# Patient Record
Sex: Male | Born: 1970 | Race: Black or African American | Hispanic: No | Marital: Married | Smoking: Never smoker
Health system: Southern US, Community
[De-identification: ages and names within clinical notes are randomized; demographics above are authoritative.]

## PROBLEM LIST (undated history)

## (undated) DIAGNOSIS — E669 Obesity, unspecified: Secondary | ICD-10-CM

## (undated) DIAGNOSIS — N529 Male erectile dysfunction, unspecified: Secondary | ICD-10-CM

## (undated) DIAGNOSIS — IMO0002 Reserved for concepts with insufficient information to code with codable children: Secondary | ICD-10-CM

## (undated) DIAGNOSIS — I340 Nonrheumatic mitral (valve) insufficiency: Secondary | ICD-10-CM

## (undated) DIAGNOSIS — Z9119 Patient's noncompliance with other medical treatment and regimen: Secondary | ICD-10-CM

## (undated) DIAGNOSIS — R0789 Other chest pain: Secondary | ICD-10-CM

## (undated) DIAGNOSIS — E1165 Type 2 diabetes mellitus with hyperglycemia: Secondary | ICD-10-CM

## (undated) DIAGNOSIS — I4891 Unspecified atrial fibrillation: Secondary | ICD-10-CM

## (undated) DIAGNOSIS — G4733 Obstructive sleep apnea (adult) (pediatric): Secondary | ICD-10-CM

## (undated) DIAGNOSIS — Z7901 Long term (current) use of anticoagulants: Secondary | ICD-10-CM

## (undated) DIAGNOSIS — I10 Essential (primary) hypertension: Secondary | ICD-10-CM

## (undated) DIAGNOSIS — E0590 Thyrotoxicosis, unspecified without thyrotoxic crisis or storm: Secondary | ICD-10-CM

## (undated) DIAGNOSIS — I480 Paroxysmal atrial fibrillation: Secondary | ICD-10-CM

## (undated) DIAGNOSIS — D649 Anemia, unspecified: Secondary | ICD-10-CM

## (undated) DIAGNOSIS — I5042 Chronic combined systolic (congestive) and diastolic (congestive) heart failure: Secondary | ICD-10-CM

## (undated) DIAGNOSIS — I110 Hypertensive heart disease with heart failure: Secondary | ICD-10-CM

## (undated) DIAGNOSIS — Z8619 Personal history of other infectious and parasitic diseases: Secondary | ICD-10-CM

## (undated) DIAGNOSIS — IMO0001 Reserved for inherently not codable concepts without codable children: Secondary | ICD-10-CM

## (undated) DIAGNOSIS — I428 Other cardiomyopathies: Secondary | ICD-10-CM

## (undated) HISTORY — PX: KNEE SURGERY: SHX244

## (undated) HISTORY — DX: Other chest pain: R07.89

## (undated) HISTORY — DX: Obstructive sleep apnea (adult) (pediatric): G47.33

## (undated) HISTORY — DX: Anemia, unspecified: D64.9

## (undated) HISTORY — DX: Type 2 diabetes mellitus with hyperglycemia: E11.65

## (undated) HISTORY — DX: Chronic combined systolic (congestive) and diastolic (congestive) heart failure: I50.42

## (undated) HISTORY — DX: Obesity, unspecified: E66.9

## (undated) HISTORY — DX: Reserved for inherently not codable concepts without codable children: IMO0001

## (undated) HISTORY — DX: Hypertensive heart disease with heart failure: I11.0

## (undated) HISTORY — DX: Other cardiomyopathies: I42.8

## (undated) HISTORY — DX: Nonrheumatic mitral (valve) insufficiency: I34.0

## (undated) HISTORY — DX: Long term (current) use of anticoagulants: Z79.01

## (undated) HISTORY — DX: Paroxysmal atrial fibrillation: I48.0

## (undated) HISTORY — DX: Male erectile dysfunction, unspecified: N52.9

## (undated) HISTORY — DX: Personal history of other infectious and parasitic diseases: Z86.19

## (undated) HISTORY — DX: Thyrotoxicosis, unspecified without thyrotoxic crisis or storm: E05.90

## (undated) HISTORY — DX: Reserved for concepts with insufficient information to code with codable children: IMO0002

## (undated) HISTORY — DX: Patient's noncompliance with other medical treatment and regimen: Z91.19

---

## 2000-06-20 HISTORY — PX: HERNIA REPAIR: SHX51

## 2002-02-08 ENCOUNTER — Ambulatory Visit (HOSPITAL_BASED_OUTPATIENT_CLINIC_OR_DEPARTMENT_OTHER): Admission: RE | Admit: 2002-02-08 | Discharge: 2002-02-08 | Payer: Self-pay | Admitting: General Surgery

## 2002-06-20 DIAGNOSIS — I5042 Chronic combined systolic (congestive) and diastolic (congestive) heart failure: Secondary | ICD-10-CM

## 2002-06-20 HISTORY — PX: CARDIAC CATHETERIZATION: SHX172

## 2002-06-20 HISTORY — DX: Chronic combined systolic (congestive) and diastolic (congestive) heart failure: I50.42

## 2002-11-21 ENCOUNTER — Encounter: Payer: Self-pay | Admitting: Emergency Medicine

## 2002-11-22 ENCOUNTER — Encounter: Payer: Self-pay | Admitting: Cardiology

## 2002-11-22 ENCOUNTER — Encounter (INDEPENDENT_AMBULATORY_CARE_PROVIDER_SITE_OTHER): Payer: Self-pay | Admitting: Cardiology

## 2002-11-22 ENCOUNTER — Inpatient Hospital Stay (HOSPITAL_COMMUNITY): Admission: EM | Admit: 2002-11-22 | Discharge: 2002-11-26 | Payer: Self-pay | Admitting: Cardiology

## 2002-12-03 ENCOUNTER — Encounter: Admission: RE | Admit: 2002-12-03 | Discharge: 2003-03-03 | Payer: Self-pay | Admitting: Cardiology

## 2004-10-28 ENCOUNTER — Inpatient Hospital Stay (HOSPITAL_COMMUNITY): Admission: EM | Admit: 2004-10-28 | Discharge: 2004-11-03 | Payer: Self-pay | Admitting: Emergency Medicine

## 2004-10-28 ENCOUNTER — Encounter (INDEPENDENT_AMBULATORY_CARE_PROVIDER_SITE_OTHER): Payer: Self-pay | Admitting: Cardiology

## 2006-10-31 ENCOUNTER — Encounter: Admission: RE | Admit: 2006-10-31 | Discharge: 2006-10-31 | Payer: Self-pay | Admitting: Cardiology

## 2009-03-17 ENCOUNTER — Emergency Department (HOSPITAL_COMMUNITY): Admission: EM | Admit: 2009-03-17 | Discharge: 2009-03-17 | Payer: Self-pay | Admitting: Family Medicine

## 2010-09-24 LAB — POCT I-STAT, CHEM 8
BUN: 15 mg/dL (ref 6–23)
Chloride: 103 mEq/L (ref 96–112)
HCT: 45 % (ref 39.0–52.0)
Potassium: 3.9 mEq/L (ref 3.5–5.1)
Sodium: 140 mEq/L (ref 135–145)

## 2010-11-05 NOTE — H&P (Signed)
NAME:  Johnathan Archer, Johnathan Archer        ACCOUNT NO.:  [id number]   MEDICAL RECORD NO.:  [id number]          PATIENT TYPE:  EMS   LOCATION:  MAJO                         FACILITY:  [facility]   PHYSICIAN:  Sherin Quarry, MD      DATE OF BIRTH:  [birth year]   DATE OF ADMISSION:  10/28/2004  DATE OF DISCHARGE:                                HISTORY & PHYSICAL   HISTORY OF PRESENT ILLNESS:  Johnathan Archer is a 40 year old man who  presents to [facility] at this time with a 7-day history of a  productive cough.  The cough is productive of copious amounts of yellowish  phlegm. In addition, he has been experiencing fever, malaise, and increased  shortness of breath. On arrival to the ER, his white count was noted to be  14,700. A chest x-ray was obtained which reveals a left lower lobe  retrocardiac infiltrate consistent with pneumonia. Mr. Kalas past  history is very significant. He was hospitalized in June 2004 after  presenting to the [facility] walk-in clinic with increased shortness of breath  and peripheral edema. During the course of that hospitalization, he was seen  in consultation by Dr. Donnie Aho. An echocardiogram was obtained which showed  severe left ventricular dysfunction with ejection fraction of 15%. In  addition, the patient was noted to have evidence of diabetes with a glucose  of 207. On June 7, a cardiac catheterization was done which showed normal  coronary arteries and an ejection fraction of 15%. The patient was diuresed  with Lasix and lost about 20 pounds during the course of his  hospitalization. Eventually, he was discharged on a regimen of Coreg 3.125  mg b.i.d, Lasix 40 mg b.i.d., and Altace 10 mg daily. In addition, he was  placed on Glucotrol XR 5 mg daily. I am unable to determine from the patient  how long he took these medications. He does not remember the names of the  medicines nor does he remember how he took them.  All he will say is that it  has been  a long time or many months since he has taken any medications.   CURRENT MEDICATIONS:  None.   ALLERGIES:  No known drug allergies.   OPERATIONS:  In addition to the above-mentioned cardiac catheterization, he  has had a hernia repair.   MEDICAL ILLNESSES:  Otherwise, none.   FAMILY HISTORY:  Remarkable for diabetes in the patient's mother. His sister  is in good health. His father apparently drowned.   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 He says that he drinks alcohol very rarely and does  not smoke cigarettes. He has one small child at home who apparently is well.   REVIEW OF SYSTEMS:  HEAD: He denies headache or dizziness. EYES: He denies  visual blurring or diplopia. EAR/NOSE/THROAT: Denies earache, sinus pain, or  sore throat. CHEST: See above. There has been no chest pain. CARDIOVASCULAR:  He denies orthopnea. He says he is not having any ankle edema. GI: Denies  nausea, vomiting, or abdominal pain. GU: Denies dysuria or urinary  frequency. NEUROLOGIC: There is no history of seizure or stroke.  ENDOCRINOLOGY: Denies excessive thirst. He states he is not urinating  frequently. He denies nocturia.   PHYSICAL EXAMINATION:  GENERAL: He is a cooperative man who is coughing  repeatedly. Cough is productive of copious amounts of yellowish phlegm.  VITAL SIGNS: His temperature was initially 102.4. Now it is down to 99.4.  His pulse is 108, respirations 20, O2 saturations 92% on room air.  HEENT: Within normal limits.  CHEST: Diminished breath sounds at the bases. I hear rhonchi about 1/3 of  the way up on the left. There is moderate expiratory wheezing.  CARDIOVASCULAR: Reveals normal S1-S2. There are no rubs, murmurs or gallops.  ABDOMEN: Benign, normal bowel sounds without masses, tenderness, or  organomegaly.  NEUROLOGIC: Within normal limits.  EXTREMITIES: No evidence of cyanosis or edema.   LABORATORY STUDIES:   Hemoglobin 13.1, hematocrit 38.9, white count of 14.7.  Sodium was 135, potassium 4.0, CO2 is 27, glucose was 255, creatinine 1.3.  Liver profile was normal.   IMPRESSION:  1.  Pneumonia.  2.  Uncontrolled diabetes secondary to noncompliance.  3.  History of congestive heart failure noncompliant with medical advice.   PLAN:  The patient will be admitted to the hospital. Will start him on IV  Zithromax and Rocephin. Nebulizer therapy and oxygen will be administered.  Blood and sputum cultures will be obtained. In regard to his diabetes, I  will place him on a sliding scale insulin regimen. Will also give him a  daily dosage of Lantus insulin. Hemoglobin A1c will be obtained. Ultimately,  will switch him back over to oral regimen In regard to his congestive heart  failure, I am going to repeat his 2-D echo and BNP, and put him back on  Altace and Coreg. I am also going to get a urine for protein. This man is  very strongly in need of ongoing medical care and I am going to try to  encourage him as much as possible to return to see Dr. Kevan Ny. It is not  clear to me when he last saw her.      SY/MEDQ  D:  10/28/2004  T:  10/28/2004  Job:  [id number]   cc:   Duncan Dull, M.D.  [address]  [place]  [place] [address]  Fax: [phone]   W. Ashley Royalty., M.D.  [address]  [place]  [place] [address]  Fax: [phone]

## 2010-11-05 NOTE — H&P (Signed)
NAME:  Johnathan Archer, Johnathan Archer                  ACCOUNT NO.:  [id number]   MEDICAL RECORD NO.:  [id number]                   PATIENT TYPE:  EMS   LOCATION:  ED                                   FACILITY:  [facility]   PHYSICIAN:  Darden Palmer., M.D.         DATE OF BIRTH:  [birth year]   DATE OF ADMISSION:  11/21/2002  DATE OF DISCHARGE:                                HISTORY & PHYSICAL   REASON FOR ADMISSION:  Shortness of breath and pedal edema.   HISTORY OF PRESENT ILLNESS:  This very pleasant 40 year old black male has  previously been in good health except for obesity. He is seen normally by  Dr. Kevan Ny on a sporadic basis but states that he does not have any prior  medical problems. He had talked to her about evaluation for sleep apnea but  had previously been feeling well. 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 He and his wife had a little  daughter born approximately three weeks ago. He developed an upper  respiratory infection and had been taking Amoxicillin for this. He presented  to the [facility] this evening with a two week history of  progressive peripheral edema, PND, orthopnea and dyspnea on exertion. He had  a slight cough. He had no significant chest pain. He presented to [facility] and had significant edema and was sent to the emergency room where  he was found to have fairly marked cardiomegaly. He also is hypertensive and  somewhat tachycardiac. I was asked to see him and he is admitted at this  time for treatment of congestive heart failure to rule out myocarditis or  pericardial effusion.   PAST MEDICAL HISTORY:  Fairly benign. He has not had his blood pressure  checked in some time and is hypertensive in the emergency room but denies  any prior history of hypertension, diabetes, lipid disorder or other medical  problems.   PAST SURGICAL HISTORY:  Knee surgery in high school. He had an umbilical  hernia repair sometime last year.   ALLERGIES:  None.   CURRENT MEDICATIONS:  Amoxicillin.   FAMILY HISTORY:  Father died accidentally. Mother is in her 53's. He has a  sister who is in good health. There is no family history of premature  cardiac disease.   SOCIAL HISTORY:  He has been married for four years. 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 He has been employed there since April.  His wife is a Midwife at [employer]. He is a non-smoker.  He does not use alcohol to excess. There is no history of illicit drug use.  They have one daughter who is about three weeks  old.   REVIEW OF SYSTEMS:  He has been obese his adult life. He wears glasses. He  has no eye, ear, nose, or throat problems normally. He has not had any  recent leg injury or pain involving his lower extremities. He has had  previous knee surgery. He has urinary frequency. He denies abdominal pain  but has had some increased abdominal girth recently. He has no genitourinary  problems other than as noted above. The remainder of the review of systems  is unremarkable.   PHYSICAL EXAMINATION:  GENERAL: He is an obese male who appears mildly  anxious. VITAL SIGNS: His blood pressure by me was 150/110 with no pulses  paradox noted. Pulse is 120 and regular in sinus tachycardia. SKIN: Was warm  and dry. ENT: His head is shaved. He wears glasses. EOMI. PERLA. CNS clear.  Fundi unremarkable. Pharynx negative. NECK: Supple without masses. There may  be slight jugular venous distention noted. LUNGS: Reveal faint bibasilar  rales. CARDIAC: Exam shows a gallop rhythm. There is no definite murmur  noted. ABDOMEN: Is obese, distended, and soft. EXTREMITIES: There is 3+  peripheral edema with 2+ peripheral pulses.  NEURO:  Normal.   DIAGNOSTIC IMPRESSION:  12 lead EKG showed sinus tachycardia with T wave  changes in the lateral leads. Chest x-ray showed marked cardiomegaly.   LABORATORY DATA:  White count 6,500. Hemoglobin 14.8 and hematocrit 44.  Normal indices. Platelet count 250,000. Sodium is 140, potassium 3.6,  chloride 108, CO2 29, glucose 241, BUN 10, creatinine 1.3. Albumin 3.1. SGOT  58, GPT 121, alkaphos 35. CPK was 331 with 9.9 units of MB. Troponin was  0.05. Brain natriuretic peptide is 953. Protime is 14.2 seconds with an INR  of 1.1. Urinalysis shows a glucose of 500 and 30 mg of protein.   IMPRESSION:  1. Differential would include congestive heart failure due to cardiomyopathy     or myocarditis, pericardial effusion, silent ischemic heart disease.  2. Possible history of sleep apnea.  3. Elevation of blood pressure without previous evidence of hypertension.  4. Elevation of blood sugar without previous diagnosis of diabetes.  5. Morbid obesity.  6. Peripheral edema, probably due to heart failure, rule out due to deep     venous thrombosis.   RECOMMENDATIONS:  The patient will be transferred to [facility] for  admission to the ICU or step-down unit. He will be diuresed with Lasix and  started on Altace. We will also put nitroglycerin paste on him. He will have  fasting blood sugar evaluated in the morning and be placed on a carbohydrate  modified diet. Until we know whether he has a pericardial effusion, I will  hold anticoagulation but will need to start this soon. Obtain echocardiogram  in the morning. Further prognostication about his diagnosis and treatment  plan will need to be done when we have had a chance to further assess his  status.                                                Darden Palmer., M.D.    WST/MEDQ  D:  11/22/2002  T:  11/22/2002  Job:  [id number]   cc:   Duncan Dull, M.D. [address]  [place]  [place] [address]  Fax: [phone]

## 2010-11-05 NOTE — Cardiovascular Report (Signed)
   NAME:  Johnathan Archer, Johnathan Archer                  ACCOUNT NO.:  [id number]   MEDICAL RECORD NO.:  [id number]                   PATIENT TYPE:  INP   LOCATION:  2901                                 FACILITY:  [facility]   PHYSICIAN:  Darden Palmer., M.D.         DATE OF BIRTH:  1970-11-10   DATE OF PROCEDURE:  11/25/2002  DATE OF DISCHARGE:  11/26/2002                              CARDIAC CATHETERIZATION   HISTORY:  A 41 year old male who presented with severe cardiomyopathy.  He  is a diabetic and also hypertensive.  Study is done to evaluate for coronary  artery disease.   PROCEDURE:  Left heart catheterization with coronary angiograms and left  ventriculogram.   COMMENTS ABOUT PROCEDURE:  The patient tolerated the procedure well without  complications.  Following the procedure he had good hemostasis and good  peripheral pulses noted.   HEMODYNAMIC DATA:  1. Aorta post contrast 122/22-35.  2. LV post contrast 122/90.   ANGIOGRAPHIC DATA:  Left ventriculogram performed in the 30 degree RAO  projection.  The aortic valve is normal.  The mitral valve is normal.  Left  ventricle was dilated with severe diffuse hypokinesis noted.  The estimated  ejection fraction is 15%.  Coronary arteries arise and distribute normally.   Left main coronary artery is normal.   Left anterior descending normal.   Circumflex coronary artery normal.   Right coronary artery normal.   IMPRESSION:  1. Severe diffuse hypokinesis with normal coronary arteries compatible with     a cardiomyopathy.  2.     Increased left ventricular end-diastolic pressure.  3. No significant coronary artery disease identified.   PLAN:  Continue treatment for cardiomyopathy.                                               Darden Palmer., M.D.    WST/MEDQ  D:  02/19/2003  T:  02/19/2003  Job:  [id number]   cc:   Duncan Dull, M.D.  [address]  [place]  [place] [address]  Fax: [phone]

## 2010-11-05 NOTE — Consult Note (Signed)
NAMECAROLINA, ANTOLIN        ACCOUNT NO.:  [id number]   MEDICAL RECORD NO.:  [id number]          PATIENT TYPE:  INP   LOCATION:  6703                         FACILITY:  [facility]   PHYSICIAN:  W. Ashley Royalty., M.D.DATE OF BIRTH:  [birth year]   DATE OF CONSULTATION:  DATE OF DISCHARGE:                                   CONSULTATION   Thank you for asking me to see this 40 year old black male for evaluation of  possible ventricular tachycardia and cardiomyopathy.  The patient is well  known to me.  He was diagnosed with an idiopathic cardiomyopathy in June of  2004 when he had congestive heart failure with an ejection fraction of 10-  15%.  At that time, he had normal coronary arteries.  He was treated with  maximum medical therapy with Altace, aspirin, Coreg 25 mg b.i.d., Inspra,  potassium, Norvasc and Lasix.  He also had a new diagnosis of diabetes.  He  clinically improved to the point where he was asymptomatic and was last seen  by me in November of 2004.  He failed to return for followup echocardiogram,  had gotten off a number of his medicines and had really not had any medical  care.  He, however, had been asymptomatic and had been able to play  basketball and be involved in normal activities.  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  He was  admitted to the hospital with a seven-day history of cough with yellow  sputum, fever and malaise and was found to have pneumonia.  He was admitted  and has been treated for that and has been feeling fine.  He had an  echocardiogram done during this hospitalization that showed an ejection  fraction of 30-35% with mild to moderate mitral regurgitation.  This  represented an improvement from his previous echocardiogram.  His BNP level  was only 186.   PAST MEDICAL HISTORY:  His past history is remarkable  for:  1.  Noninsulin-dependent diabetes mellitus.  2.  Obesity.  3.  Hypertension.  4.  Prior history of sleep apnea that has never really been worked up.   PREVIOUS SURGERY:  1.  Arthroscopy knee surgery.  2.  Umbilical hernia repair.   MEDICATIONS:  His current medications that he is on in the hospital include:  1.  Sliding scale insulin.  2.  Glucotrol 5 mg b.i.d.  3.  Metformin 500 mg b.i.d.  4.  Altace 10 mg daily.  5.  Zithromax 250 mg daily.  6.  Ceftin b.i.d.  7.  Lasix 20 mg daily.  8.  Clonidine 0.2 mg patch.  9.  Metoprolol 50 mg b.i.d.   FAMILY HISTORY:  Remarkable for diabetes in the patient's mother.  A sister  in good health.  His father drowned.   SOCIAL HISTORY:  He has a wife and has a daughter at home who is 2 years  old.  He is a nonsmoker and does not really use alcohol to excess.  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   REVIEW OF SYSTEMS:  He has modestly obese.  He has no skin problems.  He has  no eye, ear, nose or throat symptoms.  He has no lymph node symptoms or  hematologic problems.  He has no chest pain suggestive of angina.  He has  had no PND, orthopnea or edema.  He has no nausea, vomiting, abdominal pain  or history of hiatal hernia or reflux.  He has no urinary frequency or  dysuria.  He has no history of seizure or stroke.  Other than as noted  above, the remainder of the review of systems is unremarkable.   PHYSICAL EXAMINATION:  GENERAL APPEARANCE:  He is an obese male whose head  is shaved.  He is currently in no acute distress.  VITAL SIGNS:  His blood pressure is currently 158/93.  Oxygen saturation is  92% on room air.  Pulse was 100 and regular.  SKIN:  Warm and dry.  HEENT:  EOMI.  PERRL.  CNS clear.  Fundi not examined.  Pharynx negative.  NECK:  Supple without masses, JVD, thyromegaly or bruits.  LUNGS:  Clear to A&P.  CARDIAC:  Normal S1 and S2.  No S3, S4 or murmur.  ABDOMEN:   Soft and nontender.  No hepatosplenomegaly or masses.  EXTREMITIES:  Femoral and distal pulses 2+.  Trace edema noted.   LABORATORY DATA:  His 12-lead EKG shows normal sinus rhythm and voltage for  LVH.  Echocardiogram shows an EF of 30-35%.  Chest x-ray shows pneumonia in  the left lower lobe.  He had a hemoglobin A1C of 8.8.  Renal function was  normal.   Telemetry strips were reviewed that reportedly showed ventricular  tachycardia, which is the reason I was consulted.  I reviewed multiple  telemetry strips present from Nov 01, 2004, and Nov 02, 2004.  I would  interpreted these strips as showing artifact.  In particular, there is no  compensatory pulse following that and there is significant baseline artifact  during the reported ventricular tachycardia.  I do not feel that this is  consistent with ventricular tachycardia at this point in time.   IMPRESSION:  1.  Telemetry strips that show artifact rather than ventricular tachycardia.  2.  History of dilated cardiomyopathy with normal coronary arteries.  3.  Medical noncompliance.  4.  Hypertension which is still not well controlled.  5.  Diabetes mellitus with noncompliance.  6.  Obesity.   RECOMMENDATIONS:  At the present time, the patient needs no further  treatment for arrhythmia.  He does have a dilated cardiomyopathy and  residual ventricular dysfunction likely due to residual cardiomyopathy, but  may also be exacerbated by uncontrolled hypertension.  My recommendations  would be to have good blood pressure control with a blood pressure of less  than 120 systolic.  To achieve this, he will likely need maximum doses of  multiple blood pressure medicines.  He evidently does have insurance and  will need to be compliant.  He also will need to lose weight.  My  recommendations would be, particularly since his African American, to  initiate hydralazine, as well as nitrates.  I would started with hydralazine 25 mg t.i.d., as  well as isosorbide 10 mg t.i.d.  In addition, I would  change him on lisinopril and add aldactone to his regimen and use diuretics  to help control blood pressure.  He  should be on maximum doses of beta blocker.  He previously was on Coreg 25  mg b.i.d.  He also will need to have treatment for diabetes.  I would like  to see him back in followup in the office.  I appreciate seeing this nice  man with you.       WST/MEDQ  D:  11/02/2004  T:  11/02/2004  Job:  [id number]   cc:   Duncan Dull, M.D.  [address]  [address]  [place]  [place] [address]  Fax: [phone]

## 2010-11-05 NOTE — Discharge Summary (Signed)
NAME:  Johnathan Archer, Johnathan Archer                  ACCOUNT NO.:  [id number]   MEDICAL RECORD NO.:  [id number]                   PATIENT TYPE:  INP   LOCATION:  2901                                 FACILITY:  [facility]   PHYSICIAN:  Darden Palmer., M.D.         DATE OF BIRTH:  08/24/1970   DATE OF ADMISSION:  11/22/2002  DATE OF DISCHARGE:  11/26/2002                                 DISCHARGE SUMMARY   DISCHARGE DIAGNOSES:  1. 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  2. New-onset diabetes mellitus.  3. Obesity.  4. History of sleep apnea.  5. Hypertension.   PROCEDURES:  1. Cardiac catheterization.  2. Echocardiogram.   HISTORY OF PRESENT ILLNESS:  This 40 year old, African-American male had  previously been in good health except for obesity.  He had talked to Dr.  Shaune Pollack on a sporadic basis about having evaluation for sleep apnea, but  this was never done.  He developed an upper respiratory infection and had  been taking amoxicillin for this.  He [facility] to the [facility]  the day of admission with a two-week history of progressive peripheral  edema, PND, orthopnea and dyspnea on exertion.  He had a slight cough.  He  had no significant chest pain.  He had marked cardiomegaly and was  hypertensive, tachycardic and was admitted to rule out a myocardial  infarction and to evaluate for congestive heart failure.  Please see the  previously dictated History and Physical for remainder of the details.   LABORATORY DATA AND X-RAY FINDINGS:  On admission, hemoglobin 15.5,  hematocrit 45.5.  Sodium 143, potassium 3.4, chloride 96, CO2 40, glucose  207 on admission, BUN 11, creatinine 1.4.  CPK 322 on admission with 8.3  units of MB and troponin 0.04.  Next CPK was 229, MB 7.2, relative index  3.1.  Hemoglobin A1C was 7.6 consistent with diabetes.   HOSPITAL COURSE:  He was admitted to the hospital and placed on  therapy with  heparin.  He had a loud gallop rhythm noted and had significant edema.  He  was initiated with therapy with Coreg, Inspra and ACE inhibitors.  Echocardiogram showed severe left ventricular dysfunction with an estimated  ejection fraction of 15%.  He was seen by the diabetic coordinator and had  diabetic instruction and was given an appointment to see the outpatient  nutrition center.  He had some left calf pain and had some mild fever also.  He was diuresed over the weekend and remained stable.  He was taken to the  cardiac catheterization laboratory on November 25, 2002, with normal coronary  arteries and had an ejection fraction of about 15%.  His catheterization  site was stable.  He had diuresed from 253 down to 233 and tolerated his  medicines well.  He was discharged in improved condition.  He was given  diabetic instruction.  He had venous  Dopplers that did not show any evidence  of DVT.   CONDITION ON DISCHARGE:  Improved.   DISCHARGE MEDICATIONS:  1. Coreg 3.125 mg b.i.d.  2. Lasix 40 mg b.i.d.  3. K-Dur 20 mEq daily.  4. Inspra 25 mg a day.  5. Altace 10 mg a day.  6. Glucotrol XR 5 mg daily.  7. Norvasc 5 mg daily.  8. Baby aspirin 81 mg daily.   SPECIAL INSTRUCTIONS:  A 24-hour urine was collected for VMA, catecholamines  and metanephrines to determine if there were any problems here.   FOLLOW UP:  He was to call the office to be seen for an appointment in one  week and is to call if there are problems.                                                Darden Palmer., M.D.    WST/MEDQ  D:  01/18/2003  T:  01/18/2003  Job:  [id number]   cc:   Duncan Dull, M.D.  [address]  [place]  [place] [address]  Fax: [phone]

## 2010-11-05 NOTE — Discharge Summary (Signed)
Johnathan Archer, Johnathan Archer        ACCOUNT NO.:  [id number]   MEDICAL RECORD NO.:  [id number]          PATIENT TYPE:  INP   LOCATION:  6703                         FACILITY:  [facility]   PHYSICIAN:  Melissa L. Ladona Ridgel, MD  DATE OF BIRTH:  1971/02/25   DATE OF ADMISSION:  10/28/2004  DATE OF DISCHARGE:  11/03/2004                                 DISCHARGE SUMMARY   DISCHARGE DIAGNOSES:  1.  Left lower lobe pneumonia.  The patient has responded favorably to      antibiotic therapy and will complete a full 10-day course of      antibiotics.  He has four days left of Ceftin 500 mg p.o. b.i.d.  2.  Sleep apnea.  The patient has a previous history of sleep apnea but      never went for testing, we will recommend that he follow up with Dr.      Kevan Ny to obtain referral for a sleep study.  3.  Idiopathic dilated cardiomyopathy.  The patient was diagnosed back in      2004 with a dilated idiopathic cardiomyopathy and ejection fraction at      that time was 15%.  He underwent catheterization where he was found to      have no active intracoronary disease.  Therefore, this was deemed      secondary to likely viral etiology.  The patient did not follow up with      his after care and has been noncompliant with both diabetic and cardiac      medications for some time.  At this time, we have reevaluated his      ejection fraction which appears to be 30% and he has been re-seen by Dr.      Viann Fish who will follow him as an outpatient.  The patient's      cardiomyopathy medications have been reinstituted and 90 days and 30      days prescriptions have been provided.  The importance of following up      with Dr. Donnie Aho has been emphasized by both Dr. Donnie Aho and myself.  4.  Diabetes.  The patient has been noncompliant with his diabetic care,      including checking his blood sugars.  His hemoglobin A1c is noted to be      8.8.  I have reviewed this with the patient and have discussed the fact  that at this time his therapy warrants not only oral therapy but Lantus      insulin therapy.  I expressed to him that if he wishes to be free of      insulin that he needs to be faithful to his oral medications and      consider a weight loss plan and adhere strictly to a modified diet.  At      this time, I feel strongly that the patient should be discharged to home      on Lantus with followup with his primary care physician with a goal      potentially to see if the patient will tolerate only oral therapy in the  future.  We have provided a Lantus pen as well as battery and strips for      his monitor.   DISCHARGE MEDICATIONS:  1.  Hydralazine 50 mg b.i.d.  2.  Isosorbide 20 mg b.i.d.  3.  Lisinopril 40 mg daily.  4.  Lasix 40 mg daily.  5.  Aldactone 25 mg daily.  6.  Coreg 25 mg b.i.d.  7.  Lantus 30 units subcutaneous q.h.s.  8.  Glucophage 500 mg b.i.d.  9.  Glucotrol XR 5 mg two tablets daily.  10. Ceftin 500 mg p.o. b.i.d. x4 more days.   The patient may return to work on Sunday, Nov 07, 2004.   Dietary restrictions are carbohydrate modified medium and the patient  assures me that he has had previous training and understands the appropriate  number of carbohydrates and the mechanism for counting said carbohydrates.   FOLLOWUP:  The patient is to call Dr. York Spaniel office and schedule for 2  weeks and his phone number is [phone].  I have asked him to schedule with  Dr. Shaune Pollack in 1-2 weeks to obtain a sleep study and follow up on his  cholesterol levels.  Cholesterol levels were ordered, however, the values  are not available at the time of discharge.   HISTORY OF PRESENT ILLNESS:  The patient is a 40 year old African-American  male who presented to the emergency department on Oct 28, 2004 with a 7-day  history of productive cough.  The patient states that he had been  experiencing fever, malaise and shortness of breath and yellowish colored  phlegm.  A chest  x-ray revealed left lower lobe infiltrate consistent with  pneumonia.  The patient was therefore admitted to the hospital for treatment  of said pneumonia.   HOSPITAL COURSE:  During the course of the hospital stay the patient  responded favorably to IV and oral antibiotics.  It was noted, however, that  he had a persistent low-grade tachycardia.  He was placed on telemetry for  further evaluation of the tachycardia and found to have occasional pausing  with bradycardia and some factitious areas on the EKG that appeared as VT  but were deemed to be artifact.  The patient received a consult by Dr.  Viann Fish in order to reestablish care of his cardiomyopathy.  The  patient's course was otherwise unremarkable and on the day of discharge the  following physical exam was noted:   PHYSICAL EXAMINATION:  VITAL SIGNS:  Temperature 98, blood pressure 151/96,  heart rate 93-106, respirations 20, saturations 91-94% on room air.  GENERAL:  This is a well-developed, moderately obese African-American male  in no acute distress.  HEENT:  Pupils are equal, round and reactive to light, extraocular movements  are intact.  Mucous membranes are moist.  NECK:  Supple, there is no JVD, no lymphadenopathy and no carotid bruits.  CHEST:  Clear to auscultation, there are no rhonchi, rales or wheezes.  CARDIOVASCULAR:  Regular rate and rhythm, positive S1 and S2, no S3 or S4.  No murmurs, rubs or gallops are noted.  ABDOMEN:  Soft, nontender, non-distended with positive bowel sounds.  EXTREMITIES:  No clubbing, cyanosis or edema.  NEUROLOGIC:  Nonfocal.   PERTINENT LAB VALUES:  TSH 0.437, within normal limits.  Hemoglobin A1c 8.8  which is high.  BNP 186.8.  Urine culture remains pending.  Blood cultures  have remained negative during the course of this stay.  His discharging  hemoglobin is 12.6, hematocrit 36.5,  white count 7.9, and platelets 357,000. Discharging BUN is 10, creatinine 1.1, potassium  3.8.  CBGs over the last 24  hours reveal blood sugar in the a.m. of 171, last p.m. 132, 122 and 160.   At this time, the patient has been stable for discharge, to continue his  therapy for left lower lobe pneumonia at home and to follow up with Dr.  Viann Fish regarding his cardiomyopathy as well as to follow up with his  primary care physician for closer management of his diabetes and other  medical conditions.   CONDITION ON DISCHARGE:  Stable.      MLT/MEDQ  D:  11/03/2004  T:  11/03/2004  Job:  [id number]   cc:   Darden Palmer., M.D.  [address], [address]  [place]  [place] [address]  Fax: [phone]   Duncan Dull, M.D.  [address]  [address]  [place]  [place] [address]  Fax: [phone]

## 2010-11-05 NOTE — Op Note (Signed)
NAME:  Johnathan Archer, Johnathan Archer                  ACCOUNT NO.:  [id number]   MEDICAL RECORD NO.:  [id number]                   PATIENT TYPE:  AMB   LOCATION:  DSC                                  FACILITY:  [facility]   PHYSICIAN:  Anselm Pancoast. Zachery Dakins, M.D.          DATE OF BIRTH:  04/11/1971   DATE OF PROCEDURE:  02/08/2002  DATE OF DISCHARGE:  02/08/2002                                 OPERATIVE REPORT   PREOPERATIVE DIAGNOSES:  1. Umbilical hernia.  2. Obesity.   POSTOPERATIVE DIAGNOSES:  1. Umbilical hernia.  2. Obesity.   PROCEDURE PERFORMED:  Repair of umbilical hernia with mesh.   ANESTHESIA:  General.   SURGEON:  Anselm Pancoast. Zachery Dakins, M.D.   INDICATIONS FOR PROCEDURE:  The patient is a 40 year old male who is very  stocky and has developed an umbilical hernia over the past several months.  I saw him in the office and he has had no symptoms as far as incarceration  or severe pain, but this is definitely increasing in size and I recommended  repair of this with mesh reinforcement.  He works for the [employer] and  is not working for the next week, and he understands that he will be on  light duty for approximately four weeks.   DESCRIPTION OF PROCEDURE:  The patient was taken to the operating suite.  Induction of general anesthesia __________  was used.  The abdomen was then  prepped with Betadine Surgiscrub and solution, and draped in a sterile  manner.  The area with him standing appears to be a little supraumbilical;  with lying on the operating room table it appears to be umbilical.  I made a  vertical incision and then dissected down, identifying the fascial defect  which was approximately the size of a 50-cent piece, at the umbilical area.  I then separated the hernia sac from the surrounding fascia and then  directed and dissected circumferentially so that I could use a piece of mesh  at least 1 to 2 inches up under the fascia in all directions.  I then  removed  the hernia sac and closed the peritoneum after inspecting the  contents and making sure there was no bowel there with an #0 Vicryl suture.  Next, we took a piece of the Prolene mesh, probably about 4 x 3 inches, and  placed it vertically so that it is up under the fascia in all directions and  held with stitches placed at the corners and then between areas of #0  Prolene.  I then closed the fascia vertically with the same #0 Prolene  sutures incorporating a little portion of the mesh in the mid portion to  reinforce this area.  The subcutaneous wounds were then closed with #3-0  Vicryl, trying to recreate an indentation at the umbilicus.  The skin was  then closed with interrupted #4-0 nylon.  The patient tolerated the  procedure nicely.  I did place approximately 20  cc of Marcaine with  adrenaline at the peritoneum and subcutaneous areas to minimize immediate  postoperative pain.                                               Anselm Pancoast. Zachery Dakins, M.D.   WJW/MEDQ  D:  02/08/2002  T:  02/12/2002  Job:  [id number]

## 2011-05-06 ENCOUNTER — Emergency Department (HOSPITAL_COMMUNITY)
Admission: EM | Admit: 2011-05-06 | Discharge: 2011-05-06 | Disposition: A | Discharge status: Other Acute Inpatient Hospital | Payer: Self-pay | Source: Home / Self Care

## 2011-06-24 ENCOUNTER — Other Ambulatory Visit: Payer: Self-pay

## 2011-06-24 ENCOUNTER — Emergency Department (HOSPITAL_COMMUNITY)
Admission: EM | Admit: 2011-06-24 | Discharge: 2011-06-24 | Disposition: A | Discharge status: Home / Self Care | Payer: 59 | Attending: Emergency Medicine | Admitting: Emergency Medicine

## 2011-06-24 DIAGNOSIS — E119 Type 2 diabetes mellitus without complications: Secondary | ICD-10-CM | POA: Insufficient documentation

## 2011-06-24 DIAGNOSIS — I10 Essential (primary) hypertension: Secondary | ICD-10-CM | POA: Insufficient documentation

## 2011-06-24 DIAGNOSIS — M7989 Other specified soft tissue disorders: Secondary | ICD-10-CM | POA: Insufficient documentation

## 2011-06-24 DIAGNOSIS — M25473 Effusion, unspecified ankle: Secondary | ICD-10-CM | POA: Insufficient documentation

## 2011-06-24 DIAGNOSIS — R609 Edema, unspecified: Secondary | ICD-10-CM | POA: Insufficient documentation

## 2011-06-24 DIAGNOSIS — R739 Hyperglycemia, unspecified: Secondary | ICD-10-CM

## 2011-06-24 DIAGNOSIS — I509 Heart failure, unspecified: Secondary | ICD-10-CM | POA: Insufficient documentation

## 2011-06-24 DIAGNOSIS — M25476 Effusion, unspecified foot: Secondary | ICD-10-CM | POA: Insufficient documentation

## 2011-06-24 HISTORY — DX: Essential (primary) hypertension: I10

## 2011-06-24 LAB — CARDIAC PANEL(CRET KIN+CKTOT+MB+TROPI)
Relative Index: 2.6 — ABNORMAL HIGH (ref 0.0–2.5)
Troponin I: <0.3 ng/mL (ref <0.30)

## 2011-06-24 LAB — COMPREHENSIVE METABOLIC PANEL WITH GFR
ALT: 46 U/L (ref 0–53)
AST: 26 U/L (ref 0–37)
Albumin: 2.6 g/dL — ABNORMAL LOW (ref 3.5–5.2)
Alkaline Phosphatase: 61 U/L (ref 39–117)
BUN: 23 mg/dL (ref 6–23)
CO2: 29 meq/L (ref 19–32)
Calcium: 8.7 mg/dL (ref 8.4–10.5)
Chloride: 103 meq/L (ref 96–112)
Creatinine, Ser: 1.12 mg/dL (ref 0.50–1.35)
GFR calc Af Amer: >90 mL/min
GFR calc non Af Amer: 80 mL/min — ABNORMAL LOW
Glucose, Bld: 323 mg/dL — ABNORMAL HIGH (ref 70–99)
Potassium: 4 meq/L (ref 3.5–5.1)
Sodium: 139 meq/L (ref 135–145)
Total Bilirubin: 0.8 mg/dL (ref 0.3–1.2)
Total Protein: 6.1 g/dL (ref 6.0–8.3)

## 2011-06-24 LAB — GLUCOSE, CAPILLARY: Glucose-Capillary: 318 mg/dL — ABNORMAL HIGH (ref 70–99)

## 2011-06-24 LAB — DIFFERENTIAL
Lymphs Abs: 1.4 10*3/uL (ref 0.7–4.0)
Monocytes Relative: 6 % (ref 3–12)
Neutro Abs: 4.8 10*3/uL (ref 1.7–7.7)
Neutrophils Relative %: 71 % (ref 43–77)

## 2011-06-24 LAB — PRO B NATRIURETIC PEPTIDE: Pro B Natriuretic peptide (BNP): 1609 pg/mL — ABNORMAL HIGH (ref 0–125)

## 2011-06-24 LAB — CBC
Hemoglobin: 14.4 g/dL (ref 13.0–17.0)
Platelets: 236 10*3/uL (ref 150–400)
RBC: 4.75 MIL/uL (ref 4.22–5.81)
WBC: 6.7 10*3/uL (ref 4.0–10.5)

## 2011-06-24 MED ORDER — FUROSEMIDE 40 MG PO TABS: 40.0000 mg | ORAL_TABLET | Freq: Every day | ORAL | Status: DC

## 2011-06-24 MED ORDER — METFORMIN HCL 500 MG PO TABS: 500.0000 mg | ORAL_TABLET | Freq: Two times a day (BID) | ORAL | Status: DC

## 2011-06-24 MED ORDER — GLIPIZIDE 10 MG PO TABS: 10.0000 mg | ORAL_TABLET | Freq: Two times a day (BID) | ORAL | Status: DC

## 2011-06-24 NOTE — ED Notes (Signed)
Pt presents with 2 week h/o BLE edema  Pt reports swelling extends up to both knees.  Pt reports 3 week h/o productive cough with clear phlegm, reports shortness of breath.  Pt has h/o HTN and DM, has not been on medication.

## 2011-06-24 NOTE — ED Notes (Signed)
Received report. Pt noted alert and oriented. Skin warm and dry. Resp are unlabored. Hx CHF. Reports sob and lower extremity edema. Has been off meds d/t financial reasons. Appears in no acute distress at this time. Texting on cel phone. Denies needs.

## 2011-06-24 NOTE — ED Provider Notes (Addendum)
History     CSN: [id number]  Arrival date & time 06/24/11  1305   First MD Initiated Contact with Patient 06/24/11 1433      Chief Complaint  Patient presents with  . Leg Swelling    (Consider location/radiation/quality/duration/timing/severity/associated sxs/prior treatment) HPI Comments: Patient has history of chf, dm.  Had been treated with several medications for this in the past, however he was not able to go back to the physician due to financial/insurance reasons.  He has not been on any meds for the past 4 years.  He presents today with swelling of both legs, ankles.  He states he does not feel well.  No cough.  No fever or chills.    Patient is a 41 y.o. male presenting with leg pain.  Leg Pain  The incident occurred more than 2 days ago. The incident occurred at home.    Past Medical History  Diagnosis Date  . Hypertension   . Diabetes mellitus   . CHF (congestive heart failure)     Past Surgical History  Procedure Date  . Hernia repair   . Knee surgery     No family history on file.  History  Substance Use Topics  . Smoking status: Never Smoker   . Smokeless tobacco: Not on file  . Alcohol Use: Yes      Review of Systems  All other systems reviewed and are negative.    Allergies  Review of patient's allergies indicates no known allergies.  Home Medications  No current outpatient prescriptions on file.  BP 171/122  Pulse 112  Temp(Src) 98.2 F (36.8 C) (Oral)  Resp 18  SpO2 96%  Physical Exam  Nursing note and vitals reviewed. Constitutional: He is oriented to person, place, and time. He appears well-developed and well-nourished.  HENT:  Head: Normocephalic and atraumatic.  Eyes: Pupils are equal, round, and reactive to light.  Neck: Normal range of motion.  Cardiovascular: Normal rate and regular rhythm.  Exam reveals no friction rub.   No murmur heard. Pulmonary/Chest: Effort normal and breath sounds normal. No respiratory distress.  He has no wheezes. He has no rales.  Abdominal: Soft. Bowel sounds are normal. He exhibits no distension. There is no tenderness.  Musculoskeletal: Normal range of motion. He exhibits edema.       2-3+ ble edema noted.  Neurological: He is alert and oriented to person, place, and time.  Skin: Skin is warm and dry.    ED Course  Procedures (including critical care time)  Labs Reviewed  GLUCOSE, CAPILLARY - Abnormal; Notable for the following:    Glucose-Capillary 318 (*)    All other components within normal limits  CBC  DIFFERENTIAL  COMPREHENSIVE METABOLIC PANEL  PRO B NATRIURETIC PEPTIDE  CARDIAC PANEL(CRET KIN+CKTOT+MB+TROPI)   No results found.   No diagnosis found.   Date: 06/24/2011  Rate: 106  Rhythm: sinus tachycardia  QRS Axis: normal  Intervals: normal  ST/T Wave abnormalities: nonspecific ST changes  Conduction Disutrbances:none  Narrative Interpretation:   Old EKG Reviewed: unchanged    MDM  Labs and ekg are consistent with chf.  Will prescribe lasix, but recommend he discuss with his new pcp which other meds to start again.  Patient has a history of this and has been off all meds for several years.  He has an appointment with a new pcp next week and I have encouraged him to keep this.  To return if worsens.  Geoffery Lyons, MD 06/24/11 1557  Geoffery Lyons, MD 06/24/11 1558

## 2011-06-26 ENCOUNTER — Emergency Department (HOSPITAL_COMMUNITY)
Admission: EM | Admit: 2011-06-26 | Discharge: 2011-06-26 | Disposition: A | Discharge status: Home / Self Care | Payer: 59 | Attending: Emergency Medicine | Admitting: Emergency Medicine

## 2011-06-26 ENCOUNTER — Encounter (HOSPITAL_COMMUNITY): Payer: Self-pay | Admitting: *Deleted

## 2011-06-26 ENCOUNTER — Emergency Department (HOSPITAL_COMMUNITY): Payer: 59

## 2011-06-26 DIAGNOSIS — R110 Nausea: Secondary | ICD-10-CM | POA: Insufficient documentation

## 2011-06-26 DIAGNOSIS — E119 Type 2 diabetes mellitus without complications: Secondary | ICD-10-CM | POA: Insufficient documentation

## 2011-06-26 DIAGNOSIS — R079 Chest pain, unspecified: Secondary | ICD-10-CM | POA: Insufficient documentation

## 2011-06-26 DIAGNOSIS — R0602 Shortness of breath: Secondary | ICD-10-CM | POA: Insufficient documentation

## 2011-06-26 DIAGNOSIS — R509 Fever, unspecified: Secondary | ICD-10-CM | POA: Insufficient documentation

## 2011-06-26 DIAGNOSIS — I509 Heart failure, unspecified: Secondary | ICD-10-CM | POA: Insufficient documentation

## 2011-06-26 DIAGNOSIS — J189 Pneumonia, unspecified organism: Secondary | ICD-10-CM

## 2011-06-26 DIAGNOSIS — IMO0001 Reserved for inherently not codable concepts without codable children: Secondary | ICD-10-CM | POA: Insufficient documentation

## 2011-06-26 DIAGNOSIS — I10 Essential (primary) hypertension: Secondary | ICD-10-CM | POA: Insufficient documentation

## 2011-06-26 DIAGNOSIS — J9801 Acute bronchospasm: Secondary | ICD-10-CM

## 2011-06-26 DIAGNOSIS — R51 Headache: Secondary | ICD-10-CM | POA: Insufficient documentation

## 2011-06-26 DIAGNOSIS — R05 Cough: Secondary | ICD-10-CM | POA: Insufficient documentation

## 2011-06-26 DIAGNOSIS — R059 Cough, unspecified: Secondary | ICD-10-CM | POA: Insufficient documentation

## 2011-06-26 LAB — GLUCOSE, CAPILLARY

## 2011-06-26 MED ORDER — ACETAMINOPHEN 325 MG PO TABS
ORAL_TABLET | ORAL | Status: AC
  Filled 2011-06-26: qty 2

## 2011-06-26 MED ORDER — PREDNISONE 20 MG PO TABS: ORAL_TABLET | ORAL | Status: AC

## 2011-06-26 MED ORDER — ALBUTEROL SULFATE (5 MG/ML) 0.5% IN NEBU
5.0000 mg | INHALATION_SOLUTION | Freq: Once | RESPIRATORY_TRACT | Status: AC
  Administered 2011-06-26: 5 mg via RESPIRATORY_TRACT
  Filled 2011-06-26: qty 1
  Filled 2011-06-26: qty 0.5

## 2011-06-26 MED ORDER — AZITHROMYCIN 250 MG PO TABS: ORAL_TABLET | ORAL | Status: AC

## 2011-06-26 MED ORDER — CEPHALEXIN 500 MG PO CAPS: ORAL_CAPSULE | ORAL | Status: AC

## 2011-06-26 MED ORDER — PREDNISONE 20 MG PO TABS
60.0000 mg | ORAL_TABLET | Freq: Once | ORAL | Status: AC
  Administered 2011-06-26: 60 mg via ORAL
  Filled 2011-06-26: qty 3

## 2011-06-26 MED ORDER — ONDANSETRON 4 MG PO TBDP
8.0000 mg | ORAL_TABLET | Freq: Once | ORAL | Status: AC
  Administered 2011-06-26: 8 mg via ORAL
  Filled 2011-06-26: qty 1
  Filled 2011-06-26: qty 2

## 2011-06-26 MED ORDER — ALBUTEROL SULFATE HFA 108 (90 BASE) MCG/ACT IN AERS: 2.0000 | INHALATION_SPRAY | RESPIRATORY_TRACT | Status: DC | PRN

## 2011-06-26 MED ORDER — ACETAMINOPHEN 325 MG PO TABS
650.0000 mg | ORAL_TABLET | Freq: Once | ORAL | Status: AC
  Administered 2011-06-26: 650 mg via ORAL

## 2011-06-26 NOTE — ED Notes (Signed)
Reports feeling bad this am, was here recently for same. Reports body pain, headaches, nausea. Has temp 102.8 at triage.

## 2011-06-26 NOTE — ED Notes (Signed)
Also reports having swelling to legs, was seen recently for that and no relief with taking lasix, spo2 96% on room air, put mask on pt at triage and given tylenol for fever.

## 2011-06-26 NOTE — ED Provider Notes (Signed)
History     CSN: [id number]  Arrival date & time 06/26/11  1416   First MD Initiated Contact with Patient 06/26/11 1620      Chief Complaint  Patient presents with  . Fever  . Headache  . Nausea  . Generalized Body Aches    (Consider location/radiation/quality/duration/timing/severity/associated sxs/prior treatment) HPI This 41 year old male has history of diabetes hypertension and heart failure who is off all medicines for the last few years but restarted medicines for diabetes and restart the Lasix a couple of days ago he was last seen in the emergency department for a few weeks of edema to his legs. His leg swelling is not any worse now but he now returns with a flulike illness. For the last couple of days he has developed fevers chills mild headache cough possible wheezing shortness of breath chest discomfort only when he coughs nausea without abdominal pain vomiting or diarrhea, some generalized mild body aches but no rash no confusion no severe headache no stiff neck no severe shortness of breath and no worsening edema. His blood sugar has improved today by about 100 points compared to his blood sugar from a couple of days ago. Past Medical History  Diagnosis Date  . Hypertension   . Diabetes mellitus   . CHF (congestive heart failure)     Past Surgical History  Procedure Date  . Hernia repair   . Knee surgery     History reviewed. No pertinent family history.  History  Substance Use Topics  . Smoking status: Never Smoker   . Smokeless tobacco: Not on file  . Alcohol Use: Yes      Review of Systems  Constitutional: Positive for fever, chills and fatigue.       10 Systems reviewed and are negative for acute change except as noted in the HPI.  HENT: Negative for congestion.   Eyes: Negative for discharge and redness.  Respiratory: Positive for cough and shortness of breath.   Cardiovascular: Positive for chest pain and leg swelling.       Chest pain only when he  coughs  Gastrointestinal: Positive for nausea. Negative for vomiting, abdominal pain and diarrhea.  Musculoskeletal: Positive for myalgias and arthralgias. Negative for back pain.  Skin: Negative for rash.  Neurological: Negative for syncope, numbness and headaches.  Psychiatric/Behavioral:       No behavior change.    Allergies  Review of patient's allergies indicates no known allergies.  Home Medications   Current Outpatient Rx  Name Route Sig Dispense Refill  . FUROSEMIDE 40 MG PO TABS Oral Take 1 tablet (40 mg total) by mouth daily. 10 tablet 0  . GLIPIZIDE 10 MG PO TABS Oral Take 1 tablet (10 mg total) by mouth 2 (two) times daily before a meal. 20 tablet 0  . METFORMIN HCL 500 MG PO TABS Oral Take 1 tablet (500 mg total) by mouth 2 (two) times daily with a meal. 20 tablet 0  . ALBUTEROL SULFATE HFA 108 (90 BASE) MCG/ACT IN AERS Inhalation Inhale 2 puffs into the lungs every 2 (two) hours as needed for wheezing or shortness of breath (cough). 1 Inhaler 0  . AZITHROMYCIN 250 MG PO TABS  2 po day one, then 1 daily x 4 days 5 tablet 0  . CEPHALEXIN 500 MG PO CAPS  2 caps po bid x 7 days 28 capsule 0  . PREDNISONE 20 MG PO TABS  2 tabs po daily x 4 days 8 tablet 0  BP 166/116  Pulse 110  Temp(Src) 98.2 F (36.8 C) (Oral)  Resp 19  SpO2 99%  Physical Exam  Nursing note and vitals reviewed. Constitutional:       Awake, alert, nontoxic appearance.  HENT:  Head: Atraumatic.  Mouth/Throat: Oropharynx is clear and moist. No oropharyngeal exudate.  Eyes: Conjunctivae are normal. Pupils are equal, round, and reactive to light. Right eye exhibits no discharge. Left eye exhibits no discharge.  Neck: Neck supple.  Cardiovascular: Normal rate and regular rhythm.   No murmur heard. Pulmonary/Chest: Effort normal. No respiratory distress. He has wheezes. He has no rales. He exhibits no tenderness.       Decreased breath sounds bilaterally with prolonged expiratory phase with mild  scattered expiratory wheezes without rales or accessory muscle usage or retractions  Abdominal: Soft. There is no tenderness. There is no rebound.  Musculoskeletal: He exhibits edema. He exhibits no tenderness.       Baseline ROM, no obvious new focal weakness. Stable edema to both lower legs without erythema suggestive of cellulitis  Neurological:       Mental status and motor strength appears baseline for patient and situation.  Skin: No rash noted.  Psychiatric: He has a normal mood and affect.    ED Course  Procedures (including critical care time)  Labs Reviewed  GLUCOSE, CAPILLARY - Abnormal; Notable for the following:    Glucose-Capillary 266 (*)    All other components within normal limits   Dg Chest 2 View  06/26/2011  *RADIOLOGY REPORT*  Clinical Data: Cough and shortness of breath.  CHEST - 2 VIEW  Comparison: Chest x-ray 10/31/2006.  Findings: The cardiac silhouette, mediastinal and hilar contours are within normal limits and stable.  There is a right lower lobe infiltrate and associated small pleural effusion.  The left lung is clear.  The bony thorax is intact.  IMPRESSION: Right lower lobe pneumonia and small right parapneumonic effusion.  Original Report Authenticated By: P. Loralie Champagne, M.D.     1. CAP (community acquired pneumonia)   2. Acute bronchospasm       MDM  I doubt any other EMC precluding discharge at this time including, but not necessarily limited to the following:ADHF, sepsis.       Hurman Horn, MD 06/26/11 2145

## 2011-06-28 ENCOUNTER — Encounter: Payer: Self-pay | Admitting: Family Medicine

## 2011-06-28 ENCOUNTER — Ambulatory Visit (INDEPENDENT_AMBULATORY_CARE_PROVIDER_SITE_OTHER): Payer: 59 | Admitting: Family Medicine

## 2011-06-28 ENCOUNTER — Telehealth: Payer: Self-pay | Admitting: Family Medicine

## 2011-06-28 DIAGNOSIS — IMO0001 Reserved for inherently not codable concepts without codable children: Secondary | ICD-10-CM

## 2011-06-28 DIAGNOSIS — Z8701 Personal history of pneumonia (recurrent): Secondary | ICD-10-CM

## 2011-06-28 DIAGNOSIS — I5020 Unspecified systolic (congestive) heart failure: Secondary | ICD-10-CM

## 2011-06-28 DIAGNOSIS — I509 Heart failure, unspecified: Secondary | ICD-10-CM

## 2011-06-28 DIAGNOSIS — E1165 Type 2 diabetes mellitus with hyperglycemia: Secondary | ICD-10-CM | POA: Insufficient documentation

## 2011-06-28 DIAGNOSIS — Z23 Encounter for immunization: Secondary | ICD-10-CM

## 2011-06-28 DIAGNOSIS — I10 Essential (primary) hypertension: Secondary | ICD-10-CM

## 2011-06-28 DIAGNOSIS — G4733 Obstructive sleep apnea (adult) (pediatric): Secondary | ICD-10-CM | POA: Insufficient documentation

## 2011-06-28 LAB — CBC WITH DIFFERENTIAL/PLATELET
Basophils Relative: 0.3 % (ref 0.0–3.0)
Eosinophils Absolute: 0 10*3/uL (ref 0.0–0.7)
Eosinophils Relative: 0.5 % (ref 0.0–5.0)
HCT: 38.4 % — ABNORMAL LOW (ref 39.0–52.0)
Hemoglobin: 12.8 g/dL — ABNORMAL LOW (ref 13.0–17.0)
Lymphs Abs: 1 10*3/uL (ref 0.7–4.0)
MCHC: 33.4 g/dL (ref 30.0–36.0)
MCV: 92.5 fl (ref 78.0–100.0)
Monocytes Absolute: 0.7 10*3/uL (ref 0.1–1.0)
Neutro Abs: 5 10*3/uL (ref 1.4–7.7)
Neutrophils Relative %: 74.4 % (ref 43.0–77.0)
RBC: 4.15 Mil/uL — ABNORMAL LOW (ref 4.22–5.81)
WBC: 6.7 10*3/uL (ref 4.5–10.5)

## 2011-06-28 LAB — BRAIN NATRIURETIC PEPTIDE: Pro B Natriuretic peptide (BNP): 379 pg/mL — ABNORMAL HIGH (ref 0.0–100.0)

## 2011-06-28 LAB — COMPREHENSIVE METABOLIC PANEL
AST: 29 U/L (ref 0–37)
Albumin: 2.6 g/dL — ABNORMAL LOW (ref 3.5–5.2)
Alkaline Phosphatase: 42 U/L (ref 39–117)
BUN: 17 mg/dL (ref 6–23)
Creatinine, Ser: 1.1 mg/dL (ref 0.4–1.5)
Glucose, Bld: 205 mg/dL — ABNORMAL HIGH (ref 70–99)
Potassium: 3.3 mEq/L — ABNORMAL LOW (ref 3.5–5.1)
Total Bilirubin: 2 mg/dL — ABNORMAL HIGH (ref 0.3–1.2)

## 2011-06-28 LAB — LIPID PANEL
Cholesterol: 119 mg/dL (ref 0–200)
VLDL: 20.4 mg/dL (ref 0.0–40.0)

## 2011-06-28 LAB — HEMOGLOBIN A1C: Hgb A1c MFr Bld: 10.5 % — ABNORMAL HIGH (ref 4.6–6.5)

## 2011-06-28 LAB — TSH: TSH: 1.32 u[IU]/mL (ref 0.35–5.50)

## 2011-06-28 LAB — MICROALBUMIN / CREATININE URINE RATIO
Creatinine,U: 97.5 mg/dL
Microalb Creat Ratio: 6 mg/g (ref 0.0–30.0)

## 2011-06-28 MED ORDER — GLUCOSE BLOOD VI STRP: ORAL_STRIP | Status: DC

## 2011-06-28 MED ORDER — GLIPIZIDE 10 MG PO TABS: 10.0000 mg | ORAL_TABLET | Freq: Two times a day (BID) | ORAL | Status: DC

## 2011-06-28 MED ORDER — FUROSEMIDE 40 MG PO TABS: 40.0000 mg | ORAL_TABLET | Freq: Every day | ORAL | Status: DC

## 2011-06-28 MED ORDER — POTASSIUM CHLORIDE 20 MEQ PO PACK: 20.0000 meq | PACK | Freq: Every day | ORAL | Status: DC

## 2011-06-28 MED ORDER — LISINOPRIL 5 MG PO TABS: 5.0000 mg | ORAL_TABLET | Freq: Every day | ORAL | Status: DC

## 2011-06-28 MED ORDER — FREESTYLE SYSTEM KIT: 1.0000 | PACK | Status: DC | PRN

## 2011-06-28 MED ORDER — FREESTYLE LANCETS MISC: Status: DC

## 2011-06-28 MED ORDER — FUROSEMIDE 40 MG PO TABS: 40.0000 mg | ORAL_TABLET | Freq: Two times a day (BID) | ORAL | Status: DC

## 2011-06-28 MED ORDER — METFORMIN HCL 500 MG PO TABS: 500.0000 mg | ORAL_TABLET | Freq: Two times a day (BID) | ORAL | Status: DC

## 2011-06-28 NOTE — Patient Instructions (Signed)
I've sent in glucommeter. Start checking sugars once daily. Start lisinopril 5mg  daily (low dose). We will check blood work today. Return in next few weeks for physical (mondays)

## 2011-06-28 NOTE — Assessment & Plan Note (Signed)
Anticipate uncontrolled. Sent in glucometer, lancets and strips. Recently started metformin, glipizide.  No changes today as changing BP/CHF meds. Blood work today. rec start checking daily and keep log, bring to next OV. Will discuss Hep B and pneumonia shots at f/u when feeling better.

## 2011-06-28 NOTE — Telephone Encounter (Signed)
Last echo was 2006.  I would like to order one to f/u CHF - placed order in chart. Please notify cholesterol levels great, with bad chol stable at 51, goal for him <100 but lower = better. Blood counts stable. Kidneys and liver and thyroid normal.  Protein low - ensure eating well. A1c and sugar elevated - A1c 10.5, highest it's been.  goal for him <7%.  Continue meds, will discuss increase when returns. I would like him to increase lasix to 40mg  twice daily - am and at noon to help with swelling and SOB.   Potassium a bit low - given increasing lasix, would like him to take extra supplement daily - daily. Update me if questions or concerns.

## 2011-06-28 NOTE — Assessment & Plan Note (Signed)
Requested records from prior PCP, advised pt call company for new mask.

## 2011-06-28 NOTE — Assessment & Plan Note (Addendum)
Recent RLL PNA, along with significant wheezing.  Nonsmoker, no h/o COPD or asthma per pt. Already on prednisone course, albuterol prn sob, wheeze. Seems to slowly be resolving. Continue to monitor for now, advised finish abx and steroids, update me if sxs worsening. Staying mildly tachycardic.

## 2011-06-28 NOTE — Assessment & Plan Note (Addendum)
Not controlled. Recently started on lasix, will add acei today and check blood work. rtc next few weeks for CPE and f/u.

## 2011-06-28 NOTE — Assessment & Plan Note (Signed)
Started on lasix last week.  Tolerating well. Start low dose ACEI given elevated bp in setting of CHF. Check blood work today, if tolerated may increase lasix and/or bp meds. Edema present today.

## 2011-06-28 NOTE — Progress Notes (Signed)
Subjective:    Patient ID: Johnathan Archer, male    DOB: 05/13/1970, 40 y.o.   MRN: [id number]  HPI CC: new pt establish  Prior saw Dr. Shaune Pollack, [place], had insurance problems so did not f/u.  Would like to establish today, would like to get meds refilled today.  Recently seen at ER first for CHF then for PNA, currently on zpack and keflex and prednisone.  Also placed on albuterol and started on lasix, glipizide, metformin.  Only received 10 d course.  Records reviewed.  CXR with: Right lower lobe pneumonia and small right parapneumonic effusion.  DM - dx 2006, prior on insulin, now just on metformin and glipizide.  No glucometer at home.  Hasn't recently checked sugar.  Last vision screen about 2 yrs ago.  No recent foot exam.  CHF - dx 2006.  Saw Dr. Donnie Aho in past (~2006), never f/u.  Tolerating lasix ok, started recently.  endorses SOB with exhertion (ie 1 flight of stairs).  See ROS. Denies chest pain, dizziness.  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  Last echo was 2006, EF 30-35%, mildly dilated LV and mild-mod MR.  HTN - only on lasix.  See ROS.  OSA - has CPAP at night.  Needs new mask - advised to call [employer] for new mask.  Preventative:  No recent blood work, physical.  Fasting today. Tetanus - >10 yrs ago.  Would like today Flu - had one 2011.  Declines today. Pneumonia shot - never had before Hep B - never had before.  Caffeine: none Lives with wife and daughter (8yo), no pets Occupation: Naval architect Edu: 60yr college Activity: works, no regular activity Diet: water daily, fruits/vegetables daily  Medications and allergies reviewed and updated in chart.  Past histories reviewed and updated if relevant as below. There is no problem list on file for this patient.  Past Medical History  Diagnosis Date  . Hypertension   . Type II or  unspecified type diabetes mellitus without mention of complication, uncontrolled   . CHF (congestive heart failure)   . History of chicken pox    Past Surgical History  Procedure Date  . Hernia repair 2002  . Knee surgery     L knee in HS   History  Substance Use Topics  . Smoking status: Never Smoker   . Smokeless tobacco: Never Used  . Alcohol Use: Yes     Occasional 1x/mo   Family History  Problem Relation Age of Onset  . Diabetes Mother   . Hypertension Father   . Stroke Neg Hx   . Coronary artery disease Neg Hx   . Cancer Neg Hx    No Known Allergies Current Outpatient Prescriptions on File Prior to Visit  Medication Sig Dispense Refill  . albuterol (PROVENTIL HFA;VENTOLIN HFA) 108 (90 BASE) MCG/ACT inhaler Inhale 2 puffs into the lungs every 2 (two) hours as needed for wheezing or shortness of breath (cough).  1 Inhaler  0  . azithromycin (ZITHROMAX Z-PAK) 250 MG tablet 2 po day one, then 1 daily x 4 days  5 tablet  0  . cephALEXin (KEFLEX) 500 MG capsule 2 caps po bid x 7 days  28 capsule  0  . furosemide (LASIX) 40 MG tablet Take 1 tablet (40 mg total) by mouth daily.  10 tablet  0  . glipiZIDE (GLUCOTROL) 10 MG tablet Take 1 tablet (  10 mg total) by mouth 2 (two) times daily before a meal.  20 tablet  0  . metFORMIN (GLUCOPHAGE) 500 MG tablet Take 1 tablet (500 mg total) by mouth 2 (two) times daily with a meal.  20 tablet  0  . predniSONE (DELTASONE) 20 MG tablet 2 tabs po daily x 4 days  8 tablet  0   Review of Systems  Constitutional: Positive for fever. Negative for chills, activity change, appetite change, fatigue and unexpected weight change.  HENT: Negative for hearing loss and neck pain.   Eyes: Negative for visual disturbance.  Respiratory: Positive for cough, shortness of breath and wheezing. Negative for chest tightness.   Cardiovascular: Positive for leg swelling. Negative for chest pain and palpitations.  Gastrointestinal: Negative for nausea, vomiting,  abdominal pain, diarrhea, constipation, blood in stool and abdominal distention.  Genitourinary: Negative for hematuria and difficulty urinating.  Musculoskeletal: Negative for myalgias and arthralgias.  Skin: Negative for rash.  Neurological: Negative for dizziness, seizures, syncope and headaches.  Hematological: Does not bruise/bleed easily.  Psychiatric/Behavioral: Negative for dysphoric mood. The patient is not nervous/anxious.        Objective:   Physical Exam  Nursing note and vitals reviewed. Constitutional: He is oriented to person, place, and time. He appears well-developed and well-nourished. No distress.       Tired, coughing  HENT:  Head: Normocephalic and atraumatic.  Right Ear: Hearing, tympanic membrane, external ear and ear canal normal.  Left Ear: Hearing, tympanic membrane, external ear and ear canal normal.  Nose: Nose normal. No mucosal edema or rhinorrhea.  Mouth/Throat: Uvula is midline, oropharynx is clear and moist and mucous membranes are normal. No oropharyngeal exudate, posterior oropharyngeal edema, posterior oropharyngeal erythema or tonsillar abscesses.  Eyes: Conjunctivae and EOM are normal. Pupils are equal, round, and reactive to light. No scleral icterus.  Neck: Normal range of motion. Neck supple. Carotid bruit is not present. No thyromegaly present.  Cardiovascular: Normal rate, regular rhythm, normal heart sounds and intact distal pulses.   No murmur heard. Pulses:      Radial pulses are 2+ on the right side, and 2+ on the left side.  Pulmonary/Chest: Effort normal. No respiratory distress. He has wheezes. He has no rales.       +wheezing throughout, normal WOB, no accessory muscle use, coarse + rales RLL  Abdominal: Soft. Bowel sounds are normal. He exhibits distension. He exhibits no mass. There is no tenderness. There is no rebound and no guarding.  Musculoskeletal: Normal range of motion. He exhibits edema (2+ pitting pedal edema bilaterally).    Lymphadenopathy:    He has no cervical adenopathy.  Neurological: He is alert and oriented to person, place, and time.       CN grossly intact, station and gait intact  Skin: Skin is warm and dry. No rash noted.  Psychiatric: He has a normal mood and affect. His behavior is normal. Judgment and thought content normal.      Assessment & Plan:

## 2011-06-29 NOTE — Telephone Encounter (Signed)
Patient notified. He will make changes in meds. Encouraged healthier eating habits due to decreased protein and elevated A1C. He verbalized understanding. He will call with questions or concerns and discuss things further at follow up.

## 2011-06-29 NOTE — Telephone Encounter (Signed)
Message left for patient to return my call.  

## 2011-07-19 ENCOUNTER — Other Ambulatory Visit: Payer: 59 | Admitting: *Deleted

## 2011-07-19 ENCOUNTER — Other Ambulatory Visit: Payer: Self-pay | Admitting: Family Medicine

## 2011-07-19 DIAGNOSIS — I10 Essential (primary) hypertension: Secondary | ICD-10-CM

## 2011-07-22 HISTORY — PX: US ECHOCARDIOGRAPHY: HXRAD669

## 2011-07-24 ENCOUNTER — Encounter: Payer: Self-pay | Admitting: Family Medicine

## 2011-07-25 ENCOUNTER — Other Ambulatory Visit (INDEPENDENT_AMBULATORY_CARE_PROVIDER_SITE_OTHER): Payer: 59

## 2011-07-25 DIAGNOSIS — I10 Essential (primary) hypertension: Secondary | ICD-10-CM

## 2011-07-25 LAB — COMPREHENSIVE METABOLIC PANEL
ALT: 37 U/L (ref 0–53)
AST: 26 U/L (ref 0–37)
Alkaline Phosphatase: 44 U/L (ref 39–117)
BUN: 22 mg/dL (ref 6–23)
Chloride: 104 mEq/L (ref 96–112)
Creatinine, Ser: 1.3 mg/dL (ref 0.4–1.5)
Total Bilirubin: 0.9 mg/dL (ref 0.3–1.2)

## 2011-07-27 ENCOUNTER — Other Ambulatory Visit: Payer: Self-pay | Admitting: Family Medicine

## 2011-07-27 MED ORDER — GLIPIZIDE 10 MG PO TABS: 10.0000 mg | ORAL_TABLET | Freq: Two times a day (BID) | ORAL | Status: DC

## 2011-07-27 MED ORDER — METFORMIN HCL 500 MG PO TABS: 500.0000 mg | ORAL_TABLET | Freq: Two times a day (BID) | ORAL | Status: DC

## 2011-07-27 NOTE — Telephone Encounter (Signed)
Refill meds

## 2011-08-01 ENCOUNTER — Ambulatory Visit (INDEPENDENT_AMBULATORY_CARE_PROVIDER_SITE_OTHER): Payer: 59 | Admitting: Family Medicine

## 2011-08-01 ENCOUNTER — Encounter: Payer: Self-pay | Admitting: Family Medicine

## 2011-08-01 VITALS — BP 176/120 | HR 108 | Temp 98.5°F | Ht 71.0 in | Wt 240.8 lb

## 2011-08-01 DIAGNOSIS — I10 Essential (primary) hypertension: Secondary | ICD-10-CM

## 2011-08-01 DIAGNOSIS — N529 Male erectile dysfunction, unspecified: Secondary | ICD-10-CM

## 2011-08-01 DIAGNOSIS — I5020 Unspecified systolic (congestive) heart failure: Secondary | ICD-10-CM

## 2011-08-01 MED ORDER — FUROSEMIDE 40 MG PO TABS: 40.0000 mg | ORAL_TABLET | Freq: Two times a day (BID) | ORAL | Status: DC

## 2011-08-01 MED ORDER — METFORMIN HCL 1000 MG PO TABS: 1000.0000 mg | ORAL_TABLET | Freq: Two times a day (BID) | ORAL | Status: DC

## 2011-08-01 MED ORDER — POTASSIUM CHLORIDE 20 MEQ PO PACK: 20.0000 meq | PACK | Freq: Every day | ORAL | Status: DC

## 2011-08-01 MED ORDER — LISINOPRIL 5 MG PO TABS: 10.0000 mg | ORAL_TABLET | Freq: Every day | ORAL | Status: DC

## 2011-08-01 MED ORDER — CARVEDILOL 6.25 MG PO TABS: 6.2500 mg | ORAL_TABLET | Freq: Two times a day (BID) | ORAL | Status: DC

## 2011-08-01 MED ORDER — TADALAFIL 20 MG PO TABS: 20.0000 mg | ORAL_TABLET | Freq: Every day | ORAL | Status: DC | PRN

## 2011-08-01 NOTE — Assessment & Plan Note (Signed)
Chronic. Uncontrolled.  Compliant with meds. Increase metformin to 1000mg  bid, continue sulfonylurea. If not better by next visit, start insulin (70/30). rtc 1 mo

## 2011-08-01 NOTE — Assessment & Plan Note (Signed)
Chronic, uncontrolled. Increase lisinopril to 10 mg daily, start coreg 6.25mg  bid.

## 2011-08-01 NOTE — Assessment & Plan Note (Signed)
Anticipate multifactorial. Discussed improtance of control of other medical conditions. Will do trial cialis.  Nonobstructive cath 2004. Noted hypoalbuminemia contributing to periph edema.  Denies significant EtOH use.  No abd pain, hepatomegaly, LFTs normal.  Consider abd [place] down road.

## 2011-08-01 NOTE — Progress Notes (Signed)
Subjective:    Patient ID: Johnathan Archer, male    DOB: 05/13/1970, 41 y.o.   MRN: [id number]  HPI CC: CPE, f/u medical issues.  41 yo with h/o sCHF, uncontrolled T2DM, HTN, OSA, idiopathic dilated CM thought viral with nonobstructive cath 2004.  Last echo 2006 with EF 30-35%.  Missed scheduled echo last month.  Prior saw Dr. Donnie Aho.  Presents for CPE today.  Actually convered to f/u visit of medical issues.  Concerns - still somewhat fatigued.  recently getting over PNA 06/2011.  Legs staying swollen.  ED - since sick last month, unable to maintain erection.  Desire, libido still there.  Would like trial of cialis.  DM - no recent vision check, due for this.  Compliant with meds.  No paresthesias.  Did not pick up glucommeter 2/2 cost.  Sugars uncontrolled.  Pt desires to increase oral meds prior to starting insulin, concerned 2/2 cost. Lab Results  Component Value Date   HGBA1C 10.5* 06/28/2011   HTN - compliant with low dose ACEI.  bp staying elevated.  Doesn't check at home.  Will keep eye on this at [employer].  Did not start potassium yet. BP Readings from Last 3 Encounters:  08/01/11 176/120  06/28/11 152/120  06/26/11 166/116   Not hyperlipidemic. Lab Results  Component Value Date   CHOL 119 06/28/2011   HDL 47.40 06/28/2011   LDLCALC 51 06/28/2011   TRIG 102.0 06/28/2011   CHOLHDL 3 06/28/2011   Wt Readings from Last 3 Encounters:  08/01/11 240 lb 12 oz (109.203 kg)  06/28/11 245 lb 12 oz (111.471 kg)   No HA, vision changes, CP/tightness, SOB, + leg swelling but improved.  No paresthesias  Preventative: Reviewed blood work in detail. No fmhx prostate or colon cancer. Tetanus - 06/2011 Tdap Pneumonia shot - never had before  Hep B - never had before.  Medications and allergies reviewed and updated in chart.  Past histories reviewed and updated if relevant as below. Patient Active Problem List  Diagnoses  . Hypertension  . Type II or unspecified type diabetes  mellitus without mention of complication, uncontrolled  . OSA (obstructive sleep apnea)  . History of pneumonia  . Systolic CHF   Past Medical History  Diagnosis Date  . Hypertension   . Type II or unspecified type diabetes mellitus without mention of complication, uncontrolled   . Systolic CHF 2004    idiopathic dilated CM, thought viral.  cath 2004 negative for obstruction, last echo 2006 EF 30-35%  . History of chicken pox   . OSA (obstructive sleep apnea)   . History of pneumonia     2006 LLL PNA, 06/2011 RLL PNA  . Mitral regurgitation 2006    mild-mod at last echo   Past Surgical History  Procedure Date  . Hernia repair 2002  . Knee surgery     L knee in HS  . Cardiac catheterization 2004    no blockages   History  Substance Use Topics  . Smoking status: Never Smoker   . Smokeless tobacco: Never Used  . Alcohol Use: Yes     Occasional 1x/mo   Family History  Problem Relation Age of Onset  . Diabetes Mother   . Hypertension Father   . Stroke Neg Hx   . Coronary artery disease Neg Hx   . Cancer Neg Hx    No Known Allergies Current Outpatient Prescriptions on File Prior to Visit  Medication Sig Dispense Refill  . glipiZIDE (GLUCOTROL)  10 MG tablet Take 1 tablet (10 mg total) by mouth 2 (two) times daily before a meal.  60 tablet  3  . glucose blood test strip Use as instructed  100 each  12  . glucose monitoring kit (FREESTYLE) monitoring kit 1 each by Does not apply route as needed for other.  1 each  0  . Lancets (FREESTYLE) lancets Use as instructed  100 each  12   Review of Systems  Constitutional: Negative for fever, chills, activity change, appetite change, fatigue and unexpected weight change.  HENT: Negative for hearing loss and neck pain.   Eyes: Negative for visual disturbance.  Respiratory: Negative for cough, chest tightness, shortness of breath and wheezing.   Cardiovascular: Positive for leg swelling. Negative for chest pain and palpitations.    Gastrointestinal: Negative for nausea, vomiting, abdominal pain, diarrhea, constipation, blood in stool and abdominal distention.  Genitourinary: Negative for hematuria and difficulty urinating.  Musculoskeletal: Negative for myalgias and arthralgias.  Skin: Negative for rash.  Neurological: Negative for dizziness, seizures, syncope and headaches.  Hematological: Does not bruise/bleed easily.  Psychiatric/Behavioral: Negative for dysphoric mood. The patient is not nervous/anxious.        Objective:   Physical Exam  Nursing note and vitals reviewed. Constitutional: He is oriented to person, place, and time. He appears well-developed and well-nourished. No distress.  HENT:  Head: Normocephalic and atraumatic.  Right Ear: External ear normal.  Left Ear: External ear normal.  Nose: Nose normal.  Mouth/Throat: Oropharynx is clear and moist. No oropharyngeal exudate.  Eyes: Conjunctivae and EOM are normal. Pupils are equal, round, and reactive to light. No scleral icterus.  Neck: Normal range of motion. Neck supple. No JVD present. No thyromegaly present.  Cardiovascular: Normal rate, regular rhythm, normal heart sounds and intact distal pulses.   No murmur heard. Pulses:      Radial pulses are 2+ on the right side, and 2+ on the left side.  Pulmonary/Chest: Effort normal and breath sounds normal. No respiratory distress. He has no wheezes. He has no rales.  Abdominal: Soft. Bowel sounds are normal. He exhibits distension. He exhibits no mass. There is no tenderness. There is no rebound and no guarding.  Musculoskeletal: Normal range of motion. He exhibits edema (1+ bilateral pitting pedal edema).       Diabetic foot exam: Normal inspection No skin breakdown No calluses  Normal DP/PT pulses Normal sensation to light tough and monofilament Nails normal   Lymphadenopathy:    He has no cervical adenopathy.  Neurological: He is alert and oriented to person, place, and time.       CN  grossly intact, station and gait intact  Skin: Skin is warm and dry. No rash noted.  Psychiatric: He has a normal mood and affect. His behavior is normal. Judgment and thought content normal.      Assessment & Plan:

## 2011-08-01 NOTE — Patient Instructions (Addendum)
We've increased lisinopril to 10mg  daily.  I've refilled lisinopril and lasix (for blood pressure and heart) and potassium pills. i'd like you to start blood pressure medicine called coreg - start at 6.25 mg twice daily. Start baby aspirin daily. Increase metformin to 1 pill in am and 2 at night for 1 week then increase to 2pills in am and 2 pills at night.  New prescription will be for 1000mg  of metformin so you'll just have to take 1 pill twice daily. Buy glucometer and keep log of sugars once daily 1 week prior to next visit.  If staying high, will start insulin (novolog 70/30 twice daily). Schedule eye doctor visit. Pass by [facility] for referral to cardiology (Dr. Donnie Aho) as well as reschedule ultrasound. Return in 1 month for follow up. Trial of cialis - coupon provided for 3 pills.  Take 1/2 pill at a time.

## 2011-08-01 NOTE — Assessment & Plan Note (Signed)
Refer to cards (Dr. Donnie Aho) for assistance with this. On ACEI, increasing today.  Start B blocker.  Start baby aspirin. Discussed chronic nature of condition, and need for meds lifelong. rec reschedule echo.

## 2011-08-15 ENCOUNTER — Ambulatory Visit (HOSPITAL_COMMUNITY): Payer: 59

## 2011-08-16 ENCOUNTER — Ambulatory Visit (HOSPITAL_COMMUNITY): Payer: 59 | Attending: Cardiovascular Disease

## 2011-08-16 ENCOUNTER — Other Ambulatory Visit (HOSPITAL_COMMUNITY): Payer: Self-pay | Admitting: Family Medicine

## 2011-08-16 ENCOUNTER — Encounter: Payer: Self-pay | Admitting: Family Medicine

## 2011-08-16 ENCOUNTER — Other Ambulatory Visit: Payer: Self-pay

## 2011-08-16 ENCOUNTER — Telehealth: Payer: Self-pay | Admitting: Family Medicine

## 2011-08-16 DIAGNOSIS — I509 Heart failure, unspecified: Secondary | ICD-10-CM

## 2011-08-16 DIAGNOSIS — I079 Rheumatic tricuspid valve disease, unspecified: Secondary | ICD-10-CM | POA: Insufficient documentation

## 2011-08-16 DIAGNOSIS — I10 Essential (primary) hypertension: Secondary | ICD-10-CM | POA: Insufficient documentation

## 2011-08-16 DIAGNOSIS — I059 Rheumatic mitral valve disease, unspecified: Secondary | ICD-10-CM | POA: Insufficient documentation

## 2011-08-16 DIAGNOSIS — E119 Type 2 diabetes mellitus without complications: Secondary | ICD-10-CM | POA: Insufficient documentation

## 2011-08-16 DIAGNOSIS — G4730 Sleep apnea, unspecified: Secondary | ICD-10-CM | POA: Insufficient documentation

## 2011-08-16 DIAGNOSIS — I379 Nonrheumatic pulmonary valve disorder, unspecified: Secondary | ICD-10-CM | POA: Insufficient documentation

## 2011-08-16 NOTE — Telephone Encounter (Signed)
[facility] called, says patients  blood pressure is running 176/120pm. He had a echocardio today. Sherri said pt is taking his medication in the afternoon, had not taken it while in the office today.

## 2011-08-16 NOTE — Telephone Encounter (Signed)
Noted. Thanks.  This is what he has been running at last few visits.  Slowly titrating up meds.  Hesitant to increase ACEI without first checking bp.  Pt has f/u scheduled for next week with cards and afterwards with me.

## 2011-08-22 ENCOUNTER — Ambulatory Visit: Payer: 59 | Admitting: Cardiovascular Disease

## 2011-08-29 ENCOUNTER — Ambulatory Visit (INDEPENDENT_AMBULATORY_CARE_PROVIDER_SITE_OTHER): Payer: 59 | Admitting: Family Medicine

## 2011-08-29 ENCOUNTER — Encounter: Payer: Self-pay | Admitting: Family Medicine

## 2011-08-29 VITALS — BP 164/110 | HR 88 | Temp 98.8°F | Wt 242.8 lb

## 2011-08-29 DIAGNOSIS — Z23 Encounter for immunization: Secondary | ICD-10-CM

## 2011-08-29 DIAGNOSIS — I5042 Chronic combined systolic (congestive) and diastolic (congestive) heart failure: Secondary | ICD-10-CM

## 2011-08-29 DIAGNOSIS — I10 Essential (primary) hypertension: Secondary | ICD-10-CM

## 2011-08-29 LAB — BASIC METABOLIC PANEL
CO2: 30 mEq/L (ref 19–32)
Calcium: 8.6 mg/dL (ref 8.4–10.5)
Creatinine, Ser: 1.2 mg/dL (ref 0.4–1.5)
GFR: 84.07 mL/min (ref >60.00)
Sodium: 138 mEq/L (ref 135–145)

## 2011-08-29 MED ORDER — LISINOPRIL 20 MG PO TABS: 20.0000 mg | ORAL_TABLET | Freq: Every day | ORAL | Status: DC

## 2011-08-29 NOTE — Patient Instructions (Addendum)
Increase lisinopril to 4 tablets of 5mg  daily, new prescription will be for 20mg  daily. Continue other medicines as up to now. Blood work today.  Pneumonia shot today. glucometer provided today - start checking sugars fasting (goal <120) and bring log of numbers 1 wk prior to next appointment. Return a few days prior to next appointment (in 3 months) for blood work. keep appointment with cardiology.

## 2011-08-29 NOTE — Progress Notes (Signed)
Subjective:    Patient ID: Johnathan Archer, male    DOB: 17-Aug-1970, 41 y.o.   MRN: [id number]  HPI CC: 1 mo f/u  41 yo with h/o combined CHF on last echo, uncontrolled T2DM, HTN, OSA, idiopathic dilated CM thought viral with nonobstructive cath 2004. Presents as 1 mo f/u.  Planning on taking upcoming trip to beach.  No CP/SOB, coughing.  Staying fatigued.  Leg swelling remaining.  DM - didn't buy glucometer.  Financial concerns.  Does not keep eye on sugars.  fasting today.  No lows/hypoglycemia.  No recent vision screen.   Lab Results  Component Value Date   CREATININE 1.3 07/25/2011   HTN - bp staying elevated at home.  Did not take meds this morning.  Checks BP about once every 2 wks, when goes to [employer].  No HA, vision changes, CP/tightness, SOB.  Has appt with cards for next month 09/27/2011.  Discussed d/sCHF and reasons to treat/control medical issues.  Avoiding salt.  Wt Readings from Last 3 Encounters:  08/29/11 242 lb 12 oz (110.111 kg)  08/01/11 240 lb 12 oz (109.203 kg)  06/28/11 245 lb 12 oz (111.471 kg)   Medications and allergies reviewed and updated in chart.  Past histories reviewed and updated if relevant as below. Patient Active Problem List  Diagnoses  . Hypertension  . Type II or unspecified type diabetes mellitus without mention of complication, uncontrolled  . OSA (obstructive sleep apnea)  . History of pneumonia  . Systolic CHF  . ED (erectile dysfunction)   Past Medical History  Diagnosis Date  . Hypertension   . Type II or unspecified type diabetes mellitus without mention of complication, uncontrolled   . Systolic and diastolic CHF, chronic 2004    idiopathic dilated CM, thought viral.  cath 2004 negative for obstruction, last echo 07/2011 EF 35%  . History of chicken pox   . OSA (obstructive sleep apnea)   . History of pneumonia     2006 LLL PNA, 06/2011 RLL PNA  . Mitral regurgitation 2006    mild-mod at last echo  . ED (erectile  dysfunction)    Past Surgical History  Procedure Date  . Hernia repair 2002  . Knee surgery     L knee in HS  . Cardiac catheterization 2004    no blockages  . US echocardiography 07/2011    mod LVH, EF 35%, diffuse hypokinesis of entire myocardium, irreversible restrictive pattern (grade 4 diastolic dysfunction), mod MR, mod dilated LA/RA, decreased RV systolic fxn, no PFO   History  Substance Use Topics  . Smoking status: Never Smoker   . Smokeless tobacco: Never Used  . Alcohol Use: Yes     Occasional 1x/mo   Family History  Problem Relation Age of Onset  . Diabetes Mother   . Hypertension Father   . Stroke Neg Hx   . Coronary artery disease Neg Hx   . Cancer Neg Hx    No Known Allergies Current Outpatient Prescriptions on File Prior to Visit  Medication Sig Dispense Refill  . carvedilol (COREG) 6.25 MG tablet Take 1 tablet (6.25 mg total) by mouth 2 (two) times daily with a meal.  180 tablet  3  . furosemide (LASIX) 40 MG tablet Take 1 tablet (40 mg total) by mouth 2 (two) times daily.  180 tablet  3  . glipiZIDE (GLUCOTROL) 10 MG tablet Take 1 tablet (10 mg total) by mouth 2 (two) times daily before a meal.  60  tablet  3  . glucose blood test strip Use as instructed  100 each  12  . glucose monitoring kit (FREESTYLE) monitoring kit 1 each by Does not apply route as needed for other.  1 each  0  . Lancets (FREESTYLE) lancets Use as instructed  100 each  12  . metFORMIN (GLUCOPHAGE) 1000 MG tablet Take 1 tablet (1,000 mg total) by mouth 2 (two) times daily with a meal.  180 tablet  3  . tadalafil (CIALIS) 20 MG tablet Take 1 tablet (20 mg total) by mouth daily as needed for erectile dysfunction.  3 tablet  0  . aspirin EC 81 MG tablet Take 81 mg by mouth daily.      . potassium chloride (KLOR-CON) 20 MEQ packet Take 20 mEq by mouth daily.  90 tablet  3     Review of Systems Per HPI    Objective:   Physical Exam  Vitals reviewed. Constitutional: He appears  well-developed and well-nourished. No distress.  HENT:  Head: Normocephalic and atraumatic.  Mouth/Throat: Oropharynx is clear and moist. No oropharyngeal exudate.  Eyes: Conjunctivae and EOM are normal. Pupils are equal, round, and reactive to light. No scleral icterus.  Neck: Normal range of motion. Neck supple.  Cardiovascular: Normal rate, regular rhythm and intact distal pulses.   Murmur (2/6 SEM) heard. Pulmonary/Chest: Breath sounds normal. No respiratory distress. He has no wheezes. He has no rales.  Abdominal: Soft. There is no tenderness.       No abd/renal bruits  Musculoskeletal: He exhibits edema (tight, trace pedal edema).  Skin: Skin is warm and dry. No rash noted.       Assessment & Plan:

## 2011-08-29 NOTE — Assessment & Plan Note (Signed)
Has appt with Dr. Elease Hashimoto next month. Increase ACEI to 20mg  lisinopril. Continue beta blocker. Not taking aspirin or K.  rec start these. May benefit from spironolactone, held off on new medicine today but discussed.

## 2011-08-29 NOTE — Assessment & Plan Note (Signed)
Chronic, uncontrolled. Reports compliance with meds. Provided with glucometer today so he can check sugars at home. Have discussed insulin in past as with CHF not candidate for actos or possibly [place]. Check A1c today. Pneumonia shot today.

## 2011-08-29 NOTE — Assessment & Plan Note (Signed)
Still elevated.  Will increase lisinopril to 20mg  qd today. Continue lasix, coreg at current doses. rec take lasix in am and at 2pm to prevent voiding issues he's having at night.

## 2011-08-30 ENCOUNTER — Encounter: Payer: Self-pay | Admitting: Family Medicine

## 2011-09-06 ENCOUNTER — Encounter: Payer: Self-pay | Admitting: *Deleted

## 2011-09-26 ENCOUNTER — Telehealth: Payer: Self-pay | Admitting: *Deleted

## 2011-09-26 NOTE — Telephone Encounter (Signed)
Patient called after receiving my letter that I needed to speak to him. He said he is taking meds as prescribed. He said he hasn't had any problems tolerating them. He did say he's still not feeling like himself. He is fatigued and just doesn't feel good. He said his feet and ankles are swollen. They are normal size first thing in the morning and by bedtime they are very swollen. His next appt isn't until June, so I advised for him to come in earlier. He has to check his work schedule and will call me back tomorrow when he can schedule something.

## 2011-09-27 ENCOUNTER — Ambulatory Visit (INDEPENDENT_AMBULATORY_CARE_PROVIDER_SITE_OTHER): Payer: 59 | Admitting: Cardiovascular Disease

## 2011-09-27 ENCOUNTER — Encounter: Payer: Self-pay | Admitting: Cardiovascular Disease

## 2011-09-27 VITALS — BP 154/92 | HR 86 | Ht 71.0 in | Wt 250.0 lb

## 2011-09-27 DIAGNOSIS — I5042 Chronic combined systolic (congestive) and diastolic (congestive) heart failure: Secondary | ICD-10-CM

## 2011-09-27 DIAGNOSIS — I509 Heart failure, unspecified: Secondary | ICD-10-CM

## 2011-09-27 LAB — BASIC METABOLIC PANEL
BUN: 14 mg/dL (ref 6–23)
Chloride: 101 mEq/L (ref 96–112)
Creatinine, Ser: 1 mg/dL (ref 0.4–1.5)
Glucose, Bld: 281 mg/dL — ABNORMAL HIGH (ref 70–99)
Potassium: 3.8 mEq/L (ref 3.5–5.1)

## 2011-09-27 LAB — BRAIN NATRIURETIC PEPTIDE: Pro B Natriuretic peptide (BNP): 568 pg/mL — ABNORMAL HIGH (ref 0.0–100.0)

## 2011-09-27 MED ORDER — SPIRONOLACTONE 25 MG PO TABS: 25.0000 mg | ORAL_TABLET | Freq: Every day | ORAL | Status: DC

## 2011-09-27 MED ORDER — CARVEDILOL 12.5 MG PO TABS: 12.5000 mg | ORAL_TABLET | Freq: Two times a day (BID) | ORAL | Status: DC

## 2011-09-27 NOTE — Assessment & Plan Note (Addendum)
Johnathan Archer presents with worsening congestive heart failure symptoms. He has a history of a dilated cardiomyopathy from 2004. He's had a cardiac catheterization which revealed nonobstructive coronary artery disease.  He's had a rough several months. He developed pneumonia. He is blood pressure has gone up. He also has had very poor control of his diabetes. He's noticed lots more fatigue and profound leg swelling. He also has abdominal swelling and ascites.  I've recommended that we increase his Lasix to 80 mg twice a day for the next 3 days. Hopefully this will jump start some of his diuresis. We'll add Aldactone 25 mg a day. We'll increase his carvedilol to 12.5 mg twice a day. His blood pressure is elevated but he notes that he did not take his lisinopril this morning. I told him it's important that he not miss any medications.  We'll check some baseline labs today Tuesday including a basic metabolic profile and a BNP. I'll see him back in 3 weeks for a basic metabolic profile and BMP.  Have given him a brochure for the lounge Dr. Peggye Form recommended that he wear compression hose and elevate his leg several times through the day.  I spent a long time with him reviewing the pathophysiology of congestive heart failure. He was completely unclear as to why he had all of his leg edema. I think that he now understands why he is having no leg swelling.

## 2011-09-27 NOTE — Patient Instructions (Signed)
Your physician recommends that you return for lab work in: TODAY AND IN 3 WEEKS  Your physician recommends that you schedule a follow-up appointment in: 3 WEEKS   Your physician has recommended you make the following change in your medication:   Increase coreg to 12.5 mg one tablet twice a day 12 hours a day Increase lasix to 80 mg twice a day for 3 days then return to usual dose of 40 mg twice a day Start aldactone 25 mg daily for  Potassium sparing heart failure med,  Watch your sodium/ weights daily/ read new heart failure pamphlet given, see you in three weeks!

## 2011-09-27 NOTE — Progress Notes (Signed)
Johnathan Archer Date of Birth  26-Feb-1971 [facility]     [place] Office  [address]    [address]   [address] [place], [place]  [address]    [place], [place]  [address] [phone]  Fax  [phone]  [phone]  Fax [phone]  Problems: 1. Congestive heart failure, EF equals 35%, nonobstructive coronary artery disease by cath in 2004 ( former patient of Donnie Aho) 2. Hypertension 3. Diabetes mellitus-type II 4. Sleep apnea  History of Present Illness:  Johnathan Archer to 41 year old gentleman with a history of idiopathic dilated cardiomyopathy since 2004. He had a cardiac catheterization at that time which revealed nonobstructive coronary artery disease.  He's been on medical therapy since that time.  Recently he's noticed lots of fatigue and also lots of leg swelling.  He does avoid eating fried foods or salty foods.  Current Outpatient Prescriptions on File Prior to Visit  Medication Sig Dispense Refill  . aspirin EC 81 MG tablet Take 81 mg by mouth daily.      . Blood Glucose Monitoring Suppl (CONTOUR BLOOD GLUCOSE SYSTEM) W/DEVICE KIT by Does not apply route as directed.      . carvedilol (COREG) 6.25 MG tablet Take 1 tablet (6.25 mg total) by mouth 2 (two) times daily with a meal.  180 tablet  3  . furosemide (LASIX) 40 MG tablet Take 1 tablet (40 mg total) by mouth 2 (two) times daily.  180 tablet  3  . glipiZIDE (GLUCOTROL) 10 MG tablet Take 1 tablet (10 mg total) by mouth 2 (two) times daily before a meal.  60 tablet  3  . glucose blood test strip Contour      . Lancets MISC by Does not apply route. Contour      . lisinopril (PRINIVIL,ZESTRIL) 20 MG tablet Take 1 tablet (20 mg total) by mouth daily.  90 tablet  3  . metFORMIN (GLUCOPHAGE) 1000 MG tablet Take 1 tablet (1,000 mg total) by mouth 2 (two) times daily with a meal.  180 tablet  3  . tadalafil (CIALIS) 20 MG tablet Take 1 tablet (20 mg total) by mouth daily as needed for erectile  dysfunction.  3 tablet  0    No Known Allergies  Past Medical History  Diagnosis Date  . Hypertension   . Type II or unspecified type diabetes mellitus without mention of complication, uncontrolled   . Systolic and diastolic CHF, chronic 2004    idiopathic dilated CM, thought viral.  cath 2004 negative for obstruction, last echo 07/2011 EF 35%  . History of chicken pox   . OSA (obstructive sleep apnea)   . History of pneumonia     2006 LLL PNA, 06/2011 RLL PNA  . Mitral regurgitation 2006    mild-mod at last echo  . ED (erectile dysfunction)     Past Surgical History  Procedure Date  . Hernia repair 2002  . Knee surgery     L knee in HS  . Cardiac catheterization 2004    no blockages  . US echocardiography 07/2011    mod LVH, EF 35%, diffuse hypokinesis of entire myocardium, irreversible restrictive pattern (grade 4 diastolic dysfunction), mod MR, mod dilated LA/RA, decreased RV systolic fxn, no PFO    History  Smoking status  . Never Smoker   Smokeless tobacco  . Never Used    History  Alcohol Use  . Yes    Occasional 1x/mo    Family History  Problem Relation Age of Onset  . Diabetes Mother   . Hypertension Father   . Stroke Neg Hx   . Coronary artery disease Neg Hx   . Cancer Neg Hx     Reviw of Systems:  Reviewed in the HPI.  All other systems are negative.  Physical Exam: Blood pressure 154/92, pulse 86, height 5\' 11"  (1.803 m), weight 250 lb (113.399 kg). General: Well developed, well nourished, in no acute distress.  Head: Normocephalic, atraumatic, sclera non-icteric, mucus membranes are moist,   Neck: Supple. Carotids are 2 + without bruits. No JVD  Lungs: Clear bilaterally to auscultation.  Heart: regular rate.  normal  S1 S2. No murmurs, gallops or rubs.  Abdomen: Soft, non-tender, non-distended with normal bowel sounds. No hepatomegaly. No rebound/guarding. No masses.  Msk:  Strength and tone are normal  Extremities: No clubbing or  cyanosis. No edema.  Distal pedal pulses are 2+ and equal bilaterally.  Neuro: Alert and oriented X 3. Moves all extremities spontaneously.  Psych:  Responds to questions appropriately with a normal affect.  ECG:  Assessment / Plan:

## 2011-09-28 NOTE — Telephone Encounter (Signed)
Noted. Thanks.  Saw cards yesterda.

## 2011-10-19 DIAGNOSIS — I480 Paroxysmal atrial fibrillation: Secondary | ICD-10-CM

## 2011-10-19 DIAGNOSIS — I4891 Unspecified atrial fibrillation: Secondary | ICD-10-CM

## 2011-10-19 HISTORY — DX: Paroxysmal atrial fibrillation: I48.0

## 2011-10-19 HISTORY — DX: Unspecified atrial fibrillation: I48.91

## 2011-10-20 ENCOUNTER — Ambulatory Visit: Payer: 59 | Admitting: Cardiovascular Disease

## 2011-11-10 ENCOUNTER — Emergency Department (HOSPITAL_COMMUNITY)
Admission: EM | Admit: 2011-11-10 | Discharge: 2011-11-10 | Disposition: A | Discharge status: Home / Self Care | Payer: 59 | Attending: Emergency Medicine | Admitting: Emergency Medicine

## 2011-11-10 ENCOUNTER — Encounter (HOSPITAL_COMMUNITY): Payer: Self-pay | Admitting: *Deleted

## 2011-11-10 ENCOUNTER — Emergency Department (HOSPITAL_COMMUNITY): Payer: Self-pay

## 2011-11-10 DIAGNOSIS — G4733 Obstructive sleep apnea (adult) (pediatric): Secondary | ICD-10-CM

## 2011-11-10 DIAGNOSIS — I4891 Unspecified atrial fibrillation: Secondary | ICD-10-CM

## 2011-11-10 DIAGNOSIS — E1165 Type 2 diabetes mellitus with hyperglycemia: Secondary | ICD-10-CM

## 2011-11-10 DIAGNOSIS — E119 Type 2 diabetes mellitus without complications: Secondary | ICD-10-CM | POA: Insufficient documentation

## 2011-11-10 DIAGNOSIS — I10 Essential (primary) hypertension: Secondary | ICD-10-CM

## 2011-11-10 DIAGNOSIS — I5042 Chronic combined systolic (congestive) and diastolic (congestive) heart failure: Secondary | ICD-10-CM

## 2011-11-10 HISTORY — DX: Unspecified atrial fibrillation: I48.91

## 2011-11-10 HISTORY — DX: Long term (current) use of anticoagulants: Z79.01

## 2011-11-10 LAB — DRUGS OF ABUSE SCREEN W/O ALC, ROUTINE URINE
Amphetamine Screen, Ur: NEGATIVE
Barbiturate Quant, Ur: NEGATIVE
Cocaine Metabolites: NEGATIVE
Creatinine,U: 17.2 mg/dL
Phencyclidine (PCP): NEGATIVE
Propoxyphene: NEGATIVE

## 2011-11-10 LAB — GLUCOSE, CAPILLARY
Glucose-Capillary: 454 mg/dL — ABNORMAL HIGH (ref 70–99)
Glucose-Capillary: 350 mg/dL — ABNORMAL HIGH (ref 70–99)

## 2011-11-10 LAB — CBC
HCT: 46.1 % (ref 39.0–52.0)
Hemoglobin: 16.3 g/dL (ref 13.0–17.0)
RBC: 5.22 MIL/uL (ref 4.22–5.81)
WBC: 6.7 10*3/uL (ref 4.0–10.5)

## 2011-11-10 LAB — POCT I-STAT TROPONIN I: Troponin i, poc: 0.04 ng/mL (ref 0.00–0.08)

## 2011-11-10 LAB — DIFFERENTIAL
Basophils Relative: 1 % (ref 0–1)
Lymphocytes Relative: 34 % (ref 12–46)
Monocytes Absolute: 0.5 10*3/uL (ref 0.1–1.0)
Monocytes Relative: 8 % (ref 3–12)
Neutro Abs: 3.6 10*3/uL (ref 1.7–7.7)
Neutrophils Relative %: 54 % (ref 43–77)

## 2011-11-10 LAB — COMPREHENSIVE METABOLIC PANEL
BUN: 22 mg/dL (ref 6–23)
CO2: 30 mEq/L (ref 19–32)
Calcium: 9.7 mg/dL (ref 8.4–10.5)
Creatinine, Ser: 1.15 mg/dL (ref 0.50–1.35)
GFR calc Af Amer: 90 mL/min — ABNORMAL LOW (ref >90)
GFR calc non Af Amer: 78 mL/min — ABNORMAL LOW (ref >90)
Glucose, Bld: 500 mg/dL — ABNORMAL HIGH (ref 70–99)
Sodium: 137 mEq/L (ref 135–145)
Total Protein: 8.1 g/dL (ref 6.0–8.3)

## 2011-11-10 MED ORDER — DILTIAZEM HCL 100 MG IV SOLR
20.0000 mg/h | Freq: Once | INTRAVENOUS | Status: AC
  Administered 2011-11-10: 20 mg/h via INTRAVENOUS

## 2011-11-10 MED ORDER — INSULIN ASPART 100 UNIT/ML ~~LOC~~ SOLN
20.0000 [IU] | Freq: Once | SUBCUTANEOUS | Status: AC
  Administered 2011-11-10: 20 [IU] via SUBCUTANEOUS
  Filled 2011-11-10: qty 1

## 2011-11-10 MED ORDER — INSULIN ASPART 100 UNIT/ML ~~LOC~~ SOLN
10.0000 [IU] | Freq: Once | SUBCUTANEOUS | Status: AC
  Administered 2011-11-10: 10 [IU] via SUBCUTANEOUS
  Filled 2011-11-10: qty 1

## 2011-11-10 MED ORDER — DILTIAZEM HCL 25 MG/5ML IV SOLN
INTRAVENOUS | Status: AC
  Administered 2011-11-10: 20 mg via INTRAVENOUS
  Filled 2011-11-10: qty 5

## 2011-11-10 MED ORDER — GLIPIZIDE 10 MG PO TABS
10.0000 mg | ORAL_TABLET | ORAL | Status: AC
  Administered 2011-11-10: 10 mg via ORAL
  Filled 2011-11-10: qty 1

## 2011-11-10 MED ORDER — WARFARIN SODIUM 5 MG PO TABS: 5.0000 mg | ORAL_TABLET | Freq: Every day | ORAL | Status: DC

## 2011-11-10 MED ORDER — METFORMIN HCL 500 MG PO TABS
1000.0000 mg | ORAL_TABLET | Freq: Once | ORAL | Status: DC
  Filled 2011-11-10: qty 2

## 2011-11-10 MED ORDER — DILTIAZEM HCL 25 MG/5ML IV SOLN
20.0000 mg | Freq: Once | INTRAVENOUS | Status: AC
  Administered 2011-11-10: 20 mg via INTRAVENOUS

## 2011-11-10 NOTE — ED Provider Notes (Signed)
Cardiology evaluated the patient and felt that he was safe for discharge home. They did want the patient started on Coumadin. A prescription was provided by their service for that medication. They did not want the patient to be given a dose of this year as they wanted it to encourage him to get his Coumadin as well as his other medications filled. Patient was discharged in good condition with followup appointment with cardiology.  The primary encounter diagnosis was Atrial fibrillation with RVR. Diagnoses of Type 2 diabetes mellitus with hyperglycemia, Hypertension, OSA (obstructive sleep apnea), and Systolic and diastolic CHF, chronic were also pertinent to this visit.   Cyndra Numbers, MD 11/11/11 [phone]

## 2011-11-10 NOTE — ED Notes (Signed)
Cards at bedside

## 2011-11-10 NOTE — Consult Note (Signed)
Cardiology Consult Note  Patient ID: Johnathan Archer MRN: [id number], DOB: Aug 27, 1970 Date of Encounter: 11/10/2011, 6:44 AM Primary Physician: Johnathan Boyden, MD, MD Primary Cardiologist: Dr. 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 He reports being out of his medications for 4-5 days (they are at his pharmacy, he just hasn't picked them up but he lost his job about 3 weeks ago). He awoke at 5am to go get a drink of water and when he returned to the bed he noticed onset of tachypalpitations near the apex of the heart. He denies any CP, SOB, nausea, vomiting, diaphoresis, presyncope, syncope or any other symptoms with these palpitations. He wasn't sure what was happening so came to the ER to be on the safe side. EKG confirmed AF-RVR 120s-120s. BP was up to 235/155. He was treated with 20mg  IV bolus of diltiazem followed by a 20mg /hr gtt with subsequent conversion to NSR at 5:50am. He remains in NSR off drips. Labs are pertinent for a glucose of 500, pBNP 809. He was given 10 units of insulin and has been written to get his10mg  glucotrol tablet by the ER. He otherwise feels well without complaints. He last saw Dr. Elease Archer in April 2013 for volume overload at which time Coreg was increased, Lasix was temporarily increased, and spironolactone was added. He reports marked improvement after these changes. Even being off meds the last few days, he denies any return to those symptoms including no orthopnea, PND, LEE, weight gain, or SOB.  Aside from not taking his medicines for the last few days, he cannot identify any other difference in usual habits. He denies any alcohol or illicit drug use. He does not smoke. He reports a low-salt diet and had chicken and  vegetables last night, but is wondering if the lemonade he had contributed to such a high sugar.   Past Medical History  Diagnosis Date  . Hypertension   . Type II or unspecified type diabetes mellitus without mention of complication, uncontrolled   . Systolic and diastolic CHF, chronic 2004    idiopathic dilated CM, 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  cath 2004 negative for obstruction, last echo 07/2011 EF 35%  . History of chicken pox   . OSA (obstructive sleep apnea)   . History of pneumonia     2006 LLL PNA, 06/2011 RLL PNA  . Mitral regurgitation 2006    mild-mod at last echo  . ED (erectile dysfunction)      Most Recent Cardiac Studies: 2D Echo 08/16/11 Study Conclusions - Left ventricle: The cavity size was mildly dilated. Wall thickness was increased in a pattern of moderate LVH. The estimated ejection fraction was 35%. Diffuse hypokinesis. Hypokinesis of the entire myocardium. Doppler parameters are consistent with an irreversible restrictive pattern, indicative of decreased left ventricular diastolic compliance and/or increased left atrial pressure (grade 4 diastolic dysfunction). - Mitral valve: Moderate regurgitation. - Left atrium: The atrium was mildly to moderately dilated. - Right ventricle: Systolic function was reduced. - Right atrium: The atrium was mildly dilated. - Atrial septum: No defect or patent foramen ovale was identified. - Pulmonary arteries: PA peak pressure: 51mm Hg (S). - Pericardium, extracardiac: Possible left pleural effusion  Cardiac Cath 2004 HEMODYNAMIC DATA:  1. Aorta post contrast 122/22-35.  2. LV post contrast 122/90.  ANGIOGRAPHIC DATA: Left ventriculogram performed in the 30 degree RAO  projection. The aortic valve is normal. The mitral valve is normal. Left  ventricle was dilated with severe diffuse hypokinesis noted. The estimated  ejection fraction is 15%. Coronary arteries arise and distribute normally.  Left main coronary  artery is normal.  Left anterior descending normal.  Circumflex coronary artery normal.  Right coronary artery normal.  IMPRESSION:  1. Severe diffuse hypokinesis with normal coronary arteries compatible with  a cardiomyopathy.  2. Increased left ventricular end-diastolic pressure.  3. No significant coronary artery disease identified.    Surgical History:  Past Surgical History  Procedure Date  . Hernia repair 2002  . Knee surgery     L knee in HS  . Cardiac catheterization 2004    no blockages  . US echocardiography 07/2011    mod LVH, EF 35%, diffuse hypokinesis of entire myocardium, irreversible restrictive pattern (grade 4 diastolic dysfunction), mod MR, mod dilated LA/RA, decreased RV systolic fxn, no PFO     Home Meds: Prior to Admission medications   Medication Sig Start Date End Date Taking? Authorizing Provider  Blood Glucose Monitoring Suppl (CONTOUR BLOOD GLUCOSE SYSTEM) W/DEVICE KIT by Does not apply route as directed.   Yes Historical Provider, MD  carvedilol (COREG) 12.5 MG tablet Take 1 tablet (12.5 mg total) by mouth 2 (two) times daily with a meal. 09/27/11 09/26/12 Yes Johnathan Mixer, MD  furosemide (LASIX) 40 MG tablet Take 1 tablet (40 mg total) by mouth 2 (two) times daily. 08/01/11 07/31/12 Yes Johnathan Boyden, MD  glipiZIDE (GLUCOTROL) 10 MG tablet Take 1 tablet (10 mg total) by mouth 2 (two) times daily before a meal. 07/27/11 07/26/12 Yes Johnathan Boyden, MD  glucose blood test strip Contour 06/28/11 06/27/12 Yes Johnathan Boyden, MD  Lancets MISC by Does not apply route. Contour   Yes Historical Provider, MD  lisinopril (PRINIVIL,ZESTRIL) 20 MG tablet Take 1 tablet (20 mg total) by mouth daily. 08/29/11 08/28/12 Yes Johnathan Boyden, MD  metFORMIN (GLUCOPHAGE) 1000 MG tablet Take 1 tablet (1,000 mg total) by mouth 2 (two) times daily with a meal. 08/01/11 07/31/12 Yes Johnathan Boyden, MD  spironolactone (ALDACTONE) 25 MG tablet Take 1 tablet (25 mg total) by mouth daily.  09/27/11 09/26/12 Yes Johnathan Mixer, MD    Allergies: No Known Allergies  History   Social History  . Marital Status: Married    Spouse Name: [name]    Number of Children: [name]  . Years of Education: [name]   Occupational History  . Not on file.   Social History Main Topics  . Smoking status: Never Smoker   . Smokeless tobacco: Never Used  . Alcohol Use: Yes     Occasional 1x/mo  . Drug Use: No  . Sexually Active: Not on file   Other Topics Concern  . 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 Diabetes Mother   . Hypertension Father   . Stroke Neg Hx   . Coronary artery disease Neg Hx   . Cancer Neg Hx     Review of Systems: General: negative for chills, fever, night sweats or weight changes.  Cardiovascular: see above Dermatological: negative for rash  Respiratory: negative for cough or wheezing Urologic: negative for hematuria Abdominal: negative for nausea, vomiting, diarrhea, bright red blood per rectum, melena, or hematemesis Neurologic: negative for visual changes, syncope, or dizziness All other systems reviewed and are otherwise negative except as noted above.  Labs:   Lab Results  Component Value Date   WBC 6.7 11/10/2011   HGB 16.3 11/10/2011   HCT 46.1 11/10/2011   MCV 88.3 11/10/2011   PLT 217 11/10/2011    Lab 11/10/11 0523  NA 137  K 4.0  CL 95*  CO2 30  BUN 22  CREATININE 1.15  CALCIUM 9.7  PROT 8.1  BILITOT 0.6  ALKPHOS 69  ALT 15  AST 26  GLUCOSE 500*   Radiology/Studies:  1. Chest Portable 1 View 11/10/2011  *RADIOLOGY REPORT*  Clinical Data: Tachycardia, palpitations.  PORTABLE CHEST - 1 VIEW  Comparison: Multiple priors, most recent 06/26/2011  Findings: Heart size upper normal limits.  Bihilar fullness  likely represents vasculature, similar to priors.  No definite focal consolidation, pleural effusion, or pneumothorax.  The lung bases are partially obscured by respiratory effort/diaphragm elevation. No acute osseous finding identified.  IMPRESSION: Heart size upper normal limits.  Mild central vascular fullness. No overt edema or focal consolidation.  Lung bases are partially obscured.  Consider follow-up with PA and lateral radiographs to better characterize.  Original Report Authenticated By: Waneta Martins, M.D.    EKG:  11/10/11 at 4:55am - atrial fib with RVR - coarse atrial activity - 129bpm. TWI V5-V6 11/10/11 at 6:00am - NSR general TW flanneing III, avF. Biphasic appearing T's in V5-V6. LVH present. There is no significant change from 06/2011.  Physical Exam: Blood pressure 139/88, pulse 91, resp. rate 21, SpO2 96.00%. General: Well developed, well nourished AAM in no acute distress. Head: Normocephalic, atraumatic, sclera non-icteric, no xanthomas, nares are without discharge.  Neck: Negative for carotid bruits. JVD not elevated. Lungs: Clear bilaterally to auscultation without wheezes, rales, or rhonchi. Breathing is unlabored. Heart: RRR   with S1 S2. No murmurs, rubs, or gallops appreciated. Abdomen: Soft, non-tender, non-distended with normoactive bowel sounds. No hepatomegaly. No rebound/guarding. No obvious abdominal masses. Msk:  Strength and tone appear normal for age. Extremities: No clubbing or cyanosis. No edema.  Distal pedal pulses are 2+ and equal bilaterally. Neuro: Alert and oriented X 3. Moves all extremities spontaneously. Psych:  Responds to questions appropriately with a normal affect.   ASSESSMENT AND PLAN:   1. Newly diagnosed atrial fibrillation with RVR, currently in NSR 2. Chronic mixed systolic/diastolic CHF, currently euvolemic 3. Diabetes mellitus with marked hyperglycemia likely due to noncompliance/possibly acute stress of AFRVR 4. Recent  noncompliance (out of meds for 4-5 days) 5. HTN  Mr. Takahashi is currently maintaining NSR off of any gtts and blood pressure has improved. He is well-appearing and not volume overloaded. We would recommend for him to resume his prior home medication regimen, with the addition of anticoagulation given his CHADS2 score of 3 (HTN, CHF, DM). The patient lost his job/insurance several weeks ago so Pradaxa/Xarelto are not financially feasible at present. We will initiate Coumadin at 5mg  daily and have his INR checked in our office on Tuesday. Compliance with prior medications was stressed (the patient states they are all at the pharmacy ready but he just hasn't picked them up).  Our office will call him with his INR check as well as follow-up appointment with Dr. Elease Archer. The ER doctor was notified of this plan; they will be  following up with his hyperglycemia this morning to ensure a lower level for discharge.  Medication List  As of 11/10/2011  8:03 AM   START taking these medications         warfarin 5 MG tablet   Commonly known as: COUMADIN   Take 1 tablet (5 mg total) by mouth daily.         CONTINUE taking these medications         carvedilol 12.5 MG tablet   Commonly known as: COREG   Take 1 tablet (12.5 mg total) by mouth 2 (two) times daily with a meal.      Contour Blood Glucose System W/DEVICE Kit      furosemide 40 MG tablet   Commonly known as: LASIX   Take 1 tablet (40 mg total) by mouth 2 (two) times daily.      glipiZIDE 10 MG tablet   Commonly known as: GLUCOTROL   Take 1 tablet (10 mg total) by mouth 2 (two) times daily before a meal.      glucose blood test strip      Lancets Misc      lisinopril 20 MG tablet   Commonly known as: PRINIVIL,ZESTRIL   Take 1 tablet (20 mg total) by mouth daily.      metFORMIN 1000 MG tablet   Commonly known as: GLUCOPHAGE   Take 1 tablet (1,000 mg total) by mouth 2 (two) times daily with a meal.      spironolactone 25 MG tablet    Commonly known as: ALDACTONE   Take 1 tablet (25 mg total) by mouth daily.          Where to get your medications    These are the prescriptions that you need to pick up. We sent them to a specific pharmacy, so you will need to go there to get them.   [facility] [address] [place], [place] - [address]    [address] [place] [address]    Phone: [phone]        warfarin 5 MG tablet           I gave him an RX for 60 tabs for Coumadin in case his dose needs eventual adjusting.  Signed, Ronie Spies PA-C 11/10/2011, 6:44 AM    I have seen, examined the patient, and reviewed the above assessment and plan.  Changes were made to above where necessary.  Briefly, pt with known CHF (nonischemic).   He recently lost his job and no longer has insurance.  He has been without his medicines for several days but has actually been feeling well.  He woke this am with tachypalpitations.  Denies any other associated symptoms.  He presents to [facility] with afib with RVR.  With IV cardizem, he has returned to sinus rhythm.  He is clear that his afib is < 24 hours in duration.  On exam, he is well appearing.  Not volume overloaded. As he has returned to baseline, we will discharge to home.  The importance of compliance with medicines is stressed today.  He will try to get on his wife's insurance plan soon. Given his elevated CHADS2 score (3- DM, HTN, CHF), he warrants long term anticoagulation.  We discussed coumadin and also the novel agents.  As he presently has no insurance, he cannot afford the novel agents. I will therefore start coumadin today.  He will need an INR check on Tuesday. He will also  need to follow closely in the office with Dr Johnathan Archer.  Co Sign: Hillis Range, MD 11/10/2011 8:40 AM

## 2011-11-10 NOTE — ED Provider Notes (Addendum)
History     CSN: [id number]  Arrival date & time 11/10/11  0449   First MD Initiated Contact with Patient 11/10/11 0500      Chief Complaint  Patient presents with  . Tachycardia  . Palpitations    (Consider location/radiation/quality/duration/timing/severity/associated sxs/prior treatment) HPI 41 year old male presents emergency department with complaint of palpitations. Patient reports he woke up around an hour ago to get a drink of water and noticed that he was having a racing irregular heartbeat. Patient denies chest pain, no shortness of breath. Patient has history of hypertension diabetes and congestive heart failure. No prior history of atrial fibrillation. Patient denies alcohol, smoking, drugs. He has had no unilateral leg swelling or pain. He reports his chronic edema from CHF is stable. He has had no change in his medications.  Past Medical History  Diagnosis Date  . Hypertension   . Type II or unspecified type diabetes mellitus without mention of complication, uncontrolled   . Systolic and diastolic CHF, chronic 2004    idiopathic dilated CM, thought viral.  cath 2004 negative for obstruction, last echo 07/2011 EF 35%  . History of chicken pox   . OSA (obstructive sleep apnea)   . History of pneumonia     2006 LLL PNA, 06/2011 RLL PNA  . Mitral regurgitation 2006    mild-mod at last echo  . ED (erectile dysfunction)     Past Surgical History  Procedure Date  . Hernia repair 2002  . Knee surgery     L knee in HS  . Cardiac catheterization 2004    no blockages  . US echocardiography 07/2011    mod LVH, EF 35%, diffuse hypokinesis of entire myocardium, irreversible restrictive pattern (grade 4 diastolic dysfunction), mod MR, mod dilated LA/RA, decreased RV systolic fxn, no PFO    Family History  Problem Relation Age of Onset  . Diabetes Mother   . Hypertension Father   . Stroke Neg Hx   . Coronary artery disease Neg Hx   . Cancer Neg Hx     History    Substance Use Topics  . Smoking status: Never Smoker   . Smokeless tobacco: Never Used  . Alcohol Use: Yes     Occasional 1x/mo      Review of Systems  All other systems reviewed and are negative.    Allergies  Review of patient's allergies indicates no known allergies.  Home Medications   Current Outpatient Rx  Name Route Sig Dispense Refill  . CONTOUR BLOOD GLUCOSE SYSTEM W/DEVICE KIT Does not apply by Does not apply route as directed.    Marland Kitchen CARVEDILOL 12.5 MG PO TABS Oral Take 1 tablet (12.5 mg total) by mouth 2 (two) times daily with a meal. 180 tablet 3  . FUROSEMIDE 40 MG PO TABS Oral Take 1 tablet (40 mg total) by mouth 2 (two) times daily. 180 tablet 3  . GLIPIZIDE 10 MG PO TABS Oral Take 1 tablet (10 mg total) by mouth 2 (two) times daily before a meal. 60 tablet 3  . GLUCOSE BLOOD VI STRP  Contour    . LANCETS MISC Does not apply by Does not apply route. Contour    . LISINOPRIL 20 MG PO TABS Oral Take 1 tablet (20 mg total) by mouth daily. 90 tablet 3  . METFORMIN HCL 1000 MG PO TABS Oral Take 1 tablet (1,000 mg total) by mouth 2 (two) times daily with a meal. 180 tablet 3  . SPIRONOLACTONE 25 MG  PO TABS Oral Take 1 tablet (25 mg total) by mouth daily. 30 tablet 5    BP 235/155  Pulse 133  Resp 18  SpO2 96%  Physical Exam  Nursing note and vitals reviewed. Constitutional: He appears well-developed and well-nourished. He appears distressed.       Patient with significant hypertension and tachycardia noted  HENT:  Head: Normocephalic and atraumatic.  Nose: Nose normal.  Mouth/Throat: Oropharynx is clear and moist. No oropharyngeal exudate.  Eyes: Conjunctivae and EOM are normal. Pupils are equal, round, and reactive to light.  Neck: Normal range of motion. Neck supple. No JVD present. No tracheal deviation present. No thyromegaly present.  Cardiovascular: Normal heart sounds and intact distal pulses.  Exam reveals no gallop and no friction rub.   No murmur  heard.      Tachycardia with irregular rhythm  Pulmonary/Chest: No stridor. No respiratory distress. He has no wheezes. He has no rales. He exhibits no tenderness.       Breath sounds diminished in the bases  Abdominal: Soft. Bowel sounds are normal. He exhibits distension. He exhibits no mass. There is no tenderness. There is no rebound and no guarding.  Musculoskeletal: Normal range of motion. He exhibits edema (trace edema). He exhibits no tenderness.  Lymphadenopathy:    He has no cervical adenopathy.  Neurological: He exhibits normal muscle tone. Coordination normal.  Skin: Skin is warm and dry. No rash noted. He is not diaphoretic. No erythema. No pallor.  Psychiatric: He has a normal mood and affect. His behavior is normal. Judgment and thought content normal.    ED Course  Procedures (including critical care time)   Labs Reviewed  POCT I-STAT TROPONIN I  COMPREHENSIVE METABOLIC PANEL  CBC  DIFFERENTIAL  PRO B NATRIURETIC PEPTIDE   No results found.   Date: 11/10/2011  Rate: 129  Rhythm: atrial fibrillation  QRS Axis: normal  Intervals: afib  ST/T Wave abnormalities: nonspecific T wave changes  Conduction Disutrbances:none  Narrative Interpretation: afib is new from prior.  LVH noted.  Old EKG Reviewed: changes noted   1. Atrial fibrillation with RVR   2. Type 2 diabetes mellitus with hyperglycemia       MDM  Patient with new A. fib with RVR. We'll try to control rate with Cardizem. Patient followed by [facility]. Will have them evaluate patient as well.      6:21 AM Patient has converted back to normal sinus. Will DC Cardizem drip. Patient noted be hyper glycemic. He reports he's been out of his diabetic medicines for the last few days. Blood pressure is much improved. Discussed with on-call cardiologist Dr. Johney Frame, and someone from the group will come to see the patient in consult.  Olivia Mackie, MD 11/10/11 [phone]  Olivia Mackie, MD 11/10/11  [phone]   Date: 11/10/2011  Rate: 88  Rhythm: normal sinus rhythm  QRS Axis: normal  Intervals: normal  ST/T Wave abnormalities: nonspecific T wave changes  Conduction Disutrbances:none  Narrative Interpretation: afib resolved.  LVH noted  Old EKG Reviewed: changes noted   Olivia Mackie, MD 11/10/11 [phone]

## 2011-11-10 NOTE — ED Notes (Signed)
The patient's CBG was 350 at this time.

## 2011-11-10 NOTE — Discharge Instructions (Signed)
Atrial Fibrillation Your caregiver has diagnosed you with atrial fibrillation (AFib). The heart normally beats very regularly; AFib is a type of irregular heartbeat. The heart rate may be faster or slower than normal. This can prevent your heart from pumping as well as it should. AFib can be constant (chronic) or intermittent (paroxysmal). CAUSES  Atrial fibrillation may be caused by:  Heart disease, including heart attack, coronary artery disease, heart failure, diseases of the heart valves, and others.   Blood clot in the lungs (pulmonary embolism).   Pneumonia or other infections.   Chronic lung disease.   Thyroid disease.   Toxins. These include alcohol, some medications (such as decongestant medications or diet pills), and caffeine.  In some people, no cause for AFib can be found. This is referred to as Lone Atrial Fibrillation. SYMPTOMS   Palpitations or a fluttering in your chest.   A vague sense of chest discomfort.   Shortness of breath.   Sudden onset of lightheadedness or weakness.  Sometimes, the first sign of AFib can be a complication of the condition. This could be a stroke or heart failure. DIAGNOSIS  Your description of your condition may make your caregiver suspicious of atrial fibrillation. Your caregiver will examine your pulse to determine if fibrillation is present. An EKG (electrocardiogram) will confirm the diagnosis. Further testing may help determine what caused you to have atrial fibrillation. This may include chest x-ray, echocardiogram, blood tests, or CT scans. PREVENTION  If you have previously had atrial fibrillation, your caregiver may advise you to avoid substances known to cause the condition (such as stimulant medications, and possibly caffeine or alcohol). You may be advised to use medications to prevent recurrence. Proper treatment of any underlying condition is important to help prevent recurrence. PROGNOSIS  Atrial fibrillation does tend to  become a chronic condition over time. It can cause significant complications (see below). Atrial fibrillation is not usually immediately life-threatening, but it can shorten your life expectancy. This seems to be worse in women. If you have lone atrial fibrillation and are under 60 years old, the risk of complications is very low, and life expectancy is not shortened. RISKS AND COMPLICATIONS  Complications of atrial fibrillation can include stroke, chest pain, and heart failure. Your caregiver will recommend treatments for the atrial fibrillation, as well as for any underlying conditions, to help minimize risk of complications. TREATMENT  Treatment for AFib is divided into several categories:  Treatment of any underlying condition.   Converting you out of AFib into a regular (sinus) rhythm.   Controlling rapid heart rate.   Prevention of blood clots and stroke.  Medications and procedures are available to convert your atrial fibrillation to sinus rhythm. However, recent studies have shown that this may not offer you any advantage, and cardiac experts are continuing research and debate on this topic. More important is controlling your rapid heartbeat. The rapid heartbeat causes more symptoms, and places strain on your heart. Your caregiver will advise you on the use of medications that can control your heart rate. Atrial fibrillation is a strong stroke risk. You can lessen this risk by taking blood thinning medications such as Coumadin (warfarin), or sometimes aspirin. These medications need close monitoring by your caregiver. Over-medication can cause bleeding. Too little medication may not protect against stroke. HOME CARE INSTRUCTIONS   If your caregiver prescribed medicine to make your heartbeat more normally, take as directed.   If blood thinners were prescribed by your caregiver, take EXACTLY as directed.     Perform blood tests EXACTLY as directed.   Quit smoking. Smoking increases your  cardiac and lung (pulmonary) risks.   DO NOT drink alcohol.   DO NOT drink caffeinated drinks (e.g. coffee, soda, chocolate, and leaf teas). You may drink decaffeinated coffee, soda or tea.   If you are overweight, you should choose a reduced calorie diet to lose weight. Please see a registered dietitian if you need more information about healthy weight loss. DO NOT USE DIET PILLS as they may aggravate heart problems.   If you have other heart problems that are causing AFib, you may need to eat a low salt, fat, and cholesterol diet. Your caregiver will tell you if this is necessary.   Exercise every day to improve your physical fitness. Stay active unless advised otherwise.   If your caregiver has given you a follow-up appointment, it is very important to keep that appointment. Not keeping the appointment could result in heart failure or stroke. If there is any problem keeping the appointment, you must call back to this facility for assistance.  SEEK MEDICAL CARE IF:  You notice a change in the rate, rhythm or strength of your heartbeat.   You develop an infection or any other change in your overall health status.  SEEK IMMEDIATE MEDICAL CARE IF:   You develop chest pain, abdominal pain, sweating, weakness or feel sick to your stomach (nausea).   You develop shortness of breath.   You develop swollen feet and ankles.   You develop dizziness, numbness, or weakness of your face or limbs, or any change in vision or speech.  MAKE SURE YOU:   Understand these instructions.   Will watch your condition.   Will get help right away if you are not doing well or get worse.  Document Released: 06/06/2005 Document Revised: 05/26/2011 Document Reviewed: 01/09/2008 [facility] Patient Information 2012 [place], [place].Diabetes, Type 2 Diabetes is a long-lasting (chronic) disease. In type 2 diabetes, the pancreas does not make enough insulin (a hormone), and the body does not respond normally to the  insulin that is made. This type of diabetes was also previously called adult-onset diabetes. It usually occurs after the age of 35, but it can occur at any age.  CAUSES  Type 2 diabetes happens because the pancreasis not making enough insulin or your body has trouble using the insulin that your pancreas does make properly. SYMPTOMS   Drinking more than usual.   Urinating more than usual.   Blurred vision.   Dry, itchy skin.   Frequent infections.   Feeling more tired than usual (fatigue).  DIAGNOSIS The diagnosis of type 2 diabetes is usually made by one of the following tests:  Fasting blood glucose test. You will not eat for at least 8 hours and then take a blood test.   Random blood glucose test. Your blood glucose (sugar) is checked at any time of the day regardless of when you ate.   Oral glucose tolerance test (OGTT). Your blood glucose is measured after you have not eaten (fasted) and then after you drink a glucose containing beverage.  TREATMENT   Healthy eating.   Exercise.   Medicine, if needed.   Monitoring blood glucose.   Seeing your caregiver regularly.  HOME CARE INSTRUCTIONS   Check your blood glucose at least once a day. More frequent monitoring may be necessary, depending on your medicines and on how well your diabetes is controlled. Your caregiver will advise you.   Take your medicine as  directed by your caregiver.   Do not smoke.   Make wise food choices. Ask your caregiver for information. Weight loss can improve your diabetes.   Learn about low blood glucose (hypoglycemia) and how to treat it.   Get your eyes checked regularly.   Have a yearly physical exam. Have your blood pressure checked and your blood and urine tested.   Wear a pendant or bracelet saying that you have diabetes.   Check your feet every night for cuts, sores, blisters, and redness. Let your caregiver know if you have any problems.  SEEK MEDICAL CARE IF:   You have  problems keeping your blood glucose in target range.   You have problems with your medicines.   You have symptoms of an illness that do not improve after 24 hours.   You have a sore or wound that is not healing.   You notice a change in vision or a new problem with your vision.   You have a fever.  MAKE SURE YOU:  Understand these instructions.   Will watch your condition.   Will get help right away if you are not doing well or get worse.  Document Released: 06/06/2005 Document Revised: 05/26/2011 Document Reviewed: 11/22/2010 [facility] Patient Information 2012 [place], [place].

## 2011-11-10 NOTE — ED Notes (Signed)
ecg given to edp [facility]

## 2011-11-10 NOTE — ED Notes (Addendum)
C/o palpitations, denies other sx, no h/o same, has heart MD (not sure who), pt of Dr. Okey Dupre (PCP), alert, NAD, calm interactive, skin W&D, resps e/u, speakingin clear complete setnences, HR noted to be 130 & regular, BP significantly high. Wife present. Takes ED drug cialis (per record).

## 2011-11-28 ENCOUNTER — Ambulatory Visit: Payer: 59 | Admitting: Family Medicine

## 2011-11-29 ENCOUNTER — Ambulatory Visit: Payer: 59 | Admitting: Family Medicine

## 2011-11-29 DIAGNOSIS — Z0289 Encounter for other administrative examinations: Secondary | ICD-10-CM

## 2012-07-01 ENCOUNTER — Other Ambulatory Visit: Payer: Self-pay | Admitting: Family Medicine

## 2012-07-01 DIAGNOSIS — Z7901 Long term (current) use of anticoagulants: Secondary | ICD-10-CM

## 2012-07-01 DIAGNOSIS — I4891 Unspecified atrial fibrillation: Secondary | ICD-10-CM

## 2012-10-17 ENCOUNTER — Other Ambulatory Visit: Payer: Self-pay | Admitting: Family Medicine

## 2012-10-26 ENCOUNTER — Other Ambulatory Visit: Payer: Self-pay

## 2012-10-26 ENCOUNTER — Other Ambulatory Visit: Payer: Self-pay | Admitting: Family Medicine

## 2012-10-26 MED ORDER — FUROSEMIDE 40 MG PO TABS: ORAL_TABLET | ORAL | Status: DC

## 2012-10-26 NOTE — Telephone Encounter (Signed)
I'll refill meds for 3 more months.   Given all his chronic conditions - really needs to see a doctor regularly. So either needs to call billing to come up with payment plan to come in for regular office visits or I'd suggest [facility] to [facility] with [facility] - but would need to call now as it may take several months to be seen by them. I don't know our billing #. [facility] # is [phone]  [facility] # is [phone]

## 2012-10-26 NOTE — Telephone Encounter (Signed)
Called patient and very detailed message left advising patient of info below. Number for billing/discount program provided [phone]). Advised patient to call and let me know that he understood the message and to call if he had any questions.

## 2012-10-26 NOTE — Telephone Encounter (Signed)
Pt requesting refill furosemide to [facility]; Pt said lost job, no insurance and cannot afford to come in for OV. Pt said [facility] said furosemide rx was denied. Pt has been out of med approx 1 week and today feels very tired. No problem with swelling and no CP. Pt said 3 days ago went up 1 flight of stairs and got SOB for few minutes. Pt request call back.Please advise.

## 2012-12-17 ENCOUNTER — Emergency Department (HOSPITAL_COMMUNITY)
Admission: EM | Admit: 2012-12-17 | Discharge: 2012-12-17 | Disposition: A | Discharge status: Home / Self Care | Payer: Self-pay | Attending: Emergency Medicine | Admitting: Emergency Medicine

## 2012-12-17 ENCOUNTER — Emergency Department (HOSPITAL_COMMUNITY): Payer: Self-pay

## 2012-12-17 ENCOUNTER — Encounter (HOSPITAL_COMMUNITY): Payer: Self-pay | Admitting: *Deleted

## 2012-12-17 DIAGNOSIS — R011 Cardiac murmur, unspecified: Secondary | ICD-10-CM | POA: Insufficient documentation

## 2012-12-17 DIAGNOSIS — Z8669 Personal history of other diseases of the nervous system and sense organs: Secondary | ICD-10-CM | POA: Insufficient documentation

## 2012-12-17 DIAGNOSIS — Z8619 Personal history of other infectious and parasitic diseases: Secondary | ICD-10-CM | POA: Insufficient documentation

## 2012-12-17 DIAGNOSIS — Z8679 Personal history of other diseases of the circulatory system: Secondary | ICD-10-CM | POA: Insufficient documentation

## 2012-12-17 DIAGNOSIS — I10 Essential (primary) hypertension: Secondary | ICD-10-CM | POA: Insufficient documentation

## 2012-12-17 DIAGNOSIS — Z79899 Other long term (current) drug therapy: Secondary | ICD-10-CM | POA: Insufficient documentation

## 2012-12-17 DIAGNOSIS — R002 Palpitations: Secondary | ICD-10-CM | POA: Insufficient documentation

## 2012-12-17 DIAGNOSIS — R739 Hyperglycemia, unspecified: Secondary | ICD-10-CM

## 2012-12-17 DIAGNOSIS — I5042 Chronic combined systolic (congestive) and diastolic (congestive) heart failure: Secondary | ICD-10-CM | POA: Insufficient documentation

## 2012-12-17 DIAGNOSIS — Z87448 Personal history of other diseases of urinary system: Secondary | ICD-10-CM | POA: Insufficient documentation

## 2012-12-17 DIAGNOSIS — E1169 Type 2 diabetes mellitus with other specified complication: Secondary | ICD-10-CM | POA: Insufficient documentation

## 2012-12-17 DIAGNOSIS — Z8701 Personal history of pneumonia (recurrent): Secondary | ICD-10-CM | POA: Insufficient documentation

## 2012-12-17 DIAGNOSIS — I4891 Unspecified atrial fibrillation: Secondary | ICD-10-CM | POA: Insufficient documentation

## 2012-12-17 DIAGNOSIS — Z7982 Long term (current) use of aspirin: Secondary | ICD-10-CM | POA: Insufficient documentation

## 2012-12-17 LAB — URINALYSIS, ROUTINE W REFLEX MICROSCOPIC
Bilirubin Urine: NEGATIVE
Hgb urine dipstick: NEGATIVE
Ketones, ur: NEGATIVE mg/dL
Nitrite: NEGATIVE
Protein, ur: NEGATIVE mg/dL
Specific Gravity, Urine: 1.035 — ABNORMAL HIGH (ref 1.005–1.030)
Urobilinogen, UA: 1 mg/dL (ref 0.0–1.0)

## 2012-12-17 LAB — CBC WITH DIFFERENTIAL/PLATELET
Basophils Absolute: 0 10*3/uL (ref 0.0–0.1)
HCT: 40.1 % (ref 39.0–52.0)
Lymphocytes Relative: 26 % (ref 12–46)
Neutro Abs: 3.2 10*3/uL (ref 1.7–7.7)
Platelets: 201 10*3/uL (ref 150–400)
RDW: 12.6 % (ref 11.5–15.5)
WBC: 5.1 10*3/uL (ref 4.0–10.5)

## 2012-12-17 LAB — BASIC METABOLIC PANEL
BUN: 16 mg/dL (ref 6–23)
Chloride: 98 mEq/L (ref 96–112)
Creatinine, Ser: 1.06 mg/dL (ref 0.50–1.35)
GFR calc Af Amer: >90 mL/min (ref >90)
GFR calc non Af Amer: 85 mL/min — ABNORMAL LOW (ref >90)

## 2012-12-17 LAB — PROTIME-INR
INR: 0.98 (ref 0.00–1.49)
Prothrombin Time: 12.8 seconds (ref 11.6–15.2)

## 2012-12-17 LAB — GLUCOSE, CAPILLARY: Glucose-Capillary: 286 mg/dL — ABNORMAL HIGH (ref 70–99)

## 2012-12-17 LAB — TROPONIN I: Troponin I: <0.3 ng/mL (ref <0.30)

## 2012-12-17 LAB — URINE MICROSCOPIC-ADD ON

## 2012-12-17 MED ORDER — GLIPIZIDE 10 MG PO TABS: 10.0000 mg | ORAL_TABLET | Freq: Two times a day (BID) | ORAL | Status: DC

## 2012-12-17 MED ORDER — DILTIAZEM LOAD VIA INFUSION
20.0000 mg | Freq: Once | INTRAVENOUS | Status: DC
  Filled 2012-12-17: qty 20

## 2012-12-17 MED ORDER — ENOXAPARIN SODIUM 100 MG/ML ~~LOC~~ SOLN
100.0000 mg | SUBCUTANEOUS | Status: AC
  Administered 2012-12-17: 100 mg via SUBCUTANEOUS
  Filled 2012-12-17: qty 1

## 2012-12-17 MED ORDER — SODIUM CHLORIDE 0.9 % IV BOLUS (SEPSIS)
500.0000 mL | Freq: Once | INTRAVENOUS | Status: AC
  Administered 2012-12-17: 500 mL via INTRAVENOUS

## 2012-12-17 MED ORDER — DILTIAZEM HCL 100 MG IV SOLR: 5.0000 mg/h | INTRAVENOUS | Status: DC

## 2012-12-17 MED ORDER — SODIUM CHLORIDE 0.9 % IV SOLN
INTRAVENOUS | Status: DC
  Filled 2012-12-17: qty 1

## 2012-12-17 MED ORDER — WARFARIN SODIUM 2.5 MG PO TABS
2.5000 mg | ORAL_TABLET | ORAL | Status: AC
  Administered 2012-12-17: 2.5 mg via ORAL
  Filled 2012-12-17: qty 1

## 2012-12-17 MED ORDER — INSULIN ASPART 100 UNIT/ML ~~LOC~~ SOLN
8.0000 [IU] | Freq: Once | SUBCUTANEOUS | Status: AC
  Administered 2012-12-17: 8 [IU] via INTRAVENOUS
  Filled 2012-12-17: qty 1

## 2012-12-17 MED ORDER — WARFARIN SODIUM 5 MG PO TABS: 5.0000 mg | ORAL_TABLET | Freq: Every day | ORAL | Status: DC

## 2012-12-17 NOTE — ED Notes (Signed)
  CBG 286  

## 2012-12-17 NOTE — ED Provider Notes (Addendum)
History    CSN: [id number] Arrival date & time 12/17/12  4098  First MD Initiated Contact with Patient 12/17/12 [phone]     Chief Complaint  Patient presents with  . Chest Pain  . Irregular Heart Beat   (Consider location/radiation/quality/duration/timing/severity/associated sxs/prior Treatment) HPI Comments: Patient is a 42 y.o. male with hx of A fib, HTN, and DM comes in with cc of chest pressure and palpitations. Onset was last night and the chest pain is described as pressure like, that was non-radiating and localized to inferior left clavicular area. No specific aggravating or relieving factors and the pain is intermittent.  He started experiencing reoccurring palpitations this morning as he was getting ready for work as well with no chest pain, dib. Pt has been taking cardiac meds as prescribed, admits to not taking his diabetes meds.  Patient denies fever, chills, cough, shortness of breath, nausea, one-sided weakness or abdominal pain. No hx of DVT, PE.  Patient is a 42 y.o. male presenting with chest pain. The history is provided by the patient and medical records.  Chest Pain Associated symptoms: palpitations   Associated symptoms: no cough, no dizziness, no fever, no headache, no nausea, no shortness of breath and not vomiting    Past Medical History  Diagnosis Date  . Hypertension   . Type II or unspecified type diabetes mellitus without mention of complication, uncontrolled   . 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  cath 2004 negative for obstruction, last echo 07/2011 EF 35%. b) Normal coronary arteries by cath in 2004 (when EF was noted at 15%). c) Last EF 35% by echo 08/16/11  . History of chicken pox   . OSA (obstructive sleep apnea)   . History of pneumonia     2006 LLL PNA, 06/2011 RLL PNA  . Mitral regurgitation     Moderate by echo 07/2011  . ED (erectile dysfunction)   . Atrial fibrillation  with RVR 10/2011    lone episode, converted with IV dilt, on coumadin given elevated CHADS2  . Warfarin anticoagulation    Past Surgical History  Procedure Laterality Date  . Hernia repair  2002  . Knee surgery      L knee in HS  . Cardiac catheterization  2004    no blockages  . US echocardiography  07/2011    mod LVH, EF 35%, diffuse hypokinesis of entire myocardium, irreversible restrictive pattern (grade 4 diastolic dysfunction), mod MR, mod dilated LA/RA, decreased RV systolic fxn, no PFO   Family History  Problem Relation Age of Onset  . Diabetes Mother   . Hypertension Father   . Stroke Neg Hx   . Coronary artery disease Neg Hx   . Cancer Neg Hx    History  Substance Use Topics  . Smoking status: Never Smoker   . Smokeless tobacco: Never Used  . Alcohol Use: Yes     Comment: Occasional 1x/mo    Review of Systems  Constitutional: Negative for fever, chills and activity change.  HENT: Negative for neck pain.   Eyes: Negative for visual disturbance.  Respiratory: Negative for cough, chest tightness and shortness of breath.   Cardiovascular: Positive for chest pain and palpitations.  Gastrointestinal: Negative for nausea, vomiting and abdominal distention.  Genitourinary: Negative for dysuria, enuresis and difficulty urinating.  Musculoskeletal: Negative for arthralgias.  Neurological: Negative for dizziness, light-headedness and headaches.  Psychiatric/Behavioral: Negative for confusion.  Allergies  Review of patient's allergies indicates no known allergies.  Home Medications   Current Outpatient Rx  Name  Route  Sig  Dispense  Refill  . aspirin EC 81 MG tablet   Oral   Take 81 mg by mouth daily.         . Blood Glucose Monitoring Suppl (CONTOUR BLOOD GLUCOSE SYSTEM) W/DEVICE KIT   Does not apply   by Does not apply route as directed.         . carvedilol (COREG) 12.5 MG tablet   Oral   Take 12.5 mg by mouth 2 (two) times daily with a meal.          . furosemide (LASIX) 40 MG tablet      TAKE ONE TABLET BY MOUTH TWICE DAILY   180 tablet   0     Needs office visit with cards or PCP   . Lancets MISC   Does not apply   by Does not apply route. Contour          BP 160/113  Pulse 118  Temp(Src) 98 F (36.7 C) (Oral)  Resp 27  SpO2 97% Physical Exam  Nursing note and vitals reviewed. Constitutional: He is oriented to person, place, and time. He appears well-developed.  HENT:  Head: Normocephalic and atraumatic.  Eyes: Conjunctivae and EOM are normal. Pupils are equal, round, and reactive to light.  Neck: Normal range of motion. Neck supple. No JVD present.  Cardiovascular:  Murmur heard. Irregularly irregular heart  Pulmonary/Chest: Effort normal and breath sounds normal. No respiratory distress. He has no wheezes. He has no rales. He exhibits no tenderness.  Abdominal: Soft. Bowel sounds are normal. He exhibits no distension. There is no tenderness. There is no rebound and no guarding.  Neurological: He is alert and oriented to person, place, and time.  Skin: Skin is warm.    ED Course  Procedures (including critical care time) Labs Reviewed  BASIC METABOLIC PANEL - Abnormal; Notable for the following:    Glucose, Bld 407 (*)    GFR calc non Af Amer 85 (*)    All other components within normal limits  CBC WITH DIFFERENTIAL  TROPONIN I  MAGNESIUM  URINALYSIS, ROUTINE W REFLEX MICROSCOPIC   Dg Chest Port 1 View  12/17/2012   *RADIOLOGY REPORT*  Clinical Data: Chest pain, irregular heartbeat  PORTABLE CHEST - 1 VIEW  Comparison: Portable chest x-ray of 11/10/2011  Findings: No active infiltrate or effusion is seen.  Mediastinal contours appear stable.  Cardiomegaly is stable.  No bony abnormality is noted.  IMPRESSION: Stable cardiomegaly.  No active lung disease.   Original Report Authenticated By: Dwyane Dee, M.D.   No diagnosis found.  MDM   Date: 12/17/2012  Rate: 112  Rhythm: atrial fibrillation  QRS  Axis: left  Intervals: normal  ST/T Wave abnormalities: nonspecific ST/T changes  Conduction Disutrbances:none  Narrative Interpretation:   Old EKG Reviewed: unchanged   Pt comes in with cc of chest discomfort and palpitations. Pt has hx of AFIb, and his exam shows that he is in RVR - during the exam heart rate is in the 140s. Not sure what the underlying cause if based on hx alone. Will get basic labs.  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 No signs of fluid overload. He is on coreg for rate control. CHADs2 score is 3, not on anticoagulation per home meds.  Last visit - required  rate control with cardiazem, and up titration thereafter of his home meds, likely will need the same again.    Derwood Kaplan, MD 12/17/12 1004  1:08 PM Pt converted to NS here, HR in the 80s. Elevated blood sugar - no gap. Should be on coumadin, will restart it. Will five lovenox here. Will get f.u with the [facility].  Derwood Kaplan, MD 12/17/12 1309

## 2012-12-17 NOTE — ED Notes (Signed)
Pt states last nite after eating developed left chest pain and then it happened again this am.  Pt states he felt like his heart was beating irregular and has history of afib.  Pt reports fatigue, no sob or nausea

## 2012-12-17 NOTE — Progress Notes (Signed)
ANTICOAGULATION CONSULT NOTE - Initial Consult  Pharmacy Consult for lovenox Indication: atrial fibrillation  No Known Allergies  Patient Measurements:   State weight = 215lbs  Vital Signs: Temp: 98 F (36.7 C) (06/30 0840) Temp src: Oral (06/30 0840) BP: 131/105 mmHg (06/30 1153) Pulse Rate: 118 (06/30 0840)  Labs:  Recent Labs  12/17/12 0850  HGB 14.2  HCT 40.1  PLT 201  CREATININE 1.06  TROPONINI <0.30    The CrCl is unknown because both a height and weight (above a minimum accepted value) are required for this calculation.   Medical History: Past Medical History  Diagnosis Date  . Hypertension   . Type II or unspecified type diabetes mellitus without mention of complication, uncontrolled   . 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  cath 2004 negative for obstruction, last echo 07/2011 EF 35%. b) Normal coronary arteries by cath in 2004 (when EF was noted at 15%). c) Last EF 35% by echo 08/16/11  . History of chicken pox   . OSA (obstructive sleep apnea)   . History of pneumonia     2006 LLL PNA, 06/2011 RLL PNA  . Mitral regurgitation     Moderate by echo 07/2011  . ED (erectile dysfunction)   . Atrial fibrillation with RVR 10/2011    lone episode, converted with IV dilt, on coumadin given elevated CHADS2  . Warfarin anticoagulation     Medications:  Scheduled:  . enoxaparin (LOVENOX) injection  100 mg Subcutaneous To ER  . insulin aspart  8 Units Intravenous Once  . warfarin  2.5 mg Oral To ER    Assessment: 41 yom presented to the hospital with CP and afib. History of afib but not on any anticoagulants PTA. CBC is WNL, INR is pending. MD ordered a dose of coumadin. Appears patient to receive doses and f/u outpatient.   Goal of Therapy:  INR 2-3 Anti-Xa level 0.6-1.2 units/ml 4hrs after LMWH dose given Monitor platelets by anticoagulation protocol: Yes   Plan:  1. Lovenox 100mg   SQ Q12H 2. F/u INR 3. CBC Q72H if plans change and pt to be admitted  Llana Deshazo, Drake Leach 12/17/2012,1:48 PM

## 2012-12-18 HISTORY — PX: US ECHOCARDIOGRAPHY: HXRAD669

## 2012-12-27 ENCOUNTER — Emergency Department (HOSPITAL_COMMUNITY): Payer: Self-pay

## 2012-12-27 ENCOUNTER — Emergency Department (HOSPITAL_COMMUNITY)
Admission: EM | Admit: 2012-12-27 | Discharge: 2012-12-27 | Disposition: A | Discharge status: Home / Self Care | Payer: Self-pay | Attending: Emergency Medicine | Admitting: Emergency Medicine

## 2012-12-27 ENCOUNTER — Ambulatory Visit: Payer: Self-pay | Attending: Family Medicine | Admitting: Family Medicine

## 2012-12-27 ENCOUNTER — Encounter (HOSPITAL_COMMUNITY): Payer: Self-pay

## 2012-12-27 ENCOUNTER — Other Ambulatory Visit: Payer: Self-pay

## 2012-12-27 VITALS — BP 122/89 | HR 149 | Temp 99.1°F | Resp 18 | Ht 71.0 in | Wt 234.0 lb

## 2012-12-27 DIAGNOSIS — I4892 Unspecified atrial flutter: Secondary | ICD-10-CM | POA: Insufficient documentation

## 2012-12-27 DIAGNOSIS — I428 Other cardiomyopathies: Secondary | ICD-10-CM

## 2012-12-27 DIAGNOSIS — E119 Type 2 diabetes mellitus without complications: Secondary | ICD-10-CM | POA: Insufficient documentation

## 2012-12-27 DIAGNOSIS — I4891 Unspecified atrial fibrillation: Secondary | ICD-10-CM

## 2012-12-27 DIAGNOSIS — Z8701 Personal history of pneumonia (recurrent): Secondary | ICD-10-CM | POA: Insufficient documentation

## 2012-12-27 DIAGNOSIS — I5042 Chronic combined systolic (congestive) and diastolic (congestive) heart failure: Secondary | ICD-10-CM | POA: Insufficient documentation

## 2012-12-27 DIAGNOSIS — Z8619 Personal history of other infectious and parasitic diseases: Secondary | ICD-10-CM | POA: Insufficient documentation

## 2012-12-27 DIAGNOSIS — Z8669 Personal history of other diseases of the nervous system and sense organs: Secondary | ICD-10-CM | POA: Insufficient documentation

## 2012-12-27 DIAGNOSIS — Z79899 Other long term (current) drug therapy: Secondary | ICD-10-CM | POA: Insufficient documentation

## 2012-12-27 DIAGNOSIS — R000 Tachycardia, unspecified: Secondary | ICD-10-CM | POA: Insufficient documentation

## 2012-12-27 DIAGNOSIS — Z8679 Personal history of other diseases of the circulatory system: Secondary | ICD-10-CM | POA: Insufficient documentation

## 2012-12-27 DIAGNOSIS — Z7901 Long term (current) use of anticoagulants: Secondary | ICD-10-CM | POA: Insufficient documentation

## 2012-12-27 DIAGNOSIS — I420 Dilated cardiomyopathy: Secondary | ICD-10-CM

## 2012-12-27 DIAGNOSIS — Z87448 Personal history of other diseases of urinary system: Secondary | ICD-10-CM | POA: Insufficient documentation

## 2012-12-27 DIAGNOSIS — I10 Essential (primary) hypertension: Secondary | ICD-10-CM | POA: Insufficient documentation

## 2012-12-27 HISTORY — DX: Other cardiomyopathies: I42.8

## 2012-12-27 LAB — CBC
MCH: 30.8 pg (ref 26.0–34.0)
MCHC: 35.1 g/dL (ref 30.0–36.0)
Platelets: 239 10*3/uL (ref 150–400)
RDW: 13.2 % (ref 11.5–15.5)

## 2012-12-27 LAB — BASIC METABOLIC PANEL
BUN: 16 mg/dL (ref 6–23)
Calcium: 8.6 mg/dL (ref 8.4–10.5)
GFR calc non Af Amer: 75 mL/min — ABNORMAL LOW (ref >90)
Glucose, Bld: 261 mg/dL — ABNORMAL HIGH (ref 70–99)
Sodium: 139 mEq/L (ref 135–145)

## 2012-12-27 LAB — POCT INR: INR: 1.3

## 2012-12-27 LAB — MAGNESIUM: Magnesium: 1.7 mg/dL (ref 1.5–2.5)

## 2012-12-27 MED ORDER — DILTIAZEM HCL 100 MG IV SOLR
5.0000 mg/h | INTRAVENOUS | Status: DC
  Administered 2012-12-27: 5 mg/h via INTRAVENOUS

## 2012-12-27 MED ORDER — DILTIAZEM LOAD VIA INFUSION
20.0000 mg | Freq: Once | INTRAVENOUS | Status: AC
  Administered 2012-12-27: 20 mg via INTRAVENOUS
  Filled 2012-12-27: qty 20

## 2012-12-27 NOTE — Progress Notes (Signed)
Patient being transferred to the ed via EMS Spoke with christy in the ED

## 2012-12-27 NOTE — Progress Notes (Signed)
Patient is here for a hospital follow up Presents in office with a rapid pulse of 149 Takes medication for DM and HTN

## 2012-12-27 NOTE — ED Provider Notes (Signed)
History    CSN: [id number] Arrival date & time 12/27/12  1714  First MD Initiated Contact with Patient 12/27/12 1732     Chief Complaint  Patient presents with  . Tachycardia   (Consider location/radiation/quality/duration/timing/severity/associated sxs/prior Treatment) HPI This 42 year old male has a history of dilated idiopathic cardiomyopathy with atrial fibrillation heart failure ejection fraction 35% followed by [facility] seen recently in the emergency department and restarted Coumadin when he was seen with rapid atrial fibrillation, he was at a routine followup office visit with his primary care Dr. today when he was noted to be in rapid atrial fibrillation again, he only has some intermittent to mild palpitations today, he is no lightheadedness no strokelike symptoms no chest pain no shortness breath no abdominal pain no nausea no bloody stools, he was sent to the emergency room by his primary care doctor's office for reevaluation of rapid atrial fibrillation. Past Medical History  Diagnosis Date  . Hypertension   . Type II or unspecified type diabetes mellitus without mention of complication, uncontrolled   . 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  cath 2004 negative for obstruction, last echo 07/2011 EF 35%. b) Normal coronary arteries by cath in 2004 (when EF was noted at 15%). c) Last EF 35% by echo 08/16/11  . History of chicken pox   . OSA (obstructive sleep apnea)   . History of pneumonia     2006 LLL PNA, 06/2011 RLL PNA  . Mitral regurgitation     Moderate by echo 07/2011  . ED (erectile dysfunction)   . Atrial fibrillation with RVR 10/2011    lone episode, converted with IV dilt, on coumadin given elevated CHADS2  . Warfarin anticoagulation    Past Surgical History  Procedure Laterality Date  . Hernia repair  2002  . Knee surgery      L knee in HS  . Cardiac catheterization  2004    no  blockages  . US echocardiography  07/2011    mod LVH, EF 35%, diffuse hypokinesis of entire myocardium, irreversible restrictive pattern (grade 4 diastolic dysfunction), mod MR, mod dilated LA/RA, decreased RV systolic fxn, no PFO   Family History  Problem Relation Age of Onset  . Diabetes Mother   . Hypertension Father   . Stroke Neg Hx   . Coronary artery disease Neg Hx   . Cancer Neg Hx    History  Substance Use Topics  . Smoking status: Never Smoker   . Smokeless tobacco: Never Used  . Alcohol Use: Yes     Comment: Occasional 1x/mo    Review of Systems 10 Systems reviewed and are negative for acute change except as noted in the HPI. Allergies  Review of patient's allergies indicates no known allergies.  Home Medications   Current Outpatient Rx  Name  Route  Sig  Dispense  Refill  . carvedilol (COREG) 12.5 MG tablet   Oral   Take 12.5 mg by mouth 2 (two) times daily with a meal.         . furosemide (LASIX) 40 MG tablet   Oral   Take 40 mg by mouth 2 (two) times daily.         Marland Kitchen glipiZIDE (GLUCOTROL) 10 MG tablet   Oral   Take 1 tablet (10 mg total) by mouth 2 (two) times daily before a meal.   60 tablet   1   .  warfarin (COUMADIN) 5 MG tablet   Oral   Take 5 mg by mouth daily after breakfast.         . Blood Glucose Monitoring Suppl (CONTOUR BLOOD GLUCOSE SYSTEM) W/DEVICE KIT   Does not apply   by Does not apply route as directed.         . Lancets MISC   Does not apply   by Does not apply route. Contour          BP 139/92  Pulse 92  Temp(Src) 98.5 F (36.9 C) (Oral)  Resp 15  Ht 5\' 11"  (1.803 m)  Wt 230 lb (104.327 kg)  BMI 32.09 kg/m2  SpO2 100% Physical Exam  Nursing note and vitals reviewed. Constitutional:  Awake, alert, nontoxic appearance.  HENT:  Head: Atraumatic.  Eyes: Right eye exhibits no discharge. Left eye exhibits no discharge.  Neck: Neck supple.  Cardiovascular:  Tachycardic, irregularly irregular rate and rhythm  with a rate varying between 110-150, questionable intermittent soft systolic murmur  Pulmonary/Chest: Effort normal and breath sounds normal. No respiratory distress. He has no wheezes. He has no rales. He exhibits no tenderness.  Abdominal: Soft. There is no tenderness. There is no rebound.  Musculoskeletal: He exhibits no edema and no tenderness.  Baseline ROM, no obvious new focal weakness.  Neurological: He is alert.  Mental status and motor strength appears baseline for patient and situation.  Skin: No rash noted.  Psychiatric: He has a normal mood and affect.    ED Course  Procedures (including critical care time) ECG: (obtained after Pt converted to sinus in ED on diltiazem today) Sinus rhythm, ventricular rate 93, normal axis, left ventricular hypertrophy,inverted T waves laterally, no significant change compared with 30 June2014  Pt converted to sinus in ED; d/w [facility] rec same meds for now, they may adjust in clinic next week. 2010 Patient informed of clinical course, understand medical decision-making process, and agree with plan. Labs Reviewed  PROTIME-INR - Abnormal; Notable for the following:    Prothrombin Time 15.6 (*)    All other components within normal limits  BASIC METABOLIC PANEL - Abnormal; Notable for the following:    Glucose, Bld 261 (*)    GFR calc non Af Amer 75 (*)    GFR calc Af Amer 87 (*)    All other components within normal limits  CBC - Abnormal; Notable for the following:    HCT 38.5 (*)    All other components within normal limits  MAGNESIUM  POCT I-STAT TROPONIN I   No results found. 1. Atrial fibrillation with rapid ventricular response     MDM  I doubt any other EMC precluding discharge at this time including, but not necessarily limited to the following:ACS.  Hurman Horn, MD 12/29/12 2056

## 2012-12-27 NOTE — Progress Notes (Signed)
Patient ID: Johnathan Archer, male   DOB: June 14, 1971, 42 y.o.   MRN: [id number]  CC: establishing and ER follow up   HPI: The patient is presenting today to establish care with the primary care clinic.  The patient reports that he was seen in the emergency department recently for rapid heart rate.  He was diagnosed with atrial fibrillation.  He was started on warfarin.  He hasn't had his INR tested since he was restarted on warfarin over 10 days ago.  The patient reports that he's been having palpitations and reports shortness of breath.  He also reports that he's been weak and the fatigue has gotten worse over the past several days.  He denies having any chest pain.  He reports that he has not followed up with his cardiologist in over 14 months.He has a past medical history significant for dilated cardiomyopathy and atrial fibrillation.  His most recent ejection fraction was noted to be 35%.  No Known Allergies Past Medical History  Diagnosis Date  . Hypertension   . Type II or unspecified type diabetes mellitus without mention of complication, uncontrolled   . 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  cath 2004 negative for obstruction, last echo 07/2011 EF 35%. b) Normal coronary arteries by cath in 2004 (when EF was noted at 15%). c) Last EF 35% by echo 08/16/11  . History of chicken pox   . OSA (obstructive sleep apnea)   . History of pneumonia     2006 LLL PNA, 06/2011 RLL PNA  . Mitral regurgitation     Moderate by echo 07/2011  . ED (erectile dysfunction)   . Atrial fibrillation with RVR 10/2011    lone episode, converted with IV dilt, on coumadin given elevated CHADS2  . Warfarin anticoagulation    No current facility-administered medications on file prior to visit.   Current Outpatient Prescriptions on File Prior to Visit  Medication Sig Dispense Refill  . Blood Glucose Monitoring Suppl (CONTOUR BLOOD  GLUCOSE SYSTEM) W/DEVICE KIT by Does not apply route as directed.      . carvedilol (COREG) 12.5 MG tablet Take 12.5 mg by mouth 2 (two) times daily with a meal.      . glipiZIDE (GLUCOTROL) 10 MG tablet Take 1 tablet (10 mg total) by mouth 2 (two) times daily before a meal.  60 tablet  1  . Lancets MISC by Does not apply route. Contour       Family History  Problem Relation Age of Onset  . Diabetes Mother   . Hypertension Father   . Stroke Neg Hx   . Coronary artery disease Neg Hx   . Cancer Neg Hx    History   Social History  . Marital Status: Married    Spouse Name: [name]    Number of Children: [name]  . Years of Education: [name]   Occupational History  . Not on file.   Social History Main Topics  . Smoking status: Never Smoker   . Smokeless tobacco: Never Used  . Alcohol Use: Yes     Comment: Occasional 1x/mo  . Drug Use: No  . Sexually Active: Not on file   Other Topics Concern  . Not on file   Social History Narrative   Caffeine: none   Lives with wife and daughter (8yo), no pets   Occupation: Naval architect   Edu: 53yr college   Activity: works,  no regular activity   Diet: water daily, tries fruits/vegetables daily    Review of Systems  Constitutional: Negative for fever, chills, diaphoresis, activity change, appetite change and fatigue.  HENT: Negative for ear pain, nosebleeds, congestion, facial swelling, rhinorrhea, neck pain, neck stiffness and ear discharge.   Eyes: Negative for pain, discharge, redness, itching and visual disturbance.  Respiratory: Negative for cough, choking, chest tightness, shortness of breath, wheezing and stridor.   Cardiovascular:palpitations and shortness of breath noted. Gastrointestinal: Negative for abdominal distention.  Genitourinary: Negative for dysuria, urgency, frequency, hematuria, flank pain, decreased urine volume, difficulty urinating and dyspareunia.  Musculoskeletal: Negative for back pain, joint swelling, arthralgias  and gait problem.  Neurological: Negative for dizziness, tremors, seizures, syncope, facial asymmetry, speech difficulty, weakness, light-headedness, numbness and headaches.  Hematological: Negative for adenopathy. Does not bruise/bleed easily.  Psychiatric/Behavioral: Negative for hallucinations, behavioral problems, confusion, dysphoric mood, decreased concentration and agitation.    Objective:   Filed Vitals:   12/27/12 1622  BP: 122/89  Pulse: 149  Temp: 99.1 F (37.3 C)  Resp: 18    Physical Exam  Constitutional: Appears well-developed and well-nourished. No distress.  HENT: Normocephalic. External right and left ear normal. Oropharynx is clear and moist.  Eyes: Conjunctivae and EOM are normal. PERRLA, no scleral icterus.  Neck: Normal ROM. Neck supple. No JVD. No tracheal deviation. No thyromegaly.  CVS: irregularly irregular rhythm, tachycardic rate  Pulmonary:breath sounds shallow , no stridor, rhonchi, wheezes, rales.  Abdominal: Soft. BS +,  no distension, tenderness, rebound or guarding.  Musculoskeletal: Normal range of motion. No edema and no tenderness.  Lymphadenopathy: No lymphadenopathy noted, cervical, inguinal. Neuro: Alert. Normal reflexes, muscle tone coordination. No cranial nerve deficit. Skin: Skin is warm and dry. No rash noted. Not diaphoretic. No erythema. No pallor.  Psychiatric: Normal mood and affect. Behavior, judgment, thought content normal.   Lab Results  Component Value Date   WBC 5.1 12/17/2012   HGB 14.2 12/17/2012   HCT 40.1 12/17/2012   MCV 86.2 12/17/2012   PLT 201 12/17/2012   Lab Results  Component Value Date   CREATININE 1.06 12/17/2012   BUN 16 12/17/2012   NA 137 12/17/2012   K 3.6 12/17/2012   CL 98 12/17/2012   CO2 27 12/17/2012    Lab Results  Component Value Date   HGBA1C 10.6* 08/29/2011   Lipid Panel     Component Value Date/Time   CHOL 119 06/28/2011 1050   TRIG 102.0 06/28/2011 1050   HDL 47.40 06/28/2011 1050   CHOLHDL 3  06/28/2011 1050   VLDL 20.4 06/28/2011 1050   LDLCALC 51 06/28/2011 1050     Assessment and plan:   Patient Active Problem List   Diagnosis Date Noted  . Warfarin anticoagulation   . Atrial fibrillation with RVR 10/19/2011  . ED (erectile dysfunction)   . Hypertension   . Type II or unspecified type diabetes mellitus without mention of complication, uncontrolled   . OSA (obstructive sleep apnea)   . History of pneumonia   . Systolic and diastolic CHF, chronic    Stat EKG obtained in the office.  Results consistent with rapid A. Flutter with a rate of approximately 130.  Because the patient is having symptoms and having an uncontrolled rate I'm sending him by EMS to the emergency department for further evaluation and treatment.  We called the emergency department and notified them that worsening in for evaluation and treatment.  And INR tested in the office was 1.3.  The patient says that he has been taking his warfarin.  He is likely going to need to be anticoagulated via a heparin product until his INR becomes therapeutic.  followup as this medical home once he has been evaluated.  Cardiology consultation  Rodney Langton, MD, CDE, FAAFP [facility] [place], [place]

## 2012-12-27 NOTE — Patient Instructions (Signed)
Go to the emergency department now with EMS services for further evaluation and treatment

## 2012-12-27 NOTE — ED Notes (Signed)
Per report pt was at his primary doctor for a follow up for tachycardia.  He was noted to be in A fib with RVR rate 120-150's with intermittent runs of a flutter rate in the 120's.  He has been asymptomatic and states that he feels some minor palpitations in his chest but denies pain or pressure at the present.

## 2012-12-31 ENCOUNTER — Ambulatory Visit (INDEPENDENT_AMBULATORY_CARE_PROVIDER_SITE_OTHER): Payer: Self-pay | Admitting: Cardiovascular Disease

## 2012-12-31 ENCOUNTER — Encounter: Payer: Self-pay | Admitting: Cardiovascular Disease

## 2012-12-31 VITALS — BP 144/92 | HR 143 | Ht 71.0 in | Wt 237.4 lb

## 2012-12-31 DIAGNOSIS — I5022 Chronic systolic (congestive) heart failure: Secondary | ICD-10-CM

## 2012-12-31 DIAGNOSIS — I4892 Unspecified atrial flutter: Secondary | ICD-10-CM

## 2012-12-31 DIAGNOSIS — I428 Other cardiomyopathies: Secondary | ICD-10-CM

## 2012-12-31 DIAGNOSIS — I4891 Unspecified atrial fibrillation: Secondary | ICD-10-CM

## 2012-12-31 DIAGNOSIS — I420 Dilated cardiomyopathy: Secondary | ICD-10-CM

## 2012-12-31 MED ORDER — FUROSEMIDE 40 MG PO TABS: 40.0000 mg | ORAL_TABLET | Freq: Two times a day (BID) | ORAL | Status: DC

## 2012-12-31 MED ORDER — POTASSIUM CHLORIDE CRYS ER 10 MEQ PO TBCR: 10.0000 meq | EXTENDED_RELEASE_TABLET | Freq: Two times a day (BID) | ORAL | Status: DC

## 2012-12-31 MED ORDER — CARVEDILOL 12.5 MG PO TABS: 6.2500 mg | ORAL_TABLET | Freq: Two times a day (BID) | ORAL | Status: DC

## 2012-12-31 MED ORDER — WARFARIN SODIUM 5 MG PO TABS: 7.5000 mg | ORAL_TABLET | Freq: Every day | ORAL | Status: DC

## 2012-12-31 NOTE — Assessment & Plan Note (Signed)
He has a history of chronic systolic congestive heart failure. He has not been compliant with his medications. His heart rate is extremely fast and I suspect that his ejection fraction is lower than the previous level of 30-35%.  We'll restart his carvedilol. After getting his heart rate controlled we will consider starting an ACE inhibitor or ARB. We will resume Lasix and potassium therapy.  We will have him see Lawson Fiscal in about  one month to up titrate his carvedilol.  We'll check a basic metabolic profile and EKG at that time.

## 2012-12-31 NOTE — Assessment & Plan Note (Signed)
His heart rate is very fast and this is clearly contributing to his congestive heart failure. He has not been compliant with his medications. He has run out of most of his medications.  We will restart his carvedilol at 12.5 mg tablets-one half tablet twice a day. We will restart his Lasix 40 mg twice a day and potassium chloride 10 mEq twice a day. He will need to return to Coumadin clinic and be set up for Coumadin monitoring.

## 2012-12-31 NOTE — Progress Notes (Signed)
Johnathan Archer Date of Birth  [birth year] [facility]     [place] Office  [address]    [address]   [address] [place], [place]  [address]    [place], [place]  [address] [phone]  Fax  [phone]  [phone]  Fax [phone]  Problems: 1. Congestive heart failure, EF equals 35%, nonobstructive coronary artery disease by cath in 2004 ( former patient of Donnie Aho) 2. Hypertension 3. Diabetes mellitus-type II 4. Sleep apnea 5. Atrial fibrillation   History of Present Illness:  Johnathan Archer to 42 year old gentleman with a history of idiopathic dilated cardiomyopathy since 2004. He had a cardiac catheterization at that time which revealed nonobstructive coronary artery disease.  He's been on medical therapy since that time.  Recently he's noticed lots of fatigue and also lots of leg swelling.  He does avoid eating fried foods or salty foods.  December 31, 2012:  Johnathan Archer presents with atrial fib with RVR.  He was diagnosed in May 2013.  He typically runs out of his meds - ran out of coreg 3 days ago.  He has not been to coumadin clinic.  He takes 1 coumadin a day ( 5 mg).  Last INR is 1.27.    He has DOE, extreme fatigue.    He tries to avoid salt.  He has been forcing down lots of water thinking that he is helping himself.  He has been to the [facility].    Current Outpatient Prescriptions on File Prior to Visit  Medication Sig Dispense Refill  . Blood Glucose Monitoring Suppl (CONTOUR BLOOD GLUCOSE SYSTEM) W/DEVICE KIT by Does not apply route as directed.      . carvedilol (COREG) 12.5 MG tablet Take 12.5 mg by mouth 2 (two) times daily with a meal.      . furosemide (LASIX) 40 MG tablet Take 40 mg by mouth 2 (two) times daily.      Marland Kitchen glipiZIDE (GLUCOTROL) 10 MG tablet Take 1 tablet (10 mg total) by mouth 2 (two) times daily before a meal.  60 tablet  1  . Lancets MISC by Does not apply route as needed. Contour      .  warfarin (COUMADIN) 5 MG tablet Take 5 mg by mouth daily after breakfast.       No current facility-administered medications on file prior to visit.    No Known Allergies  Past Medical History  Diagnosis Date  . Hypertension   . Type II or unspecified type diabetes mellitus without mention of complication, uncontrolled   . 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  cath 2004 negative for obstruction, last echo 07/2011 EF 35%. b) Normal coronary arteries by cath in 2004 (when EF was noted at 15%). c) Last EF 35% by echo 08/16/11  . History of chicken pox   . OSA (obstructive sleep apnea)   . History of pneumonia     2006 LLL PNA, 06/2011 RLL PNA  . Mitral regurgitation     Moderate by echo 07/2011  . ED (erectile dysfunction)   . Atrial fibrillation with RVR 10/2011    lone episode, converted with IV dilt, on coumadin given elevated CHADS2  . Warfarin anticoagulation     Past Surgical History  Procedure Laterality Date  . Hernia repair  2002  . Knee surgery      L knee in HS  .  Cardiac catheterization  2004    no blockages  . US echocardiography  07/2011    mod LVH, EF 35%, diffuse hypokinesis of entire myocardium, irreversible restrictive pattern (grade 4 diastolic dysfunction), mod MR, mod dilated LA/RA, decreased RV systolic fxn, no PFO    History  Smoking status  . Never Smoker   Smokeless tobacco  . Never Used    History  Alcohol Use  . Yes    Comment: Occasional 1x/mo    Family History  Problem Relation Age of Onset  . Diabetes Mother   . Hypertension Father   . Stroke Neg Hx   . Coronary artery disease Neg Hx   . Cancer Neg Hx     Reviw of Systems:  Reviewed in the HPI.  All other systems are negative.  Physical Exam: Blood pressure 144/92, pulse 143, height 5\' 11"  (1.803 m), weight 237 lb 6.4 oz (107.684 kg). General: Well developed, well nourished, in no acute distress.  Head:  Normocephalic, atraumatic, sclera non-icteric, mucus membranes are moist,   Neck: Supple. Carotids are 2 + without bruits. No JVD  Lungs: Clear bilaterally to auscultation.  Heart: His heart is irregularly irregular. He is very tachycardic.Marland Kitchen  normal  S1 S2. No murmurs, gallops or rubs.  Abdomen: Soft, non-tender, non-distended with normal bowel sounds. No hepatomegaly. No rebound/guarding. No masses.  Msk:  Strength and tone are normal  Extremities: No clubbing or cyanosis. No edema.  Distal pedal pulses are 2+ and equal bilaterally.  Neuro: Alert and oriented X 3. Moves all extremities spontaneously.  Psych:  Responds to questions appropriately with a normal affect.  ECG: 2014: Atrial flutter with variable AV block. His ventricular rate is 143. His moderate voltage criteria for left ventricular hypertrophy. He has associated ST and T wave changes. Assessment / Plan:

## 2012-12-31 NOTE — Patient Instructions (Addendum)
Your physician has requested that you have an echocardiogram IN 2 WEEKS. Echocardiography is a painless test that uses sound waves to create images of your heart. It provides your doctor with information about the size and shape of your heart and how well your heart's chambers and valves are working. This procedure takes approximately one hour. There are no restrictions for this procedure.  STOP DRINKING EXCESSIVE WATER  NEED AN APP WITH COUMADIN CLINIC THIS WEEK 7/16 OR 7/17 TO REESTABLISH.   Your physician has recommended you make the following change in your medication:  COUMADIN 5 MG TABLET, TAKE 1.5 TABLETS EACH EVENING AND BE RETESTED THIS WEEK.   Your physician recommends that you schedule a follow-up appointment in: 1 MONTH WITH Norma Fredrickson NP WITH EKG AND BMET

## 2013-01-07 ENCOUNTER — Ambulatory Visit (HOSPITAL_COMMUNITY): Payer: Self-pay | Attending: Cardiology | Admitting: Radiology

## 2013-01-07 ENCOUNTER — Other Ambulatory Visit: Payer: Self-pay

## 2013-01-07 ENCOUNTER — Telehealth: Payer: Self-pay | Admitting: *Deleted

## 2013-01-07 DIAGNOSIS — I428 Other cardiomyopathies: Secondary | ICD-10-CM | POA: Insufficient documentation

## 2013-01-07 DIAGNOSIS — I420 Dilated cardiomyopathy: Secondary | ICD-10-CM

## 2013-01-07 DIAGNOSIS — I4892 Unspecified atrial flutter: Secondary | ICD-10-CM

## 2013-01-07 DIAGNOSIS — G4733 Obstructive sleep apnea (adult) (pediatric): Secondary | ICD-10-CM | POA: Insufficient documentation

## 2013-01-07 DIAGNOSIS — I4891 Unspecified atrial fibrillation: Secondary | ICD-10-CM | POA: Insufficient documentation

## 2013-01-07 DIAGNOSIS — E119 Type 2 diabetes mellitus without complications: Secondary | ICD-10-CM | POA: Insufficient documentation

## 2013-01-07 DIAGNOSIS — I509 Heart failure, unspecified: Secondary | ICD-10-CM | POA: Insufficient documentation

## 2013-01-07 DIAGNOSIS — I5022 Chronic systolic (congestive) heart failure: Secondary | ICD-10-CM

## 2013-01-07 DIAGNOSIS — I10 Essential (primary) hypertension: Secondary | ICD-10-CM | POA: Insufficient documentation

## 2013-01-07 NOTE — Telephone Encounter (Signed)
Pt was called/ msg left to call back and reschedule coumadin app. Number provided.

## 2013-01-07 NOTE — Progress Notes (Signed)
Echocardiogram performed.  

## 2013-01-10 NOTE — Telephone Encounter (Signed)
msg left at home number asking to call and set up app for coumadin clinic. Called work number/ no longer works there.

## 2013-01-14 ENCOUNTER — Ambulatory Visit (INDEPENDENT_AMBULATORY_CARE_PROVIDER_SITE_OTHER): Payer: Self-pay | Admitting: *Deleted

## 2013-01-14 DIAGNOSIS — I4891 Unspecified atrial fibrillation: Secondary | ICD-10-CM

## 2013-01-14 DIAGNOSIS — Z7901 Long term (current) use of anticoagulants: Secondary | ICD-10-CM

## 2013-01-14 HISTORY — DX: Long term (current) use of anticoagulants: Z79.01

## 2013-01-14 NOTE — Patient Instructions (Addendum)

## 2013-01-16 ENCOUNTER — Encounter: Payer: Self-pay | Admitting: Family Medicine

## 2013-01-21 ENCOUNTER — Ambulatory Visit (INDEPENDENT_AMBULATORY_CARE_PROVIDER_SITE_OTHER): Payer: Self-pay | Admitting: *Deleted

## 2013-01-21 DIAGNOSIS — Z7901 Long term (current) use of anticoagulants: Secondary | ICD-10-CM

## 2013-01-21 DIAGNOSIS — I4891 Unspecified atrial fibrillation: Secondary | ICD-10-CM

## 2013-01-28 ENCOUNTER — Ambulatory Visit (INDEPENDENT_AMBULATORY_CARE_PROVIDER_SITE_OTHER): Payer: Self-pay | Admitting: *Deleted

## 2013-01-28 DIAGNOSIS — I4891 Unspecified atrial fibrillation: Secondary | ICD-10-CM

## 2013-01-28 DIAGNOSIS — I4892 Unspecified atrial flutter: Secondary | ICD-10-CM

## 2013-01-28 DIAGNOSIS — I428 Other cardiomyopathies: Secondary | ICD-10-CM

## 2013-01-28 DIAGNOSIS — I420 Dilated cardiomyopathy: Secondary | ICD-10-CM

## 2013-01-28 DIAGNOSIS — I5022 Chronic systolic (congestive) heart failure: Secondary | ICD-10-CM

## 2013-01-28 DIAGNOSIS — Z7901 Long term (current) use of anticoagulants: Secondary | ICD-10-CM

## 2013-01-28 MED ORDER — WARFARIN SODIUM 5 MG PO TABS: 5.0000 mg | ORAL_TABLET | ORAL | Status: DC

## 2013-02-04 ENCOUNTER — Ambulatory Visit (INDEPENDENT_AMBULATORY_CARE_PROVIDER_SITE_OTHER): Payer: Self-pay

## 2013-02-04 ENCOUNTER — Encounter: Payer: Self-pay | Admitting: Nurse Practitioner

## 2013-02-04 ENCOUNTER — Ambulatory Visit (INDEPENDENT_AMBULATORY_CARE_PROVIDER_SITE_OTHER): Payer: Self-pay | Admitting: Nurse Practitioner

## 2013-02-04 VITALS — BP 180/130 | HR 102 | Ht 71.0 in | Wt 229.4 lb

## 2013-02-04 DIAGNOSIS — R0989 Other specified symptoms and signs involving the circulatory and respiratory systems: Secondary | ICD-10-CM

## 2013-02-04 DIAGNOSIS — I420 Dilated cardiomyopathy: Secondary | ICD-10-CM

## 2013-02-04 DIAGNOSIS — I4891 Unspecified atrial fibrillation: Secondary | ICD-10-CM

## 2013-02-04 DIAGNOSIS — Z7901 Long term (current) use of anticoagulants: Secondary | ICD-10-CM

## 2013-02-04 DIAGNOSIS — R0609 Other forms of dyspnea: Secondary | ICD-10-CM

## 2013-02-04 DIAGNOSIS — E119 Type 2 diabetes mellitus without complications: Secondary | ICD-10-CM

## 2013-02-04 DIAGNOSIS — I428 Other cardiomyopathies: Secondary | ICD-10-CM

## 2013-02-04 DIAGNOSIS — R0600 Dyspnea, unspecified: Secondary | ICD-10-CM

## 2013-02-04 LAB — BASIC METABOLIC PANEL WITH GFR
BUN: 20 mg/dL (ref 6–23)
CO2: 26 meq/L (ref 19–32)
Calcium: 8.7 mg/dL (ref 8.4–10.5)
Chloride: 99 meq/L (ref 96–112)
Creatinine, Ser: 1.2 mg/dL (ref 0.4–1.5)
GFR: 89.37 mL/min
Glucose, Bld: 427 mg/dL — ABNORMAL HIGH (ref 70–99)
Potassium: 4.5 meq/L (ref 3.5–5.1)
Sodium: 135 meq/L (ref 135–145)

## 2013-02-04 LAB — BRAIN NATRIURETIC PEPTIDE: Pro B Natriuretic peptide (BNP): 454 pg/mL — ABNORMAL HIGH (ref 0.0–100.0)

## 2013-02-04 LAB — POCT INR: INR: 2.5

## 2013-02-04 LAB — HEMOGLOBIN A1C: Hgb A1c MFr Bld: 11.5 % — ABNORMAL HIGH (ref 4.6–6.5)

## 2013-02-04 MED ORDER — CARVEDILOL 25 MG PO TABS: 25.0000 mg | ORAL_TABLET | Freq: Two times a day (BID) | ORAL | Status: DC

## 2013-02-04 MED ORDER — LISINOPRIL 10 MG PO TABS: 10.0000 mg | ORAL_TABLET | Freq: Every day | ORAL | Status: DC

## 2013-02-04 NOTE — Progress Notes (Signed)
Johnathan Archer Date of Birth: 05/06/1971 Medical Record #[id number]  History of Present Illness: Johnathan Archer is seen back today for a one month check. Seen for Dr. Elease Hashimoto. He has a nonischemic CM - EF was 35% but most recent echo from July of 2014 shows EF down to 20% with severe LVH, HTN, nonobstructive CAD, type 2 DM, ED, sleep apnea and atrial fib - to be on coumadin. Has had noncompliance.   Seen a month ago after running out of his medicines - was in AF with RVR - Coreg was restarted. Need to consider getting him on ACE/ARB in the future. Has been on aldactone in the past apparently as well.   Comes back today. Here alone. Really has poor insight into his health issues - has not seen his PCP in "a very long time". Compliance has been an issue - I acknowledged this to him today. He has very poor insight into his health issues. He is fatigued and short of breath. Not much endurance. ? Ran out of his medicines today or yesterday - says he needed refills but there are refills on the bottle. Not on aldactone anymore - not sure why - but I am assuming he had ran out of in the past. Sugars are high. Weight is down 8 pounds. He tries to avoid salt. BP is quite high. Not dizzy or lightheaded. Not weighing daily.   Current Outpatient Prescriptions  Medication Sig Dispense Refill  . Blood Glucose Monitoring Suppl (CONTOUR BLOOD GLUCOSE SYSTEM) W/DEVICE KIT by Does not apply route as directed.      . furosemide (LASIX) 40 MG tablet Take 1 tablet (40 mg total) by mouth 2 (two) times daily.  60 tablet  5  . glipiZIDE (GLUCOTROL) 10 MG tablet Take 1 tablet (10 mg total) by mouth 2 (two) times daily before a meal.  60 tablet  1  . Lancets MISC by Does not apply route as needed. Contour      . potassium chloride (K-DUR,KLOR-CON) 10 MEQ tablet Take 1 tablet (10 mEq total) by mouth 2 (two) times daily.  60 tablet  6  . warfarin (COUMADIN) 5 MG tablet Take 1 tablet (5 mg total) by mouth as directed.   60 tablet  0  . carvedilol (COREG) 25 MG tablet Take 1 tablet (25 mg total) by mouth 2 (two) times daily.  180 tablet  3  . lisinopril (PRINIVIL,ZESTRIL) 10 MG tablet Take 1 tablet (10 mg total) by mouth daily.  90 tablet  3   No current facility-administered medications for this visit.    No Known Allergies  Past Medical History  Diagnosis Date  . Hypertension   . Type II or unspecified type diabetes mellitus without mention of complication, uncontrolled   . 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  cath 2004 negative for obstruction, last echo 07/2011 EF 35%. b) Normal coronary arteries by cath in 2004 (when EF was noted at 15%). c) Last EF 35% by echo 08/16/11  . History of chicken pox   . OSA (obstructive sleep apnea)   . History of pneumonia     2006 LLL PNA, 06/2011 RLL PNA  . Mitral regurgitation     Moderate by echo 07/2011  . ED (erectile dysfunction)   . Atrial fibrillation with RVR 10/2011    lone episode, converted with IV dilt, on coumadin given elevated CHADS2  . Warfarin anticoagulation  Past Surgical History  Procedure Laterality Date  . Hernia repair  2002  . Knee surgery      L knee in HS  . Cardiac catheterization  2004    no blockages  . US echocardiography  07/2011    mod LVH, EF 35%, diffuse hypokinesis of entire myocardium, irreversible restrictive pattern (grade 4 diastolic dysfunction), mod MR, mod dilated LA/RA, decreased RV systolic fxn, no PFO  . US echocardiography  12/2012    severe LVH, EF 20-25%, mod MR, reduced RV systolic function    History  Smoking status  . Never Smoker   Smokeless tobacco  . Never Used    History  Alcohol Use  . Yes    Comment: Occasional 1x/mo    Family History  Problem Relation Age of Onset  . Diabetes Mother   . Hypertension Father   . Stroke Neg Hx   . Coronary artery disease Neg Hx   . Cancer Neg Hx     Review of Systems: The  review of systems is per the HPI.  All other systems were reviewed and are negative.  Physical Exam: BP 180/130  Pulse 102  Ht 5\' 11"  (1.803 m)  Wt 229 lb 6.4 oz (104.055 kg)  BMI 32.01 kg/m2 Patient is alert and in no acute distress. Weight is down 8 pounds. Skin is warm and dry. Color is normal.  HEENT is unremarkable. Normocephalic/atraumatic. PERRL. Sclera are nonicteric. Neck is supple. No masses. No JVD. Lungs are fairly clear. Cardiac exam shows a regular rate and rhythm. He is tachycardic today. Abdomen is soft. Extremities are without edema. Gait and ROM are intact. No gross neurologic deficits noted.  LABORATORY DATA: Pending  EKG today shows sinus tach - rate of 103 - with LVH  Lab Results  Component Value Date   WBC 5.1 12/27/2012   HGB 13.5 12/27/2012   HCT 38.5* 12/27/2012   PLT 239 12/27/2012   GLUCOSE 261* 12/27/2012   CHOL 119 06/28/2011   TRIG 102.0 06/28/2011   HDL 47.40 06/28/2011   LDLCALC 51 06/28/2011   ALT 15 11/10/2011   AST 26 11/10/2011   NA 139 12/27/2012   K 3.9 12/27/2012   CL 101 12/27/2012   CREATININE 1.17 12/27/2012   BUN 16 12/27/2012   CO2 31 12/27/2012   TSH 1.32 06/28/2011   INR 1.9 01/28/2013   HGBA1C 10.6* 08/29/2011   MICROALBUR 5.8* 06/28/2011   Lab Results  Component Value Date   INR 1.9 01/28/2013   INR 1.9 01/21/2013   INR 1.5 01/14/2013   Echo Study Conclusions from July 2014  - Left ventricle: The cavity size was mildly dilated. Wall thickness was increased in a pattern of severe LVH. Systolic function was severely reduced. The estimated ejection fraction was in the range of 20% to 25%. Diffuse hypokinesis. Doppler parameters are consistent with restrictive physiology, indicative of decreased left ventricular diastolic compliance and/or increased left atrial pressure. - Mitral valve: Moderate regurgitation. - Left atrium: The atrium was moderately dilated. - Right ventricle: Systolic function was moderately reduced. - Right atrium: The atrium  was mildly dilated. - Pulmonary arteries: Systolic pressure was mildly increased. PA peak pressure: 32mm Hg (S). - Pericardium, extracardiac: A trivial pericardial effusion was identified.    Assessment / Plan:  1. Nonischemic CM - EF of 20 to 25% - I have increased his Coreg to 25 mg BID. Adding back lisinopril 10 mg a day. Check BMET, BNP and A1C today. He has  poor insight into the severity of this problem - this was discussed in great detail with him. We have discussed possible ICD and even possible transplant. Compliance is key to him doing better and this was stressed numerous times at his visit today.   2. Noncompliance - discussed in depth today.   3. AF - with prior RVR - back on Coreg - back in sinus but rate still fast.   4. HTN - adding back his ACE as well today.   5. DM - no recent care - needs to get back to primary care. We will check an A1C today.   I will see him in 2 to 3 weeks - try to titrate up his medicines. Recheck echo after 3 months of sufficient therapy.   Patient is agreeable to this plan and will call if any problems develop in the interim.   Rosalio Macadamia, RN, ANP-C [facility] [address] [place], [place]  [address]

## 2013-02-04 NOTE — Patient Instructions (Addendum)
Continue with your current medicines but we need to increase the Coreg to 25 mg two times a day  I am adding Lisinopril 10 mg a day.  We need to check labs today  Take your medicines!!  Weigh yourself each morning and record.  Take extra dose of diuretic for weight gain of 3 pounds in 24 hours.   Limit sodium intake. Goal is to have less than 2000 mg (2gm) of salt per day.  I will see you in 2 to 3 weeks  Call the [facility] Care office at [phone] if you have any questions, problems or concerns.

## 2013-02-26 ENCOUNTER — Encounter: Payer: Self-pay | Admitting: Nurse Practitioner

## 2013-02-26 ENCOUNTER — Ambulatory Visit (INDEPENDENT_AMBULATORY_CARE_PROVIDER_SITE_OTHER): Payer: Self-pay | Admitting: Nurse Practitioner

## 2013-02-26 VITALS — BP 160/110 | HR 88 | Ht 71.0 in | Wt 229.0 lb

## 2013-02-26 DIAGNOSIS — I5022 Chronic systolic (congestive) heart failure: Secondary | ICD-10-CM

## 2013-02-26 LAB — BASIC METABOLIC PANEL
BUN: 19 mg/dL (ref 6–23)
CO2: 28 mEq/L (ref 19–32)
Calcium: 8.4 mg/dL (ref 8.4–10.5)
Chloride: 99 mEq/L (ref 96–112)
Creatinine, Ser: 1.2 mg/dL (ref 0.4–1.5)
GFR: 85.07 mL/min (ref >60.00)
Glucose, Bld: 431 mg/dL — ABNORMAL HIGH (ref 70–99)
Potassium: 4.2 mEq/L (ref 3.5–5.1)
Sodium: 135 mEq/L (ref 135–145)

## 2013-02-26 MED ORDER — SPIRONOLACTONE 25 MG PO TABS: 25.0000 mg | ORAL_TABLET | Freq: Every day | ORAL | Status: DC

## 2013-02-26 NOTE — Progress Notes (Signed)
Johnathan Archer Date of Birth: 20-Oct-1970 Medical Record #[id number]  History of Present Illness: Johnathan Archer is seen back today for a 3 week check. Seen for Dr. Elease Hashimoto. He has a nonischemic CM with an EF of 35% by most recent echo from July of 2014 showed EF down to 20% with severe LVH, HTN, nonobstructive CAD, type 2 DM, ED, sleep apnea and atrial fib - to be on coumadin. Has had noncompliance.   Seen earlier this summer after running out of his medicines - was in AF with RVR - Coreg was restarted.   I saw him 3 weeks ago - had very poor insight in to his issues. Coreg was increased. ACE was added back. Tried to get him back to PCP for his diabetes management.   Comes back today. Here alone. Doing ok. Actually feeling some better. Not as fatigued. 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 Not dizzy or lightheaded. Weight has been stable. Not short of breath. No swelling. BP remains up. Still hasn't seen his PCP.    Current Outpatient Prescriptions  Medication Sig Dispense Refill  . Blood Glucose Monitoring Suppl (CONTOUR BLOOD GLUCOSE SYSTEM) W/DEVICE KIT by Does not apply route as directed.      . carvedilol (COREG) 25 MG tablet Take 1 tablet (25 mg total) by mouth 2 (two) times daily.  180 tablet  3  . furosemide (LASIX) 40 MG tablet Take 1 tablet (40 mg total) by mouth 2 (two) times daily.  60 tablet  5  . glipiZIDE (GLUCOTROL) 10 MG tablet Take 1 tablet (10 mg total) by mouth 2 (two) times daily before a meal.  60 tablet  1  . Lancets MISC by Does not apply route as needed. Contour      . lisinopril (PRINIVIL,ZESTRIL) 10 MG tablet Take 1 tablet (10 mg total) by mouth daily.  90 tablet  3  . potassium chloride (K-DUR,KLOR-CON) 10 MEQ tablet Take 1 tablet (10 mEq total) by mouth 2 (two) times daily.  60 tablet  6  . warfarin (COUMADIN) 5 MG tablet Take 1 tablet (5 mg total) by mouth as directed.  60 tablet  0   No current facility-administered medications for this visit.     No Known Allergies  Past Medical History  Diagnosis Date  . Hypertension   . Type II or unspecified type diabetes mellitus without mention of complication, uncontrolled   . 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  cath 2004 negative for obstruction, last echo 07/2011 EF 35%. b) Normal coronary arteries by cath in 2004 (when EF was noted at 15%). c) Last EF 35% by echo 08/16/11  . History of chicken pox   . OSA (obstructive sleep apnea)   . History of pneumonia     2006 LLL PNA, 06/2011 RLL PNA  . Mitral regurgitation     Moderate by echo 07/2011  . ED (erectile dysfunction)   . Atrial fibrillation with RVR 10/2011    lone episode, converted with IV dilt, on coumadin given elevated CHADS2  . Warfarin anticoagulation     Past Surgical History  Procedure Laterality Date  . Hernia repair  2002  . Knee surgery      L knee in HS  . Cardiac catheterization  2004    no blockages  . US echocardiography  07/2011    mod LVH, EF 35%, diffuse hypokinesis of  entire myocardium, irreversible restrictive pattern (grade 4 diastolic dysfunction), mod MR, mod dilated LA/RA, decreased RV systolic fxn, no PFO  . US echocardiography  12/2012    severe LVH, EF 20-25%, mod MR, reduced RV systolic function    History  Smoking status  . Never Smoker   Smokeless tobacco  . Never Used    History  Alcohol Use  . Yes    Comment: Occasional 1x/mo    Family History  Problem Relation Age of Onset  . Diabetes Mother   . Hypertension Father   . Stroke Neg Hx   . Coronary artery disease Neg Hx   . Cancer Neg Hx     Review of Systems: The review of systems is per the HPI.  All other systems were reviewed and are negative.  Physical Exam: BP 160/110  Pulse 88  Ht 5\' 11"  (1.803 m)  Wt 229 lb (103.874 kg)  BMI 31.95 kg/m2 Patient is very pleasant and in no acute distress. Weight is stable. kin is warm and dry. Color is  normal.  HEENT is unremarkable. Normocephalic/atraumatic. PERRL. Sclera are nonicteric. Neck is supple. No masses. No JVD. Lungs are clear. Cardiac exam shows a regular rate and rhythm. Abdomen is soft. Extremities are without edema. Gait and ROM are intact. No gross neurologic deficits noted.  LABORATORY DATA: Lab Results  Component Value Date   WBC 5.1 12/27/2012   HGB 13.5 12/27/2012   HCT 38.5* 12/27/2012   PLT 239 12/27/2012   GLUCOSE 427* 02/04/2013   CHOL 119 06/28/2011   TRIG 102.0 06/28/2011   HDL 47.40 06/28/2011   LDLCALC 51 06/28/2011   ALT 15 11/10/2011   AST 26 11/10/2011   NA 135 02/04/2013   K 4.5 02/04/2013   CL 99 02/04/2013   CREATININE 1.2 02/04/2013   BUN 20 02/04/2013   CO2 26 02/04/2013   TSH 1.32 06/28/2011   INR 2.5 02/04/2013   HGBA1C 11.5* 02/04/2013   MICROALBUR 5.8* 06/28/2011     Assessment / Plan: 1. Nonischemic CM - EF of 20 to 25% - On beta blocker and ACE. I am adding aldactone 25 mg daily. Check BMET today and again in a week - hope to cut his potassium back. Continue to titrate his medicines and plan for repeat echo for further disposition regarding possible ICD/CHF referral, etc.   2. HTN - Aldactone added today.  3. AF - in sinus on exam. Rate has improved.  4. DM - uncontrolled - not sure he is going to see his PCP. I have tried to advise him that he needs follow up soon.   I will see him back in 2 to 3 weeks. Check BMET today and again in a week.   Patient is agreeable to this plan and will call if any problems develop in the interim.   Rosalio Macadamia, RN, ANP-C [facility] [address] [place], [place]  [address]

## 2013-02-26 NOTE — Patient Instructions (Addendum)
We need to check lab today and again in one week  I am adding Aldactone 25 mg daily to your medicines - stay on all your other medicines for now - I may cut your potassium back after I see your lab work  Keep avoiding salt  Keep weighing daily  I will see you in 2 to 3 weeks (try to put on day that Dr. Elease Hashimoto is here)  Call the [facility] office at [phone] if you have any questions, problems or concerns.

## 2013-03-04 ENCOUNTER — Telehealth: Payer: Self-pay | Admitting: *Deleted

## 2013-03-04 DIAGNOSIS — I420 Dilated cardiomyopathy: Secondary | ICD-10-CM

## 2013-03-04 DIAGNOSIS — I4892 Unspecified atrial flutter: Secondary | ICD-10-CM

## 2013-03-04 DIAGNOSIS — I4891 Unspecified atrial fibrillation: Secondary | ICD-10-CM

## 2013-03-04 DIAGNOSIS — I5022 Chronic systolic (congestive) heart failure: Secondary | ICD-10-CM

## 2013-03-04 MED ORDER — WARFARIN SODIUM 5 MG PO TABS: 5.0000 mg | ORAL_TABLET | ORAL | Status: DC

## 2013-03-04 NOTE — Telephone Encounter (Signed)
Made pt appt for coumadin clinic, delinquent with visit, sent in 7 days worth coumadin

## 2013-03-04 NOTE — Telephone Encounter (Signed)
Patient called requesting a coumadin refill. He stated that [facility] told him that they have made multiple attempts to get this refilled but have had no response. He is at the pharmacy now. [facility] [address].

## 2013-03-05 ENCOUNTER — Ambulatory Visit (INDEPENDENT_AMBULATORY_CARE_PROVIDER_SITE_OTHER): Payer: Self-pay | Admitting: Pharmacist

## 2013-03-05 ENCOUNTER — Other Ambulatory Visit: Payer: Self-pay

## 2013-03-05 DIAGNOSIS — I4891 Unspecified atrial fibrillation: Secondary | ICD-10-CM

## 2013-03-05 DIAGNOSIS — Z7901 Long term (current) use of anticoagulants: Secondary | ICD-10-CM

## 2013-03-05 DIAGNOSIS — I5022 Chronic systolic (congestive) heart failure: Secondary | ICD-10-CM

## 2013-03-05 LAB — BASIC METABOLIC PANEL
BUN: 19 mg/dL (ref 6–23)
CO2: 28 mEq/L (ref 19–32)
Calcium: 8.4 mg/dL (ref 8.4–10.5)
Chloride: 103 mEq/L (ref 96–112)
Creatinine, Ser: 1.1 mg/dL (ref 0.4–1.5)
GFR: 98.15 mL/min (ref >60.00)
Glucose, Bld: 367 mg/dL — ABNORMAL HIGH (ref 70–99)
Potassium: 3.5 mEq/L (ref 3.5–5.1)
Sodium: 136 mEq/L (ref 135–145)

## 2013-03-05 LAB — POCT INR: INR: 1.1

## 2013-03-08 ENCOUNTER — Telehealth: Payer: Self-pay | Admitting: Pharmacist

## 2013-03-08 DIAGNOSIS — I5022 Chronic systolic (congestive) heart failure: Secondary | ICD-10-CM

## 2013-03-08 DIAGNOSIS — I4892 Unspecified atrial flutter: Secondary | ICD-10-CM

## 2013-03-08 DIAGNOSIS — I4891 Unspecified atrial fibrillation: Secondary | ICD-10-CM

## 2013-03-08 DIAGNOSIS — I420 Dilated cardiomyopathy: Secondary | ICD-10-CM

## 2013-03-08 MED ORDER — WARFARIN SODIUM 5 MG PO TABS: ORAL_TABLET | ORAL | Status: DC

## 2013-03-08 NOTE — Telephone Encounter (Signed)
New Problem  Pt needs to let the Coumadin know that he ordered a script for Warfarin and should see it come through if not he is out and needs a script sent in.  Please call back to discuss.

## 2013-03-08 NOTE — Telephone Encounter (Signed)
Rx  sent  to  [facility]

## 2013-03-20 ENCOUNTER — Ambulatory Visit: Payer: Self-pay | Admitting: Nurse Practitioner

## 2013-04-09 ENCOUNTER — Telehealth: Payer: Self-pay | Admitting: Cardiovascular Disease

## 2013-04-09 DIAGNOSIS — I5022 Chronic systolic (congestive) heart failure: Secondary | ICD-10-CM

## 2013-04-09 DIAGNOSIS — I4891 Unspecified atrial fibrillation: Secondary | ICD-10-CM

## 2013-04-09 DIAGNOSIS — I4892 Unspecified atrial flutter: Secondary | ICD-10-CM

## 2013-04-09 DIAGNOSIS — I420 Dilated cardiomyopathy: Secondary | ICD-10-CM

## 2013-04-09 NOTE — Telephone Encounter (Signed)
New message ° ° ° ° °Refill warfarin------[facility] [address] °

## 2013-04-12 MED ORDER — WARFARIN SODIUM 5 MG PO TABS: ORAL_TABLET | ORAL | Status: DC

## 2013-04-12 NOTE — Telephone Encounter (Signed)
Spoke with pt. He is at the pharmacy to get his prescription.  Explained to him we cannot refill until he comes to get his INR check.  States he cannot come until next week.  Will send in enough tablets to make it to his next appt.

## 2013-04-25 ENCOUNTER — Encounter (INDEPENDENT_AMBULATORY_CARE_PROVIDER_SITE_OTHER): Payer: Self-pay

## 2013-04-25 ENCOUNTER — Ambulatory Visit (INDEPENDENT_AMBULATORY_CARE_PROVIDER_SITE_OTHER): Payer: Self-pay | Admitting: *Deleted

## 2013-04-25 DIAGNOSIS — I4892 Unspecified atrial flutter: Secondary | ICD-10-CM

## 2013-04-25 DIAGNOSIS — I4891 Unspecified atrial fibrillation: Secondary | ICD-10-CM

## 2013-04-25 DIAGNOSIS — I420 Dilated cardiomyopathy: Secondary | ICD-10-CM

## 2013-04-25 DIAGNOSIS — I5022 Chronic systolic (congestive) heart failure: Secondary | ICD-10-CM

## 2013-04-25 DIAGNOSIS — Z7901 Long term (current) use of anticoagulants: Secondary | ICD-10-CM

## 2013-04-25 DIAGNOSIS — I428 Other cardiomyopathies: Secondary | ICD-10-CM

## 2013-04-25 LAB — POCT INR: INR: 1

## 2013-04-25 MED ORDER — WARFARIN SODIUM 5 MG PO TABS: ORAL_TABLET | ORAL | Status: DC

## 2013-05-02 ENCOUNTER — Ambulatory Visit (INDEPENDENT_AMBULATORY_CARE_PROVIDER_SITE_OTHER): Payer: Self-pay | Admitting: *Deleted

## 2013-05-02 DIAGNOSIS — Z7901 Long term (current) use of anticoagulants: Secondary | ICD-10-CM

## 2013-05-02 DIAGNOSIS — I4891 Unspecified atrial fibrillation: Secondary | ICD-10-CM

## 2013-05-22 ENCOUNTER — Other Ambulatory Visit: Payer: Self-pay | Admitting: Cardiovascular Disease

## 2013-06-07 ENCOUNTER — Other Ambulatory Visit: Payer: Self-pay | Admitting: Cardiovascular Disease

## 2013-06-17 ENCOUNTER — Ambulatory Visit (INDEPENDENT_AMBULATORY_CARE_PROVIDER_SITE_OTHER): Payer: Self-pay

## 2013-06-17 DIAGNOSIS — Z7901 Long term (current) use of anticoagulants: Secondary | ICD-10-CM

## 2013-06-17 DIAGNOSIS — I4891 Unspecified atrial fibrillation: Secondary | ICD-10-CM

## 2013-06-17 LAB — POCT INR: INR: 0.9

## 2013-06-17 MED ORDER — WARFARIN SODIUM 5 MG PO TABS: ORAL_TABLET | ORAL | Status: DC

## 2013-06-18 ENCOUNTER — Other Ambulatory Visit: Payer: Self-pay | Admitting: Cardiovascular Disease

## 2013-06-26 ENCOUNTER — Ambulatory Visit: Payer: BC Managed Care – PPO | Admitting: Physician Assistant

## 2013-07-05 ENCOUNTER — Inpatient Hospital Stay (HOSPITAL_COMMUNITY)
Admission: EM | Admit: 2013-07-05 | Discharge: 2013-07-07 | DRG: 308 | Disposition: A | Discharge status: Home / Self Care | Payer: BC Managed Care – PPO | Attending: Internal Medicine | Admitting: Internal Medicine

## 2013-07-05 ENCOUNTER — Emergency Department (HOSPITAL_COMMUNITY): Payer: BC Managed Care – PPO

## 2013-07-05 ENCOUNTER — Encounter (HOSPITAL_COMMUNITY): Payer: Self-pay | Admitting: Emergency Medicine

## 2013-07-05 DIAGNOSIS — Z8249 Family history of ischemic heart disease and other diseases of the circulatory system: Secondary | ICD-10-CM

## 2013-07-05 DIAGNOSIS — I420 Dilated cardiomyopathy: Secondary | ICD-10-CM

## 2013-07-05 DIAGNOSIS — Z91199 Patient's noncompliance with other medical treatment and regimen due to unspecified reason: Secondary | ICD-10-CM

## 2013-07-05 DIAGNOSIS — R7309 Other abnormal glucose: Secondary | ICD-10-CM

## 2013-07-05 DIAGNOSIS — Z9119 Patient's noncompliance with other medical treatment and regimen: Secondary | ICD-10-CM

## 2013-07-05 DIAGNOSIS — Z833 Family history of diabetes mellitus: Secondary | ICD-10-CM

## 2013-07-05 DIAGNOSIS — R000 Tachycardia, unspecified: Secondary | ICD-10-CM

## 2013-07-05 DIAGNOSIS — Z7901 Long term (current) use of anticoagulants: Secondary | ICD-10-CM

## 2013-07-05 DIAGNOSIS — E1101 Type 2 diabetes mellitus with hyperosmolarity with coma: Secondary | ICD-10-CM | POA: Diagnosis present

## 2013-07-05 DIAGNOSIS — N529 Male erectile dysfunction, unspecified: Secondary | ICD-10-CM

## 2013-07-05 DIAGNOSIS — I10 Essential (primary) hypertension: Secondary | ICD-10-CM

## 2013-07-05 DIAGNOSIS — R079 Chest pain, unspecified: Secondary | ICD-10-CM

## 2013-07-05 DIAGNOSIS — I5042 Chronic combined systolic (congestive) and diastolic (congestive) heart failure: Secondary | ICD-10-CM

## 2013-07-05 DIAGNOSIS — Z23 Encounter for immunization: Secondary | ICD-10-CM

## 2013-07-05 DIAGNOSIS — IMO0001 Reserved for inherently not codable concepts without codable children: Secondary | ICD-10-CM

## 2013-07-05 DIAGNOSIS — Z8701 Personal history of pneumonia (recurrent): Secondary | ICD-10-CM

## 2013-07-05 DIAGNOSIS — G4733 Obstructive sleep apnea (adult) (pediatric): Secondary | ICD-10-CM

## 2013-07-05 DIAGNOSIS — E1100 Type 2 diabetes mellitus with hyperosmolarity without nonketotic hyperglycemic-hyperosmolar coma (NKHHC): Secondary | ICD-10-CM

## 2013-07-05 DIAGNOSIS — I428 Other cardiomyopathies: Secondary | ICD-10-CM | POA: Diagnosis present

## 2013-07-05 DIAGNOSIS — I4891 Unspecified atrial fibrillation: Principal | ICD-10-CM | POA: Diagnosis present

## 2013-07-05 DIAGNOSIS — I4892 Unspecified atrial flutter: Secondary | ICD-10-CM

## 2013-07-05 DIAGNOSIS — E1165 Type 2 diabetes mellitus with hyperglycemia: Secondary | ICD-10-CM

## 2013-07-05 DIAGNOSIS — I509 Heart failure, unspecified: Secondary | ICD-10-CM | POA: Diagnosis present

## 2013-07-05 DIAGNOSIS — R739 Hyperglycemia, unspecified: Secondary | ICD-10-CM

## 2013-07-05 DIAGNOSIS — I5022 Chronic systolic (congestive) heart failure: Secondary | ICD-10-CM

## 2013-07-05 LAB — GLUCOSE, CAPILLARY
GLUCOSE-CAPILLARY: 493 mg/dL — AB (ref 70–99)
Glucose-Capillary: 517 mg/dL — ABNORMAL HIGH (ref 70–99)
Glucose-Capillary: 338 mg/dL — ABNORMAL HIGH (ref 70–99)

## 2013-07-05 LAB — BASIC METABOLIC PANEL
BUN: 24 mg/dL — ABNORMAL HIGH (ref 6–23)
BUN: 22 mg/dL (ref 6–23)
BUN: 26 mg/dL — ABNORMAL HIGH (ref 6–23)
CO2: 22 meq/L (ref 19–32)
CO2: 25 mEq/L (ref 19–32)
CO2: 26 mEq/L (ref 19–32)
Calcium: 9 mg/dL (ref 8.4–10.5)
Calcium: 8.7 mg/dL (ref 8.4–10.5)
Calcium: 8.4 mg/dL (ref 8.4–10.5)
Chloride: 93 mEq/L — ABNORMAL LOW (ref 96–112)
Chloride: 96 mEq/L (ref 96–112)
Chloride: 94 mEq/L — ABNORMAL LOW (ref 96–112)
Creatinine, Ser: 1.22 mg/dL (ref 0.50–1.35)
Creatinine, Ser: 1.14 mg/dL (ref 0.50–1.35)
Creatinine, Ser: 1.31 mg/dL (ref 0.50–1.35)
GFR calc Af Amer: 90 mL/min — ABNORMAL LOW (ref >90)
GFR calc non Af Amer: 71 mL/min — ABNORMAL LOW (ref >90)
GFR calc non Af Amer: 77 mL/min — ABNORMAL LOW (ref >90)
GFR, EST AFRICAN AMERICAN: 82 mL/min — AB (ref >90)
GFR, EST AFRICAN AMERICAN: 76 mL/min — AB (ref >90)
GFR, EST NON AFRICAN AMERICAN: 65 mL/min — AB (ref >90)
GLUCOSE: 484 mg/dL — AB (ref 70–99)
Glucose, Bld: 542 mg/dL — ABNORMAL HIGH (ref 70–99)
Glucose, Bld: 418 mg/dL — ABNORMAL HIGH (ref 70–99)
POTASSIUM: 4.3 meq/L (ref 3.7–5.3)
POTASSIUM: 4.1 meq/L (ref 3.7–5.3)
Potassium: 3.9 mEq/L (ref 3.7–5.3)
SODIUM: 135 meq/L — AB (ref 137–147)
SODIUM: 135 meq/L — AB (ref 137–147)
Sodium: 133 mEq/L — ABNORMAL LOW (ref 137–147)

## 2013-07-05 LAB — PROTIME-INR
INR: 1.7 — ABNORMAL HIGH (ref 0.00–1.49)
Prothrombin Time: 19.5 seconds — ABNORMAL HIGH (ref 11.6–15.2)

## 2013-07-05 LAB — CBC WITH DIFFERENTIAL/PLATELET
BASOS ABS: 0 10*3/uL (ref 0.0–0.1)
BASOS PCT: 0 % (ref 0–1)
Eosinophils Absolute: 0.1 10*3/uL (ref 0.0–0.7)
Eosinophils Relative: 1 % (ref 0–5)
HEMATOCRIT: 41.7 % (ref 39.0–52.0)
Hemoglobin: 15.2 g/dL (ref 13.0–17.0)
LYMPHS PCT: 30 % (ref 12–46)
Lymphs Abs: 1.6 10*3/uL (ref 0.7–4.0)
MCH: 31 pg (ref 26.0–34.0)
MCHC: 36.5 g/dL — AB (ref 30.0–36.0)
MCV: 85.1 fL (ref 78.0–100.0)
Monocytes Absolute: 0.4 10*3/uL (ref 0.1–1.0)
Monocytes Relative: 8 % (ref 3–12)
NEUTROS ABS: 3.3 10*3/uL (ref 1.7–7.7)
NEUTROS PCT: 61 % (ref 43–77)
PLATELETS: 257 10*3/uL (ref 150–400)
RBC: 4.9 MIL/uL (ref 4.22–5.81)
RDW: 12.1 % (ref 11.5–15.5)
WBC: 5.5 10*3/uL (ref 4.0–10.5)

## 2013-07-05 LAB — PRO B NATRIURETIC PEPTIDE: Pro B Natriuretic peptide (BNP): 683.1 pg/mL — ABNORMAL HIGH (ref 0–125)

## 2013-07-05 LAB — POCT I-STAT TROPONIN I: Troponin i, poc: 0.03 ng/mL (ref 0.00–0.08)

## 2013-07-05 LAB — TROPONIN I: Troponin I: <0.3 ng/mL (ref <0.30)

## 2013-07-05 LAB — MRSA PCR SCREENING: MRSA by PCR: NEGATIVE

## 2013-07-05 LAB — MAGNESIUM: MAGNESIUM: 2 mg/dL (ref 1.5–2.5)

## 2013-07-05 MED ORDER — AMIODARONE LOAD VIA INFUSION
150.0000 mg | Freq: Once | INTRAVENOUS | Status: AC
  Administered 2013-07-05: 150 mg via INTRAVENOUS
  Filled 2013-07-05: qty 83.34

## 2013-07-05 MED ORDER — ONDANSETRON HCL 4 MG/2ML IJ SOLN: 4.0000 mg | Freq: Four times a day (QID) | INTRAMUSCULAR | Status: DC | PRN

## 2013-07-05 MED ORDER — FUROSEMIDE 40 MG PO TABS
40.0000 mg | ORAL_TABLET | Freq: Two times a day (BID) | ORAL | Status: DC
  Administered 2013-07-05 – 2013-07-07 (×4): 40 mg via ORAL
  Filled 2013-07-05 (×6): qty 1

## 2013-07-05 MED ORDER — AMIODARONE HCL IN DEXTROSE 360-4.14 MG/200ML-% IV SOLN
30.0000 mg/h | INTRAVENOUS | Status: DC
  Administered 2013-07-05 – 2013-07-06 (×2): 30 mg/h via INTRAVENOUS
  Filled 2013-07-05 (×4): qty 200

## 2013-07-05 MED ORDER — SPIRONOLACTONE 12.5 MG HALF TABLET
12.5000 mg | ORAL_TABLET | Freq: Every day | ORAL | Status: DC
  Administered 2013-07-05 – 2013-07-07 (×3): 12.5 mg via ORAL
  Filled 2013-07-05 (×3): qty 1

## 2013-07-05 MED ORDER — DILTIAZEM HCL 100 MG IV SOLR
5.0000 mg/h | Freq: Once | INTRAVENOUS | Status: AC
  Administered 2013-07-05: 5 mg/h via INTRAVENOUS

## 2013-07-05 MED ORDER — WARFARIN - PHARMACIST DOSING INPATIENT
Freq: Every day | Status: DC
  Administered 2013-07-06: 18:00:00

## 2013-07-05 MED ORDER — SODIUM CHLORIDE 0.9 % IV SOLN: INTRAVENOUS | Status: DC

## 2013-07-05 MED ORDER — SODIUM CHLORIDE 0.9 % IV SOLN
INTRAVENOUS | Status: DC
  Administered 2013-07-05: 4.3 [IU]/h via INTRAVENOUS
  Administered 2013-07-06: 2.4 [IU]/h via INTRAVENOUS
  Administered 2013-07-06: 1.6 [IU]/h via INTRAVENOUS
  Administered 2013-07-06: 4.3 [IU]/h via INTRAVENOUS
  Filled 2013-07-05: qty 1

## 2013-07-05 MED ORDER — ACETAMINOPHEN 650 MG RE SUPP: 650.0000 mg | Freq: Four times a day (QID) | RECTAL | Status: DC | PRN

## 2013-07-05 MED ORDER — ALBUTEROL SULFATE (2.5 MG/3ML) 0.083% IN NEBU: 2.5000 mg | INHALATION_SOLUTION | RESPIRATORY_TRACT | Status: DC | PRN

## 2013-07-05 MED ORDER — CARVEDILOL 6.25 MG PO TABS
6.2500 mg | ORAL_TABLET | Freq: Two times a day (BID) | ORAL | Status: DC
  Administered 2013-07-05 – 2013-07-07 (×4): 6.25 mg via ORAL
  Filled 2013-07-05 (×6): qty 1

## 2013-07-05 MED ORDER — SODIUM CHLORIDE 0.9 % IJ SOLN
3.0000 mL | Freq: Two times a day (BID) | INTRAMUSCULAR | Status: DC
  Administered 2013-07-05 – 2013-07-06 (×2): 3 mL via INTRAVENOUS

## 2013-07-05 MED ORDER — ADENOSINE 6 MG/2ML IV SOLN
INTRAVENOUS | Status: AC
  Administered 2013-07-05: 6 mg
  Filled 2013-07-05: qty 4

## 2013-07-05 MED ORDER — DEXTROSE 50 % IV SOLN: 25.0000 mL | INTRAVENOUS | Status: DC | PRN

## 2013-07-05 MED ORDER — DILTIAZEM HCL 25 MG/5ML IV SOLN
10.0000 mg | Freq: Once | INTRAVENOUS | Status: AC
  Administered 2013-07-05: 10 mg via INTRAVENOUS
  Filled 2013-07-05: qty 5

## 2013-07-05 MED ORDER — SODIUM CHLORIDE 0.9 % IJ SOLN: 10.0000 mL | INTRAMUSCULAR | Status: DC | PRN

## 2013-07-05 MED ORDER — LISINOPRIL 10 MG PO TABS
10.0000 mg | ORAL_TABLET | Freq: Every day | ORAL | Status: DC
  Administered 2013-07-06 – 2013-07-07 (×2): 10 mg via ORAL
  Filled 2013-07-05 (×2): qty 1

## 2013-07-05 MED ORDER — HEPARIN (PORCINE) IN NACL 100-0.45 UNIT/ML-% IJ SOLN
1600.0000 [IU]/h | INTRAMUSCULAR | Status: DC
  Administered 2013-07-05 – 2013-07-06 (×2): 1300 [IU]/h via INTRAVENOUS
  Administered 2013-07-07: 1600 [IU]/h via INTRAVENOUS
  Filled 2013-07-05 (×4): qty 250

## 2013-07-05 MED ORDER — WARFARIN SODIUM 2.5 MG PO TABS
2.5000 mg | ORAL_TABLET | Freq: Once | ORAL | Status: AC
  Administered 2013-07-05: 2.5 mg via ORAL
  Filled 2013-07-05: qty 1

## 2013-07-05 MED ORDER — ONDANSETRON HCL 4 MG PO TABS: 4.0000 mg | ORAL_TABLET | Freq: Four times a day (QID) | ORAL | Status: DC | PRN

## 2013-07-05 MED ORDER — INSULIN REGULAR BOLUS VIA INFUSION
0.0000 [IU] | Freq: Three times a day (TID) | INTRAVENOUS | Status: DC
  Administered 2013-07-06: 7 [IU] via INTRAVENOUS
  Filled 2013-07-05: qty 10

## 2013-07-05 MED ORDER — GUAIFENESIN-DM 100-10 MG/5ML PO SYRP
5.0000 mL | ORAL_SOLUTION | ORAL | Status: DC | PRN
Start: 2013-07-05 — End: 2013-07-07
  Filled 2013-07-05: qty 5

## 2013-07-05 MED ORDER — FUROSEMIDE 10 MG/ML IJ SOLN
40.0000 mg | Freq: Two times a day (BID) | INTRAMUSCULAR | Status: DC
  Administered 2013-07-05: 40 mg via INTRAVENOUS
  Filled 2013-07-05: qty 4

## 2013-07-05 MED ORDER — ACETAMINOPHEN 325 MG PO TABS: 650.0000 mg | ORAL_TABLET | Freq: Four times a day (QID) | ORAL | Status: DC | PRN

## 2013-07-05 MED ORDER — AMIODARONE HCL IN DEXTROSE 360-4.14 MG/200ML-% IV SOLN
60.0000 mg/h | INTRAVENOUS | Status: AC
  Administered 2013-07-05: 60 mg/h via INTRAVENOUS
  Filled 2013-07-05: qty 200

## 2013-07-05 MED ORDER — ASPIRIN 81 MG PO CHEW
324.0000 mg | CHEWABLE_TABLET | Freq: Once | ORAL | Status: AC
  Administered 2013-07-05: 324 mg via ORAL
  Filled 2013-07-05: qty 4

## 2013-07-05 MED ORDER — HEPARIN BOLUS VIA INFUSION
2000.0000 [IU] | Freq: Once | INTRAVENOUS | Status: AC
  Administered 2013-07-05: 2000 [IU] via INTRAVENOUS
  Filled 2013-07-05: qty 2000

## 2013-07-05 MED ORDER — DEXTROSE-NACL 5-0.45 % IV SOLN: INTRAVENOUS | Status: DC

## 2013-07-05 NOTE — ED Notes (Signed)
Cp and sob since 4 am took his coumadin has not taken asa

## 2013-07-05 NOTE — ED Notes (Signed)
10mg  cardizem bolus given per verbal order Dr. Elesa Massed

## 2013-07-05 NOTE — ED Notes (Signed)
Explained to pt that admitting MD wants pt to remain NPO due to CBG.

## 2013-07-05 NOTE — ED Notes (Signed)
MD at bedside. 

## 2013-07-05 NOTE — ED Notes (Signed)
PAds and zoll monitor placed on pt for adenosine

## 2013-07-05 NOTE — Progress Notes (Signed)
Peripherally Inserted Central Catheter/Midline Placement  The IV Nurse has discussed with the patient and/or persons authorized to consent for the patient, the purpose of this procedure and the potential benefits and risks involved with this procedure.  The benefits include less needle sticks, lab draws from the catheter and patient may be discharged home with the catheter.  Risks include, but not limited to, infection, bleeding, blood clot (thrombus formation), and puncture of an artery; nerve damage and irregular heat beat.  Alternatives to this procedure were also discussed.  PICC/Midline Placement Documentation  PICC Triple Lumen 07/05/13 PICC Right Basilic 48 cm 2.5 cm (Active)       Christeen Douglas 07/05/2013, 8:57 PM

## 2013-07-05 NOTE — Consult Note (Signed)
ELECTROPHYSIOLOGY CONSULT NOTE    Patient ID: Johnathan Archer MRN: [id number], DOB/AGE: January 24, 1971 43 y.o.  Admit date: 07/05/2013 Date of Consult: 07-05-2013  Primary Physician: Eustaquio BoydenJavier Gutierrez, MD Primary Cardiologist: Delane GingerPhil Nahser, MD  Reason for Consultation: atrial fibrillation  HPI:  Johnathan Archer is a 43 year old male with a past medical history significant for non ischemic cardiomyopathy (since 2004), diabetes, hypertension, atrial fibrillation, sleep apnea (on CPAP), and non compliance.  He was originally diagnosed with atrial fibrillation in May of 2013. He then had recurrent atrial fibrillation in July of 2014 after running out of his medications for a few days. He thinks he has been in SR since then.    He was in his usual state of health until this morning at 4AM when he awoke with tachypalpitations, fatigue, and shortness of breath.  He presented to the ER for further evaluation and was found to be in atrial flutter at a rate of 156bpm with degeneration into atrial fibrillation.  He has been treated with a Cardizem drip which has lowered his ventricular rate to 100's.  He is currently being placed on IV Amidorone.  He is not felt to be a good Tikosyn patient with history of non compliance.   Last INR 05/2013 0.9.  Patient states he has been on his Coumadin since that date.  He reports that he ran out of Lisinopril and Glipizide about a week ago.  He is not sure that he has been taking Coreg.   Labs are remarkable for glucose of 542, normal K and Magnesium, INR, TSH are pending.   Last echo 12/2012 demonstrated EF 20-25%, diffuse hypokinesis, moderate MR, LA 64.  Review of prior EKG's demonstrate atrial flutter with variable AV block.    He is married and works as a Production designer, theatre/television/filmmanager at [employer].  He does not use tobacco or recreational drugs.  He does report occasional alcohol use.    Past Medical History  Diagnosis Date  . Hypertension   . Type II or unspecified type diabetes  mellitus without mention of complication, uncontrolled   . 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  cath 2004 negative for obstruction, last echo 07/2011 EF 35%. b) Normal coronary arteries by cath in 2004 (when EF was noted at 15%). c) Last EF 20% by echo 12/2012  . History of chicken pox   . OSA (obstructive sleep apnea)   . Mitral regurgitation     Moderate by echo 07/2011  . ED (erectile dysfunction)   . Atrial fibrillation with RVR 10/2011    lone episode, converted with IV dilt, on coumadin given elevated CHADS2  . Warfarin anticoagulation      Surgical History:  Past Surgical History  Procedure Laterality Date  . Hernia repair  2002  . Knee surgery      L knee in HS  . Cardiac catheterization  2004    no blockages  . [place] echocardiography  07/2011    mod LVH, EF 35%, diffuse hypokinesis of entire myocardium, irreversible restrictive pattern (grade 4 diastolic dysfunction), mod MR, mod dilated LA/RA, decreased RV systolic fxn, no PFO  . [place] echocardiography  12/2012    severe LVH, EF 20-25%, mod MR, reduced RV systolic function    No current facility-administered medications on file prior to encounter.   Current Outpatient Prescriptions on File Prior to Encounter  Medication Sig Dispense Refill  . carvedilol (COREG) 25  MG tablet Take 1 tablet (25 mg total) by mouth 2 (two) times daily.  180 tablet  3  . glipiZIDE (GLUCOTROL) 10 MG tablet Take 1 tablet (10 mg total) by mouth 2 (two) times daily before a meal.  60 tablet  1  . lisinopril (PRINIVIL,ZESTRIL) 10 MG tablet Take 1 tablet (10 mg total) by mouth daily.  90 tablet  3  . spironolactone (ALDACTONE) 25 MG tablet Take 1 tablet (25 mg total) by mouth daily.  30 tablet  6     Inpatient Medications:  . carvedilol  6.25 mg Oral BID WC  . furosemide  40 mg Intravenous BID  . lisinopril  10 mg Oral Daily  . spironolactone  12.5 mg Oral Daily   . amiodarone      Followed by  . amiodarone (NEXTERONE PREMIX) 360 mg/200 mL dextrose     Followed by  . amiodarone (NEXTERONE PREMIX) 360 mg/200 mL dextrose       Allergies: No Known Allergies  History   Social History  . Marital Status: Married    Spouse Name: [name]    Number of Children: [name]  . Years of Education: [name]   Occupational History  . Not on file.   Social History Main Topics  . Smoking status: Never Smoker   . Smokeless tobacco: Never Used  . Alcohol Use: Yes     Comment: Occasional 1x/mo  . Drug Use: No  . Sexual Activity: Not on file   Other Topics Concern  . Not on file   Social History Narrative   Caffeine: none   Lives with wife and daughter (8yo), no pets   Occupation: Naval architect   Edu: 45yr college   Activity: works, no regular activity   Diet: water daily, tries fruits/vegetables daily     Family History  Problem Relation Age of Onset  . Diabetes Mother   . Hypertension Father   . Stroke Neg Hx   . Coronary artery disease Neg Hx   . Cancer Neg Hx      Physical Exam: Filed Vitals:   07/05/13 1740 07/05/13 1800 07/05/13 1900 07/05/13 2010  BP:  138/102 121/70 125/79  Pulse:   73 79  Temp:  98.3 F (36.8 C)  98 F (36.7 C)  TempSrc:  Oral  Oral  Resp:   22 22  Height: 5\' 11"  (1.803 m)     Weight: 225 lb 1.4 oz (102.1 kg)     SpO2: 97%  94% 96%    GEN- The patient is well appearing, alert and oriented x 3 today.   Head- normocephalic, atraumatic Eyes-  Sclera clear, conjunctiva pink Ears- hearing intact Oropharynx- clear Neck- supple, Lungs- bibasilar rales, normal work of breathing Heart- irregular rate and rhythm, 2/6 SEM at the apex, laterally displaced PMI GI- soft, NT, ND, + BS Extremities- no clubbing, cyanosis, or edema MS- no significant deformity or atrophy Skin- no rash or lesion Psych- euthymic mood, full affect Neuro- strength and sensation are intact  Labs:   Lab Results  Component Value Date   WBC 5.5 07/05/2013    HGB 15.2 07/05/2013   HCT 41.7 07/05/2013   MCV 85.1 07/05/2013   PLT 257 07/05/2013    Recent Labs Lab 07/05/13 1338  NA 133*  K 4.3  CL 93*  CO2 22  BUN 24*  CREATININE 1.22  CALCIUM 9.0  GLUCOSE 542*    Radiology/Studies: Dg Chest Port 1 View 07/05/2013   CLINICAL DATA:  Chest pressure.  Dizziness.  EXAM: PORTABLE CHEST - 1 VIEW  COMPARISON:  12/17/2012  FINDINGS: Artifact and support devices overlie the chest. The heart is mildly enlarged. Mediastinal shadows are normal. The lungs are clear. Vascularity is normal. No effusions.  IMPRESSION: No active disease.  Extensive overlying artifactual density.   Electronically Signed   By: Paulina Fusi M.D.   On: 07/05/2013 14:07    WJX:BJYNWGN atrial flutter, ventricular rate 156  TELEMETRY: atrial flutter with degeneration into afib (ventricular rates 90-110)  A/P 1. Atrial fibrillation and atrial flutter The patient has symptomatic atrial arrhythmias including both afib and atrial flutter.  He has severe LA enlargement and will almost certainly see progressive atrial arrhythmias with this degree of structural heart disease.  I would advise that he take coreg for rate control (He admits to noncompliance due to concerns of erectile dysfunction.)  His chads2vasc score is at least 3.  He therefore should be anticoagulated long term with coumadin or a novel agent.  He appears to prefer coumadin presently.  He is not a candidate for tikosyn given his history of noncompliance and has been started on IV amiodarone. Upon my discussion with the patient, I advised IV amiodarone over the weekend with TEE guided cardioversion on Monday.  He is clear that he will be leaving the hospital tomorrow and is reluctant to stay for cardioversion. I think that a reasonable alternative is to switch to oral amiodarone tomorrow and send him home once rate controlled.  He can return electively as an outpatient for cardioversion.  Given his LA size, he is not a candidate  for ablation.  2. Nonischemic cardiomyopathy The patient has surprisingly few symptoms with his nonischemic CM.  His echo suggests advanced structural heart disease.  I am concerned that given his medical noncompliance that his longterm prognosis is quite poor.  He is not a candidate for advanced heart failure therapies given his noncompliance.  He is at this point reluctant to stay in the hospital even for medical therapy.  He seems to have very little insight into the significance of his structural heart disease.  He has been noncompliant with coreg recently.  He is not a candidate for a defibrillator at this time.  Signed,  Hillis Range MD

## 2013-07-05 NOTE — Consult Note (Addendum)
Patient ID: Johnathan CompChristopher D Gassmann MRN: [id number], DOB/AGE: [birth year]   Admit date: 07/05/2013   Primary Physician: Eustaquio BoydenJavier Gutierrez, MD Primary Cardiologist: Dr. Elease HashimotoNahser  Pt. Profile:  Johnathan Archer is a 43 y.o. male with a history of  A-Fib (on coumadin) HTN, DM, OSA, non-ischemic cardiomyopathy (EF 20-25% per ECHO 2014), ED.  He is followed by Dr. Elease [facility]imotoNahser of [facility] and Dr. Diana EvesGuyierrez as PCP. Patient has Hx of medication non-compliance and reports not taking his heart or diabetes medication for over a week. He does report however to have taken his coumadin this morning. Patient reports awaking to onset of "chest tightness" 9/10 with diaphoresis at 0400 this morning. He denies N/V, dizziness, or palpitations. Symptoms improved over an hour while patient remained recumbent; however symptoms did not resolve. 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 By noon his symptoms had worsened and he was now becoming dizzy. [facility] where upon initial evaluation he appeared to be in SVT. Cardio pads and telemetry where placed on patient and adenosine administered at 1313. Dr. Elesa MassedWard then ordered the administration of 10 mg Cardizem bolus at1325 and then placed patient on Cardizem gtt at 5 mg. Patient's rate decreased to the 80's and blood pressure 128/67. Labs revealed negative troponin and elevated Pro-BNP. When cardiology arrived to bedside patient was in NAD, hemodynamically stable, and SpO2 98% on 2 L [place].     Study Conclusions - Left ventricle: The cavity size was mildly dilated. Wall thickness was increased in a pattern of severe LVH. Systolic function was severely reduced. The estimated ejection fraction was in the range of 20% to 25%. Diffuse hypokinesis. Doppler parameters are consistent with restrictive physiology, indicative of decreased left ventricular diastolic compliance and/or increased left atrial pressure. - Mitral valve:  Moderate regurgitation. - Left atrium: The atrium was moderately dilated. - Right ventricle: Systolic function was moderately reduced. - Right atrium: The atrium was mildly dilated. - Pulmonary arteries: Systolic pressure was mildly increased. PA peak pressure: 32mm Hg (S). - Pericardium, extracardiac: A trivial pericardial effusion was identified.   Problem List  Past Medical History  Diagnosis Date  . Hypertension   . Type II or unspecified type diabetes mellitus without mention of complication, uncontrolled   . 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  cath 2004 negative for obstruction, last echo 07/2011 EF 35%. b) Normal coronary arteries by cath in 2004 (when EF was noted at 15%). c) Last EF 35% by echo 08/16/11  . History of chicken pox   . OSA (obstructive sleep apnea)   . History of pneumonia     2006 LLL PNA, 06/2011 RLL PNA  . Mitral regurgitation     Moderate by echo 07/2011  . ED (erectile dysfunction)   . Atrial fibrillation with RVR 10/2011    lone episode, converted with IV dilt, on coumadin given elevated CHADS2  . Warfarin anticoagulation     Past Surgical History  Procedure Laterality Date  . Hernia repair  2002  . Knee surgery      L knee in HS  . Cardiac catheterization  2004    no blockages  . [place] echocardiography  07/2011    mod LVH, EF 35%, diffuse hypokinesis of entire myocardium, irreversible restrictive pattern (grade 4 diastolic dysfunction), mod MR, mod dilated LA/RA, decreased RV systolic fxn, no PFO  . [place]  echocardiography  12/2012    severe LVH, EF 20-25%, mod MR, reduced RV systolic function     Allergies  No Known Allergies  Home Medications  Prior to Admission medications   Medication Sig Start Date End Date Taking? Authorizing Provider  carvedilol (COREG) 25 MG tablet Take 1 tablet (25 mg total) by mouth 2 (two) times daily. 02/04/13  Yes Rosalio Macadamia, NP  furosemide  (LASIX) 40 MG tablet Take 40 mg by mouth 2 (two) times daily.   Yes Historical Provider, MD  glipiZIDE (GLUCOTROL) 10 MG tablet Take 1 tablet (10 mg total) by mouth 2 (two) times daily before a meal. 12/17/12  Yes Ankit Nanavati, MD  lisinopril (PRINIVIL,ZESTRIL) 10 MG tablet Take 1 tablet (10 mg total) by mouth daily. 02/04/13  Yes Rosalio Macadamia, NP  spironolactone (ALDACTONE) 25 MG tablet Take 1 tablet (25 mg total) by mouth daily. 02/26/13  Yes Rosalio Macadamia, NP  warfarin (COUMADIN) 5 MG tablet Take 10 mg by mouth daily.   Yes Historical Provider, MD    Family History  Family History  Problem Relation Age of Onset  . Diabetes Mother   . Hypertension Father   . Stroke Neg Hx   . Coronary artery disease Neg Hx   . Cancer Neg Hx     Social History  History   Social History  . Marital Status: Married    Spouse Name: [name]    Number of Children: [name]  . Years of Education: [name]   Occupational History  . Not on file.   Social History Main Topics  . Smoking status: Never Smoker   . Smokeless tobacco: Never Used  . Alcohol Use: Yes     Comment: Occasional 1x/mo  . Drug Use: No  . Sexual Activity: Not on file   Other Topics Concern  . Not on file   Social History Narrative   Caffeine: none   Lives with wife and daughter (44yo), no pets   Occupation: Naval architect   Edu: 36yr college   Activity: works, no regular activity   Diet: water daily, tries fruits/vegetables daily     All other systems reviewed and are otherwise negative except as noted above.  Physical Exam  Blood pressure 115/74, pulse 85, temperature 97.6 F (36.4 C), temperature source Oral, resp. rate 24, height 5\' 11"  (1.803 m), weight 225 lb (102.059 kg), SpO2 99.00%.  General: Pleasant, NAD. Obese A.A. Male. Appears fatigued.  Psych: Normal affect. Appropriately conversant.    Neuro: Alert and oriented X 3. Moves all extremities spontaneously. HEENT: Normal  Neck: Supple without bruits. JVD  difficult to discern due to neck girth and length. Carotid upstroke 2+.  Lungs:  Resp regular and unlabored, CTA.  Heart: Irregular rate and rhythm. No s3, s4, MRG. Cap refill < 3 sec. - Heaves or thrills.  Abdomen: Protuberant, soft, non-tender, non-distended, BS + x 4.  Extremities: No clubbing, cyanosis or edema. DP/PT/Radials 2+ and equal bilaterally.  Labs  No results found for this basename: CKTOTAL, CKMB, TROPONINI,  in the last 72 hours Lab Results  Component Value Date   WBC 5.5 07/05/2013   HGB 15.2 07/05/2013   HCT 41.7 07/05/2013   MCV 85.1 07/05/2013   PLT 257 07/05/2013    Recent Labs Lab 07/05/13 1338  NA 133*  K 4.3  CL 93*  CO2 22  BUN 24*  CREATININE 1.22  CALCIUM 9.0  GLUCOSE 542*      Radiology/Studies  Study Date: 01/07/2013 ------------------------------------------------------------ LV EF: 20% - 25% ------------------------------------------------------------ History: PMH: Acquired from the patient and from the patient's chart. PMH: Atrial Flutter with Rapid Ventricular Response. Dilated Cardiomyopathy. Obstructive Sleep Apnea. Pneumonia. CHF. Risk factors: Hypertension. Diabetes mellitus. ------------------------------------------------------------ Study Conclusions - Left ventricle: The cavity size was mildly dilated. Wall thickness was increased in a pattern of severe LVH. Systolic function was severely reduced. The estimated ejection fraction was in the range of 20% to 25%. Diffuse hypokinesis. Doppler parameters are consistent with restrictive physiology, indicative of decreased left ventricular diastolic compliance and/or increased left atrial pressure. - Mitral valve: Moderate regurgitation. - Left atrium: The atrium was moderately dilated. - Right ventricle: Systolic function was moderately reduced. - Right atrium: The atrium was mildly dilated. - Pulmonary arteries: Systolic pressure was mildly increased. PA peak pressure: 32mm Hg  (S). - Pericardium, extracardiac: A trivial pericardial effusion was identified. ------------------------------------------------------------ Left ventricle: The cavity size was mildly dilated. Wall thickness was increased in a pattern of severe LVH. Systolic function was severely reduced. The estimated ejection fraction was in the range of 20% to 25%. Diffuse hypokinesis. Doppler parameters are consistent with restrictive physiology, indicative of decreased left ventricular diastolic compliance and/or increased left atrial pressure. ------------------------------------------------------------ Aortic valve: Trileaflet; normal thickness leaflets. Mobility was not restricted. Doppler: Transvalvular velocity was within the normal range. There was no stenosis. No regurgitation. ------------------------------------------------------------ Aorta: Aortic root: The aortic root was normal in size. ------------------------------------------------------------ Mitral valve: Structurally normal valve. Mobility was not restricted. Doppler: Transvalvular velocity was within the normal range. There was no evidence for stenosis. Moderate regurgitation. Peak gradient: 5mm Hg (D). ------------------------------------------------------------ Left atrium: The atrium was moderately dilated. ------------------------------------------------------------ Right ventricle: The cavity size was normal. Systolic function was moderately reduced. ------------------------------------------------------------ Pulmonic valve: Doppler: Transvalvular velocity was within the normal range. There was no evidence for stenosis. Trivial regurgitation. ------------------------------------------------------------ Tricuspid valve: Structurally normal valve. Doppler: Transvalvular velocity was within the normal range. Mild regurgitation. ------------------------------------------------------------ Pulmonary artery: Systolic  pressure was mildly increased. ------------------------------------------------------------ Right atrium: The atrium was mildly dilated. ------------------------------------------------------------ Pericardium: A trivial pericardial effusion was identified. ------------------------------------------------------------ Systemic veins: Inferior vena cava: The vessel was dilated.     Dg Chest Port 1 View  07/05/2013   CLINICAL DATA:  Chest pressure.  Dizziness.  EXAM: PORTABLE CHEST - 1 VIEW  COMPARISON:  12/17/2012  FINDINGS: Artifact and support devices overlie the chest. The heart is mildly enlarged. Mediastinal shadows are normal. The lungs are clear. Vascularity is normal. No effusions.  IMPRESSION: No active disease.  Extensive overlying artifactual density.    ECG  A-fib with RVR   ASSESSMENT AND PLAN 1. Atrial Fib with RVR with concomanentt stystolic CHF (Pro-BNP 683.1).   Diltiazem gtt to be d/c and Amiodarone gtt initiated for improved rate control and possible conversion to PO upon discharge.   Home medications (Coreg 6.25 mg BID, Lisinopril 10 mg QD, & Spirolactone 12.5 mg QD) to be re-initiated. Lasix 40 mg BID will be administered to improve patient mild fluid over loaded status. INR to be drawn to evaluate patient compliance to coumadin therapy before possible cardioversion. Last INR measured 0.9 in 05/2013. A TSH will also be drawn to eliminate possible source of a-fib.  EP will be consulted for possible ablation related repeated occurences of atrial fib and patient's noncompliance with medical therapy. He is not a  Candidate for Ticlid therapy.  Hospitalitis service has been asked to admit patient for management of hyperglycemia (542) and DM. They have graciously agreed.  Signed, Pedro Earls, [employer] Student-NP 07/05/2013, 3:10 PM  Pager [phone]  Patient seen with PA, agree with the above note.   1. Atrial flutter/fibrillation: Symptomatic with dyspnea and  lightheadedness.  Initially, the patient appeared to be in atrial flutter at a rate of 150 bpm.  He subsequently went into atrial fibrillation with rate now in the 90s-100s.  He has history of paroxysmal atrial fibrillation.   He says that he has been taking warfarin, but most recent prior INR was 0.9.  Still pending INR today.  He is on diltiazem gtt.  - Would continue warfarin, check INR.  If subtherapeutic, would start heparin gtt (in case amiodarone converts him).  - Would not use diltiazem gtt for rate control, will transition to amiodarone gtt.   - Coreg 6.25 mg bid (has been on Coreg at home but was out of it).  - He does not tolerate atrial fibrillation/flutter well.  Only options for maintenance of NSR with low EF are Tikosyn and amiodarone.  Tikosyn not ideal with compliance issues and amiodarone not ideal with young age.  I will have EP consult on him: perhaps atrial flutter ablation could decrease trigger to atrial fibrillation (appears he was in atrial flutter initially today).   - He thinks that atrial arrhythmias started today.  Technically, we could probably cardiovert him today.  However, he has history of PAF and has not been particularly compliant with coumadin (see prior INRs).  I think that I will put him on amiodarone for now for rate control and if he remains in atrial fibrillation on Monday can plan TEE-guided DCCV.  - Long-term, would consider transition to NOAC if he can get insurance coverage.  2. Chronic systolic CHF: Nonischemic cardiomyopathy, EF 20-25%.  He does not seem to be markedly volume overloaded.  Restart lisinopril, Lasix, spironolactone at home doses (has been out for 5 days).  Restart Coreg at 6.25 mg bid for now.  3. Diabetes: Blood glucose > 500.  Will consult [facility] for help with diabetes management.   Marca Ancona 07/05/2013 5:05 PM

## 2013-07-05 NOTE — ED Notes (Addendum)
MD at bedside. 

## 2013-07-05 NOTE — Discharge Summary (Deleted)
PATIENT DETAILS  Name: Johnathan Archer  Age: 43 y.o.  Sex: male  Date of Birth: 11/15/1970  Admit Date: 07/05/2013  PCP:Javier Gutierrez, MD   CHIEF COMPLAINT:  Chest pain and shortness of breath since this morning   HPI:  Johnathan Archer is a 43 y.o. male with a Past Medical History of possible atrial fibrillation on Coumadin, nonischemic cardiomyopathy, diabetes, hypertension, history of noncompliance who presents today with the above noted complaint. Per patient, around 4:00 this morning he woke up with chest tightness. He claims that it was around 7/10 at its worse, there was no radiation, no associated nausea vomiting but some associated dizziness. It lasted for approximately an hour. However it reoccurred as a result they shouldn't presented to the emergency room for further evaluation. In the ED he was found to have atrial fibrillation with rapid ventricular response. Cardiology was consulted, however since patient's blood sugar was more than 500, the hospitalist service was asked to admit for further evaluation and treatment. In the emergency room, patient received IV Cardizem. Cardiology currently recommending amiodarone. During my evaluation, patient heart rate was down to the 90s, he is awake alert. He no longer had any chest pain or shortness of breath.   ALLERGIES:  No Known Allergies   PAST MEDICAL HISTORY:  Past Medical History   Diagnosis  Date   .  Hypertension    .  Type II or unspecified type diabetes mellitus without mention of complication, uncontrolled    .  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 cath 2004 negative for obstruction, last echo 07/2011 EF 35%. b) Normal coronary arteries by cath in 2004 (when EF was noted at 15%). c) Last EF 20% by echo 12/2012   .  History of chicken pox    .  OSA (obstructive sleep apnea)    .  Mitral regurgitation      Moderate by echo 07/2011   .   ED (erectile dysfunction)    .  Atrial fibrillation with RVR  10/2011     lone episode, converted with IV dilt, on coumadin given elevated CHADS2   .  Warfarin anticoagulation     PAST SURGICAL HISTORY:  Past Surgical History   Procedure  Laterality  Date   .  Hernia repair   2002   .  Knee surgery       L knee in HS   .  Cardiac catheterization   2004     no blockages   .  Us echocardiography   07/2011     mod LVH, EF 35%, diffuse hypokinesis of entire myocardium, irreversible restrictive pattern (grade 4 diastolic dysfunction), mod MR, mod dilated LA/RA, decreased RV systolic fxn, no PFO   .  Us echocardiography   12/2012     severe LVH, EF 20-25%, mod MR, reduced RV systolic function     MEDICATIONS AT HOME:  Prior to Admission medications   Medication  Sig  Start Date  End Date  Taking?  Authorizing Provider   carvedilol (COREG) 25 MG tablet  Take 1 tablet (25 mg total) by mouth 2 (two) times daily.  02/04/13   Yes  Lori C Gerhardt, NP   furosemide (LASIX) 40 MG tablet  Take 40 mg by mouth 2 (two) times daily.    Yes  Historical Provider, MD   glipiZIDE (GLUCOTROL) 10 MG tablet  Take 1 tablet (10   mg total) by mouth 2 (two) times daily before a meal.  12/17/12   Yes  Ankit Nanavati, MD   lisinopril (PRINIVIL,ZESTRIL) 10 MG tablet  Take 1 tablet (10 mg total) by mouth daily.  02/04/13   Yes  Lori C Gerhardt, NP   spironolactone (ALDACTONE) 25 MG tablet  Take 1 tablet (25 mg total) by mouth daily.  02/26/13   Yes  Lori C Gerhardt, NP   warfarin (COUMADIN) 5 MG tablet  Take 10 mg by mouth daily.    Yes  Historical Provider, MD    FAMILY HISTORY:  Family History   Problem  Relation  Age of Onset   .  Diabetes  Mother    .  Hypertension  Father    .  Stroke  Neg Hx    .  Coronary artery disease  Neg Hx    .  Cancer  Neg Hx     SOCIAL HISTORY:  reports that he has never smoked. He has never used smokeless tobacco. He reports that he drinks alcohol. He reports that he does not use illicit  drugs.   REVIEW OF SYSTEMS:  Constitutional:  No weight loss, night sweats, Fevers, chills, fatigue.   HEENT:   No headaches, Difficulty swallowing,Tooth/dental problems,Sore throat,  No sneezing, itching, ear ache, nasal congestion, post nasal drip,   Cardio-vascular:  No swelling in lower extremities, anasarca, dizziness, palpitations   GI:  No heartburn, indigestion, abdominal pain, nausea, vomiting, diarrhea, change in bowel habits, loss of appetite   Resp:  No excess mucus, no productive cough, No non-productive cough, No coughing up of blood.No change in color of mucus.No wheezing.No chest wall deformity   Skin:  no rash or lesions.   GU:  no dysuria, change in color of urine, no urgency or frequency. No flank pain.   Musculoskeletal:  No joint pain or swelling. No decreased range of motion. No back pain.   Psych:  No change in mood or affect. No depression or anxiety. No memory loss.   PHYSICAL EXAM:  Blood pressure 117/81, pulse 95, temperature 97.6 F (36.4 C), temperature source Oral, resp. rate 15, height 5' 11" (1.803 m), weight 102.059 kg (225 lb), SpO2 99.00%.  General appearance :Awake, alert, not in any distress. Speech Clear. Not toxic Looking  HEENT: Atraumatic and Normocephalic, pupils equally reactive to light and accomodation  Neck: supple, no JVD. No cervical lymphadenopathy.  Chest:Good air entry bilaterally, no added sounds  CVS: S1 S2 regular, no murmurs.  Abdomen: Bowel sounds present, Non tender and not distended with no gaurding, rigidity or rebound.  Extremities: B/L Lower Ext shows no edema, both legs are warm to touch  Neurology: Awake alert, and oriented X 3, CN II-XII intact, Non focal  Skin:No Rash  Wounds:[name]   LABS ON ADMISSION:   Recent Labs   07/05/13 1338   NA  133*   K  4.3   CL  93*   CO2  22   GLUCOSE  542*   BUN  24*   CREATININE  1.22   CALCIUM  9.0   MG  2.0    No results found for this basename: AST, ALT,  ALKPHOS, BILITOT, PROT, ALBUMIN, in the last 72 hours  No results found for this basename: LIPASE, AMYLASE, in the last 72 hours   Recent Labs   07/05/13 1338   WBC  5.5   NEUTROABS  3.3   HGB  15.2   HCT  41.7     MCV  85.1   PLT  257    No results found for this basename: CKTOTAL, CKMB, CKMBINDEX, TROPONINI, in the last 72 hours  No results found for this basename: DDIMER, in the last 72 hours  No components found with this basename: POCBNP,   RADIOLOGIC STUDIES ON ADMISSION:  Dg Chest Port 1 View  07/05/2013 CLINICAL DATA: Chest pressure. Dizziness. EXAM: PORTABLE CHEST - 1 VIEW COMPARISON: 12/17/2012 FINDINGS: Artifact and support devices overlie the chest. The heart is mildly enlarged. Mediastinal shadows are normal. The lungs are clear. Vascularity is normal. No effusions. IMPRESSION: No active disease. Extensive overlying artifactual density. Electronically Signed By: Mark Shogry M.D. On: 07/05/2013 14:07   EKG: Independently reviewed. Atrial fibrillation   ASSESSMENT AND PLAN:  Present on Admission:  . Atrial fibrillation with rapid ventricular response  - Cardiology consulted, current recommendations are continue with IV amiodarone, await INR is subtherapeutic to start IV heparin. Would defer further management to cardiology. Monitor in telemetry and cycle cardiac enzymes.   . Dilated idiopathic cardiomyopathy  - Currently compensated.  - On Lasix- which will be continued  - Monitor closely, check daily weights and strict intake output   . Hypertension  - Currently stable, continue preadmission medications   . Diabetic hyperosmolar non-ketotic state  - Start insulin for glucose nebulizer protocol, once CBGs and range, we'll transition to subcutaneous levemir with sliding scale coverage  - Check A1c  Further plan will depend as patient's clinical course evolves and further radiologic and laboratory data become available. Patient will be monitored closely.  Above noted  plan was discussed with patien, he was in agreement.   DVT Prophylaxis:  On Coumadin   Code Status:  Full Code   Total time spent for admission equals 45 minutes.  GHIMIRE,SHANKER  [facility]  Pager [phone]   If 7PM-7AM, please contact night-coverage  [email or web address]  Password [email or web address]  07/05/2013, 5:13 PM  

## 2013-07-05 NOTE — Progress Notes (Signed)
ANTICOAGULATION CONSULT NOTE - Initial Consult  Pharmacy Consult for heparin >> warfarin Indication: atrial fibrillation  No Known Allergies  Patient Measurements: Height: 5\' 11"  (180.3 cm) Weight: 225 lb (102.059 kg) IBW/kg (Calculated) : 75.3 Heparin Dosing Weight: 96kg  Vital Signs: Temp: 97.6 F (36.4 C) (01/16 1255) Temp src: Oral (01/16 1255) BP: 132/95 mmHg (01/16 1630) Pulse Rate: 47 (01/16 1630)  Labs:  Recent Labs  07/05/13 1338  HGB 15.2  HCT 41.7  PLT 257  CREATININE 1.22    Estimated Creatinine Clearance: 95 ml/min (by C-G formula based on Cr of 1.22).   Medical History: Past Medical History  Diagnosis Date  . Hypertension   . Type II or unspecified type diabetes mellitus without mention of complication, uncontrolled   . 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  cath 2004 negative for obstruction, last echo 07/2011 EF 35%. b) Normal coronary arteries by cath in 2004 (when EF was noted at 15%). c) Last EF 20% by echo 12/2012  . History of chicken pox   . OSA (obstructive sleep apnea)   . Mitral regurgitation     Moderate by echo 07/2011  . ED (erectile dysfunction)   . Atrial fibrillation with RVR 10/2011    lone episode, converted with IV dilt, on coumadin given elevated CHADS2  . Warfarin anticoagulation     Medications:  Warfarin 10mg  daily  Assessment: 43 year old male with afib on chronic coumadin; presents to [facility] with chest tightness and diaphoresis this morning. Patient has history of noncompliance with medications but states that he did take his warfarin this morning. INR subtherapeutic at 1.7, orders to bridge with IV heparin if INR low and continue coumadin.  Goal of Therapy:  INR 2-3 Heparin level 0.3-0.7 units/ml Monitor platelets by anticoagulation protocol: Yes   Plan:  Give 2000 units bolus x 1 - small bolus given elevated INR Start heparin infusion at 1300  units/hr Check anti-Xa level in 6 hours and daily while on heparin Continue to monitor H&H and platelets Give extra 2.5mg  of warfarin tonight (10mg  taken this morning at home) - watch INR with new start Gareth Eagle 07/05/2013,5:01 PM

## 2013-07-05 NOTE — ED Provider Notes (Signed)
TIME SEEN: 1:00 PM  CHIEF COMPLAINT: Chest pain, shortness of breath, palpitations  HPI: Patient is a 43 y.o. male with a history of hypertension, diabetes, CHF with an ejection fraction of 35%, mitral regurg, atrial fibrillation on Coumadin who presents the emergency department with chest pain and shortness of breath that started at 4 AM. Patient is also had lightheadedness and palpitations. In the emergency department, patient heart rate is 154 and appears to be regular. His blood sugars also 517. He is not insulin-dependent. He denies any fevers, cough, vomiting or diarrhea, bloody stool or melena.  ROS: See HPI Constitutional: no fever  Eyes: no drainage  ENT: no runny nose   Cardiovascular:   chest pain  Resp: SOB  GI: no vomiting GU: no dysuria Integumentary: no rash  Allergy: no hives  Musculoskeletal: no leg swelling  Neurological: no slurred speech ROS otherwise negative  PAST MEDICAL HISTORY/PAST SURGICAL HISTORY:  Past Medical History  Diagnosis Date  . Hypertension   . Type II or unspecified type diabetes mellitus without mention of complication, uncontrolled   . 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  cath 2004 negative for obstruction, last echo 07/2011 EF 35%. b) Normal coronary arteries by cath in 2004 (when EF was noted at 15%). c) Last EF 35% by echo 08/16/11  . History of chicken pox   . OSA (obstructive sleep apnea)   . History of pneumonia     2006 LLL PNA, 06/2011 RLL PNA  . Mitral regurgitation     Moderate by echo 07/2011  . ED (erectile dysfunction)   . Atrial fibrillation with RVR 10/2011    lone episode, converted with IV dilt, on coumadin given elevated CHADS2  . Warfarin anticoagulation     MEDICATIONS:  Prior to Admission medications   Medication Sig Start Date End Date Taking? Authorizing Provider  Blood Glucose Monitoring Suppl (CONTOUR BLOOD GLUCOSE SYSTEM) W/DEVICE KIT by  Does not apply route as directed.    Historical Provider, MD  carvedilol (COREG) 25 MG tablet Take 1 tablet (25 mg total) by mouth 2 (two) times daily. 02/04/13   Burtis Junes, NP  furosemide (LASIX) 40 MG tablet TAKE ONE TABLET BY MOUTH TWICE DAILY (PT  NEEDS  TO  CALL  OUR  OFFICE  TO  SCHEDULE  A  FOLLOW  UP  APPOINTMENT 06/18/13   Thayer Headings, MD  glipiZIDE (GLUCOTROL) 10 MG tablet Take 1 tablet (10 mg total) by mouth 2 (two) times daily before a meal. 12/17/12   Varney Biles, MD  Lancets MISC by Does not apply route as needed. Contour    Historical Provider, MD  lisinopril (PRINIVIL,ZESTRIL) 10 MG tablet Take 1 tablet (10 mg total) by mouth daily. 02/04/13   Burtis Junes, NP  spironolactone (ALDACTONE) 25 MG tablet Take 1 tablet (25 mg total) by mouth daily. 02/26/13   Burtis Junes, NP  warfarin (COUMADIN) 5 MG tablet Take as directed by anticoagulation clinic 06/17/13   Thayer Headings, MD    ALLERGIES:  No Known Allergies  SOCIAL HISTORY:  History  Substance Use Topics  . Smoking status: Never Smoker   . Smokeless tobacco: Never Used  . Alcohol Use: Yes     Comment: Occasional 1x/mo    FAMILY HISTORY: Family History  Problem Relation Age of Onset  . Diabetes Mother   . Hypertension Father   . Stroke Neg Hx   .  Coronary artery disease Neg Hx   . Cancer Neg Hx     EXAM: BP 103/63  Pulse 154  Temp(Src) 97.6 F (36.4 C) (Oral)  Resp 24  Ht _0  (1.803 m)  Wt 225 lb (102.059 kg)  BMI 31.39 kg/m2  SpO2 100% CONSTITUTIONAL: Alert and oriented and responds appropriately to questions. Well-appearing; well-nourished HEAD: Normocephalic EYES: Conjunctivae clear, PERRL ENT: normal nose; no rhinorrhea; moist mucous membranes; pharynx without lesions noted NECK: Supple, no meningismus, no LAD  CARD: Regular, tachycardic; S1 and S2 appreciated; no murmurs, no clicks, no rubs, no gallops RESP: Normal chest excursion without splinting or tachypnea; breath sounds  clear and equal bilaterally; no wheezes, no rhonchi, no rales,  ABD/GI: Normal bowel sounds; non-distended; soft, non-tender, no rebound, no guarding BACK:  The back appears normal and is non-tender to palpation, there is no CVA tenderness EXT: Normal ROM in all joints; non-tender to palpation; no edema; normal capillary refill; no cyanosis    SKIN: Normal color for age and race; warm NEURO: Moves all extremities equally PSYCH: The patient's mood and manner are appropriate. Grooming and personal hygiene are appropriate.  MEDICAL DECISION MAKING: Patient here with chest pain, shortness of breath and tachycardia. His EKG shows what appears to be a regular tachycardia with a rate that is steady at 154. Given 6 mg of adenosine for possible SVT after vagal maneuvers were unsuccessful.  Patient had multiple P waves and appeared to be in a flutter versus atrial fibrillation. Patient was normotensive but slightly low blood pressures. He is receiving IV fluids. We'll give diltiazem. Will check cardiac labs. His PCP is with .  His cardiologist is Dr. Katharina Caper.  ED PROGRESS: Patient's heart rate has improved to the 100s to 120s after diltiazem bolus and drip. Labs pending.    CRITICAL CARE Performed by: Nyra Jabs   Total critical care time: 30 minutes  Critical care time was exclusive of separately billable procedures and treating other patients.  Critical care was necessary to treat or prevent imminent or life-threatening deterioration.  Critical care was time spent personally by me on the following activities: development of treatment plan with patient and/or surrogate as well as nursing, discussions with consultants, evaluation of patient's response to treatment, examination of patient, obtaining history from patient or surrogate, ordering and performing treatments and interventions, ordering and review of laboratory studies, ordering and review of radiographic studies, pulse oximetry and  re-evaluation of patient's condition.  3:00 PM  Pt's heart rate is now in the 90s to 100s. He reports his chest pain and shortness of breath have improved. His blood pressure is now in the 120s/70s. Will continue to closely monitor. Have discussed with Trish with cardiology to evaluate patient in the emergency department. Patient is currently still on diltiazem drip. Troponin negative. Chest x-ray clear.  4:47 PM  Cards to admit. They have started amiodarone drip.   CRITICAL CARE Performed by: Nyra Jabs   Total critical care time: 30 mintues  Critical care time was exclusive of separately billable procedures and treating other patients.   Slightly low blood pressure, A. fib with RVR, multiple medications given including adenosine, diltiazem, amiodarone.  Critical care was necessary to treat or prevent imminent or life-threatening deterioration.  Critical care was time spent personally by me on the following activities: development of treatment plan with patient and/or surrogate as well as nursing, discussions with consultants, evaluation of patient's response to treatment, examination of patient, obtaining history from patient or  surrogate, ordering and performing treatments and interventions, ordering and review of laboratory studies, ordering and review of radiographic studies, pulse oximetry and re-evaluation of patient's condition.    EKG Interpretation    Date/Time:  Friday July 05 2013 12:40:57 EST Ventricular Rate:  156 PR Interval:  124 QRS Duration: 106 QT Interval:  310 QTC Calculation: 499 R Axis:   57 Text Interpretation:  Atrial fibrillation with RVR Minimal voltage criteria for LVH, may be normal variant T wave abnormality, consider inferolateral ischemia Abnormal ECG Confirmed by WARD  DO, KRISTEN ([id number]) on 07/05/2013 1:28:17 PM             [place], DO 07/05/13 1647

## 2013-07-06 DIAGNOSIS — I428 Other cardiomyopathies: Secondary | ICD-10-CM

## 2013-07-06 DIAGNOSIS — I10 Essential (primary) hypertension: Secondary | ICD-10-CM

## 2013-07-06 DIAGNOSIS — I5022 Chronic systolic (congestive) heart failure: Secondary | ICD-10-CM

## 2013-07-06 DIAGNOSIS — I509 Heart failure, unspecified: Secondary | ICD-10-CM

## 2013-07-06 DIAGNOSIS — I4891 Unspecified atrial fibrillation: Secondary | ICD-10-CM

## 2013-07-06 LAB — GLUCOSE, CAPILLARY
GLUCOSE-CAPILLARY: 170 mg/dL — AB (ref 70–99)
GLUCOSE-CAPILLARY: 140 mg/dL — AB (ref 70–99)
Glucose-Capillary: 138 mg/dL — ABNORMAL HIGH (ref 70–99)
Glucose-Capillary: 187 mg/dL — ABNORMAL HIGH (ref 70–99)
Glucose-Capillary: 202 mg/dL — ABNORMAL HIGH (ref 70–99)
Glucose-Capillary: 276 mg/dL — ABNORMAL HIGH (ref 70–99)
Glucose-Capillary: 300 mg/dL — ABNORMAL HIGH (ref 70–99)
Glucose-Capillary: 443 mg/dL — ABNORMAL HIGH (ref 70–99)
Glucose-Capillary: 206 mg/dL — ABNORMAL HIGH (ref 70–99)
Glucose-Capillary: 107 mg/dL — ABNORMAL HIGH (ref 70–99)
Glucose-Capillary: 151 mg/dL — ABNORMAL HIGH (ref 70–99)
Glucose-Capillary: 177 mg/dL — ABNORMAL HIGH (ref 70–99)

## 2013-07-06 LAB — BASIC METABOLIC PANEL
BUN: 23 mg/dL (ref 6–23)
CHLORIDE: 99 meq/L (ref 96–112)
CO2: 27 mEq/L (ref 19–32)
Calcium: 8.2 mg/dL — ABNORMAL LOW (ref 8.4–10.5)
Creatinine, Ser: 1.13 mg/dL (ref 0.50–1.35)
GFR calc Af Amer: >90 mL/min (ref >90)
GFR calc non Af Amer: 78 mL/min — ABNORMAL LOW (ref >90)
Glucose, Bld: 156 mg/dL — ABNORMAL HIGH (ref 70–99)
POTASSIUM: 3.2 meq/L — AB (ref 3.7–5.3)
Sodium: 138 mEq/L (ref 137–147)

## 2013-07-06 LAB — HEPARIN LEVEL (UNFRACTIONATED)
HEPARIN UNFRACTIONATED: 0.29 [IU]/mL — AB (ref 0.30–0.70)
HEPARIN UNFRACTIONATED: 0.28 [IU]/mL — AB (ref 0.30–0.70)
Heparin Unfractionated: 0.37 IU/mL (ref 0.30–0.70)

## 2013-07-06 LAB — URINALYSIS, ROUTINE W REFLEX MICROSCOPIC
Bilirubin Urine: NEGATIVE
Glucose, UA: >1000 mg/dL — AB
Hgb urine dipstick: NEGATIVE
KETONES UR: NEGATIVE mg/dL
Leukocytes, UA: NEGATIVE
NITRITE: NEGATIVE
PROTEIN: NEGATIVE mg/dL
Specific Gravity, Urine: 1.027 (ref 1.005–1.030)
Urobilinogen, UA: 0.2 mg/dL (ref 0.0–1.0)
pH: 5 (ref 5.0–8.0)

## 2013-07-06 LAB — URINE MICROSCOPIC-ADD ON

## 2013-07-06 LAB — CBC
HEMATOCRIT: 36.6 % — AB (ref 39.0–52.0)
HEMOGLOBIN: 12.9 g/dL — AB (ref 13.0–17.0)
MCH: 30.9 pg (ref 26.0–34.0)
MCHC: 35.2 g/dL (ref 30.0–36.0)
MCV: 87.8 fL (ref 78.0–100.0)
Platelets: 222 10*3/uL (ref 150–400)
RBC: 4.17 MIL/uL — ABNORMAL LOW (ref 4.22–5.81)
RDW: 12.4 % (ref 11.5–15.5)
WBC: 5.2 10*3/uL (ref 4.0–10.5)

## 2013-07-06 LAB — TSH: TSH: 0.462 u[IU]/mL (ref 0.350–4.500)

## 2013-07-06 LAB — TROPONIN I

## 2013-07-06 LAB — HEMOGLOBIN A1C
HEMOGLOBIN A1C: 13.7 % — AB (ref <5.7)
MEAN PLASMA GLUCOSE: 346 mg/dL — AB (ref <117)

## 2013-07-06 LAB — PROTIME-INR
INR: 2.02 — AB (ref 0.00–1.49)
PROTHROMBIN TIME: 22.2 s — AB (ref 11.6–15.2)

## 2013-07-06 MED ORDER — WARFARIN SODIUM 10 MG PO TABS
10.0000 mg | ORAL_TABLET | Freq: Once | ORAL | Status: AC
  Administered 2013-07-06: 10 mg via ORAL
  Filled 2013-07-06: qty 1

## 2013-07-06 MED ORDER — DEXTROSE-NACL 5-0.45 % IV SOLN
INTRAVENOUS | Status: DC
  Administered 2013-07-06: 08:00:00 via INTRAVENOUS

## 2013-07-06 MED ORDER — INSULIN ASPART 100 UNIT/ML ~~LOC~~ SOLN
0.0000 [IU] | Freq: Three times a day (TID) | SUBCUTANEOUS | Status: DC
Start: 2013-07-07 — End: 2013-07-07
  Administered 2013-07-07: 8 [IU] via SUBCUTANEOUS

## 2013-07-06 MED ORDER — LIVING WELL WITH DIABETES BOOK
Freq: Once | Status: AC
  Administered 2013-07-06: 1
  Filled 2013-07-06: qty 1

## 2013-07-06 MED ORDER — INSULIN ASPART 100 UNIT/ML ~~LOC~~ SOLN
0.0000 [IU] | Freq: Three times a day (TID) | SUBCUTANEOUS | Status: DC
  Administered 2013-07-06: 3 [IU] via SUBCUTANEOUS

## 2013-07-06 MED ORDER — POTASSIUM CHLORIDE CRYS ER 20 MEQ PO TBCR
20.0000 meq | EXTENDED_RELEASE_TABLET | ORAL | Status: AC
  Administered 2013-07-06 (×2): 20 meq via ORAL
  Filled 2013-07-06 (×2): qty 1

## 2013-07-06 MED ORDER — INSULIN DETEMIR 100 UNIT/ML ~~LOC~~ SOLN
40.0000 [IU] | Freq: Every day | SUBCUTANEOUS | Status: DC
  Administered 2013-07-06 – 2013-07-07 (×2): 40 [IU] via SUBCUTANEOUS
  Filled 2013-07-06 (×2): qty 0.4

## 2013-07-06 MED ORDER — POTASSIUM CHLORIDE CRYS ER 20 MEQ PO TBCR
40.0000 meq | EXTENDED_RELEASE_TABLET | Freq: Once | ORAL | Status: AC
  Administered 2013-07-06: 40 meq via ORAL
  Filled 2013-07-06: qty 2

## 2013-07-06 MED ORDER — AMIODARONE HCL 200 MG PO TABS
400.0000 mg | ORAL_TABLET | Freq: Two times a day (BID) | ORAL | Status: DC
  Administered 2013-07-06 (×2): 400 mg via ORAL
  Filled 2013-07-06 (×4): qty 2

## 2013-07-06 MED ORDER — INFLUENZA VAC SPLIT QUAD 0.5 ML IM SUSP
0.5000 mL | INTRAMUSCULAR | Status: DC
  Filled 2013-07-06: qty 0.5

## 2013-07-06 MED ORDER — INSULIN ASPART 100 UNIT/ML ~~LOC~~ SOLN
6.0000 [IU] | Freq: Three times a day (TID) | SUBCUTANEOUS | Status: DC
  Administered 2013-07-06 – 2013-07-07 (×3): 6 [IU] via SUBCUTANEOUS

## 2013-07-06 NOTE — Progress Notes (Signed)
ANTICOAGULATION CONSULT NOTE - Follow-up Consult  Pharmacy Consult for heparin >> warfarin Indication: atrial fibrillation  No Known Allergies  Patient Measurements: Height: 5\' 11"  (180.3 cm) Weight: 222 lb 3.6 oz (100.8 kg) IBW/kg (Calculated) : 75.3 Heparin Dosing Weight: 96kg  Vital Signs: Temp: 97.4 F (36.3 C) (01/17 0800) Temp src: Axillary (01/17 0800) BP: 142/89 mmHg (01/17 0800) Pulse Rate: 70 (01/17 0800)  Labs:  Recent Labs  07/05/13 1338 07/05/13 1750 07/05/13 1822 07/05/13 2150 07/06/13 0325 07/06/13 0328 07/06/13 0333 07/06/13 0835 07/06/13 0838  HGB 15.2  --   --   --   --  12.9*  --   --   --   HCT 41.7  --   --   --   --  36.6*  --   --   --   PLT 257  --   --   --   --  222  --   --   --   LABPROT  --   --  19.5*  --   --  22.2*  --   --   --   INR  --   --  1.70*  --   --  2.02*  --   --   --   HEPARINUNFRC  --   --   --   --   --  0.37  --   --  0.29*  CREATININE 1.22 1.14  --  1.31 1.13  --   --   --   --   TROPONINI  --  <0.30  --   --   --   --  <0.30 <0.30  --     Estimated Creatinine Clearance: 101.9 ml/min (by C-G formula based on Cr of 1.13).   Medications:  Warfarin 10mg  daily  Assessment: 43 year old male with afib on chronic coumadin; presents to [facility] with chest tightness and diaphoresis. Patient has history of noncompliance with medications but states that he did take his warfarin on 1/16. INR just therapeutic at 2.02 and heparin level just subtherapeutic at 0.29. Will continue heparin bridge for one more day since INR barely therapeutic and questionable adherence (may drop tomorrow if patient has missed doses this week). No signs or symptoms of bleeding noted.   Goal of Therapy:  INR 2-3 Heparin level 0.3-0.7 units/ml Monitor platelets by anticoagulation protocol: Yes   Plan:  Increase heparin infusion to 1500 units/hr - no bolus since almost therapeutic and INR up Check anti-Xa level in 6 hours  Continue to monitor H&H and  platelets Warfarin 10mg  tonight - watch INR with new start amio  Thank you, Piedad Climes, PharmD Clinical Pharmacist - Resident Pager: [phone] Pharmacy: [phone] 07/06/2013 11:28 AM

## 2013-07-06 NOTE — Progress Notes (Signed)
ANTICOAGULATION CONSULT NOTE - Follow Up Consult  Pharmacy Consult for heparin Indication: atrial fibrillation  Labs:  Recent Labs  07/05/13 1338 07/05/13 1750 07/05/13 1822 07/05/13 2150 07/06/13 0325 07/06/13 0328 07/06/13 0333  HGB 15.2  --   --   --   --  12.9*  --   HCT 41.7  --   --   --   --  36.6*  --   PLT 257  --   --   --   --  222  --   LABPROT  --   --  19.5*  --   --  22.2*  --   INR  --   --  1.70*  --   --  2.02*  --   HEPARINUNFRC  --   --   --   --   --  0.37  --   CREATININE 1.22 1.14  --  1.31 1.13  --   --   TROPONINI  --  <0.30  --   --   --   --  <0.30    Assessment/Plan:  43yo male therapeutic on heparin with initial dosing for low INR, INR now also slightly above 2.  Will continue gtt at current rate and confirm stable with additional level and f/u whether heparin still necessary.  Vernard Gambles, PharmD, BCPS  07/06/2013,5:01 AM

## 2013-07-06 NOTE — Progress Notes (Signed)
ANTICOAGULATION CONSULT NOTE - Follow-up Consult  Pharmacy Consult for heparin Indication: atrial fibrillation  No Known Allergies  Patient Measurements: Height: 5\' 11"  (180.3 cm) Weight: 222 lb 3.6 oz (100.8 kg) IBW/kg (Calculated) : 75.3 Heparin Dosing Weight: 96kg  Vital Signs: Temp: 97.6 F (36.4 C) (01/17 1600) Temp src: Oral (01/17 1600) BP: 127/87 mmHg (01/17 1800) Pulse Rate: 70 (01/17 0800)  Labs:  Recent Labs  07/05/13 1338 07/05/13 1750 07/05/13 1822 07/05/13 2150 07/06/13 0325 07/06/13 0328 07/06/13 0333 07/06/13 0835 07/06/13 0838 07/06/13 1730  HGB 15.2  --   --   --   --  12.9*  --   --   --   --   HCT 41.7  --   --   --   --  36.6*  --   --   --   --   PLT 257  --   --   --   --  222  --   --   --   --   LABPROT  --   --  19.5*  --   --  22.2*  --   --   --   --   INR  --   --  1.70*  --   --  2.02*  --   --   --   --   HEPARINUNFRC  --   --   --   --   --  0.37  --   --  0.29* 0.28*  CREATININE 1.22 1.14  --  1.31 1.13  --   --   --   --   --   TROPONINI  --  <0.30  --   --   --   --  <0.30 <0.30  --   --     Estimated Creatinine Clearance: 101.9 ml/min (by C-G formula based on Cr of 1.13).   Medications:  Warfarin 10mg  daily  Assessment: 43 y/o male on heparin to bridge warfarin for Afib. Heparin level is slightly below goal at 0.28 on 1500 units/hr. No bleeding noted.  Goal of Therapy:  Heparin level 0.3-0.7 units/ml Monitor platelets by anticoagulation protocol: Yes   Plan:  Increase heparin infusion to 1600 units/hr  Daily heparin level and CBC Monitor for s/sx bleeding Consider discontinuing heparin if INR >2 in am  [facility], [place].D., BCPS Clinical Pharmacist Pager: [phone] 07/06/2013 7:04 PM

## 2013-07-06 NOTE — Progress Notes (Signed)
Nutrition Education Note  Intervention: - Will order outpatient dietitian follow up at [facility] - Recommend MD add diabetic diet restriction to diet order, current diet order is heart healthy  RD consulted for nutrition education regarding diabetes.   Lab Results  Component Value Date   HGBA1C 13.7* 07/05/2013    RD provided "Carbohydrate Counting for People with Diabetes" handout from the [employer] of [employer]. Discussed different food groups and their effects on blood sugar, emphasizing carbohydrate-containing foods. Provided list of carbohydrates and recommended serving sizes of common foods.  Discussed importance of controlled and consistent carbohydrate intake throughout the day. Provided examples of ways to balance meals/snacks and encouraged intake of high-fiber, whole grain complex carbohydrates. Teach back method used.  Pt admits to drinking sweet tea and excess juice. Does not monitor his portion sizes of carbohydrate containing foods at home. Seems to be motivated to eat healthier.   Expect good compliance.  Body mass index is 31.01 kg/(m^2). Pt meets criteria for class I obesity based on current BMI.  Current diet order is heart healthy, patient is consuming approximately 50% of meals at this time. Labs and medications reviewed. No further nutrition interventions warranted at this time. RD contact information provided. If additional nutrition issues arise, please re-consult RD.  Levon Hedger MS, RD, LDN [phone] Weekend/After Hours Pager

## 2013-07-06 NOTE — Progress Notes (Signed)
SUBJECTIVE:  No chest pain.  No SOB   PHYSICAL EXAM Filed Vitals:   07/06/13 0353 07/06/13 0600 07/06/13 0800 07/06/13 1200  BP: 129/79 131/95 142/89 136/79  Pulse: 69 69 70   Temp: 98 F (36.7 C)  97.4 F (36.3 C) 97.7 F (36.5 C)  TempSrc: Axillary  Axillary Oral  Resp: 18 18 16 19   Height:      Weight: 222 lb 3.6 oz (100.8 kg)     SpO2: 98% 98% 99% 100%   General:  No distress Lungs:  Clear Heart:  RRR Abdomen:  Positive bowel sounds, no rebound no guarding Extremities:  No edema  LABS: Lab Results  Component Value Date   TROPONINI <0.30 07/06/2013   Results for orders placed during the hospital encounter of 07/05/13 (from the past 24 hour(s))  GLUCOSE, CAPILLARY     Status: Abnormal   Collection Time    07/05/13  3:45 PM      Result Value Range   Glucose-Capillary 338 (*) 70 - 99 mg/dL  BASIC METABOLIC PANEL     Status: Abnormal   Collection Time    07/05/13  5:50 PM      Result Value Range   Sodium 135 (*) 137 - 147 mEq/L   Potassium 3.9  3.7 - 5.3 mEq/L   Chloride 96  96 - 112 mEq/L   CO2 25  19 - 32 mEq/L   Glucose, Bld 418 (*) 70 - 99 mg/dL   BUN 22  6 - 23 mg/dL   Creatinine, Ser 1.61  0.50 - 1.35 mg/dL   Calcium 8.7  8.4 - 09.6 mg/dL   GFR calc non Af Amer 77 (*) >90 mL/min   GFR calc Af Amer 90 (*) >90 mL/min  TROPONIN I     Status: None   Collection Time    07/05/13  5:50 PM      Result Value Range   Troponin I <0.30  <0.30 ng/mL  HEMOGLOBIN A1C     Status: Abnormal   Collection Time    07/05/13  5:50 PM      Result Value Range   Hemoglobin A1C 13.7 (*) <5.7 %   Mean Plasma Glucose 346 (*) <117 mg/dL  TSH     Status: None   Collection Time    07/05/13  5:50 PM      Result Value Range   TSH 0.462  0.350 - 4.500 uIU/mL  MRSA PCR SCREENING     Status: None   Collection Time    07/05/13  5:58 PM      Result Value Range   MRSA by PCR NEGATIVE  NEGATIVE  PROTIME-INR     Status: Abnormal   Collection Time    07/05/13  6:22 PM   Result Value Range   Prothrombin Time 19.5 (*) 11.6 - 15.2 seconds   INR 1.70 (*) 0.00 - 1.49  GLUCOSE, CAPILLARY     Status: Abnormal   Collection Time    07/05/13  9:18 PM      Result Value Range   Glucose-Capillary 493 (*) 70 - 99 mg/dL  BASIC METABOLIC PANEL     Status: Abnormal   Collection Time    07/05/13  9:50 PM      Result Value Range   Sodium 135 (*) 137 - 147 mEq/L   Potassium 4.1  3.7 - 5.3 mEq/L   Chloride 94 (*) 96 - 112 mEq/L   CO2 26  19 - 32  mEq/L   Glucose, Bld 484 (*) 70 - 99 mg/dL   BUN 26 (*) 6 - 23 mg/dL   Creatinine, Ser 1.611.31  0.50 - 1.35 mg/dL   Calcium 8.4  8.4 - 09.610.5 mg/dL   GFR calc non Af Amer 65 (*) >90 mL/min   GFR calc Af Amer 76 (*) >90 mL/min  GLUCOSE, CAPILLARY     Status: Abnormal   Collection Time    07/05/13 10:34 PM      Result Value Range   Glucose-Capillary 443 (*) 70 - 99 mg/dL  GLUCOSE, CAPILLARY     Status: Abnormal   Collection Time    07/05/13 11:55 PM      Result Value Range   Glucose-Capillary 300 (*) 70 - 99 mg/dL  GLUCOSE, CAPILLARY     Status: Abnormal   Collection Time    07/06/13  1:12 AM      Result Value Range   Glucose-Capillary 276 (*) 70 - 99 mg/dL  GLUCOSE, CAPILLARY     Status: Abnormal   Collection Time    07/06/13  2:18 AM      Result Value Range   Glucose-Capillary 187 (*) 70 - 99 mg/dL  GLUCOSE, CAPILLARY     Status: Abnormal   Collection Time    07/06/13  3:22 AM      Result Value Range   Glucose-Capillary 170 (*) 70 - 99 mg/dL  BASIC METABOLIC PANEL     Status: Abnormal   Collection Time    07/06/13  3:25 AM      Result Value Range   Sodium 138  137 - 147 mEq/L   Potassium 3.2 (*) 3.7 - 5.3 mEq/L   Chloride 99  96 - 112 mEq/L   CO2 27  19 - 32 mEq/L   Glucose, Bld 156 (*) 70 - 99 mg/dL   BUN 23  6 - 23 mg/dL   Creatinine, Ser 0.451.13  0.50 - 1.35 mg/dL   Calcium 8.2 (*) 8.4 - 10.5 mg/dL   GFR calc non Af Amer 78 (*) >90 mL/min   GFR calc Af Amer >90  >90 mL/min  CBC     Status: Abnormal    Collection Time    07/06/13  3:28 AM      Result Value Range   WBC 5.2  4.0 - 10.5 K/uL   RBC 4.17 (*) 4.22 - 5.81 MIL/uL   Hemoglobin 12.9 (*) 13.0 - 17.0 g/dL   HCT 40.936.6 (*) 81.139.0 - 91.452.0 %   MCV 87.8  78.0 - 100.0 fL   MCH 30.9  26.0 - 34.0 pg   MCHC 35.2  30.0 - 36.0 g/dL   RDW 78.212.4  95.611.5 - 21.315.5 %   Platelets 222  150 - 400 K/uL  HEPARIN LEVEL (UNFRACTIONATED)     Status: None   Collection Time    07/06/13  3:28 AM      Result Value Range   Heparin Unfractionated 0.37  0.30 - 0.70 IU/mL  PROTIME-INR     Status: Abnormal   Collection Time    07/06/13  3:28 AM      Result Value Range   Prothrombin Time 22.2 (*) 11.6 - 15.2 seconds   INR 2.02 (*) 0.00 - 1.49  TROPONIN I     Status: None   Collection Time    07/06/13  3:33 AM      Result Value Range   Troponin I <0.30  <0.30 ng/mL  GLUCOSE, CAPILLARY  Status: Abnormal   Collection Time    07/06/13  4:20 AM      Result Value Range   Glucose-Capillary 140 (*) 70 - 99 mg/dL  GLUCOSE, CAPILLARY     Status: Abnormal   Collection Time    07/06/13  5:29 AM      Result Value Range   Glucose-Capillary 138 (*) 70 - 99 mg/dL  GLUCOSE, CAPILLARY     Status: Abnormal   Collection Time    07/06/13  6:41 AM      Result Value Range   Glucose-Capillary 202 (*) 70 - 99 mg/dL  GLUCOSE, CAPILLARY     Status: Abnormal   Collection Time    07/06/13  7:59 AM      Result Value Range   Glucose-Capillary 206 (*) 70 - 99 mg/dL  TROPONIN I     Status: None   Collection Time    07/06/13  8:35 AM      Result Value Range   Troponin I <0.30  <0.30 ng/mL  HEPARIN LEVEL (UNFRACTIONATED)     Status: Abnormal   Collection Time    07/06/13  8:38 AM      Result Value Range   Heparin Unfractionated 0.29 (*) 0.30 - 0.70 IU/mL  URINALYSIS, ROUTINE W REFLEX MICROSCOPIC     Status: Abnormal   Collection Time    07/06/13  9:47 AM      Result Value Range   Color, Urine YELLOW  YELLOW   APPearance CLEAR  CLEAR   Specific Gravity, Urine 1.027   1.005 - 1.030   pH 5.0  5.0 - 8.0   Glucose, UA >1000 (*) NEGATIVE mg/dL   Hgb urine dipstick NEGATIVE  NEGATIVE   Bilirubin Urine NEGATIVE  NEGATIVE   Ketones, ur NEGATIVE  NEGATIVE mg/dL   Protein, ur NEGATIVE  NEGATIVE mg/dL   Urobilinogen, UA 0.2  0.0 - 1.0 mg/dL   Nitrite NEGATIVE  NEGATIVE   Leukocytes, UA NEGATIVE  NEGATIVE  URINE MICROSCOPIC-ADD ON     Status: Abnormal   Collection Time    07/06/13  9:47 AM      Result Value Range   Squamous Epithelial / LPF MANY (*) RARE   WBC, UA 0-2  <3 WBC/hpf   RBC / HPF 0-2  <3 RBC/hpf   Bacteria, UA RARE  RARE  GLUCOSE, CAPILLARY     Status: Abnormal   Collection Time    07/06/13 12:58 PM      Result Value Range   Glucose-Capillary 107 (*) 70 - 99 mg/dL    Intake/Output Summary (Last 24 hours) at 07/06/13 1501 Last data filed at 07/06/13 0800  Gross per 24 hour  Intake 1469.17 ml  Output   2350 ml  Net -880.83 ml     ASSESSMENT AND PLAN:  ATRIAL FIB:  Now in NSR.  INR is therapeutic.  Changed to PO amiodarone.  OK to discharge in the AM.   CARDIOMYOPATHY:   Continue current therapy.    HTN:    This is being managed in the context of treating his CHF  Rollene Rotunda 07/06/2013 3:01 PM

## 2013-07-06 NOTE — Progress Notes (Signed)
RT Note: Pt has CPAP set-up at bedside. Pt states he does not need any further assistance from RT Will continue to monitor.

## 2013-07-06 NOTE — Progress Notes (Signed)
PATIENT DETAILS Name: Johnathan Archer Age: 43 y.o. Sex: male Date of Birth: Jul 20, 1970 Admit Date: 07/05/2013 Admitting Physician Dewayne Shorter Levora Dredge, MD OYD:XAJOIN Sharen Hones, MD  Subjective: No major issues overnight. Has converted to sinus rhythm. Have taken him off insulin infusion.  Assessment/Plan: Principal Problem:   Atrial fibrillation with rapid ventricular response - patient admitted and started on IV amiodarone infusion. INR was subtherapeutic, and started on IV heparin infusion along with Coumadin. - converted to sinus rhythm with IV amiodarone - Cardiology following, await further recommendation  Hyperosmolar nonketotic hyperglycemia - Secondary to noncompliance - Patient was admitted and started on IV insulin. He is now being transitioned to Levemir and NovoLog. - A1c significantly elevated at 13.7, he will need insulin on discharge. Begin diabetic education, begin insulin teaching. -Nutrition evaluation - Needs counseling regarding importance of compliance to medication   Chronic systolic heart failure - Clinically compensated, has underlying history of nonischemic cardiomyopathy. - Continue with Aldactone, lisinopril, Lasix and Coreg  Hypertension - Controlled, continue current medications  Disposition: Remain inpatient- home once of IV amiodarone-suspect 1/18 if okay with cardiology   DVT Prophylaxis:  not needed as an full dose anticoagulation with Coumadin   Code Status: Full code  Family Communication  none at bedside  Procedures:   none  CONSULTS:  cardiology  Time spent 40 minutes-which includes 50% of the time with face-to-face with patient/ family and coordinating care related to the above assessment and plan.    MEDICATIONS: Scheduled Meds: . carvedilol  6.25 mg Oral BID WC  . furosemide  40 mg Oral BID  . [START ON 07/07/2013] influenza vac split quadrivalent PF  0.5 mL Intramuscular Tomorrow-1000  . insulin aspart   0-15 Units Subcutaneous TID WC  . insulin aspart  6 Units Subcutaneous TID WC  . insulin detemir  40 Units Subcutaneous Daily  . lisinopril  10 mg Oral Daily  . potassium chloride  20 mEq Oral Q4H  . sodium chloride  3 mL Intravenous Q12H  . spironolactone  12.5 mg Oral Daily  . Warfarin - Pharmacist Dosing Inpatient   Does not apply q1800   Continuous Infusions: . amiodarone (NEXTERONE PREMIX) 360 mg/200 mL dextrose 30 mg/hr (07/05/13 2241)  . heparin 1,300 Units/hr (07/05/13 2100)   PRN Meds:.acetaminophen, acetaminophen, albuterol, dextrose, guaiFENesin-dextromethorphan, ondansetron (ZOFRAN) IV, ondansetron, sodium chloride  Antibiotics: Anti-infectives   None       PHYSICAL EXAM: Vital signs in last 24 hours: Filed Vitals:   07/06/13 0013 07/06/13 0353 07/06/13 0600 07/06/13 0800  BP: 115/58 129/79 131/95 142/89  Pulse: 78 69 69 70  Temp: 98.4 F (36.9 C) 98 F (36.7 C)  97.4 F (36.3 C)  TempSrc: Oral Axillary  Axillary  Resp: 20 18 18 16   Height:      Weight:  100.8 kg (222 lb 3.6 oz)    SpO2: 96% 98% 98% 99%    Weight change:  Filed Weights   07/05/13 1255 07/05/13 1740 07/06/13 0353  Weight: 102.059 kg (225 lb) 102.1 kg (225 lb 1.4 oz) 100.8 kg (222 lb 3.6 oz)   Body mass index is 31.01 kg/(m^2).   Gen Exam: Awake and alert with clear speech.   Neck: Supple, No JVD.   Chest: B/L Clear.   CVS: S1 S2 Regular, no murmurs.  Abdomen: soft, BS +, non tender, non distended.  Extremities: no edema, lower extremities warm to touch. Neurologic: Non Focal.   Skin: No Rash.   Wounds: [name].  Intake/Output from previous day:  Intake/Output Summary (Last 24 hours) at 07/06/13 1029 Last data filed at 07/06/13 0800  Gross per 24 hour  Intake 1469.17 ml  Output   2350 ml  Net -880.83 ml     LAB RESULTS: CBC  Recent Labs Lab 07/05/13 1338 07/06/13 0328  WBC 5.5 5.2  HGB 15.2 12.9*  HCT 41.7 36.6*  PLT 257 222  MCV 85.1 87.8  MCH 31.0 30.9  MCHC  36.5* 35.2  RDW 12.1 12.4  LYMPHSABS 1.6  --   MONOABS 0.4  --   EOSABS 0.1  --   BASOSABS 0.0  --     Chemistries   Recent Labs Lab 07/05/13 1338 07/05/13 1750 07/05/13 2150 07/06/13 0325  NA 133* 135* 135* 138  K 4.3 3.9 4.1 3.2*  CL 93* 96 94* 99  CO2 22 25 26 27   GLUCOSE 542* 418* 484* 156*  BUN 24* 22 26* 23  CREATININE 1.22 1.14 1.31 1.13  CALCIUM 9.0 8.7 8.4 8.2*  MG 2.0  --   --   --     CBG:  Recent Labs Lab 07/06/13 0322 07/06/13 0420 07/06/13 0529 07/06/13 0641 07/06/13 0759  GLUCAP 170* 140* 138* 202* 206*    GFR Estimated Creatinine Clearance: 101.9 ml/min (by C-G formula based on Cr of 1.13).  Coagulation profile  Recent Labs Lab 07/05/13 1822 07/06/13 0328  INR 1.70* 2.02*    Cardiac Enzymes  Recent Labs Lab 07/05/13 1750 07/06/13 0333 07/06/13 0835  TROPONINI <0.30 <0.30 <0.30    No components found with this basename: POCBNP,  No results found for this basename: DDIMER,  in the last 72 hours  Recent Labs  07/05/13 1750  HGBA1C 13.7*   No results found for this basename: CHOL, HDL, LDLCALC, TRIG, CHOLHDL, LDLDIRECT,  in the last 72 hours  Recent Labs  07/05/13 1750  TSH 0.462   No results found for this basename: VITAMINB12, FOLATE, FERRITIN, TIBC, IRON, RETICCTPCT,  in the last 72 hours No results found for this basename: LIPASE, AMYLASE,  in the last 72 hours  Urine Studies No results found for this basename: UACOL, UAPR, USPG, UPH, UTP, UGL, UKET, UBIL, UHGB, UNIT, UROB, ULEU, UEPI, UWBC, URBC, UBAC, CAST, CRYS, UCOM, BILUA,  in the last 72 hours  MICROBIOLOGY: Recent Results (from the past 240 hour(s))  MRSA PCR SCREENING     Status: None   Collection Time    07/05/13  5:58 PM      Result Value Range Status   MRSA by PCR NEGATIVE  NEGATIVE Final   Comment:            The GeneXpert MRSA Assay (FDA     approved for NASAL specimens     only), is one component of a     comprehensive MRSA colonization      surveillance program. It is not     intended to diagnose MRSA     infection nor to guide or     monitor treatment for     MRSA infections.    RADIOLOGY STUDIES/RESULTS: Dg Chest Port 1 View  07/05/2013   CLINICAL DATA:  Chest pressure.  Dizziness.  EXAM: PORTABLE CHEST - 1 VIEW  COMPARISON:  12/17/2012  FINDINGS: Artifact and support devices overlie the chest. The heart is mildly enlarged. Mediastinal shadows are normal. The lungs are clear. Vascularity is normal. No effusions.  IMPRESSION: No active disease.  Extensive overlying artifactual density.   Electronically Signed  By: Paulina Fusi M.D.   On: 07/05/2013 14:07    Jeoffrey Massed, MD  [facility] Pager:[phone]  If 7PM-7AM, please contact night-coverage [email or web address] Password [email or web address] 07/06/2013, 10:29 AM   LOS: 1 day

## 2013-07-07 ENCOUNTER — Telehealth: Payer: Self-pay | Admitting: Family Medicine

## 2013-07-07 LAB — BASIC METABOLIC PANEL
BUN: 16 mg/dL (ref 6–23)
CALCIUM: 8 mg/dL — AB (ref 8.4–10.5)
CO2: 25 mEq/L (ref 19–32)
CREATININE: 1 mg/dL (ref 0.50–1.35)
Chloride: 99 mEq/L (ref 96–112)
GFR calc Af Amer: >90 mL/min (ref >90)
GLUCOSE: 234 mg/dL — AB (ref 70–99)
Potassium: 3.6 mEq/L — ABNORMAL LOW (ref 3.7–5.3)
SODIUM: 136 meq/L — AB (ref 137–147)

## 2013-07-07 LAB — CBC
HCT: 35.5 % — ABNORMAL LOW (ref 39.0–52.0)
Hemoglobin: 12.4 g/dL — ABNORMAL LOW (ref 13.0–17.0)
MCH: 30.7 pg (ref 26.0–34.0)
MCHC: 34.9 g/dL (ref 30.0–36.0)
MCV: 87.9 fL (ref 78.0–100.0)
PLATELETS: 195 10*3/uL (ref 150–400)
RBC: 4.04 MIL/uL — ABNORMAL LOW (ref 4.22–5.81)
RDW: 12.5 % (ref 11.5–15.5)
WBC: 4.8 10*3/uL (ref 4.0–10.5)

## 2013-07-07 LAB — GLUCOSE, CAPILLARY: Glucose-Capillary: 269 mg/dL — ABNORMAL HIGH (ref 70–99)

## 2013-07-07 LAB — HEPARIN LEVEL (UNFRACTIONATED): HEPARIN UNFRACTIONATED: 0.48 [IU]/mL (ref 0.30–0.70)

## 2013-07-07 LAB — PROTIME-INR
INR: 1.92 — ABNORMAL HIGH (ref 0.00–1.49)
Prothrombin Time: 21.4 seconds — ABNORMAL HIGH (ref 11.6–15.2)

## 2013-07-07 MED ORDER — INSULIN DETEMIR 100 UNIT/ML FLEXPEN: 40.0000 [IU] | PEN_INJECTOR | Freq: Every day | SUBCUTANEOUS | Status: DC

## 2013-07-07 MED ORDER — AMIODARONE HCL 200 MG PO TABS
200.0000 mg | ORAL_TABLET | Freq: Two times a day (BID) | ORAL | Status: DC
  Administered 2013-07-07: 200 mg via ORAL
  Filled 2013-07-07 (×2): qty 1

## 2013-07-07 MED ORDER — WARFARIN SODIUM 5 MG PO TABS: 10.0000 mg | ORAL_TABLET | Freq: Every day | ORAL | Status: DC

## 2013-07-07 MED ORDER — CARVEDILOL 6.25 MG PO TABS: 25.0000 mg | ORAL_TABLET | Freq: Two times a day (BID) | ORAL | Status: DC

## 2013-07-07 MED ORDER — POTASSIUM CHLORIDE ER 20 MEQ PO TBCR: 40.0000 meq | EXTENDED_RELEASE_TABLET | Freq: Every day | ORAL | Status: DC

## 2013-07-07 MED ORDER — PEN NEEDLES 31G X 6 MM MISC: Status: DC

## 2013-07-07 MED ORDER — AMIODARONE HCL 200 MG PO TABS: 200.0000 mg | ORAL_TABLET | Freq: Two times a day (BID) | ORAL | Status: DC

## 2013-07-07 MED ORDER — GLIPIZIDE 10 MG PO TABS: 10.0000 mg | ORAL_TABLET | Freq: Two times a day (BID) | ORAL | Status: DC

## 2013-07-07 MED ORDER — SPIRONOLACTONE 25 MG PO TABS: 25.0000 mg | ORAL_TABLET | Freq: Every day | ORAL | Status: DC

## 2013-07-07 MED ORDER — FUROSEMIDE 40 MG PO TABS: 40.0000 mg | ORAL_TABLET | Freq: Two times a day (BID) | ORAL | Status: DC

## 2013-07-07 MED ORDER — METFORMIN HCL 500 MG PO TABS: 500.0000 mg | ORAL_TABLET | Freq: Two times a day (BID) | ORAL | Status: DC

## 2013-07-07 MED ORDER — LISINOPRIL 10 MG PO TABS: 10.0000 mg | ORAL_TABLET | Freq: Every day | ORAL | Status: DC

## 2013-07-07 NOTE — Progress Notes (Signed)
SUBJECTIVE:  No chest pain.  No SOB   PHYSICAL EXAM Filed Vitals:   07/06/13 1800 07/06/13 2356 07/07/13 0321 07/07/13 0824  BP: 127/87 142/80 139/73 153/83  Pulse:  71 63 70  Temp:  98 F (36.7 C) 97.1 F (36.2 C) 98.1 F (36.7 C)  TempSrc:  Axillary Axillary Oral  Resp: 20 18 14 17   Height:      Weight:   225 lb 13.4 oz (102.44 kg)   SpO2:  98% 97% 98%   General:  No distress Lungs:  Clear Heart:  RRR Abdomen:  Positive bowel sounds, no rebound no guarding Extremities:  No edema  LABS: Lab Results  Component Value Date   TROPONINI <0.30 07/06/2013   Results for orders placed during the hospital encounter of 07/05/13 (from the past 24 hour(s))  TROPONIN I     Status: None   Collection Time    07/06/13  8:35 AM      Result Value Range   Troponin I <0.30  <0.30 ng/mL  HEPARIN LEVEL (UNFRACTIONATED)     Status: Abnormal   Collection Time    07/06/13  8:38 AM      Result Value Range   Heparin Unfractionated 0.29 (*) 0.30 - 0.70 IU/mL  URINALYSIS, ROUTINE W REFLEX MICROSCOPIC     Status: Abnormal   Collection Time    07/06/13  9:47 AM      Result Value Range   Color, Urine YELLOW  YELLOW   APPearance CLEAR  CLEAR   Specific Gravity, Urine 1.027  1.005 - 1.030   pH 5.0  5.0 - 8.0   Glucose, UA >1000 (*) NEGATIVE mg/dL   Hgb urine dipstick NEGATIVE  NEGATIVE   Bilirubin Urine NEGATIVE  NEGATIVE   Ketones, ur NEGATIVE  NEGATIVE mg/dL   Protein, ur NEGATIVE  NEGATIVE mg/dL   Urobilinogen, UA 0.2  0.0 - 1.0 mg/dL   Nitrite NEGATIVE  NEGATIVE   Leukocytes, UA NEGATIVE  NEGATIVE  URINE MICROSCOPIC-ADD ON     Status: Abnormal   Collection Time    07/06/13  9:47 AM      Result Value Range   Squamous Epithelial / LPF MANY (*) RARE   WBC, UA 0-2  <3 WBC/hpf   RBC / HPF 0-2  <3 RBC/hpf   Bacteria, UA RARE  RARE  GLUCOSE, CAPILLARY     Status: Abnormal   Collection Time    07/06/13 12:58 PM      Result Value Range   Glucose-Capillary 107 (*) 70 - 99 mg/dL    GLUCOSE, CAPILLARY     Status: Abnormal   Collection Time    07/06/13  4:47 PM      Result Value Range   Glucose-Capillary 151 (*) 70 - 99 mg/dL  HEPARIN LEVEL (UNFRACTIONATED)     Status: Abnormal   Collection Time    07/06/13  5:30 PM      Result Value Range   Heparin Unfractionated 0.28 (*) 0.30 - 0.70 IU/mL  GLUCOSE, CAPILLARY     Status: Abnormal   Collection Time    07/06/13  9:45 PM      Result Value Range   Glucose-Capillary 177 (*) 70 - 99 mg/dL  CBC     Status: Abnormal   Collection Time    07/07/13  4:00 AM      Result Value Range   WBC 4.8  4.0 - 10.5 K/uL   RBC 4.04 (*) 4.22 - 5.81 MIL/uL  Hemoglobin 12.4 (*) 13.0 - 17.0 g/dL   HCT 71.0 (*) 62.6 - 94.8 %   MCV 87.9  78.0 - 100.0 fL   MCH 30.7  26.0 - 34.0 pg   MCHC 34.9  30.0 - 36.0 g/dL   RDW 54.6  27.0 - 35.0 %   Platelets 195  150 - 400 K/uL  HEPARIN LEVEL (UNFRACTIONATED)     Status: None   Collection Time    07/07/13  4:00 AM      Result Value Range   Heparin Unfractionated 0.48  0.30 - 0.70 IU/mL  PROTIME-INR     Status: Abnormal   Collection Time    07/07/13  4:00 AM      Result Value Range   Prothrombin Time 21.4 (*) 11.6 - 15.2 seconds   INR 1.92 (*) 0.00 - 1.49  BASIC METABOLIC PANEL     Status: Abnormal   Collection Time    07/07/13  4:00 AM      Result Value Range   Sodium 136 (*) 137 - 147 mEq/L   Potassium 3.6 (*) 3.7 - 5.3 mEq/L   Chloride 99  96 - 112 mEq/L   CO2 25  19 - 32 mEq/L   Glucose, Bld 234 (*) 70 - 99 mg/dL   BUN 16  6 - 23 mg/dL   Creatinine, Ser 0.93  0.50 - 1.35 mg/dL   Calcium 8.0 (*) 8.4 - 10.5 mg/dL   GFR calc non Af Amer >90  >90 mL/min   GFR calc Af Amer >90  >90 mL/min    Intake/Output Summary (Last 24 hours) at 07/07/13 0831 Last data filed at 07/07/13 0700  Gross per 24 hour  Intake 861.91 ml  Output      0 ml  Net 861.91 ml   EKG:  NSR, rate 71, axis WNL, QTc 470.   07/07/2013  ASSESSMENT AND PLAN:  ATRIAL FIB:  Now in NSR.  INR is therapeutic.   Changed to PO amiodarone.  OK to discharge in the AM.  I will reduce to 200 mg bid.   CARDIOMYOPATHY:   Continue current therapy.  He will need to go home on Kdur 40 meq daily.  However, he needs a BMET next week.    HTN:    This is being managed in the context of treating his CHF  Schedule follow up with Dr. Kristeen Miss.  Should be seen for TOC appt within 2 weeks.  I sent a message to Dr. Elease Hashimoto to follow up.    Rollene Rotunda 07/07/2013 8:31 AM

## 2013-07-07 NOTE — Telephone Encounter (Signed)
Pt admitted 1/16-18/2015 with atrial flutter and afib 2/2 noncompliance.   Also with hyperglycemia to >500 plz complete transitional care phone call if not done by cardiology and schedule f/u visit in 1-2 wks and ensure cardiology appt scheduled as well.

## 2013-07-07 NOTE — Progress Notes (Signed)
Evening telemetry strip showed a QTc of 0.54.  12-lead EKG was done to confirm readings.  EKG, now in epic, showed QTc of .  Pt has no complaints and vital signs are stable. Called Dr. Verdie Mosher from [facility] cardiology to notify him of same.  MD confirmed current dose of amiodarone to continue as ordered and an EKG is to be obtained prior to administration of next dose.  Will continue to monitor.

## 2013-07-07 NOTE — Progress Notes (Signed)
D/c instructions reviewed and discussed with patient. All questions answered and pt verbalized understanding.  

## 2013-07-07 NOTE — Discharge Summary (Signed)
PATIENT DETAILS Name: Johnathan Archer Age: 43 y.o. Sex: male Date of Birth: 07/10/1970 MRN: [id number]. Admit Date: 07/05/2013 Admitting Physician: Maretta BeesShanker M Ghimire, MD WUJ:[id number]:Javier Sharen HonesGutierrez, MD  Recommendations for Outpatient Follow-up:  1. Optimize Diabetic Regimen 2. Check INR in the next 2-3 days. 3. Needs counseling regarding compliance  PRIMARY DISCHARGE DIAGNOSIS:  Principal Problem:   Atrial fibrillation with rapid ventricular response Active Problems:   Hypertension   Dilated idiopathic cardiomyopathy   Diabetic hyperosmolar non-ketotic state      PAST MEDICAL HISTORY: Past Medical History  Diagnosis Date  . Hypertension   . Type II or unspecified type diabetes mellitus without mention of complication, uncontrolled   . 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  cath 2004 negative for obstruction, last echo 07/2011 EF 35%. b) Normal coronary arteries by cath in 2004 (when EF was noted at 15%). c) Last EF 20% by echo 12/2012  . History of chicken pox   . OSA (obstructive sleep apnea)   . Mitral regurgitation     Moderate by echo 07/2011  . ED (erectile dysfunction)   . Atrial fibrillation with RVR 10/2011    lone episode, converted with IV dilt, on coumadin given elevated CHADS2  . Warfarin anticoagulation     DISCHARGE MEDICATIONS:   Medication List         amiodarone 200 MG tablet  Commonly known as:  PACERONE  Take 1 tablet (200 mg total) by mouth 2 (two) times daily.     carvedilol 6.25 MG tablet  Commonly known as:  COREG  Take 4 tablets (25 mg total) by mouth 2 (two) times daily.     furosemide 40 MG tablet  Commonly known as:  LASIX  Take 1 tablet (40 mg total) by mouth 2 (two) times daily.     glipiZIDE 10 MG tablet  Commonly known as:  GLUCOTROL  Take 1 tablet (10 mg total) by mouth 2 (two) times daily before a meal.     Insulin Detemir 100 UNIT/ML Pen  Commonly known  as:  LEVEMIR  Inject 40 Units into the skin daily at 10 pm.     lisinopril 10 MG tablet  Commonly known as:  PRINIVIL,ZESTRIL  Take 1 tablet (10 mg total) by mouth daily.     metFORMIN 500 MG tablet  Commonly known as:  GLUCOPHAGE  Take 1 tablet (500 mg total) by mouth 2 (two) times daily with a meal.     Pen Needles 31G X 6 MM Misc  Use as directed     Potassium Chloride ER 20 MEQ Tbcr  Take 40 mEq by mouth daily.     spironolactone 25 MG tablet  Commonly known as:  ALDACTONE  Take 1 tablet (25 mg total) by mouth daily.     warfarin 5 MG tablet  Commonly known as:  COUMADIN  Take 2 tablets (10 mg total) by mouth daily. Take 3 tablets (15 mg) on 07/07/13 and then from 07/08/13-resume 2 tablets (10 mg) daily.        ALLERGIES:  No Known Allergies  BRIEF HPI:  See H&P, Labs, Consult and Test reports for all details in brief, patientis a 43 y.o. male with a Past Medical History of possible atrial fibrillation on Coumadin, nonischemic cardiomyopathy, diabetes, hypertension, history of noncompliance who presented with Chest pain and shortness of breath since the morning of his admission day.the ED he was found to  have atrial fibrillation with rapid ventricular response. He was also found to have a CBG>500.  CONSULTATIONS:   cardiology  PERTINENT RADIOLOGIC STUDIES: Dg Chest Port 1 View  07/05/2013   CLINICAL DATA:  Chest pressure.  Dizziness.  EXAM: PORTABLE CHEST - 1 VIEW  COMPARISON:  12/17/2012  FINDINGS: Artifact and support devices overlie the chest. The heart is mildly enlarged. Mediastinal shadows are normal. The lungs are clear. Vascularity is normal. No effusions.  IMPRESSION: No active disease.  Extensive overlying artifactual density.   Electronically Signed   By: Paulina Fusi M.D.   On: 07/05/2013 14:07     PERTINENT LAB RESULTS: CBC:  Recent Labs  07/06/13 0328 07/07/13 0400  WBC 5.2 4.8  HGB 12.9* 12.4*  HCT 36.6* 35.5*  PLT 222 195   CMET CMP      Component Value Date/Time   NA 136* 07/07/2013 0400   K 3.6* 07/07/2013 0400   CL 99 07/07/2013 0400   CO2 25 07/07/2013 0400   GLUCOSE 234* 07/07/2013 0400   BUN 16 07/07/2013 0400   CREATININE 1.00 07/07/2013 0400   CALCIUM 8.0* 07/07/2013 0400   PROT 8.1 11/10/2011 0523   ALBUMIN 3.5 11/10/2011 0523   AST 26 11/10/2011 0523   ALT 15 11/10/2011 0523   ALKPHOS 69 11/10/2011 0523   BILITOT 0.6 11/10/2011 0523   GFRNONAA >90 07/07/2013 0400   GFRAA >90 07/07/2013 0400    GFR Estimated Creatinine Clearance: 116 ml/min (by C-G formula based on Cr of 1). No results found for this basename: LIPASE, AMYLASE,  in the last 72 hours  Recent Labs  07/05/13 1750 07/06/13 0333 07/06/13 0835  TROPONINI <0.30 <0.30 <0.30   No components found with this basename: POCBNP,  No results found for this basename: DDIMER,  in the last 72 hours  Recent Labs  07/05/13 1750  HGBA1C 13.7*   No results found for this basename: CHOL, HDL, LDLCALC, TRIG, CHOLHDL, LDLDIRECT,  in the last 72 hours  Recent Labs  07/05/13 1750  TSH 0.462   No results found for this basename: VITAMINB12, FOLATE, FERRITIN, TIBC, IRON, RETICCTPCT,  in the last 72 hours Coags:  Recent Labs  07/06/13 0328 07/07/13 0400  INR 2.02* 1.92*   Microbiology: Recent Results (from the past 240 hour(s))  MRSA PCR SCREENING     Status: None   Collection Time    07/05/13  5:58 PM      Result Value Range Status   MRSA by PCR NEGATIVE  NEGATIVE Final   Comment:            The GeneXpert MRSA Assay (FDA     approved for NASAL specimens     only), is one component of a     comprehensive MRSA colonization     surveillance program. It is not     intended to diagnose MRSA     infection nor to guide or     monitor treatment for     MRSA infections.     BRIEF HOSPITAL COURSE:  Atrial fibrillation with rapid ventricular response  - patient admitted and started on IV amiodarone infusion. INR was subtherapeutic, and started on IV  heparin infusion along with Coumadin. Patient converted to sinus rhythm with IV amiodarone. He was then transitioned to oral amiodarone. Was seen by cardiology throughout his hospital stay, current recommendations from cardiology are to continue with amiodarone at discharge. Continue with Warfarin on discharge, INR slightly sub therapeutic on discharge, have instructed  patient to take 15 mg today, and resume 10 mg dose from tommorow. Also have asked patient to make himself a follow up appt at the coumadin clinic in the next 2-3 days for INR check. Cardiology will arrange for outpatient follow up.  Hyperosmolar nonketotic hyperglycemia  - Secondary to noncompliance  - Patient was admitted and started on IV insulin. He is now being transitioned to Levemir and NovoLog.  - A1c significantly elevated at 13.7, he will need insulin on discharge. He will be discharged on Levemir 40 units. He has used Insulin in the past and is familiar with insulin pens. I have counseled him extensively regarding importance of being compliant with diet, medications and follow up. Importance of strict glycemic control has been emphasized, risk of long term organ damage have been explained.  Chronic systolic heart failure  - Clinically compensated, has underlying history of nonischemic cardiomyopathy.  - Continue with Aldactone, lisinopril, Lasix and Coreg   Hypertension  - Controlled, continue current medications  TODAY-DAY OF DISCHARGE:  Subjective:   Taevyn Vanalst today has no headache,no chest abdominal pain,no new weakness tingling or numbness, feels much better wants to go home today.   Objective:   Blood pressure 153/83, pulse 70, temperature 98.1 F (36.7 C), temperature source Oral, resp. rate 17, height 5\' 11"  (1.803 m), weight 102.44 kg (225 lb 13.4 oz), SpO2 98.00%.  Intake/Output Summary (Last 24 hours) at 07/07/13 0904 Last data filed at 07/07/13 0700  Gross per 24 hour  Intake 861.91 ml   Output      0 ml  Net 861.91 ml   Filed Weights   07/05/13 1740 07/06/13 0353 07/07/13 0321  Weight: 102.1 kg (225 lb 1.4 oz) 100.8 kg (222 lb 3.6 oz) 102.44 kg (225 lb 13.4 oz)    Exam Awake Alert, Oriented *3, No new F.N deficits, Normal affect [place].AT,PERRAL Supple Neck,No JVD, No cervical lymphadenopathy appriciated.  Symmetrical Chest wall movement, Good air movement bilaterally, CTAB RRR,No Gallops,Rubs or new Murmurs, No Parasternal Heave +ve B.Sounds, Abd Soft, Non tender, No organomegaly appriciated, No rebound -guarding or rigidity. No Cyanosis, Clubbing or edema, No new Rash or bruise  DISCHARGE CONDITION: Stable  DISPOSITION: Home  DISCHARGE INSTRUCTIONS:    Activity:  As tolerated   Diet recommendation: Diabetic Diet Heart Healthy diet      Discharge Orders   Future Orders Complete By Expires   (HEART FAILURE PATIENTS) Call MD:  Anytime you have any of the following symptoms: 1) 3 pound weight gain in 24 hours or 5 pounds in 1 week 2) shortness of breath, with or without a dry hacking cough 3) swelling in the hands, feet or stomach 4) if you have to sleep on extra pillows at night in order to breathe.  As directed    Amb Referral to Nutrition and Diabetic E  As directed    Diet - low sodium heart healthy  As directed    Diet Carb Modified  As directed    Increase activity slowly  As directed       Follow-up Information   Follow up with Eustaquio Boyden, MD. Schedule an appointment as soon as possible for a visit in 1 week.   Specialty:  Family Medicine   Contact information:   [address] [place] [address] [phone]       Follow up with Elyn Aquas., MD. Schedule an appointment as soon as possible for a visit in 1 week.   Specialty:  Cardiology   Contact information:   [address] [place] [address] [phone]       Follow up with [facility]. Schedule an appointment as soon as  possible for a visit in 2 days.   Specialty:  Cardiology   Contact information:   [address] [place] [address] [phone]        Total Time spent on discharge equals 45 minutes.  SignedJeoffrey Massed 07/07/2013 9:04 AM

## 2013-07-07 NOTE — H&P (Signed)
PATIENT DETAILS  Name: Johnathan Archer  Age: 43 y.o.  Sex: male  Date of Birth: 1970/10/25  Admit Date: 07/05/2013  FXT:[id number] Sharen Hones, MD   CHIEF COMPLAINT:  Chest pain and shortness of breath since this morning   HPI:  Johnathan Archer is a 43 y.o. male with a Past Medical History of possible atrial fibrillation on Coumadin, nonischemic cardiomyopathy, diabetes, hypertension, history of noncompliance who presents today with the above noted complaint. Per patient, around 4:00 this morning he woke up with chest tightness. He claims that it was around 7/10 at its worse, there was no radiation, no associated nausea vomiting but some associated dizziness. It lasted for approximately an hour. However it reoccurred as a result they shouldn't presented to the emergency room for further evaluation. In the ED he was found to have atrial fibrillation with rapid ventricular response. Cardiology was consulted, however since patient's blood sugar was more than 500, the hospitalist service was asked to admit for further evaluation and treatment. In the emergency room, patient received IV Cardizem. Cardiology currently recommending amiodarone. During my evaluation, patient heart rate was down to the 90s, he is awake alert. He no longer had any chest pain or shortness of breath.   ALLERGIES:  No Known Allergies   PAST MEDICAL HISTORY:  Past Medical History   Diagnosis  Date   .  Hypertension    .  Type II or unspecified type diabetes mellitus without mention of complication, uncontrolled    .  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 cath 2004 negative for obstruction, last echo 07/2011 EF 35%. b) Normal coronary arteries by cath in 2004 (when EF was noted at 15%). c) Last EF 20% by echo 12/2012   .  History of chicken pox    .  OSA (obstructive sleep apnea)    .  Mitral regurgitation      Moderate by echo 07/2011   .   ED (erectile dysfunction)    .  Atrial fibrillation with RVR  10/2011     lone episode, converted with IV dilt, on coumadin given elevated CHADS2   .  Warfarin anticoagulation     PAST SURGICAL HISTORY:  Past Surgical History   Procedure  Laterality  Date   .  Hernia repair   2002   .  Knee surgery       L knee in HS   .  Cardiac catheterization   2004     no blockages   .  US echocardiography   07/2011     mod LVH, EF 35%, diffuse hypokinesis of entire myocardium, irreversible restrictive pattern (grade 4 diastolic dysfunction), mod MR, mod dilated LA/RA, decreased RV systolic fxn, no PFO   .  US echocardiography   12/2012     severe LVH, EF 20-25%, mod MR, reduced RV systolic function     MEDICATIONS AT HOME:  Prior to Admission medications   Medication  Sig  Start Date  End Date  Taking?  Authorizing Provider   carvedilol (COREG) 25 MG tablet  Take 1 tablet (25 mg total) by mouth 2 (two) times daily.  02/04/13   Yes  Rosalio Macadamia, NP   furosemide (LASIX) 40 MG tablet  Take 40 mg by mouth 2 (two) times daily.    Yes  Historical Provider, MD   glipiZIDE (GLUCOTROL) 10 MG tablet  Take 1 tablet (10  mg total) by mouth 2 (two) times daily before a meal.  12/17/12   Yes  Ankit Nanavati, MD   lisinopril (PRINIVIL,ZESTRIL) 10 MG tablet  Take 1 tablet (10 mg total) by mouth daily.  02/04/13   Yes  Rosalio MacadamiaLori C Gerhardt, NP   spironolactone (ALDACTONE) 25 MG tablet  Take 1 tablet (25 mg total) by mouth daily.  02/26/13   Yes  Rosalio MacadamiaLori C Gerhardt, NP   warfarin (COUMADIN) 5 MG tablet  Take 10 mg by mouth daily.    Yes  Historical Provider, MD    FAMILY HISTORY:  Family History   Problem  Relation  Age of Onset   .  Diabetes  Mother    .  Hypertension  Father    .  Stroke  Neg Hx    .  Coronary artery disease  Neg Hx    .  Cancer  Neg Hx     SOCIAL HISTORY:  reports that he has never smoked. He has never used smokeless tobacco. He reports that he drinks alcohol. He reports that he does not use illicit  drugs.   REVIEW OF SYSTEMS:  Constitutional:  No weight loss, night sweats, Fevers, chills, fatigue.   HEENT:   No headaches, Difficulty swallowing,Tooth/dental problems,Sore throat,  No sneezing, itching, ear ache, nasal congestion, post nasal drip,   Cardio-vascular:  No swelling in lower extremities, anasarca, dizziness, palpitations   GI:  No heartburn, indigestion, abdominal pain, nausea, vomiting, diarrhea, change in bowel habits, loss of appetite   Resp:  No excess mucus, no productive cough, No non-productive cough, No coughing up of blood.No change in color of mucus.No wheezing.No chest wall deformity   Skin:  no rash or lesions.   GU:  no dysuria, change in color of urine, no urgency or frequency. No flank pain.   Musculoskeletal:  No joint pain or swelling. No decreased range of motion. No back pain.   Psych:  No change in mood or affect. No depression or anxiety. No memory loss.   PHYSICAL EXAM:  Blood pressure 117/81, pulse 95, temperature 97.6 F (36.4 C), temperature source Oral, resp. rate 15, height 5\' 11"  (1.803 m), weight 102.059 kg (225 lb), SpO2 99.00%.  General appearance :Awake, alert, not in any distress. Speech Clear. Not toxic Looking  HEENT: Atraumatic and Normocephalic, pupils equally reactive to light and accomodation  Neck: supple, no JVD. No cervical lymphadenopathy.  Chest:Good air entry bilaterally, no added sounds  CVS: S1 S2 regular, no murmurs.  Abdomen: Bowel sounds present, Non tender and not distended with no gaurding, rigidity or rebound.  Extremities: B/L Lower Ext shows no edema, both legs are warm to touch  Neurology: Awake alert, and oriented X 3, CN II-XII intact, Non focal  Skin:No Rash  Wounds:[name]   LABS ON ADMISSION:   Recent Labs   07/05/13 1338   NA  133*   K  4.3   CL  93*   CO2  22   GLUCOSE  542*   BUN  24*   CREATININE  1.22   CALCIUM  9.0   MG  2.0    No results found for this basename: AST, ALT,  ALKPHOS, BILITOT, PROT, ALBUMIN, in the last 72 hours  No results found for this basename: LIPASE, AMYLASE, in the last 72 hours   Recent Labs   07/05/13 1338   WBC  5.5   NEUTROABS  3.3   HGB  15.2   HCT  41.7  MCV  85.1   PLT  257    No results found for this basename: CKTOTAL, CKMB, CKMBINDEX, TROPONINI, in the last 72 hours  No results found for this basename: DDIMER, in the last 72 hours  No components found with this basename: POCBNP,   RADIOLOGIC STUDIES ON ADMISSION:  Dg Chest Port 1 View  07/05/2013 CLINICAL DATA: Chest pressure. Dizziness. EXAM: PORTABLE CHEST - 1 VIEW COMPARISON: 12/17/2012 FINDINGS: Artifact and support devices overlie the chest. The heart is mildly enlarged. Mediastinal shadows are normal. The lungs are clear. Vascularity is normal. No effusions. IMPRESSION: No active disease. Extensive overlying artifactual density. Electronically Signed By: Paulina Fusi M.D. On: 07/05/2013 14:07   EKG: Independently reviewed. Atrial fibrillation   ASSESSMENT AND PLAN:  Present on Admission:  . Atrial fibrillation with rapid ventricular response  - Cardiology consulted, current recommendations are continue with IV amiodarone, await INR is subtherapeutic to start IV heparin. Would defer further management to cardiology. Monitor in telemetry and cycle cardiac enzymes.   . Dilated idiopathic cardiomyopathy  - Currently compensated.  - On Lasix- which will be continued  - Monitor closely, check daily weights and strict intake output   . Hypertension  - Currently stable, continue preadmission medications   . Diabetic hyperosmolar non-ketotic state  - Start insulin for glucose nebulizer protocol, once CBGs and range, we'll transition to subcutaneous levemir with sliding scale coverage  - Check A1c  Further plan will depend as patient's clinical course evolves and further radiologic and laboratory data become available. Patient will be monitored closely.  Above noted  plan was discussed with patien, he was in agreement.   DVT Prophylaxis:  On Coumadin   Code Status:  Full Code   Total time spent for admission equals 45 minutes.  [facility]  [facility]  Pager [phone]   If 7PM-7AM, please contact night-coverage  [email or web address]  Password [email or web address]  07/05/2013, 5:13 PM

## 2013-07-09 ENCOUNTER — Ambulatory Visit (INDEPENDENT_AMBULATORY_CARE_PROVIDER_SITE_OTHER): Payer: BC Managed Care – PPO | Admitting: *Deleted

## 2013-07-09 ENCOUNTER — Encounter: Payer: Self-pay | Admitting: Physician Assistant

## 2013-07-09 DIAGNOSIS — I4891 Unspecified atrial fibrillation: Secondary | ICD-10-CM

## 2013-07-09 DIAGNOSIS — Z7901 Long term (current) use of anticoagulants: Secondary | ICD-10-CM

## 2013-07-09 LAB — POCT INR: INR: 2.1

## 2013-07-09 NOTE — Telephone Encounter (Signed)
Transitional Care Call.  Pt d/c'd from hospital on 07/07/13.  D/C dx: Atrial fibrillation with rapid ventricular response.  Pt states he is doing well.  He is still trying to get his new medications (insulin and Coreg), delayed r/t insurance issues.  Pt expects to be able to get them today.  Denies chest pain or SOB.  Ambulating and performing ADLs independently.  Denies questions r/t medication list or d/c instructions.  Denies questions for Dr. Sharen Hones.  Cardiology appt scheduled for 1/30.  Hospital follow up scheduled for 1/26.

## 2013-07-15 ENCOUNTER — Ambulatory Visit: Payer: Self-pay | Admitting: Family Medicine

## 2013-07-15 DIAGNOSIS — Z0289 Encounter for other administrative examinations: Secondary | ICD-10-CM

## 2013-07-19 ENCOUNTER — Encounter: Payer: BC Managed Care – PPO | Admitting: Cardiovascular Disease

## 2013-07-19 NOTE — Progress Notes (Signed)
Johnathan Archer Date of Birth  Oct 03, 1970 [facility]     [place] [facility]  [address]    [address]   [address] [place], [place]  [address]    [place], [place]  [address] [phone]  Fax  [phone]  [phone]  Fax [phone]  Problems: 1. Congestive heart failure, EF equals 35%, nonobstructive coronary artery disease by cath in 2004 ( former patient of Donnie Aho) 2. Hypertension 3. Diabetes mellitus-type II 4. Sleep apnea 5. Atrial fibrillation   History of Present Illness:  Johnathan Archer to 43 year old gentleman with a history of idiopathic dilated cardiomyopathy since 2004. He had a cardiac catheterization at that time which revealed nonobstructive coronary artery disease.  He's been on medical therapy since that time.  Recently he's noticed lots of fatigue and also lots of leg swelling.  He does avoid eating fried foods or salty foods.  December 31, 2012:  Johnathan Archer presents with atrial fib with RVR.  He was diagnosed in May 2013.  He typically runs out of his meds - ran out of coreg 3 days ago.  He has not been to coumadin clinic.  He takes 1 coumadin a day ( 5 mg).  Last INR is 1.27.    He has DOE, extreme fatigue.    He tries to avoid salt.  He has been forcing down lots of water thinking that he is helping himself.  He has been to the [facility].    Current Outpatient Prescriptions on File Prior to Visit  Medication Sig Dispense Refill  . amiodarone (PACERONE) 200 MG tablet Take 1 tablet (200 mg total) by mouth 2 (two) times daily.  60 tablet  0  . carvedilol (COREG) 6.25 MG tablet Take 4 tablets (25 mg total) by mouth 2 (two) times daily.  60 tablet  0  . furosemide (LASIX) 40 MG tablet Take 1 tablet (40 mg total) by mouth 2 (two) times daily.  60 tablet  0  . glipiZIDE (GLUCOTROL) 10 MG tablet Take 1 tablet (10 mg total) by mouth 2 (two) times daily before a meal.  60 tablet  0  . Insulin Detemir (LEVEMIR) 100  UNIT/ML Pen Inject 40 Units into the skin daily at 10 pm.  15 mL  0  . Insulin Pen Needle (PEN NEEDLES) 31G X 6 MM MISC Use as directed  50 each  0  . lisinopril (PRINIVIL,ZESTRIL) 10 MG tablet Take 1 tablet (10 mg total) by mouth daily.  30 tablet  0  . metFORMIN (GLUCOPHAGE) 500 MG tablet Take 1 tablet (500 mg total) by mouth 2 (two) times daily with a meal.  60 tablet  0  . potassium chloride 20 MEQ TBCR Take 40 mEq by mouth daily.  60 tablet  0  . spironolactone (ALDACTONE) 25 MG tablet Take 1 tablet (25 mg total) by mouth daily.  30 tablet  0  . warfarin (COUMADIN) 5 MG tablet Take 2 tablets (10 mg total) by mouth daily. Take 3 tablets (15 mg) on 07/07/13 and then from 07/08/13-resume 2 tablets (10 mg) daily.  30 tablet  0   No current facility-administered medications on file prior to visit.    No Known Allergies  Past Medical History  Diagnosis Date  . Hypertension   . Type II or unspecified type diabetes mellitus without mention of complication, uncontrolled   . Systolic and diastolic CHF, chronic 2004    a) Idiopathic dilated CM, thought possibly  due to viral myocarditis.  cath 2004 negative for obstruction, last echo 07/2011 EF 35%. b) Normal coronary arteries by cath in 2004 (when EF was noted at 15%). c) Last EF 20% by echo 12/2012  . History of chicken pox   . OSA (obstructive sleep apnea)   . Mitral regurgitation     Moderate by echo 07/2011  . ED (erectile dysfunction)   . Atrial fibrillation with RVR 10/2011    lone episode, converted with IV dilt, on coumadin given elevated CHADS2  . Warfarin anticoagulation     Past Surgical History  Procedure Laterality Date  . Hernia repair  2002  . Knee surgery      L knee in HS  . Cardiac catheterization  2004    no blockages  . [place] echocardiography  07/2011    mod LVH, EF 35%, diffuse hypokinesis of entire myocardium, irreversible restrictive pattern (grade 4 diastolic dysfunction), mod MR, mod dilated LA/RA, decreased RV systolic  fxn, no PFO  . [place] echocardiography  12/2012    severe LVH, EF 20-25%, mod MR, reduced RV systolic function    History  Smoking status  . Never Smoker   Smokeless tobacco  . Never Used    History  Alcohol Use  . Yes    Comment: Occasional 1x/mo    Family History  Problem Relation Age of Onset  . Diabetes Mother   . Hypertension Father   . Stroke Neg Hx   . Coronary artery disease Neg Hx   . Cancer Neg Hx     Reviw of Systems:  Reviewed in the HPI.  All other systems are negative.  Physical Exam: There were no vitals taken for this visit. General: Well developed, well nourished, in no acute distress.  Head: Normocephalic, atraumatic, sclera non-icteric, mucus membranes are moist,   Neck: Supple. Carotids are 2 + without bruits. No JVD  Lungs: Clear bilaterally to auscultation.  Heart: His heart is irregularly irregular. He is very tachycardic.Marland Kitchen.  normal  S1 S2. No murmurs, gallops or rubs.  Abdomen: Soft, non-tender, non-distended with normal bowel sounds. No hepatomegaly. No rebound/guarding. No masses.  Msk:  Strength and tone are normal  Extremities: No clubbing or cyanosis. No edema.  Distal pedal pulses are 2+ and equal bilaterally.  Neuro: Alert and oriented X 3. Moves all extremities spontaneously.  Psych:  Responds to questions appropriately with a normal affect.  ECG: 2014: Atrial flutter with variable AV block. His ventricular rate is 143. His moderate voltage criteria for left ventricular hypertrophy. He has associated ST and T wave changes. Assessment / Plan:

## 2013-08-02 ENCOUNTER — Encounter: Payer: Self-pay | Admitting: Cardiovascular Disease

## 2013-08-14 ENCOUNTER — Other Ambulatory Visit: Payer: Self-pay | Admitting: Internal Medicine

## 2013-08-19 ENCOUNTER — Ambulatory Visit (INDEPENDENT_AMBULATORY_CARE_PROVIDER_SITE_OTHER): Payer: BC Managed Care – PPO | Admitting: Pharmacist

## 2013-08-19 DIAGNOSIS — I4891 Unspecified atrial fibrillation: Secondary | ICD-10-CM

## 2013-08-19 DIAGNOSIS — Z7901 Long term (current) use of anticoagulants: Secondary | ICD-10-CM

## 2013-08-19 LAB — POCT INR: INR: 1.4

## 2013-08-19 MED ORDER — AMIODARONE HCL 200 MG PO TABS: 200.0000 mg | ORAL_TABLET | Freq: Two times a day (BID) | ORAL | Status: DC

## 2013-08-19 MED ORDER — WARFARIN SODIUM 5 MG PO TABS: ORAL_TABLET | ORAL | Status: DC

## 2013-08-19 MED ORDER — SPIRONOLACTONE 25 MG PO TABS: 25.0000 mg | ORAL_TABLET | Freq: Every day | ORAL | Status: DC

## 2013-08-19 MED ORDER — POTASSIUM CHLORIDE ER 20 MEQ PO TBCR: 40.0000 meq | EXTENDED_RELEASE_TABLET | Freq: Every day | ORAL | Status: DC

## 2013-08-19 MED ORDER — FUROSEMIDE 40 MG PO TABS: 40.0000 mg | ORAL_TABLET | Freq: Two times a day (BID) | ORAL | Status: DC

## 2013-08-19 MED ORDER — CARVEDILOL 12.5 MG PO TABS: 12.5000 mg | ORAL_TABLET | Freq: Two times a day (BID) | ORAL | Status: DC

## 2013-09-13 ENCOUNTER — Encounter: Payer: Self-pay | Admitting: Cardiovascular Disease

## 2013-09-13 ENCOUNTER — Ambulatory Visit (INDEPENDENT_AMBULATORY_CARE_PROVIDER_SITE_OTHER): Payer: BC Managed Care – PPO | Admitting: Pharmacist

## 2013-09-13 ENCOUNTER — Ambulatory Visit (INDEPENDENT_AMBULATORY_CARE_PROVIDER_SITE_OTHER): Payer: BC Managed Care – PPO | Admitting: Cardiovascular Disease

## 2013-09-13 VITALS — BP 170/116 | HR 80 | Ht 71.0 in | Wt 227.0 lb

## 2013-09-13 DIAGNOSIS — I5042 Chronic combined systolic (congestive) and diastolic (congestive) heart failure: Secondary | ICD-10-CM

## 2013-09-13 DIAGNOSIS — I4891 Unspecified atrial fibrillation: Secondary | ICD-10-CM

## 2013-09-13 DIAGNOSIS — Z7901 Long term (current) use of anticoagulants: Secondary | ICD-10-CM

## 2013-09-13 DIAGNOSIS — I509 Heart failure, unspecified: Secondary | ICD-10-CM

## 2013-09-13 DIAGNOSIS — I428 Other cardiomyopathies: Secondary | ICD-10-CM

## 2013-09-13 DIAGNOSIS — I420 Dilated cardiomyopathy: Secondary | ICD-10-CM

## 2013-09-13 LAB — POCT INR: INR: 1.1

## 2013-09-13 MED ORDER — POTASSIUM CHLORIDE ER 20 MEQ PO TBCR: 40.0000 meq | EXTENDED_RELEASE_TABLET | Freq: Every day | ORAL | Status: DC

## 2013-09-13 MED ORDER — LISINOPRIL 10 MG PO TABS: 10.0000 mg | ORAL_TABLET | Freq: Every day | ORAL | Status: DC

## 2013-09-13 MED ORDER — SPIRONOLACTONE 25 MG PO TABS: 25.0000 mg | ORAL_TABLET | Freq: Every day | ORAL | Status: DC

## 2013-09-13 MED ORDER — AMIODARONE HCL 200 MG PO TABS: 200.0000 mg | ORAL_TABLET | Freq: Every day | ORAL | Status: DC

## 2013-09-13 MED ORDER — CARVEDILOL 12.5 MG PO TABS: 12.5000 mg | ORAL_TABLET | Freq: Two times a day (BID) | ORAL | Status: DC

## 2013-09-13 MED ORDER — SILDENAFIL CITRATE 100 MG PO TABS: 100.0000 mg | ORAL_TABLET | Freq: Every day | ORAL | Status: DC | PRN

## 2013-09-13 NOTE — Assessment & Plan Note (Signed)
Increase his carvedilol to 12.5 mg twice a day. His other medications will remain the same. We've instructed him to avoid eating any extra salt.

## 2013-09-13 NOTE — Assessment & Plan Note (Signed)
Increase his carvedilol to 12.5 mg twice a day. Continue his other medications.

## 2013-09-13 NOTE — Patient Instructions (Signed)
Decrease Amiodarone 200 mg daily  Take Coreg 12.5 mg twice a day   Your physician recommends that you schedule a follow-up appointment in: 3 months with lab ( bmet ) you may eat

## 2013-09-13 NOTE — Assessment & Plan Note (Signed)
Johnathan Archer seems to be doing better. He has maintained sinus rhythm. We will reduce his amiodarone to 200 mg a day-this seems to be how he was taking it although his chart stated he was supposed to be taking it twice a day.  We will increase his carvedilol to 12.5 mg twice a day.

## 2013-09-13 NOTE — Progress Notes (Signed)
Johnathan Archer Date of Birth  1970-10-21 [facility]     [place] [facility]  [address]    [address]   [address] [place], [place]  [address]    [place], [place]  [address] [phone]  Fax  [phone]  [phone]  Fax [phone]  Problems: 1. Congestive heart failure, EF equals 35%, nonobstructive coronary artery disease by cath in 2004 ( former patient of Donnie Aho) 2. Hypertension 3. Diabetes mellitus-type II 4. Sleep apnea 5. Atrial fibrillation   History of Present Illness:  Johnathan Archer to 43 year old gentleman with a history of idiopathic dilated cardiomyopathy since 2004. He had a cardiac catheterization at that time which revealed nonobstructive coronary artery disease.  He's been on medical therapy since that time.  Recently he's noticed lots of fatigue and also lots of leg swelling.  He does avoid eating fried foods or salty foods.  December 31, 2012:  Johnathan Archer presents with atrial fib with RVR.  He was diagnosed in May 2013.  He typically runs out of his meds - ran out of coreg 3 days ago.  He has not been to coumadin clinic.  He takes 1 coumadin a day ( 5 mg).  Last INR is 1.27.    He has DOE, extreme fatigue.    He tries to avoid salt.  He has been forcing down lots of water thinking that he is helping himself.  He has been to the [facility].    September 13, 2013:  Johnathan Archer has been doing well. He's been getting some regular exercise. He recently joined a gym.     He missed his medicines yesterday. As a result on his blood pressure is fairly high today.  He still eats some canned foods.  He tries to eat health food at work.  He is a Naval architect.   He has is on her levels checked through our Coumadin clinic. Most of his INR  levels are subtherapeutic.   Current Outpatient Prescriptions on File Prior to Visit  Medication Sig Dispense Refill  . amiodarone (PACERONE) 200 MG tablet Take 1 tablet (200 mg  total) by mouth 2 (two) times daily.  60 tablet  0  . carvedilol (COREG) 12.5 MG tablet Take 1 tablet (12.5 mg total) by mouth 2 (two) times daily.  60 tablet  0  . furosemide (LASIX) 40 MG tablet Take 1 tablet (40 mg total) by mouth 2 (two) times daily.  60 tablet  0  . glipiZIDE (GLUCOTROL) 10 MG tablet Take 1 tablet (10 mg total) by mouth 2 (two) times daily before a meal.  60 tablet  0  . Insulin Detemir (LEVEMIR) 100 UNIT/ML Pen Inject 40 Units into the skin daily at 10 pm.  15 mL  0  . Insulin Pen Needle (PEN NEEDLES) 31G X 6 MM MISC Use as directed  50 each  0  . lisinopril (PRINIVIL,ZESTRIL) 10 MG tablet Take 1 tablet (10 mg total) by mouth daily.  30 tablet  0  . metFORMIN (GLUCOPHAGE) 500 MG tablet Take 1 tablet (500 mg total) by mouth 2 (two) times daily with a meal.  60 tablet  0  . Potassium Chloride ER 20 MEQ TBCR Take 40 mEq by mouth daily.  60 tablet  0  . spironolactone (ALDACTONE) 25 MG tablet Take 1 tablet (25 mg total) by mouth daily.  30 tablet  0  . warfarin (COUMADIN) 5 MG tablet Take as directed by  Anticoagulation clinic  60 tablet  0   No current facility-administered medications on file prior to visit.    No Known Allergies  Past Medical History  Diagnosis Date  . Hypertension   . Type II or unspecified type diabetes mellitus without mention of complication, uncontrolled   . 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  cath 2004 negative for obstruction, last echo 07/2011 EF 35%. b) Normal coronary arteries by cath in 2004 (when EF was noted at 15%). c) Last EF 20% by echo 12/2012  . History of chicken pox   . OSA (obstructive sleep apnea)   . Mitral regurgitation     Moderate by echo 07/2011  . ED (erectile dysfunction)   . Atrial fibrillation with RVR 10/2011    lone episode, converted with IV dilt, on coumadin given elevated CHADS2  . Warfarin anticoagulation     Past Surgical History   Procedure Laterality Date  . Hernia repair  2002  . Knee surgery      L knee in HS  . Cardiac catheterization  2004    no blockages  . [place] echocardiography  07/2011    mod LVH, EF 35%, diffuse hypokinesis of entire myocardium, irreversible restrictive pattern (grade 4 diastolic dysfunction), mod MR, mod dilated LA/RA, decreased RV systolic fxn, no PFO  . [place] echocardiography  12/2012    severe LVH, EF 20-25%, mod MR, reduced RV systolic function    History  Smoking status  . Never Smoker   Smokeless tobacco  . Never Used    History  Alcohol Use  . Yes    Comment: Occasional 1x/mo    Family History  Problem Relation Age of Onset  . Diabetes Mother   . Hypertension Father   . Stroke Neg Hx   . Coronary artery disease Neg Hx   . Cancer Neg Hx     Reviw of Systems:  Reviewed in the HPI.  All other systems are negative.  Physical Exam: Blood pressure 170/116, pulse 80, height 5\' 11"  (1.803 m), weight 227 lb (102.967 kg). General: Well developed, well nourished, in no acute distress.  Head: Normocephalic, atraumatic, sclera non-icteric, mucus membranes are moist,   Neck: Supple. Carotids are 2 + without bruits. No JVD  Lungs: Clear bilaterally to auscultation.  Heart: His heart is irregularly irregular. He is very tachycardic.Marland Kitchen.  normal  S1 S2. No murmurs, gallops or rubs.  Abdomen: Soft, non-tender, non-distended with normal bowel sounds. No hepatomegaly. No rebound/guarding. No masses.  Msk:  Strength and tone are normal  Extremities: No clubbing or cyanosis. No edema.  Distal pedal pulses are 2+ and equal bilaterally.  Neuro: Alert and oriented X 3. Moves all extremities spontaneously.  Psych:  Responds to questions appropriately with a normal affect.  ECG:  Assessment / Plan:

## 2013-09-20 ENCOUNTER — Other Ambulatory Visit: Payer: Self-pay

## 2013-09-20 ENCOUNTER — Other Ambulatory Visit: Payer: Self-pay | Admitting: Cardiovascular Disease

## 2013-09-20 ENCOUNTER — Other Ambulatory Visit: Payer: Self-pay | Admitting: Internal Medicine

## 2013-09-20 MED ORDER — FUROSEMIDE 40 MG PO TABS: ORAL_TABLET | ORAL | Status: DC

## 2013-10-01 ENCOUNTER — Telehealth: Payer: Self-pay | Admitting: Cardiovascular Disease

## 2013-10-01 NOTE — Telephone Encounter (Signed)
New message     Refill warfarin------[facility] [address]

## 2013-10-01 NOTE — Telephone Encounter (Signed)
Returned call to pt, missed last INR check on 09/20/13, overdue for f/u needs OV for refill.  LMOM TCB for appt.

## 2013-10-02 ENCOUNTER — Other Ambulatory Visit: Payer: Self-pay | Admitting: Cardiovascular Disease

## 2013-10-02 MED ORDER — WARFARIN SODIUM 5 MG PO TABS: ORAL_TABLET | ORAL | Status: DC

## 2013-10-02 NOTE — Telephone Encounter (Signed)
Patient called back and we discussed the need for an appointment. Appointment scheduled for tomorrow, 15 tablets sent in to pharmacy.

## 2013-10-03 ENCOUNTER — Telehealth: Payer: Self-pay

## 2013-10-03 NOTE — Telephone Encounter (Signed)
Pt said has been out of glipizide, metformin and levemir for 1-2 weeks. Pt has not been checking blood sugar; pt just ate chicken, salad and pasta with sweet tea and water and decided to ck BS since has not had diabetic meds in a while Pts BS now is 410 after eating. Pt is asymptomatic;pt has not seen Dr Reece Agar since 08/29/2011; pt said ED doctors have been refilling his meds.Pt already scheduled to see Nicki Reaper NP on 10/04/13 at 8:15 am. Spoke with Dr Para March and advised to drink some water (pt has CHF) to keep urine as clear as possible; not dark urine and if pt becomes symptomatic or his condition changes or worsens pt should go to ED tonight. Pt voiced understanding.

## 2013-10-04 ENCOUNTER — Encounter: Payer: Self-pay | Admitting: Internal Medicine

## 2013-10-04 ENCOUNTER — Telehealth: Payer: Self-pay | Admitting: Family Medicine

## 2013-10-04 ENCOUNTER — Ambulatory Visit (INDEPENDENT_AMBULATORY_CARE_PROVIDER_SITE_OTHER): Payer: BC Managed Care – PPO | Admitting: Internal Medicine

## 2013-10-04 VITALS — BP 154/106 | HR 71 | Temp 98.5°F | Wt 223.2 lb

## 2013-10-04 DIAGNOSIS — I10 Essential (primary) hypertension: Secondary | ICD-10-CM

## 2013-10-04 DIAGNOSIS — E1165 Type 2 diabetes mellitus with hyperglycemia: Secondary | ICD-10-CM

## 2013-10-04 DIAGNOSIS — E119 Type 2 diabetes mellitus without complications: Secondary | ICD-10-CM

## 2013-10-04 DIAGNOSIS — IMO0001 Reserved for inherently not codable concepts without codable children: Secondary | ICD-10-CM

## 2013-10-04 LAB — LIPID PANEL
CHOL/HDL RATIO: 4
CHOLESTEROL: 150 mg/dL (ref 0–200)
HDL: 34.9 mg/dL — ABNORMAL LOW (ref >39.00)
LDL CALC: 95 mg/dL (ref 0–99)
Triglycerides: 101 mg/dL (ref 0.0–149.0)
VLDL: 20.2 mg/dL (ref 0.0–40.0)

## 2013-10-04 LAB — MICROALBUMIN / CREATININE URINE RATIO
CREATININE, U: 154.2 mg/dL
MICROALB UR: 5.1 mg/dL — AB (ref 0.0–1.9)
MICROALB/CREAT RATIO: 3.3 mg/g (ref 0.0–30.0)

## 2013-10-04 LAB — CBC
HCT: 38.8 % — ABNORMAL LOW (ref 39.0–52.0)
Hemoglobin: 12.7 g/dL — ABNORMAL LOW (ref 13.0–17.0)
MCHC: 32.6 g/dL (ref 30.0–36.0)
MCV: 92.3 fl (ref 78.0–100.0)
PLATELETS: 196 10*3/uL (ref 150.0–400.0)
RBC: 4.2 Mil/uL — AB (ref 4.22–5.81)
RDW: 13.2 % (ref 11.5–14.6)
WBC: 5.1 10*3/uL (ref 4.5–10.5)

## 2013-10-04 LAB — POCT URINALYSIS DIPSTICK
Bilirubin, UA: NEGATIVE
Ketones, UA: NEGATIVE
Leukocytes, UA: NEGATIVE
NITRITE UA: NEGATIVE
RBC UA: NEGATIVE
SPEC GRAV UA: 1.015
UROBILINOGEN UA: 0.2
pH, UA: 6

## 2013-10-04 LAB — COMPREHENSIVE METABOLIC PANEL
ALBUMIN: 3.5 g/dL (ref 3.5–5.2)
ALK PHOS: 40 U/L (ref 39–117)
ALT: 21 U/L (ref 0–53)
AST: 18 U/L (ref 0–37)
BUN: 25 mg/dL — ABNORMAL HIGH (ref 6–23)
CALCIUM: 8.7 mg/dL (ref 8.4–10.5)
CO2: 27 mEq/L (ref 19–32)
Chloride: 104 mEq/L (ref 96–112)
Creatinine, Ser: 1.4 mg/dL (ref 0.4–1.5)
GFR: 73.42 mL/min (ref >60.00)
Glucose, Bld: 262 mg/dL — ABNORMAL HIGH (ref 70–99)
POTASSIUM: 4.3 meq/L (ref 3.5–5.1)
SODIUM: 137 meq/L (ref 135–145)
TOTAL PROTEIN: 7.2 g/dL (ref 6.0–8.3)
Total Bilirubin: 1.1 mg/dL (ref 0.3–1.2)

## 2013-10-04 LAB — HEMOGLOBIN A1C: Hgb A1c MFr Bld: 7.9 % — ABNORMAL HIGH (ref 4.6–6.5)

## 2013-10-04 MED ORDER — METFORMIN HCL 500 MG PO TABS: 500.0000 mg | ORAL_TABLET | Freq: Two times a day (BID) | ORAL | Status: DC

## 2013-10-04 MED ORDER — GLIPIZIDE 10 MG PO TABS: 10.0000 mg | ORAL_TABLET | Freq: Two times a day (BID) | ORAL | Status: DC

## 2013-10-04 MED ORDER — INSULIN DETEMIR 100 UNIT/ML FLEXPEN: 40.0000 [IU] | PEN_INJECTOR | Freq: Every day | SUBCUTANEOUS | Status: DC

## 2013-10-04 NOTE — Telephone Encounter (Signed)
Patient would like to change PCP from Dr. Sharen Hones to Nicki Reaper NP, please advise as to your wishes to this request.

## 2013-10-04 NOTE — Progress Notes (Signed)
Subjective:    Patient ID: Johnathan Archer, male    DOB: 1970-07-02, 43 y.o.   MRN: [id number]  HPI  Pt presents to the clinic today with c/o elevated blood sugar. He reports that he has been out of his medications for 2 weeks. He takes levemir, glucophage, and glipizide. He does not check his sugars regularly. He checked it last night and he had a ready of 410. He denies dizziness, sweatiness, jitteriness, or felling like he is going to pass out. He has not been seen in this office for over 2 years. He has been getting his medications from the ER.  Review of Systems      Past Medical History  Diagnosis Date  . Hypertension   . Type II or unspecified type diabetes mellitus without mention of complication, uncontrolled   . 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  cath 2004 negative for obstruction, last echo 07/2011 EF 35%. b) Normal coronary arteries by cath in 2004 (when EF was noted at 15%). c) Last EF 20% by echo 12/2012  . History of chicken pox   . OSA (obstructive sleep apnea)   . Mitral regurgitation     Moderate by echo 07/2011  . ED (erectile dysfunction)   . Atrial fibrillation with RVR 10/2011    lone episode, converted with IV dilt, on coumadin given elevated CHADS2  . Warfarin anticoagulation     Current Outpatient Prescriptions  Medication Sig Dispense Refill  . amiodarone (PACERONE) 200 MG tablet Take 1 tablet (200 mg total) by mouth daily.  30 tablet  6  . carvedilol (COREG) 12.5 MG tablet Take 1 tablet (12.5 mg total) by mouth 2 (two) times daily.  60 tablet  6  . furosemide (LASIX) 40 MG tablet TAKE ONE TABLET BY MOUTH TWICE DAILY  180 tablet  1  . glipiZIDE (GLUCOTROL) 10 MG tablet Take 1 tablet (10 mg total) by mouth 2 (two) times daily before a meal.  60 tablet  0  . Insulin Detemir (LEVEMIR) 100 UNIT/ML Pen Inject 40 Units into the skin daily at 10 pm.  15 mL  0  . Insulin Pen  Needle (PEN NEEDLES) 31G X 6 MM MISC Use as directed  50 each  0  . lisinopril (PRINIVIL,ZESTRIL) 10 MG tablet Take 1 tablet (10 mg total) by mouth daily.  30 tablet  6  . metFORMIN (GLUCOPHAGE) 500 MG tablet Take 1 tablet (500 mg total) by mouth 2 (two) times daily with a meal.  60 tablet  0  . Potassium Chloride ER 20 MEQ TBCR Take 40 mEq by mouth daily.  60 tablet  6  . sildenafil (VIAGRA) 100 MG tablet Take 1 tablet (100 mg total) by mouth daily as needed for erectile dysfunction.  15 tablet  0  . spironolactone (ALDACTONE) 25 MG tablet Take 1 tablet (25 mg total) by mouth daily.  30 tablet  6  . warfarin (COUMADIN) 5 MG tablet Take as directed by Anticoagulation clinic  15 tablet  0   No current facility-administered medications for this visit.    No Known Allergies  Family History  Problem Relation Age of Onset  . Diabetes Mother   . Hypertension Father   . Stroke Neg Hx   . Coronary artery disease Neg Hx   . Cancer Neg Hx     History   Social History  . Marital Status: Married  Spouse Name: [name]    Number of Children: [name]  . Years of Education: [name]   Occupational History  . Not on file.   Social History Main Topics  . Smoking status: Never Smoker   . Smokeless tobacco: Never Used  . Alcohol Use: Yes     Comment: Occasional 1x/mo  . Drug Use: No  . Sexual Activity: Not on file   Other Topics Concern  . Not on file   Social History Narrative   Caffeine: none   Lives with wife and daughter (8yo), no pets   Occupation: Naval architectestaurant Manager   Edu: [employer]yr college   Activity: works, no regular activity   Diet: water daily, tries fruits/vegetables daily     Constitutional: Denies fever, malaise, fatigue, headache or abrupt weight changes.    Cardiovascular: Denies chest pain, chest tightness, palpitations or swelling in the hands or feet.  Gastrointestinal: Denies abdominal pain, bloating, constipation, d Skin: Denies redness, rashes, lesions or ulcercations.      No other specific complaints in a complete review of systems (except as listed in HPI above).  Objective:   Physical Exam  BP 154/106  Pulse 71  Temp(Src) 98.5 F (36.9 C) (Oral)  Wt 223 lb 4 oz (101.266 kg)  SpO2 98% Wt Readings from Last 3 Encounters:  10/04/13 223 lb 4 oz (101.266 kg)  09/13/13 227 lb (102.967 kg)  07/07/13 225 lb 13.4 oz (102.44 kg)    General: Appears his stated age, well developed, well nourished in NAD. Skin: Warm, dry and intact. No rashes, lesions or ulcerations noted. Cardiovascular: Normal rate and rhythm. S1,S2 noted.  No murmur, rubs or gallops noted. No JVD or BLE edema. No carotid bruits noted. Pulmonary/Chest: Normal effort and positive vesicular breath sounds. No respiratory distress. No wheezes, rales or ronchi noted.  Abdomen: Soft and nontender. Normal bowel sounds, no bruits noted. No distention or masses noted. Liver, spleen and kidneys non palpable.  BMET    Component Value Date/Time   NA 136* 07/07/2013 0400   K 3.6* 07/07/2013 0400   CL 99 07/07/2013 0400   CO2 25 07/07/2013 0400   GLUCOSE 234* 07/07/2013 0400   BUN 16 07/07/2013 0400   CREATININE 1.00 07/07/2013 0400   CALCIUM 8.0* 07/07/2013 0400   GFRNONAA >90 07/07/2013 0400   GFRAA >90 07/07/2013 0400    Lipid Panel     Component Value Date/Time   CHOL 119 06/28/2011 1050   TRIG 102.0 06/28/2011 1050   HDL 47.40 06/28/2011 1050   CHOLHDL 3 06/28/2011 1050   VLDL 20.4 06/28/2011 1050   LDLCALC 51 06/28/2011 1050    CBC    Component Value Date/Time   WBC 4.8 07/07/2013 0400   RBC 4.04* 07/07/2013 0400   HGB 12.4* 07/07/2013 0400   HCT 35.5* 07/07/2013 0400   PLT 195 07/07/2013 0400   MCV 87.9 07/07/2013 0400   MCH 30.7 07/07/2013 0400   MCHC 34.9 07/07/2013 0400   RDW 12.5 07/07/2013 0400   LYMPHSABS 1.6 07/05/2013 1338   MONOABS 0.4 07/05/2013 1338   EOSABS 0.1 07/05/2013 1338   BASOSABS 0.0 07/05/2013 1338    Hgb A1C Lab Results  Component Value Date   HGBA1C 13.7* 07/05/2013          Assessment & Plan:

## 2013-10-04 NOTE — Patient Instructions (Addendum)
Type 2 Diabetes Mellitus, Adult Type 2 diabetes mellitus, often simply referred to as type 2 diabetes, is a long-lasting (chronic) disease. In type 2 diabetes, the pancreas does not make enough insulin (a hormone), the cells are less responsive to the insulin that is made (insulin resistance), or both. Normally, insulin moves sugars from food into the tissue cells. The tissue cells use the sugars for energy. The lack of insulin or the lack of normal response to insulin causes excess sugars to build up in the blood instead of going into the tissue cells. As a result, high blood sugar (hyperglycemia) develops. The effect of high sugar (glucose) levels can cause many complications. Type 2 diabetes was also previously called adult-onset diabetes but it can occur at any age.  RISK FACTORS  A person is predisposed to developing type 2 diabetes if someone in the family has the disease and also has one or more of the following primary risk factors:  Overweight.  An inactive lifestyle.  A history of consistently eating high-calorie foods. Maintaining a normal weight and regular physical activity can reduce the chance of developing type 2 diabetes. SYMPTOMS  A person with type 2 diabetes may not show symptoms initially. The symptoms of type 2 diabetes appear slowly. The symptoms include:  Increased thirst (polydipsia).  Increased urination (polyuria).  Increased urination during the night (nocturia).  Weight loss. This weight loss may be rapid.  Frequent, recurring infections.  Tiredness (fatigue).  Weakness.  Vision changes, such as blurred vision.  Fruity smell to your breath.  Abdominal pain.  Nausea or vomiting.  Cuts or bruises which are slow to heal.  Tingling or numbness in the hands or feet. DIAGNOSIS Type 2 diabetes is frequently not diagnosed until complications of diabetes are present. Type 2 diabetes is diagnosed when symptoms or complications are present and when blood  glucose levels are increased. Your blood glucose level may be checked by one or more of the following blood tests:  A fasting blood glucose test. You will not be allowed to eat for at least 8 hours before a blood sample is taken.  A random blood glucose test. Your blood glucose is checked at any time of the day regardless of when you ate.  A hemoglobin A1c blood glucose test. A hemoglobin A1c test provides information about blood glucose control over the previous 3 months.  An oral glucose tolerance test (OGTT). Your blood glucose is measured after you have not eaten (fasted) for 2 hours and then after you drink a glucose-containing beverage. TREATMENT   You may need to take insulin or diabetes medicine daily to keep blood glucose levels in the desired range.  You will need to match insulin dosing with exercise and healthy food choices. The treatment goal is to maintain the before meal blood sugar (preprandial glucose) level at 70 130 mg/dL. HOME CARE INSTRUCTIONS   Have your hemoglobin A1c level checked twice a year.  Perform daily blood glucose monitoring as directed by your caregiver.  Monitor urine ketones when you are ill and as directed by your caregiver.  Take your diabetes medicine or insulin as directed by your caregiver to maintain your blood glucose levels in the desired range.  Never run out of diabetes medicine or insulin. It is needed every day.  Adjust insulin based on your intake of carbohydrates. Carbohydrates can raise blood glucose levels but need to be included in your diet. Carbohydrates provide vitamins, minerals, and fiber which are an essential part of   a healthy diet. Carbohydrates are found in fruits, vegetables, whole grains, dairy products, legumes, and foods containing added sugars.    Eat healthy foods. Alternate 3 meals with 3 snacks.  Lose weight if overweight.  Carry a medical alert card or wear your medical alert jewelry.  Carry a 15 gram  carbohydrate snack with you at all times to treat low blood glucose (hypoglycemia). Some examples of 15 gram carbohydrate snacks include:  Glucose tablets, 3 or 4   Glucose gel, 15 gram tube  Raisins, 2 tablespoons (24 grams)  Jelly beans, 6  Animal crackers, 8  Regular pop, 4 ounces (120 mL)  Gummy treats, 9  Recognize hypoglycemia. Hypoglycemia occurs with blood glucose levels of 70 mg/dL and below. The risk for hypoglycemia increases when fasting or skipping meals, during or after intense exercise, and during sleep. Hypoglycemia symptoms can include:  Tremors or shakes.  Decreased ability to concentrate.  Sweating.  Increased heart rate.  Headache.  Dry mouth.  Hunger.  Irritability.  Anxiety.  Restless sleep.  Altered speech or coordination.  Confusion.  Treat hypoglycemia promptly. If you are alert and able to safely swallow, follow the 15:15 rule:  Take 15 20 grams of rapid-acting glucose or carbohydrate. Rapid-acting options include glucose gel, glucose tablets, or 4 ounces (120 mL) of fruit juice, regular soda, or low fat milk.  Check your blood glucose level 15 minutes after taking the glucose.  Take 15 20 grams more of glucose if the repeat blood glucose level is still 70 mg/dL or below.  Eat a meal or snack within 1 hour once blood glucose levels return to normal.    Be alert to polyuria and polydipsia which are early signs of hyperglycemia. An early awareness of hyperglycemia allows for prompt treatment. Treat hyperglycemia as directed by your caregiver.  Engage in at least 150 minutes of moderate-intensity physical activity a week, spread over at least 3 days of the week or as directed by your caregiver. In addition, you should engage in resistance exercise at least 2 times a week or as directed by your caregiver.  Adjust your medicine and food intake as needed if you start a new exercise or sport.  Follow your sick day plan at any time you  are unable to eat or drink as usual.  Avoid tobacco use.  Limit alcohol intake to no more than 1 drink per day for nonpregnant women and 2 drinks per day for men. You should drink alcohol only when you are also eating food. Talk with your caregiver whether alcohol is safe for you. Tell your caregiver if you drink alcohol several times a week.  Follow up with your caregiver regularly.  Schedule an eye exam soon after the diagnosis of type 2 diabetes and then annually.  Perform daily skin and foot care. Examine your skin and feet daily for cuts, bruises, redness, nail problems, bleeding, blisters, or sores. A foot exam by a caregiver should be done annually.  Brush your teeth and gums at least twice a day and floss at least once a day. Follow up with your dentist regularly.  Share your diabetes management plan with your workplace or school.  Stay up-to-date with immunizations.  Learn to manage stress.  Obtain ongoing diabetes education and support as needed.  Participate in, or seek rehabilitation as needed to maintain or improve independence and quality of life. Request a physical or occupational therapy referral if you are having foot or hand numbness or difficulties with grooming,   dressing, eating, or physical activity. SEEK MEDICAL CARE IF:   You are unable to eat food or drink fluids for more than 6 hours.  You have nausea and vomiting for more than 6 hours.  Your blood glucose level is over 240 mg/dL.  There is a change in mental status.  You develop an additional serious illness.  You have diarrhea for more than 6 hours.  You have been sick or have had a fever for a couple of days and are not getting better.  You have pain during any physical activity.  SEEK IMMEDIATE MEDICAL CARE IF:  You have difficulty breathing.  You have moderate to large ketone levels. MAKE SURE YOU:  Understand these instructions.  Will watch your condition.  Will get help right away if  you are not doing well or get worse. Document Released: 06/06/2005 Document Revised: 02/29/2012 Document Reviewed: 01/03/2012 [employer] Patient Information 2014 [employer].  

## 2013-10-04 NOTE — Assessment & Plan Note (Signed)
Blood sugar 276 by fingerstick today Urinalysis: > 2000 glucose, no ketones Will refill metformin, glipizide and levemir You need to be checking your blood sugars at least twice daily Work on diet and exercise Foot exam today Will check lipids, A1C, and microalbumin  You need to follow up in 1 month with a sugar log so that we can adjust your medication to get your A1C down

## 2013-10-04 NOTE — Telephone Encounter (Signed)
Ok with me as long as he is planning on being compliant with care

## 2013-10-04 NOTE — Progress Notes (Signed)
Pre visit review using our clinic review tool, if applicable. No additional management support is needed unless otherwise documented below in the visit note. 

## 2013-10-04 NOTE — Addendum Note (Signed)
Addended by: Roena Malady on: 10/04/2013 04:36 PM   Modules accepted: Orders

## 2013-10-07 ENCOUNTER — Telehealth: Payer: Self-pay | Admitting: Family Medicine

## 2013-10-07 NOTE — Telephone Encounter (Signed)
Relevant patient education assigned to patient using Emmi. ° °

## 2013-10-09 NOTE — Progress Notes (Signed)
This encounter was created in error - please disregard.

## 2013-10-10 ENCOUNTER — Telehealth: Payer: Self-pay

## 2013-10-10 NOTE — Telephone Encounter (Signed)
lmovm for pt to return call in reference to his lab results

## 2013-10-11 NOTE — Telephone Encounter (Signed)
Ok by me.  i have not seen in >2 yrs - h/o nonadherence to medical treatment plan.

## 2013-10-16 ENCOUNTER — Telehealth: Payer: Self-pay

## 2013-10-16 NOTE — Telephone Encounter (Signed)
Relevant patient education assigned to patient using Emmi. ° °

## 2013-11-04 ENCOUNTER — Ambulatory Visit: Payer: BC Managed Care – PPO | Admitting: Internal Medicine

## 2013-11-04 DIAGNOSIS — Z0289 Encounter for other administrative examinations: Secondary | ICD-10-CM

## 2013-11-05 ENCOUNTER — Ambulatory Visit (INDEPENDENT_AMBULATORY_CARE_PROVIDER_SITE_OTHER): Payer: BC Managed Care – PPO | Admitting: *Deleted

## 2013-11-05 DIAGNOSIS — I4891 Unspecified atrial fibrillation: Secondary | ICD-10-CM

## 2013-11-05 DIAGNOSIS — Z7901 Long term (current) use of anticoagulants: Secondary | ICD-10-CM

## 2013-11-05 LAB — POCT INR: INR: 2.9

## 2013-11-05 MED ORDER — WARFARIN SODIUM 5 MG PO TABS: ORAL_TABLET | ORAL | Status: DC

## 2013-11-28 ENCOUNTER — Encounter: Payer: Self-pay | Admitting: Cardiovascular Disease

## 2013-11-28 ENCOUNTER — Ambulatory Visit (INDEPENDENT_AMBULATORY_CARE_PROVIDER_SITE_OTHER): Payer: BC Managed Care – PPO | Admitting: Cardiovascular Disease

## 2013-11-28 ENCOUNTER — Ambulatory Visit (INDEPENDENT_AMBULATORY_CARE_PROVIDER_SITE_OTHER): Payer: BC Managed Care – PPO | Admitting: *Deleted

## 2013-11-28 ENCOUNTER — Encounter (INDEPENDENT_AMBULATORY_CARE_PROVIDER_SITE_OTHER): Payer: Self-pay

## 2013-11-28 VITALS — BP 182/110 | HR 66 | Ht 71.0 in | Wt 219.0 lb

## 2013-11-28 DIAGNOSIS — Z7901 Long term (current) use of anticoagulants: Secondary | ICD-10-CM

## 2013-11-28 DIAGNOSIS — I4891 Unspecified atrial fibrillation: Secondary | ICD-10-CM

## 2013-11-28 DIAGNOSIS — I420 Dilated cardiomyopathy: Secondary | ICD-10-CM

## 2013-11-28 DIAGNOSIS — I428 Other cardiomyopathies: Secondary | ICD-10-CM

## 2013-11-28 LAB — BASIC METABOLIC PANEL
BUN: 17 mg/dL (ref 6–23)
CO2: 27 meq/L (ref 19–32)
Calcium: 9.3 mg/dL (ref 8.4–10.5)
Chloride: 101 mEq/L (ref 96–112)
Creatinine, Ser: 1.3 mg/dL (ref 0.4–1.5)
GFR: 78.68 mL/min (ref >60.00)
Glucose, Bld: 320 mg/dL — ABNORMAL HIGH (ref 70–99)
POTASSIUM: 4 meq/L (ref 3.5–5.1)
SODIUM: 137 meq/L (ref 135–145)

## 2013-11-28 LAB — HEPATIC FUNCTION PANEL
ALT: 27 U/L (ref 0–53)
AST: 21 U/L (ref 0–37)
Albumin: 3.8 g/dL (ref 3.5–5.2)
Alkaline Phosphatase: 49 U/L (ref 39–117)
BILIRUBIN DIRECT: 0.1 mg/dL (ref 0.0–0.3)
BILIRUBIN TOTAL: 0.7 mg/dL (ref 0.2–1.2)
Total Protein: 8 g/dL (ref 6.0–8.3)

## 2013-11-28 LAB — LIPID PANEL
CHOL/HDL RATIO: 4
Cholesterol: 177 mg/dL (ref 0–200)
HDL: 43.5 mg/dL (ref >39.00)
LDL CALC: 101 mg/dL — AB (ref 0–99)
NonHDL: 133.5
Triglycerides: 163 mg/dL — ABNORMAL HIGH (ref 0.0–149.0)
VLDL: 32.6 mg/dL (ref 0.0–40.0)

## 2013-11-28 LAB — TSH: TSH: 1.21 u[IU]/mL (ref 0.35–4.50)

## 2013-11-28 LAB — POCT INR: INR: 2.5

## 2013-11-28 MED ORDER — POTASSIUM CHLORIDE ER 20 MEQ PO TBCR: 40.0000 meq | EXTENDED_RELEASE_TABLET | Freq: Every day | ORAL | Status: DC

## 2013-11-28 MED ORDER — LISINOPRIL 10 MG PO TABS
10.0000 mg | ORAL_TABLET | Freq: Every day | ORAL | Status: DC
Start: 2013-11-28 — End: 2013-11-28

## 2013-11-28 MED ORDER — AMIODARONE HCL 200 MG PO TABS: 200.0000 mg | ORAL_TABLET | Freq: Every day | ORAL | Status: DC

## 2013-11-28 MED ORDER — LISINOPRIL 10 MG PO TABS: 10.0000 mg | ORAL_TABLET | Freq: Every day | ORAL | Status: DC

## 2013-11-28 MED ORDER — AMIODARONE HCL 200 MG PO TABS
200.0000 mg | ORAL_TABLET | Freq: Every day | ORAL | Status: DC
Start: 2013-11-28 — End: 2013-11-28

## 2013-11-28 MED ORDER — CARVEDILOL 12.5 MG PO TABS: 12.5000 mg | ORAL_TABLET | Freq: Two times a day (BID) | ORAL | Status: DC

## 2013-11-28 NOTE — Patient Instructions (Addendum)
Your physician recommends that you continue on your current medications as directed. Please refer to the Current Medication list given to you today.  Your medications have been refilled today.  Lab Today: Bmet, Tsh, Lft, lipid  Your physician wants you to follow-up in: 6 months You will receive a reminder letter in the mail two months in advance. If you don't receive a letter, please call our office to schedule the follow-up appointment.      Sodium-Controlled Diet Sodium is a mineral. It is found in many foods. Sodium may be found naturally or added during the making of a food. The most common form of sodium is salt, which is made up of sodium and chloride. Reducing your sodium intake involves changing your eating habits. The following guidelines will help you reduce the sodium in your diet:  Stop using the salt shaker.  Use salt sparingly in cooking and baking.  Substitute with sodium-free seasonings and spices.  Do not use a salt substitute (potassium chloride) without your caregiver's permission.  Include a variety of fresh, unprocessed foods in your diet.  Limit the use of processed and convenience foods that are high in sodium. USE THE FOLLOWING FOODS SPARINGLY: Breads/Starches  Commercial bread stuffing, commercial pancake or waffle mixes, coating mixes. Waffles. Croutons. Prepared (boxed or frozen) potato, rice, or noodle mixes that contain salt or sodium. Salted [place] fries or hash browns. Salted popcorn, breads, crackers, chips, or snack foods. Vegetables  Vegetables canned with salt or prepared in cream, butter, or cheese sauces. Sauerkraut. Tomato or vegetable juices canned with salt.  Fresh vegetables are allowed if rinsed thoroughly. Fruit  Fruit is okay to eat. Meat and Meat Substitutes  Salted or smoked meats, such as bacon or Canadian bacon, chipped or corned beef, hot dogs, salt pork, luncheon meats, pastrami, ham, or sausage. Canned or smoked fish,  poultry, or meat. Processed cheese or cheese spreads, blue or Roquefort cheese. Battered or frozen fish products. Prepared spaghetti sauce. Baked beans. Reuben sandwiches. Salted nuts. Caviar. Milk  Limit buttermilk to 1 cup per week. Soups and Combination Foods  Bouillon cubes, canned or dried soups, broth, consomm. Convenience (frozen or packaged) dinners with more than 600 mg sodium. Pot pies, pizza, Asian food, fast food cheeseburgers, and specialty sandwiches. Desserts and Sweets  Regular (salted) desserts, pie, commercial fruit snack pies, commercial snack cakes, canned puddings.  Eat desserts and sweets in moderation. Fats and Oils  Gravy mixes or canned gravy. No more than 1 to 2 tbs of salad dressing. Chip dips.  Eat fats and oils in moderation. Beverages  See those listed under the vegetables and milk groups. Condiments  Ketchup, mustard, meat sauces, salsa, regular (salted) and lite soy sauce or mustard. Dill pickles, olives, meat tenderizer. Prepared horseradish or pickle relish. Dutch-processed cocoa. Baking powder or baking soda used medicinally. Worcestershire sauce. "Light" salt. Salt substitute, unless approved by your caregiver. Document Released: 11/26/2001 Document Revised: 08/29/2011 Document Reviewed: 06/29/2009 [facility] Patient Information 2014 [place], [place].  You will receive a reminder letter in the mail two months in advance. If you don't receive a letter, please call our office to schedule the follow-up appointment.

## 2013-11-28 NOTE — Assessment & Plan Note (Signed)
Remains in normal rhythm. We'll check labs today including a TSH. His last TSH was on the low side of normal.

## 2013-11-28 NOTE — Assessment & Plan Note (Signed)
Compliance has been an issue. He's run out of his medications again. We had long discussion regarding the absolute need to continue taking his medications on arrival basis. We've discussed salt restriction.  I told him that it is impossible for [place] to make progress of his heart failure he keeps running out of his medications. I suggested that he transfer his medications to a 24 hr pharmacy.

## 2013-11-28 NOTE — Progress Notes (Signed)
[name] Date of Birth  May 17, 1971 [facility]     [place] Office  [address]    [address]   [address] [place], [place]  [address]    [place], [place]  [address] [phone]  Fax  [phone]  [phone]  Fax [phone]  Problems: 1. Congestive heart failure, EF equals 35%, nonobstructive coronary artery disease by cath in 2004 ( former patient of Donnie Aho) 2. Hypertension 3. Diabetes mellitus-type II 4. Sleep apnea 5. Atrial fibrillation   History of Present Illness:  [place] to 43 year old gentleman with a history of idiopathic dilated cardiomyopathy since 2004. He had a cardiac catheterization at that time which revealed nonobstructive coronary artery disease.  He's been on medical therapy since that time.  Recently he's noticed lots of fatigue and also lots of leg swelling.  He does avoid eating fried foods or salty foods.  December 31, 2012:  [name] presents with atrial fib with RVR.  He was diagnosed in May 2013.  He typically runs out of his meds - ran out of coreg 3 days ago.  He has not been to coumadin clinic.  He takes 1 coumadin a day ( 5 mg).  Last INR is 1.27.    He has DOE, extreme fatigue.    He tries to avoid salt.  He has been forcing down lots of water thinking that he is helping himself.  He has been to the [facility].    September 13, 2013:  [place] has been doing well. He's been getting some regular exercise. He recently joined a gym.     He missed his medicines yesterday. As a result on his blood pressure is fairly high today.  He still eats some canned foods.  He tries to eat health food at work.  He is a Naval architect.   He has is on her levels checked through our Coumadin clinic. Most of his INR  levels are subtherapeutic.  November 28, 2013:  He has run out of his meds - AGAIN.  BP is high.  Doing well.  Some dyspnea. Sleeping OK His has A-fib - is on coumadin.  Has joined a  gym - losing some weight.   Has some ED.    Current Outpatient Prescriptions on File Prior to Visit  Medication Sig Dispense Refill  . amiodarone (PACERONE) 200 MG tablet Take 1 tablet (200 mg total) by mouth daily.  30 tablet  6  . carvedilol (COREG) 12.5 MG tablet Take 1 tablet (12.5 mg total) by mouth 2 (two) times daily.  60 tablet  6  . furosemide (LASIX) 40 MG tablet TAKE ONE TABLET BY MOUTH TWICE DAILY  180 tablet  1  . glipiZIDE (GLUCOTROL) 10 MG tablet Take 1 tablet (10 mg total) by mouth 2 (two) times daily before a meal.  60 tablet  0  . Insulin Detemir (LEVEMIR) 100 UNIT/ML Pen Inject 40 Units into the skin daily at 10 pm.  15 mL  0  . Insulin Pen Needle (PEN NEEDLES) 31G X 6 MM MISC Use as directed  50 each  0  . lisinopril (PRINIVIL,ZESTRIL) 10 MG tablet Take 1 tablet (10 mg total) by mouth daily.  30 tablet  6  . metFORMIN (GLUCOPHAGE) 500 MG tablet Take 1 tablet (500 mg total) by mouth 2 (two) times daily with a meal.  60 tablet  0  . Potassium Chloride ER 20 MEQ TBCR Take 40 mEq  by mouth daily.  60 tablet  6  . sildenafil (VIAGRA) 100 MG tablet Take 1 tablet (100 mg total) by mouth daily as needed for erectile dysfunction.  15 tablet  0  . spironolactone (ALDACTONE) 25 MG tablet Take 1 tablet (25 mg total) by mouth daily.  30 tablet  6  . warfarin (COUMADIN) 5 MG tablet Take as directed by Anticoagulation clinic  60 tablet  0   No current facility-administered medications on file prior to visit.    No Known Allergies  Past Medical History  Diagnosis Date  . Hypertension   . Type II or unspecified type diabetes mellitus without mention of complication, uncontrolled   . 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  cath 2004 negative for obstruction, last echo 07/2011 EF 35%. b) Normal coronary arteries by cath in 2004 (when EF was noted at 15%). c) Last EF 20% by echo 12/2012  . History of chicken pox   .  OSA (obstructive sleep apnea)   . Mitral regurgitation     Moderate by echo 07/2011  . ED (erectile dysfunction)   . Atrial fibrillation with RVR 10/2011    lone episode, converted with IV dilt, on coumadin given elevated CHADS2  . Warfarin anticoagulation     Past Surgical History  Procedure Laterality Date  . Hernia repair  2002  . Knee surgery      L knee in HS  . Cardiac catheterization  2004    no blockages  . [place] echocardiography  07/2011    mod LVH, EF 35%, diffuse hypokinesis of entire myocardium, irreversible restrictive pattern (grade 4 diastolic dysfunction), mod MR, mod dilated LA/RA, decreased RV systolic fxn, no PFO  . [place] echocardiography  12/2012    severe LVH, EF 20-25%, mod MR, reduced RV systolic function    History  Smoking status  . Never Smoker   Smokeless tobacco  . Never Used    History  Alcohol Use  . Yes    Comment: Occasional 1x/mo    Family History  Problem Relation Age of Onset  . Diabetes Mother   . Hypertension Father   . Stroke Neg Hx   . Coronary artery disease Neg Hx   . Cancer Neg Hx     Reviw of Systems:  Reviewed in the HPI.  All other systems are negative.  Physical Exam: Blood pressure 182/110, pulse 66, height 5\' 11"  (1.803 m), weight 219 lb (99.338 kg). General: Well developed, well nourished, in no acute distress.  Head: Normocephalic, atraumatic, sclera non-icteric, mucus membranes are moist,   Neck: Supple. Carotids are 2 + without bruits. No JVD  Lungs: Clear bilaterally to auscultation.  Heart: His heart is irregularly irregular. He is very tachycardic.Marland Kitchen.  normal  S1 S2. No murmurs, gallops or rubs.  Abdomen: Soft, non-tender, non-distended with normal bowel sounds. No hepatomegaly. No rebound/guarding. No masses.  Msk:  Strength and tone are normal  Extremities: No clubbing or cyanosis. No edema.  Distal pedal pulses are 2+ and equal bilaterally.  Neuro: Alert and oriented X 3. Moves all extremities  spontaneously.  Psych:  Responds to questions appropriately with a normal affect.  ECG:  Assessment / Plan:

## 2013-11-29 NOTE — Progress Notes (Signed)
Quick Note:  lmtcb 6/12 ______ 

## 2013-12-13 ENCOUNTER — Other Ambulatory Visit: Payer: Self-pay

## 2013-12-13 DIAGNOSIS — IMO0001 Reserved for inherently not codable concepts without codable children: Secondary | ICD-10-CM

## 2013-12-13 DIAGNOSIS — E1165 Type 2 diabetes mellitus with hyperglycemia: Principal | ICD-10-CM

## 2013-12-13 MED ORDER — METFORMIN HCL 500 MG PO TABS: 500.0000 mg | ORAL_TABLET | Freq: Two times a day (BID) | ORAL | Status: DC

## 2013-12-13 MED ORDER — INSULIN DETEMIR 100 UNIT/ML FLEXPEN: 40.0000 [IU] | PEN_INJECTOR | Freq: Every day | SUBCUTANEOUS | Status: DC

## 2013-12-13 NOTE — Telephone Encounter (Signed)
Left voicemail letting pt know Rx sent to pharmacy and to call back to schedule a f/u because Nicki Reaper, NP will not refill again until he is seen

## 2013-12-13 NOTE — Telephone Encounter (Signed)
Pt left v/m requesting refill levemir and metformin to [facility]; pt was seen 10/04/13 and was to f/u with appt in one month; pt was no show on 11/04/13.Please advise.

## 2013-12-13 NOTE — Telephone Encounter (Signed)
Ok to refill, x 1 month. He must followup for further refills.

## 2013-12-23 ENCOUNTER — Other Ambulatory Visit: Payer: Self-pay

## 2013-12-23 ENCOUNTER — Ambulatory Visit (INDEPENDENT_AMBULATORY_CARE_PROVIDER_SITE_OTHER): Payer: BC Managed Care – PPO

## 2013-12-23 DIAGNOSIS — I4891 Unspecified atrial fibrillation: Secondary | ICD-10-CM

## 2013-12-23 DIAGNOSIS — I420 Dilated cardiomyopathy: Secondary | ICD-10-CM

## 2013-12-23 DIAGNOSIS — Z7901 Long term (current) use of anticoagulants: Secondary | ICD-10-CM

## 2013-12-23 LAB — POCT INR: INR: 2

## 2013-12-23 MED ORDER — WARFARIN SODIUM 5 MG PO TABS: ORAL_TABLET | ORAL | Status: DC

## 2013-12-23 NOTE — Telephone Encounter (Signed)
Pt needs refills sent to [facility].

## 2013-12-23 NOTE — Telephone Encounter (Signed)
Pt left v/m requesting refill for a med; unable to understand name of med. I called pt and left v/m requesting pt to contact pharmacy for refill and if no refill available pharmacy will contact office for refill.

## 2013-12-30 ENCOUNTER — Telehealth: Payer: Self-pay | Admitting: *Deleted

## 2013-12-30 NOTE — Telephone Encounter (Signed)
Lm on pts vm requesting a call back to schedule DIABETIC BUNDLE OV-needs Bp check and LDL

## 2014-01-15 ENCOUNTER — Other Ambulatory Visit: Payer: Self-pay

## 2014-01-15 DIAGNOSIS — IMO0001 Reserved for inherently not codable concepts without codable children: Secondary | ICD-10-CM

## 2014-01-15 DIAGNOSIS — E1165 Type 2 diabetes mellitus with hyperglycemia: Principal | ICD-10-CM

## 2014-01-16 MED ORDER — GLIPIZIDE 10 MG PO TABS: 10.0000 mg | ORAL_TABLET | Freq: Two times a day (BID) | ORAL | Status: DC

## 2014-01-16 NOTE — Addendum Note (Signed)
Addended by: Shon Millet on: 01/16/2014 09:48 AM   Modules accepted: Orders

## 2014-01-16 NOTE — Telephone Encounter (Signed)
Pt called back to check on status of Refill. Pt notified Rx (glipizide) was declined due to pt needing an appt. 1st., med refill appt scheduled for 01/21/14, and med refilled x1 month

## 2014-01-21 ENCOUNTER — Ambulatory Visit (INDEPENDENT_AMBULATORY_CARE_PROVIDER_SITE_OTHER): Payer: BC Managed Care – PPO | Admitting: Internal Medicine

## 2014-01-21 ENCOUNTER — Encounter: Payer: Self-pay | Admitting: Internal Medicine

## 2014-01-21 VITALS — BP 158/98 | HR 80 | Temp 97.7°F | Wt 231.0 lb

## 2014-01-21 DIAGNOSIS — E1165 Type 2 diabetes mellitus with hyperglycemia: Secondary | ICD-10-CM

## 2014-01-21 DIAGNOSIS — E119 Type 2 diabetes mellitus without complications: Secondary | ICD-10-CM

## 2014-01-21 DIAGNOSIS — E669 Obesity, unspecified: Secondary | ICD-10-CM | POA: Insufficient documentation

## 2014-01-21 DIAGNOSIS — I10 Essential (primary) hypertension: Secondary | ICD-10-CM

## 2014-01-21 HISTORY — DX: Obesity, unspecified: E66.9

## 2014-01-21 NOTE — Assessment & Plan Note (Signed)
Compliance issues Advised him to start taking his medication as directed Metformin and Glipizide should be twice daily Lantus 40 units once a day Will repeat A1C today Foot exam today Asked him if he needed refills- he said no Discussed the importance of compliance with treatment regimen

## 2014-01-21 NOTE — Progress Notes (Signed)
Subjective:    Patient ID: Johnathan Archer, male    DOB: 17-Oct-1970, 43 y.o.   MRN: [id number]  HPI  Pt presents to the clinic today to follow up DM2. His last A1C was 7.9. He is currently on lantus 40 units QHS (He was instructed to take 30 units daily), glipizide and metformin (he reports that he is only taking these medications once daily instead of twice daily). His blood sugars range 170-200. He does have some tingling in his feet. He noticed this a few weeks ago. He has gained 12 pounds in the last 2 months (? How much is fluid).  Additionally, his BP remains elevated today. He has taken his lisinopril, lasix and carvedilol. He reports that he is only taking his carvedilol once daily (it is instructed twice daily)  Foot exam: today Eye Exam: 2-4 years Pneumovax: 2013 Tetanus: 2013  Review of Systems      Past Medical History  Diagnosis Date  . Hypertension   . Type II or unspecified type diabetes mellitus without mention of complication, uncontrolled   . 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  cath 2004 negative for obstruction, last echo 07/2011 EF 35%. b) Normal coronary arteries by cath in 2004 (when EF was noted at 15%). c) Last EF 20% by echo 12/2012  . History of chicken pox   . OSA (obstructive sleep apnea)   . Mitral regurgitation     Moderate by echo 07/2011  . ED (erectile dysfunction)   . Atrial fibrillation with RVR 10/2011    lone episode, converted with IV dilt, on coumadin given elevated CHADS2  . Warfarin anticoagulation     Current Outpatient Prescriptions  Medication Sig Dispense Refill  . amiodarone (PACERONE) 200 MG tablet Take 1 tablet (200 mg total) by mouth daily.  90 tablet  3  . carvedilol (COREG) 12.5 MG tablet Take 1 tablet (12.5 mg total) by mouth 2 (two) times daily.  180 tablet  3  . furosemide (LASIX) 40 MG tablet TAKE ONE TABLET BY MOUTH TWICE DAILY  180 tablet   1  . glipiZIDE (GLUCOTROL) 10 MG tablet Take 1 tablet (10 mg total) by mouth 2 (two) times daily before a meal.  60 tablet  0  . Insulin Detemir (LEVEMIR) 100 UNIT/ML Pen Inject 40 Units into the skin daily at 10 pm.  15 mL  0  . Insulin Pen Needle (PEN NEEDLES) 31G X 6 MM MISC Use as directed  50 each  0  . lisinopril (PRINIVIL,ZESTRIL) 10 MG tablet Take 1 tablet (10 mg total) by mouth daily.  90 tablet  3  . metFORMIN (GLUCOPHAGE) 500 MG tablet Take 1 tablet (500 mg total) by mouth 2 (two) times daily with a meal.  60 tablet  0  . Potassium Chloride ER 20 MEQ TBCR Take 40 mEq by mouth daily.  180 tablet  3  . spironolactone (ALDACTONE) 25 MG tablet Take 1 tablet (25 mg total) by mouth daily.  30 tablet  6  . warfarin (COUMADIN) 5 MG tablet Take as directed by Anticoagulation clinic  65 tablet  1   No current facility-administered medications for this visit.    No Known Allergies  Family History  Problem Relation Age of Onset  . Diabetes Mother   . Hypertension Father   . Stroke Neg Hx   . Coronary artery disease Neg Hx   . Cancer  Neg Hx     History   Social History  . Marital Status: Married    Spouse Name: [name]    Number of Children: [name]  . Years of Education: [name]   Occupational History  . Not on file.   Social History Main Topics  . Smoking status: Never Smoker   . Smokeless tobacco: Never Used  . Alcohol Use: Yes     Comment: Occasional 1x/mo  . Drug Use: No  . Sexual Activity: Not on file   Other Topics Concern  . Not on file   Social History Narrative   Caffeine: none   Lives with wife and daughter (8yo), no pets   Occupation: Naval architect   Edu: 24yr college   Activity: works, no regular activity   Diet: water daily, tries fruits/vegetables daily     Constitutional: Denies fever, malaise, fatigue, headache or abrupt weight changes.  Respiratory: Denies difficulty breathing, shortness of breath, cough or sputum production.   Cardiovascular: Denies  chest pain, chest tightness, palpitations or swelling in the hands or feet.  Gastrointestinal: Denies abdominal pain, bloating, constipation, diarrhea or blood in the stool.  Skin: Denies redness, rashes, lesions or ulcercations.  Neurological: Pt reports tingling in feet. Denies dizziness, difficulty with memory, difficulty with speech or problems with balance and coordination.   No other specific complaints in a complete review of systems (except as listed in HPI above).  Objective:   Physical Exam   BP 158/98  Pulse 80  Temp(Src) 97.7 F (36.5 C) (Tympanic)  Wt 231 lb (104.781 kg)  SpO2 97% Wt Readings from Last 3 Encounters:  01/21/14 231 lb (104.781 kg)  11/28/13 219 lb (99.338 kg)  10/04/13 223 lb 4 oz (101.266 kg)    General: Appears his stated age, obese but well developed, well nourished in NAD. Skin: Warm, dry and intact. No rashes, lesions or ulcerations noted. Cardiovascular: Normal rate and rhythm. S1,S2 noted.  No murmur, rubs or gallops noted. No JVD or BLE edema. No carotid bruits noted. Pulmonary/Chest: Normal effort and positive vesicular breath sounds. No respiratory distress. No wheezes, rales or ronchi noted.  Abdomen: Soft and nontender. Normal bowel sounds, no bruits noted. No distention or masses noted. Liver, spleen and kidneys non palpable. Neurological: Alert and oriented. Cranial nerves II-XII grossly intact.    BMET    Component Value Date/Time   NA 137 11/28/2013 0918   K 4.0 11/28/2013 0918   CL 101 11/28/2013 0918   CO2 27 11/28/2013 0918   GLUCOSE 320* 11/28/2013 0918   BUN 17 11/28/2013 0918   CREATININE 1.3 11/28/2013 0918   CALCIUM 9.3 11/28/2013 0918   GFRNONAA >90 07/07/2013 0400   GFRAA >90 07/07/2013 0400    Lipid Panel     Component Value Date/Time   CHOL 177 11/28/2013 0918   TRIG 163.0* 11/28/2013 0918   HDL 43.50 11/28/2013 0918   CHOLHDL 4 11/28/2013 0918   VLDL 32.6 11/28/2013 0918   LDLCALC 101* 11/28/2013 0918    CBC      Component Value Date/Time   WBC 5.1 10/04/2013 0853   RBC 4.20* 10/04/2013 0853   HGB 12.7* 10/04/2013 0853   HCT 38.8* 10/04/2013 0853   PLT 196.0 10/04/2013 0853   MCV 92.3 10/04/2013 0853   MCH 30.7 07/07/2013 0400   MCHC 32.6 10/04/2013 0853   RDW 13.2 10/04/2013 0853   LYMPHSABS 1.6 07/05/2013 1338   MONOABS 0.4 07/05/2013 1338   EOSABS 0.1 07/05/2013 1338  BASOSABS 0.0 07/05/2013 1338    Hgb A1C Lab Results  Component Value Date   HGBA1C 7.9* 10/04/2013        Assessment & Plan:

## 2014-01-21 NOTE — Assessment & Plan Note (Signed)
Uncontrolled but  Medication compliance is an issue Reinforced that he should be taking lisinopril daily and carvedilol twice daily Encouraged him to set an alarm on his phone that would remind him when to take his medications

## 2014-01-21 NOTE — Patient Instructions (Addendum)

## 2014-01-22 ENCOUNTER — Other Ambulatory Visit: Payer: Self-pay | Admitting: *Deleted

## 2014-01-22 LAB — HEMOGLOBIN A1C: HEMOGLOBIN A1C: 10.1 % — AB (ref 4.6–6.5)

## 2014-01-22 MED ORDER — FUROSEMIDE 40 MG PO TABS: ORAL_TABLET | ORAL | Status: DC

## 2014-01-31 ENCOUNTER — Other Ambulatory Visit: Payer: Self-pay | Admitting: *Deleted

## 2014-01-31 MED ORDER — WARFARIN SODIUM 5 MG PO TABS: ORAL_TABLET | ORAL | Status: DC

## 2014-02-03 ENCOUNTER — Ambulatory Visit (INDEPENDENT_AMBULATORY_CARE_PROVIDER_SITE_OTHER): Payer: BC Managed Care – PPO

## 2014-02-03 DIAGNOSIS — Z7901 Long term (current) use of anticoagulants: Secondary | ICD-10-CM

## 2014-02-03 DIAGNOSIS — I4891 Unspecified atrial fibrillation: Secondary | ICD-10-CM

## 2014-02-03 LAB — POCT INR: INR: 2.3

## 2014-03-04 ENCOUNTER — Other Ambulatory Visit: Payer: Self-pay

## 2014-03-04 DIAGNOSIS — IMO0001 Reserved for inherently not codable concepts without codable children: Secondary | ICD-10-CM

## 2014-03-04 DIAGNOSIS — E1165 Type 2 diabetes mellitus with hyperglycemia: Principal | ICD-10-CM

## 2014-03-04 MED ORDER — METFORMIN HCL 500 MG PO TABS: 500.0000 mg | ORAL_TABLET | Freq: Two times a day (BID) | ORAL | Status: DC

## 2014-03-28 ENCOUNTER — Other Ambulatory Visit: Payer: Self-pay | Admitting: *Deleted

## 2014-03-28 DIAGNOSIS — E119 Type 2 diabetes mellitus without complications: Secondary | ICD-10-CM

## 2014-03-28 MED ORDER — GLIPIZIDE 10 MG PO TABS: 10.0000 mg | ORAL_TABLET | Freq: Two times a day (BID) | ORAL | Status: DC

## 2014-05-06 ENCOUNTER — Other Ambulatory Visit: Payer: Self-pay | Admitting: Cardiovascular Disease

## 2014-05-07 ENCOUNTER — Other Ambulatory Visit: Payer: Self-pay | Admitting: *Deleted

## 2014-05-07 MED ORDER — WARFARIN SODIUM 5 MG PO TABS: ORAL_TABLET | ORAL | Status: DC

## 2014-05-08 ENCOUNTER — Ambulatory Visit (INDEPENDENT_AMBULATORY_CARE_PROVIDER_SITE_OTHER): Payer: BC Managed Care – PPO

## 2014-05-08 DIAGNOSIS — Z7901 Long term (current) use of anticoagulants: Secondary | ICD-10-CM

## 2014-05-08 DIAGNOSIS — I4891 Unspecified atrial fibrillation: Secondary | ICD-10-CM

## 2014-05-08 LAB — POCT INR: INR: 1.3

## 2014-05-08 MED ORDER — WARFARIN SODIUM 5 MG PO TABS: ORAL_TABLET | ORAL | Status: DC

## 2014-05-12 ENCOUNTER — Encounter: Payer: Self-pay | Admitting: Internal Medicine

## 2014-05-12 ENCOUNTER — Ambulatory Visit (INDEPENDENT_AMBULATORY_CARE_PROVIDER_SITE_OTHER): Payer: BC Managed Care – PPO | Admitting: Internal Medicine

## 2014-05-12 VITALS — BP 154/102 | HR 83 | Temp 98.3°F | Wt 228.0 lb

## 2014-05-12 DIAGNOSIS — I5042 Chronic combined systolic (congestive) and diastolic (congestive) heart failure: Secondary | ICD-10-CM

## 2014-05-12 DIAGNOSIS — E1165 Type 2 diabetes mellitus with hyperglycemia: Secondary | ICD-10-CM

## 2014-05-12 DIAGNOSIS — E669 Obesity, unspecified: Secondary | ICD-10-CM

## 2014-05-12 DIAGNOSIS — I4891 Unspecified atrial fibrillation: Secondary | ICD-10-CM

## 2014-05-12 DIAGNOSIS — I10 Essential (primary) hypertension: Secondary | ICD-10-CM

## 2014-05-12 DIAGNOSIS — N529 Male erectile dysfunction, unspecified: Secondary | ICD-10-CM

## 2014-05-12 MED ORDER — LISINOPRIL 20 MG PO TABS: 20.0000 mg | ORAL_TABLET | Freq: Every day | ORAL | Status: DC

## 2014-05-12 NOTE — Progress Notes (Signed)
Pre visit review using our clinic review tool, if applicable. No additional management support is needed unless otherwise documented below in the visit note. 

## 2014-05-12 NOTE — Progress Notes (Signed)
Subjective:    Patient ID: Johnathan Archer, male    DOB: 1971/05/30, 43 y.o.   MRN: [id number]  HPI  Pt presents to the clinic today for 3 month follow up of chronic medical conditions.  HTN: BP today is 154/102. He reports that he takes his medication daily as prescribed. He is on Coreg and Lisinopril. He does not understand why his blood pressure is always elevated. He reports that he is under a lot of stress at work.  DM 2: Last A1C 10.% 01/2014. He reports that the lowest his fasting blood sugars are is 185. He takes his Metformin and Glipizide daily as prescribed. He reports that he is only taking the insulin every 3 days because he cannot afford to take it every day. He also reports that he is taking Lantus not Levemir, however Levemir is what I prescribe. He declines flu vaccine. Pneumonia vaccine 2013.   CHF: No current issues on Lasix and Spironolactone. He reports that he is not as diligent about taking these medications as he should be. Looking in his EMR, he has cancelled many appts with cardiology. Last follow up with them 11/2013 reviewed.  Afib with RVR: No current issues of amiodarone, beta blocker and coumadin. Last follow up with cardiology 11/2013.  Obesity: He reports that he is not obese. BMI 31. He has lost 3 lbs since his last visit.  His biggest concern, despite all the issues listed above is his erectile dysfunction. He reports the medications are making his erectile dysfunction worse and he would like to stop them all.  Review of Systems      Past Medical History  Diagnosis Date  . Hypertension   . Type II or unspecified type diabetes mellitus without mention of complication, uncontrolled   . 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  cath 2004 negative for obstruction, last echo 07/2011 EF 35%. b) Normal coronary arteries by cath in 2004 (when EF was noted at 15%). c) Last EF 20% by  echo 12/2012  . History of chicken pox   . OSA (obstructive sleep apnea)   . Mitral regurgitation     Moderate by echo 07/2011  . ED (erectile dysfunction)   . Atrial fibrillation with RVR 10/2011    lone episode, converted with IV dilt, on coumadin given elevated CHADS2  . Warfarin anticoagulation     Current Outpatient Prescriptions  Medication Sig Dispense Refill  . amiodarone (PACERONE) 200 MG tablet Take 1 tablet (200 mg total) by mouth daily. 90 tablet 3  . carvedilol (COREG) 12.5 MG tablet Take 1 tablet (12.5 mg total) by mouth 2 (two) times daily. 180 tablet 3  . furosemide (LASIX) 40 MG tablet TAKE ONE TABLET BY MOUTH TWICE DAILY 120 tablet 0  . glipiZIDE (GLUCOTROL) 10 MG tablet Take 1 tablet (10 mg total) by mouth 2 (two) times daily before a meal. 60 tablet 5  . Insulin Detemir (LEVEMIR) 100 UNIT/ML Pen Inject 40 Units into the skin daily at 10 pm. 15 mL 0  . Insulin Pen Needle (PEN NEEDLES) 31G X 6 MM MISC Use as directed 50 each 0  . metFORMIN (GLUCOPHAGE) 500 MG tablet Take 1 tablet (500 mg total) by mouth 2 (two) times daily with a meal. 180 tablet 1  . Potassium Chloride ER 20 MEQ TBCR Take 40 mEq by mouth daily. 180 tablet 3  . sildenafil (REVATIO) 20 MG tablet  Take 60-100 mg by mouth daily.    Marland Kitchen spironolactone (ALDACTONE) 25 MG tablet Take 1 tablet (25 mg total) by mouth daily. 30 tablet 6  . warfarin (COUMADIN) 5 MG tablet Take as directed by Anticoagulation clinic 60 tablet 0  . lisinopril (PRINIVIL,ZESTRIL) 20 MG tablet Take 1 tablet (20 mg total) by mouth daily. 90 tablet 3   No current facility-administered medications for this visit.    No Known Allergies  Family History  Problem Relation Age of Onset  . Diabetes Mother   . Hypertension Father   . Stroke Neg Hx   . Coronary artery disease Neg Hx   . Cancer Neg Hx     History   Social History  . Marital Status: Married    Spouse Name: [name]    Number of Children: [name]  . Years of Education: [name]    Occupational History  . Not on file.   Social History Main Topics  . Smoking status: Never Smoker   . Smokeless tobacco: Never Used  . Alcohol Use: 0.0 oz/week    0 Not specified per week     Comment: Occasional 1x/mo  . Drug Use: No  . Sexual Activity: Not on file   Other Topics Concern  . Not on file   Social History Narrative   Caffeine: none   Lives with wife and daughter (8yo), no pets   Occupation: Naval architect   Edu: 3yr college   Activity: works, no regular activity   Diet: water daily, tries fruits/vegetables daily     Constitutional: Denies fever, malaise, fatigue, headache or abrupt weight changes.  Respiratory: Denies difficulty breathing, shortness of breath, cough or sputum production.   Cardiovascular: Denies chest pain, chest tightness, palpitations or swelling in the hands or feet.  GU: Pt reports erectile dysfunction. Denies urgency, frequency, pain with urination, burning sensation, blood in urine, odor or discharge. Skin: Denies redness, rashes, lesions or ulcercations.  Neurological: Denies dizziness, difficulty with memory, difficulty with speech or problems with balance and coordination.   No other specific complaints in a complete review of systems (except as listed in HPI above).  Objective:   Physical Exam   BP 154/102 mmHg  Pulse 83  Temp(Src) 98.3 F (36.8 C) (Oral)  Wt 228 lb (103.42 kg)  SpO2 98% Wt Readings from Last 3 Encounters:  05/12/14 228 lb (103.42 kg)  01/21/14 231 lb (104.781 kg)  11/28/13 219 lb (99.338 kg)    General: Appears his stated age, centrally obese in NAD. Skin: Warm, dry and intact. No rashes, lesions or ulcerations noted. Cardiovascular: Normal rate and rhythm. S1,S2 noted. S3 noted. No murmur, rubs noted. No JVD or BLE edema. No carotid bruits noted. Pulmonary/Chest: Normal effort and positive vesicular breath sounds. No respiratory distress. No wheezes, rales or ronchi noted.   Neurological: Alert  and oriented. Marland Kitchen   BMET    Component Value Date/Time   NA 137 11/28/2013 0918   K 4.0 11/28/2013 0918   CL 101 11/28/2013 0918   CO2 27 11/28/2013 0918   GLUCOSE 320* 11/28/2013 0918   BUN 17 11/28/2013 0918   CREATININE 1.3 11/28/2013 0918   CALCIUM 9.3 11/28/2013 0918   GFRNONAA >90 07/07/2013 0400   GFRAA >90 07/07/2013 0400    Lipid Panel     Component Value Date/Time   CHOL 177 11/28/2013 0918   TRIG 163.0* 11/28/2013 0918   HDL 43.50 11/28/2013 0918   CHOLHDL 4 11/28/2013 0918   VLDL 32.6  11/28/2013 0918   LDLCALC 101* 11/28/2013 0918    CBC    Component Value Date/Time   WBC 5.1 10/04/2013 0853   RBC 4.20* 10/04/2013 0853   HGB 12.7* 10/04/2013 0853   HCT 38.8* 10/04/2013 0853   PLT 196.0 10/04/2013 0853   MCV 92.3 10/04/2013 0853   MCH 30.7 07/07/2013 0400   MCHC 32.6 10/04/2013 0853   RDW 13.2 10/04/2013 0853   LYMPHSABS 1.6 07/05/2013 1338   MONOABS 0.4 07/05/2013 1338   EOSABS 0.1 07/05/2013 1338   BASOSABS 0.0 07/05/2013 1338    Hgb A1C Lab Results  Component Value Date   HGBA1C 10.1* 01/21/2014        Assessment & Plan:

## 2014-05-12 NOTE — Patient Instructions (Signed)

## 2014-05-12 NOTE — Assessment & Plan Note (Signed)
Euvolemic on exam today Advised him to continue his lasix, spironolactone and beta blocker, although he reports he is not taking them as prescribed Will check CBC and CMET today Advised him to continue to follow with cardiology

## 2014-05-12 NOTE — Assessment & Plan Note (Signed)
No current issues on amiodarone, beta blocker and coumadin

## 2014-05-12 NOTE — Assessment & Plan Note (Signed)
This is his biggest concern Advised him that if we cannot get his DM2 and HTN under control, then this will likely not improve

## 2014-05-12 NOTE — Assessment & Plan Note (Signed)
Poor control He wants to stop his medication Advised him his ED may improve if he could get better control over his HTN Will increase his lisinopril to 20 mg daily Continue coreg at current dose Will check CBC and CMET today

## 2014-05-12 NOTE — Assessment & Plan Note (Signed)
Encouraged him to work on diet and exercise 

## 2014-05-12 NOTE — Assessment & Plan Note (Signed)
Uncontrolled and non compliant Will repeat A1C today He will call me with which insulin he is actually taking, lantus versus levemir. If he is on levemir, will switch to lantus for hopefully better compliance at reduced cost Continue metformin and glipizide Foot exam today Pneumovax UTD He declines flu shot today Encouraged him to visit an eye doctor once yearly

## 2014-05-13 LAB — COMPREHENSIVE METABOLIC PANEL
ALBUMIN: 3.8 g/dL (ref 3.5–5.2)
ALT: 23 U/L (ref 0–53)
AST: 24 U/L (ref 0–37)
Alkaline Phosphatase: 45 U/L (ref 39–117)
BUN: 14 mg/dL (ref 6–23)
CALCIUM: 9.4 mg/dL (ref 8.4–10.5)
CO2: 28 mEq/L (ref 19–32)
Chloride: 103 mEq/L (ref 96–112)
Creatinine, Ser: 1.1 mg/dL (ref 0.4–1.5)
GFR: 91.6 mL/min (ref >60.00)
Glucose, Bld: 226 mg/dL — ABNORMAL HIGH (ref 70–99)
POTASSIUM: 4.1 meq/L (ref 3.5–5.1)
SODIUM: 140 meq/L (ref 135–145)
TOTAL PROTEIN: 7.7 g/dL (ref 6.0–8.3)
Total Bilirubin: 1.2 mg/dL (ref 0.2–1.2)

## 2014-05-13 LAB — LIPID PANEL
CHOL/HDL RATIO: 4
Cholesterol: 158 mg/dL (ref 0–200)
HDL: 41.1 mg/dL (ref >39.00)
LDL CALC: 85 mg/dL (ref 0–99)
NONHDL: 116.9
TRIGLYCERIDES: 158 mg/dL — AB (ref 0.0–149.0)
VLDL: 31.6 mg/dL (ref 0.0–40.0)

## 2014-05-13 LAB — HEMOGLOBIN A1C: HEMOGLOBIN A1C: 8.9 % — AB (ref 4.6–6.5)

## 2014-05-14 MED ORDER — INSULIN GLARGINE 100 UNIT/ML SOLOSTAR PEN: 40.0000 [IU] | PEN_INJECTOR | Freq: Every day | SUBCUTANEOUS | Status: DC

## 2014-05-14 NOTE — Addendum Note (Signed)
Addended by: Roena Malady on: 05/14/2014 11:16 AM   Modules accepted: Orders, Medications

## 2014-05-19 ENCOUNTER — Ambulatory Visit: Payer: BC Managed Care – PPO | Admitting: Internal Medicine

## 2014-05-20 ENCOUNTER — Telehealth: Payer: Self-pay | Admitting: Internal Medicine

## 2014-05-20 NOTE — Telephone Encounter (Signed)
Is this the lantus? I have no other options. I think he needs to be referred to endocrinology

## 2014-05-20 NOTE — Telephone Encounter (Signed)
Please refer to msg below 

## 2014-05-20 NOTE — Telephone Encounter (Signed)
Calling about RX. Says its too much cost and wants to discuss other options. Please advise! Thank you!

## 2014-05-21 ENCOUNTER — Other Ambulatory Visit: Payer: Self-pay | Admitting: Internal Medicine

## 2014-05-21 DIAGNOSIS — E1165 Type 2 diabetes mellitus with hyperglycemia: Secondary | ICD-10-CM

## 2014-05-21 NOTE — Telephone Encounter (Signed)
Referral placed Johnathan Archer- he prefers [employer] operational [employer]

## 2014-05-21 NOTE — Telephone Encounter (Signed)
Both medications are over $200.Johnathan KitchenMarland KitchenPt expressed understanding--he states he is okay with seeing Endo and would prefer something in [place]

## 2014-05-22 ENCOUNTER — Encounter (HOSPITAL_COMMUNITY): Payer: Self-pay | Admitting: *Deleted

## 2014-05-22 ENCOUNTER — Emergency Department (HOSPITAL_COMMUNITY)
Admission: EM | Admit: 2014-05-22 | Discharge: 2014-05-22 | Disposition: A | Discharge status: Home / Self Care | Payer: BC Managed Care – PPO | Attending: Emergency Medicine | Admitting: Emergency Medicine

## 2014-05-22 ENCOUNTER — Emergency Department (HOSPITAL_COMMUNITY): Payer: BC Managed Care – PPO

## 2014-05-22 DIAGNOSIS — Z8669 Personal history of other diseases of the nervous system and sense organs: Secondary | ICD-10-CM | POA: Insufficient documentation

## 2014-05-22 DIAGNOSIS — R079 Chest pain, unspecified: Secondary | ICD-10-CM | POA: Diagnosis not present

## 2014-05-22 DIAGNOSIS — R002 Palpitations: Secondary | ICD-10-CM

## 2014-05-22 DIAGNOSIS — I5042 Chronic combined systolic (congestive) and diastolic (congestive) heart failure: Secondary | ICD-10-CM | POA: Diagnosis not present

## 2014-05-22 DIAGNOSIS — Z9889 Other specified postprocedural states: Secondary | ICD-10-CM | POA: Diagnosis not present

## 2014-05-22 DIAGNOSIS — Z79899 Other long term (current) drug therapy: Secondary | ICD-10-CM | POA: Diagnosis not present

## 2014-05-22 DIAGNOSIS — Z87448 Personal history of other diseases of urinary system: Secondary | ICD-10-CM | POA: Diagnosis not present

## 2014-05-22 DIAGNOSIS — E119 Type 2 diabetes mellitus without complications: Secondary | ICD-10-CM | POA: Diagnosis not present

## 2014-05-22 DIAGNOSIS — Z7901 Long term (current) use of anticoagulants: Secondary | ICD-10-CM | POA: Insufficient documentation

## 2014-05-22 DIAGNOSIS — I159 Secondary hypertension, unspecified: Secondary | ICD-10-CM | POA: Diagnosis not present

## 2014-05-22 DIAGNOSIS — Z8619 Personal history of other infectious and parasitic diseases: Secondary | ICD-10-CM | POA: Insufficient documentation

## 2014-05-22 DIAGNOSIS — Z794 Long term (current) use of insulin: Secondary | ICD-10-CM | POA: Diagnosis not present

## 2014-05-22 DIAGNOSIS — R5383 Other fatigue: Secondary | ICD-10-CM | POA: Insufficient documentation

## 2014-05-22 DIAGNOSIS — I10 Essential (primary) hypertension: Secondary | ICD-10-CM | POA: Diagnosis present

## 2014-05-22 DIAGNOSIS — I4891 Unspecified atrial fibrillation: Secondary | ICD-10-CM | POA: Diagnosis not present

## 2014-05-22 LAB — BASIC METABOLIC PANEL
ANION GAP: 15 (ref 5–15)
BUN: 21 mg/dL (ref 6–23)
CALCIUM: 9.5 mg/dL (ref 8.4–10.5)
CHLORIDE: 100 meq/L (ref 96–112)
CO2: 24 mEq/L (ref 19–32)
CREATININE: 1.04 mg/dL (ref 0.50–1.35)
GFR calc non Af Amer: 86 mL/min — ABNORMAL LOW (ref >90)
Glucose, Bld: 253 mg/dL — ABNORMAL HIGH (ref 70–99)
Potassium: 4.1 mEq/L (ref 3.7–5.3)
Sodium: 139 mEq/L (ref 137–147)

## 2014-05-22 LAB — PROTIME-INR
INR: 1.92 — ABNORMAL HIGH (ref 0.00–1.49)
Prothrombin Time: 22.1 seconds — ABNORMAL HIGH (ref 11.6–15.2)

## 2014-05-22 LAB — CBC
HCT: 39.7 % (ref 39.0–52.0)
Hemoglobin: 13.4 g/dL (ref 13.0–17.0)
MCH: 30 pg (ref 26.0–34.0)
MCHC: 33.8 g/dL (ref 30.0–36.0)
MCV: 89 fL (ref 78.0–100.0)
Platelets: 242 10*3/uL (ref 150–400)
RBC: 4.46 MIL/uL (ref 4.22–5.81)
RDW: 12.7 % (ref 11.5–15.5)
WBC: 4.7 10*3/uL (ref 4.0–10.5)

## 2014-05-22 LAB — I-STAT TROPONIN, ED: Troponin i, poc: 0.02 ng/mL (ref 0.00–0.08)

## 2014-05-22 LAB — PRO B NATRIURETIC PEPTIDE: Pro B Natriuretic peptide (BNP): 345.7 pg/mL — ABNORMAL HIGH (ref 0–125)

## 2014-05-22 NOTE — ED Provider Notes (Signed)
CSN: [id number]     Arrival date & time 05/22/14  1823 History  This chart was scribed for non-physician practitioner, Jaynie Crumble, PA-C, working with Geoffery Lyons, MD, by Bronson Curb, ED Scribe. This patient was seen in room TR08C/TR08C and the patient's care was started at 9:05 PM.   Chief Complaint  Patient presents with  . Hypertension    The history is provided by the patient. No language interpreter was used.     HPI Comments: Johnathan Archer is a 43 y.o. male, with history of HTN and CHF, who presents to the Emergency Department for elevated blood pressure PTA (triage BP 125/72). There is associated palpitations and increased fatigue after mowing the lawn today. He also notes mild, sharp chest discomfort. Patient reports a recent increase of his BP medication, Lisinopril, 2 weeks ago. He reports he ate 4 hot dogs which is different from his normal diet, and suspects this could be related to this symptoms. Patient has history of A-fib, but states his palpitations do not feel similar. Patient also takes Coumadin and Plavix. He denies HA, nausea, cough, fever, flu-like symptoms, or urinary symptoms. He denies history of MI.   Past Medical History  Diagnosis Date  . Hypertension   . Type II or unspecified type diabetes mellitus without mention of complication, uncontrolled   . 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  cath 2004 negative for obstruction, last echo 07/2011 EF 35%. b) Normal coronary arteries by cath in 2004 (when EF was noted at 15%). c) Last EF 20% by echo 12/2012  . History of chicken pox   . OSA (obstructive sleep apnea)   . Mitral regurgitation     Moderate by echo 07/2011  . ED (erectile dysfunction)   . Atrial fibrillation with RVR 10/2011    lone episode, converted with IV dilt, on coumadin given elevated CHADS2  . Warfarin anticoagulation    Past Surgical History  Procedure  Laterality Date  . Hernia repair  2002  . Knee surgery      L knee in HS  . Cardiac catheterization  2004    no blockages  . US echocardiography  07/2011    mod LVH, EF 35%, diffuse hypokinesis of entire myocardium, irreversible restrictive pattern (grade 4 diastolic dysfunction), mod MR, mod dilated LA/RA, decreased RV systolic fxn, no PFO  . US echocardiography  12/2012    severe LVH, EF 20-25%, mod MR, reduced RV systolic function   Family History  Problem Relation Age of Onset  . Diabetes Mother   . Hypertension Father   . Stroke Neg Hx   . Coronary artery disease Neg Hx   . Cancer Neg Hx    History  Substance Use Topics  . Smoking status: Never Smoker   . Smokeless tobacco: Never Used  . Alcohol Use: No     Comment: Occasional 1x/mo    Review of Systems  Constitutional: Positive for fatigue. Negative for fever.  Respiratory: Negative for cough.   Cardiovascular: Positive for chest pain and palpitations.  Gastrointestinal: Negative for nausea.  Neurological: Negative for headaches.      Allergies  Review of patient's allergies indicates no known allergies.  Home Medications   Prior to Admission medications   Medication Sig Start Date End Date Taking? Authorizing Provider  amiodarone (PACERONE) 200 MG tablet Take 1 tablet (200 mg total) by mouth daily. 11/28/13   Deloris Ping Nahser,  MD  carvedilol (COREG) 12.5 MG tablet Take 1 tablet (12.5 mg total) by mouth 2 (two) times daily. 11/28/13   Vesta MixerPhilip J Nahser, MD  furosemide (LASIX) 40 MG tablet TAKE ONE TABLET BY MOUTH TWICE DAILY 01/22/14   Lorre Munroeegina W Baity, NP  glipiZIDE (GLUCOTROL) 10 MG tablet Take 1 tablet (10 mg total) by mouth 2 (two) times daily before a meal. 03/28/14   Lorre Munroeegina W Baity, NP  Insulin Glargine (LANTUS) 100 UNIT/ML Solostar Pen Inject 40 Units into the skin daily at 10 pm. 05/14/14   Lorre Munroeegina W Baity, NP  Insulin Pen Needle (PEN NEEDLES) 31G X 6 MM MISC Use as directed 07/07/13   Maretta BeesShanker M Ghimire, MD   lisinopril (PRINIVIL,ZESTRIL) 20 MG tablet Take 1 tablet (20 mg total) by mouth daily. 05/12/14   Lorre Munroeegina W Baity, NP  metFORMIN (GLUCOPHAGE) 500 MG tablet Take 1 tablet (500 mg total) by mouth 2 (two) times daily with a meal. 03/04/14   Lorre Munroeegina W Baity, NP  Potassium Chloride ER 20 MEQ TBCR Take 40 mEq by mouth daily. 11/28/13   Vesta MixerPhilip J Nahser, MD  sildenafil (REVATIO) 20 MG tablet Take 60-100 mg by mouth daily.    Historical Provider, MD  spironolactone (ALDACTONE) 25 MG tablet Take 1 tablet (25 mg total) by mouth daily. 09/13/13   Vesta MixerPhilip J Nahser, MD  warfarin (COUMADIN) 5 MG tablet Take as directed by Anticoagulation clinic 05/08/14   Vesta MixerPhilip J Nahser, MD   Triage Vitals: BP 125/72 mmHg  Pulse 86  Temp(Src) 99.1 F (37.3 C) (Oral)  Resp 18  SpO2 98%  Physical Exam  Constitutional: He is oriented to person, place, and time. He appears well-developed and well-nourished. No distress.  HENT:  Head: Normocephalic and atraumatic.  Eyes: Conjunctivae and EOM are normal.  Neck: Neck supple. No tracheal deviation present.  Cardiovascular: Normal rate.   Pulmonary/Chest: Effort normal. No respiratory distress.  Musculoskeletal: Normal range of motion.  Neurological: He is alert and oriented to person, place, and time.  Skin: Skin is warm and dry.  Psychiatric: He has a normal mood and affect. His behavior is normal.  Nursing note and vitals reviewed.   ED Course  Procedures (including critical care time)  DIAGNOSTIC STUDIES: Oxygen Saturation is 98% on room air, normal by my interpretation.    COORDINATION OF CARE: At 2111 Discussed treatment plan with patient. Patient agrees.   Labs Review Labs Reviewed  BASIC METABOLIC PANEL - Abnormal; Notable for the following:    Glucose, Bld 253 (*)    GFR calc non Af Amer 86 (*)    All other components within normal limits  PROTIME-INR - Abnormal; Notable for the following:    Prothrombin Time 22.1 (*)    INR 1.92 (*)    All other  components within normal limits  CBC  I-STAT TROPOININ, ED    Imaging Review Dg Chest 2 View  05/22/2014   CLINICAL DATA:  Sharp chest pain.  EXAM: CHEST  2 VIEW  COMPARISON:  07/05/2013  FINDINGS: The heart size and mediastinal contours are within normal limits. Both lungs are clear. The visualized skeletal structures are unremarkable.  IMPRESSION: No active cardiopulmonary disease.   Electronically Signed   By: Myles RosenthalJohn  Stahl M.D.   On: 05/22/2014 20:16    Date: 05/23/2014  Rate: 88  Rhythm: normal sinus rhythm and premature ventricular contractions (PVC)  QRS Axis: normal  Intervals: QT prolonged  ST/T Wave abnormalities: nonspecific T wave changes  Conduction Disutrbances:none  Narrative Interpretation:   Old EKG Reviewed: unchanged    MDM   Final diagnoses:  Secondary hypertension, unspecified  Palpitations    Patient with multiple medical issues, including hypertension and CHF. Patient's calf is 20-25% with diffuse global hypokinesia. Patient states that the reason he is here is because he thought his blood pressure was high, states he felt fatigued after working in the yard today, and had some palpitations. He denied ever having any chest pain or shortness of breath. He denies palpitations similar to those when he had A. fib. He states he feels back to normal now. Denies dizziness or diaphoresis. No coronary disease history. Will get labs, EKG, chest x-ray, will monitor.   EKG is unchanged, showing sinus rhythm, labs all unremarkable except for hyperglycemia. BNP is on the low side for him. He has no evidence of fluid overload. Chest x-ray unremarkable. Patient's blood pressure is down to 120s and 130s systolic rechecked several times. He is feeling well. Delta troponin checked and is negative. At this time I think patient is stable for discharge home with close outpatient follow-up with his primary care doctor and cardiologist. Discussed with Dr. Tami Ribas, agrees the plan. Patient  agreeable to the plan.  Filed Vitals:   05/22/14 1827 05/22/14 1838 05/22/14 2100 05/22/14 2229  BP: 178/119 157/97 125/72 134/75  Pulse: 91  86 81  Temp: 98.5 F (36.9 C)  99.1 F (37.3 C) 98.7 F (37.1 C)  TempSrc: Oral  Oral Oral  Resp: 16 18 18 16   SpO2: 97%  98% 96%     I personally performed the services described in this documentation, which was scribed in my presence. The recorded information has been reviewed and is accurate.      Lottie Mussel, PA-C 05/23/14 1610  Geoffery Lyons, MD 05/25/14 1017

## 2014-05-22 NOTE — ED Notes (Signed)
Patient states he has been experiencing high than normal blood pressures over past few weeks, patient with recent increase of medication dosage, patient states it feels as if his heart is pounding, patient denies headache and denies chest pain,

## 2014-05-22 NOTE — Discharge Instructions (Signed)
Make sure you eat well. Continue to take all your medications. Yourru workup here is normal. Follow-up with your doctor for recheck of your blood pressure and for your palpitations. If you're having any issues or worsening symptoms, please return back to emergency department  Hypertension Hypertension, commonly called high blood pressure, is when the force of blood pumping through your arteries is too strong. Your arteries are the blood vessels that carry blood from your heart throughout your body. A blood pressure reading consists of a higher number over a lower number, such as 110/72. The higher number (systolic) is the pressure inside your arteries when your heart pumps. The lower number (diastolic) is the pressure inside your arteries when your heart relaxes. Ideally you want your blood pressure below 120/80. Hypertension forces your heart to work harder to pump blood. Your arteries may become narrow or stiff. Having hypertension puts you at risk for heart disease, stroke, and other problems.  RISK FACTORS Some risk factors for high blood pressure are controllable. Others are not.  Risk factors you cannot control include:   Race. You may be at higher risk if you are African American.  Age. Risk increases with age.  Gender. Men are at higher risk than women before age 44 years. After age 44, women are at higher risk than men. Risk factors you can control include:  Not getting enough exercise or physical activity.  Being overweight.  Getting too much fat, sugar, calories, or salt in your diet.  Drinking too much alcohol. SIGNS AND SYMPTOMS Hypertension does not usually cause signs or symptoms. Extremely high blood pressure (hypertensive crisis) may cause headache, anxiety, shortness of breath, and nosebleed. DIAGNOSIS  To check if you have hypertension, your health care provider will measure your blood pressure while you are seated, with your arm held at the level of your heart. It should  be measured at least twice using the same arm. Certain conditions can cause a difference in blood pressure between your right and left arms. A blood pressure reading that is higher than normal on one occasion does not mean that you need treatment. If one blood pressure reading is high, ask your health care provider about having it checked again. TREATMENT  Treating high blood pressure includes making lifestyle changes and possibly taking medicine. Living a healthy lifestyle can help lower high blood pressure. You may need to change some of your habits. Lifestyle changes may include:  Following the DASH diet. This diet is high in fruits, vegetables, and whole grains. It is low in salt, red meat, and added sugars.  Getting at least 2 hours of brisk physical activity every week.  Losing weight if necessary.  Not smoking.  Limiting alcoholic beverages.  Learning ways to reduce stress. If lifestyle changes are not enough to get your blood pressure under control, your health care provider may prescribe medicine. You may need to take more than one. Work closely with your health care provider to understand the risks and benefits. HOME CARE INSTRUCTIONS  Have your blood pressure rechecked as directed by your health care provider.   Take medicines only as directed by your health care provider. Follow the directions carefully. Blood pressure medicines must be taken as prescribed. The medicine does not work as well when you skip doses. Skipping doses also puts you at risk for problems.   Do not smoke.   Monitor your blood pressure at home as directed by your health care provider. SEEK MEDICAL CARE IF:  You think you are having a reaction to medicines taken.  You have recurrent headaches or feel dizzy.  You have swelling in your ankles.  You have trouble with your vision. SEEK IMMEDIATE MEDICAL CARE IF:  You develop a severe headache or confusion.  You have unusual weakness,  numbness, or feel faint.  You have severe chest or abdominal pain.  You vomit repeatedly.  You have trouble breathing. MAKE SURE YOU:   Understand these instructions.  Will watch your condition.  Will get help right away if you are not doing well or get worse. Document Released: 06/06/2005 Document Revised: 10/21/2013 Document Reviewed: 03/29/2013 [facility] Patient Information 2015 [place], [place]. This information is not intended to replace advice given to you by your health care provider. Make sure you discuss any questions you have with your health care provider.

## 2014-05-27 ENCOUNTER — Telehealth: Payer: Self-pay | Admitting: Cardiovascular Disease

## 2014-05-27 ENCOUNTER — Other Ambulatory Visit: Payer: Self-pay

## 2014-05-27 MED ORDER — FUROSEMIDE 40 MG PO TABS: ORAL_TABLET | ORAL | Status: DC

## 2014-05-27 NOTE — Telephone Encounter (Signed)
Disregard.  error

## 2014-06-10 ENCOUNTER — Ambulatory Visit: Payer: BC Managed Care – PPO | Admitting: Internal Medicine

## 2014-06-10 ENCOUNTER — Telehealth: Payer: Self-pay | Admitting: Internal Medicine

## 2014-06-10 NOTE — Telephone Encounter (Signed)
He will need to follow up. If he continues to no show appts, he will be dismissed

## 2014-06-10 NOTE — Telephone Encounter (Signed)
Patient did not come for their scheduled appointment today for 1 month follow up Please let me know if the patient needs to be contacted immediately for follow up or if no follow up is necessary.   ° °

## 2014-06-10 NOTE — Telephone Encounter (Signed)
Appointment 1/7  Pt aware

## 2014-06-12 ENCOUNTER — Other Ambulatory Visit: Payer: Self-pay | Admitting: *Deleted

## 2014-06-12 MED ORDER — SPIRONOLACTONE 25 MG PO TABS: 25.0000 mg | ORAL_TABLET | Freq: Every day | ORAL | Status: DC

## 2014-06-12 MED ORDER — WARFARIN SODIUM 5 MG PO TABS: ORAL_TABLET | ORAL | Status: DC

## 2014-06-26 ENCOUNTER — Ambulatory Visit: Payer: BC Managed Care – PPO | Admitting: Internal Medicine

## 2014-06-26 ENCOUNTER — Encounter: Payer: Self-pay | Admitting: Cardiovascular Disease

## 2014-06-26 ENCOUNTER — Encounter: Payer: Self-pay | Admitting: Internal Medicine

## 2014-06-26 ENCOUNTER — Ambulatory Visit (INDEPENDENT_AMBULATORY_CARE_PROVIDER_SITE_OTHER): Payer: BC Managed Care – PPO | Admitting: Cardiovascular Disease

## 2014-06-26 ENCOUNTER — Ambulatory Visit (INDEPENDENT_AMBULATORY_CARE_PROVIDER_SITE_OTHER): Payer: BC Managed Care – PPO | Admitting: Pharmacist

## 2014-06-26 ENCOUNTER — Telehealth: Payer: Self-pay | Admitting: Internal Medicine

## 2014-06-26 VITALS — BP 160/106 | HR 81 | Ht 71.0 in | Wt 225.4 lb

## 2014-06-26 DIAGNOSIS — Z7901 Long term (current) use of anticoagulants: Secondary | ICD-10-CM

## 2014-06-26 DIAGNOSIS — I4891 Unspecified atrial fibrillation: Secondary | ICD-10-CM

## 2014-06-26 DIAGNOSIS — Z13220 Encounter for screening for lipoid disorders: Secondary | ICD-10-CM

## 2014-06-26 DIAGNOSIS — I420 Dilated cardiomyopathy: Secondary | ICD-10-CM

## 2014-06-26 LAB — TSH: TSH: 0.9 u[IU]/mL (ref 0.35–4.50)

## 2014-06-26 LAB — HEPATIC FUNCTION PANEL
ALT: 26 U/L (ref 0–53)
AST: 14 U/L (ref 0–37)
Albumin: 4 g/dL (ref 3.5–5.2)
Alkaline Phosphatase: 53 U/L (ref 39–117)
Bilirubin, Direct: 0.1 mg/dL (ref 0.0–0.3)
Total Bilirubin: 0.8 mg/dL (ref 0.2–1.2)
Total Protein: 8.1 g/dL (ref 6.0–8.3)

## 2014-06-26 LAB — LIPID PANEL
Cholesterol: 191 mg/dL (ref 0–200)
HDL: 40.3 mg/dL (ref >39.00)
NonHDL: 150.7
TRIGLYCERIDES: 223 mg/dL — AB (ref 0.0–149.0)
Total CHOL/HDL Ratio: 5
VLDL: 44.6 mg/dL — ABNORMAL HIGH (ref 0.0–40.0)

## 2014-06-26 LAB — BASIC METABOLIC PANEL
BUN: 19 mg/dL (ref 6–23)
CALCIUM: 10 mg/dL (ref 8.4–10.5)
CO2: 28 mEq/L (ref 19–32)
Chloride: 103 mEq/L (ref 96–112)
Creatinine, Ser: 1.3 mg/dL (ref 0.4–1.5)
GFR: 75.08 mL/min (ref >60.00)
Glucose, Bld: 285 mg/dL — ABNORMAL HIGH (ref 70–99)
POTASSIUM: 4 meq/L (ref 3.5–5.1)
Sodium: 139 mEq/L (ref 135–145)

## 2014-06-26 LAB — LDL CHOLESTEROL, DIRECT: Direct LDL: 111.5 mg/dL

## 2014-06-26 LAB — POCT INR: INR: 1.1

## 2014-06-26 MED ORDER — WARFARIN SODIUM 5 MG PO TABS: ORAL_TABLET | ORAL | Status: DC

## 2014-06-26 MED ORDER — HYDRALAZINE HCL 25 MG PO TABS: 12.5000 mg | ORAL_TABLET | Freq: Three times a day (TID) | ORAL | Status: DC

## 2014-06-26 MED ORDER — ISOSORBIDE DINITRATE 10 MG PO TABS: 10.0000 mg | ORAL_TABLET | Freq: Three times a day (TID) | ORAL | Status: DC

## 2014-06-26 NOTE — Assessment & Plan Note (Signed)
Johnathan Archer presents today. He is somewhat frustrated that his cardiac function has not improved.  He's been noncompliant in the past the patient is committed to staying on his medications.  I explained that his noncompliance has hampered his improvement.    I told him that it would take a lot of effort on him to stay on his medications and to make it to appointment. We had long discussion about the effects of hypertension on his dilated cardiomyopathy.  We'll add hydralazine 25 mg tablets-one half tablet 3 times a day and Isordil 10 mg 3 times a day. I'll see him again in one month. Will anticipate increasing his carvedilol at that time. Following that we anticipate starting him on Aldactone. Will need to continue to up titrate his medications in an effort to get his blood pressure under control. Will anticipate getting an echocardiogram in the next 6 months or so.  If he does not have any further episodes of atrial fibrillation however probably discontinue his amiodarone if his LV function is improved.

## 2014-06-26 NOTE — Telephone Encounter (Addendum)
°  Patient dismissed from [facility] by Nicki Reaper NP-C , effective June 26, 2014. Dismissal letter sent out by certified / registered mail.  DAJ  Received signed domestic return receipt verifying delivery of certified letter on July 17, 2014. Article number [id number] [phone] DAJ

## 2014-06-26 NOTE — Telephone Encounter (Signed)
Patient did not come in for their appointment today for 1 month follow up  Please let me know if patient needs to be contacted immediately for follow up or no follow up needed. °

## 2014-06-26 NOTE — Patient Instructions (Signed)
Your physician has recommended you make the following change in your medication:  1) START taking Hydralazine 25mg -  Take 1/2 TAB = 12.5MG  three times daily 2) START taking Isordil Dinitrate 10mg  three times daily  Your physician recommends that you return for lab work today (TSH, BMET, Lipid Panel, Hepatic Panel)  Your physician recommends that you schedule a follow-up appointment in: 1 month with Dr. Elease Hashimoto.

## 2014-06-26 NOTE — Telephone Encounter (Signed)
Pt is being discharged for multiple no shows, no follow up needed

## 2014-06-26 NOTE — Progress Notes (Signed)
Johnathan Archer Date of Birth  03-30-1971 [facility]     [facility]  [address]    [address]   [address] [place], [place]  [address]    [place], [place]  [address] [phone]  Fax  [phone]  [phone]  Fax [phone]  Problems: 1. Congestive heart failure, EF equals 35%, nonobstructive coronary artery disease by cath in 2004 ( former patient of Johnathan Archer) 2. Hypertension 3. Diabetes mellitus-type II 4. Sleep apnea 5. Atrial fibrillation   History of Present Illness:  Johnathan Archer to 44 year old gentleman with a history of idiopathic dilated cardiomyopathy since 2004. He had a cardiac catheterization at that time which revealed nonobstructive coronary artery disease.  He's been on medical therapy since that time.  Recently he's noticed lots of fatigue and also lots of leg swelling.  He does avoid eating fried foods or salty foods.  December 31, 2012:  Johnathan Archer presents with atrial fib with RVR.  He was diagnosed in May 2013.  He typically runs out of his meds - ran out of coreg 3 days ago.  He has not been to coumadin clinic.  He takes 1 coumadin a day ( 5 mg).  Last INR is 1.27.    He has DOE, extreme fatigue.    He tries to avoid salt.  He has been forcing down lots of water thinking that he is helping himself.  He has been to the [facility].    September 13, 2013:  Johnathan Archer has been doing well. He's been getting some regular exercise. He recently joined a gym.     He missed his medicines yesterday. As a result on his blood pressure is fairly high today.  He still eats some canned foods.  He tries to eat health food at work.  He is a Naval architect.   He has is on her levels checked through our Coumadin clinic. Most of his INR  levels are subtherapeutic.  November 28, 2013:  He has run out of his meds - AGAIN.  BP is high.  Doing well.  Some dyspnea. Sleeping OK His has A-fib - is on coumadin.  Has joined a  gym - losing some weight.   Has some ED.   Jan. 7, 2016:  Johnathan Archer is a 44 yo CHF, sleep anpea, afib and noncompliance. He frequently runs out of his medications. He may have been eating more salt than he should.  No chest pain.   Has some discomfort/ Does not think that his heart is getting stronger.    Current Outpatient Prescriptions on File Prior to Visit  Medication Sig Dispense Refill  . amiodarone (PACERONE) 200 MG tablet Take 1 tablet (200 mg total) by mouth daily. 90 tablet 3  . carvedilol (COREG) 12.5 MG tablet Take 1 tablet (12.5 mg total) by mouth 2 (two) times daily. 180 tablet 3  . furosemide (LASIX) 40 MG tablet TAKE ONE TABLET BY MOUTH TWICE DAILY 120 tablet 0  . glipiZIDE (GLUCOTROL) 10 MG tablet Take 1 tablet (10 mg total) by mouth 2 (two) times daily before a meal. 60 tablet 5  . Insulin Glargine (LANTUS) 100 UNIT/ML Solostar Pen Inject 40 Units into the skin daily at 10 pm. 15 mL 1  . Insulin Pen Needle (PEN NEEDLES) 31G X 6 MM MISC Use as directed 50 each 0  . metFORMIN (GLUCOPHAGE) 500 MG tablet Take 1 tablet (500 mg total) by mouth 2 (two) times  daily with a meal. 180 tablet 1  . sildenafil (REVATIO) 20 MG tablet Take 60-100 mg by mouth daily.    Marland Kitchen spironolactone (ALDACTONE) 25 MG tablet Take 1 tablet (25 mg total) by mouth daily. 30 tablet 1  . warfarin (COUMADIN) 5 MG tablet Take as directed by Anticoagulation clinic 60 tablet 0   No current facility-administered medications on file prior to visit.    No Known Allergies  Past Medical History  Diagnosis Date  . Hypertension   . Type II or unspecified type diabetes mellitus without mention of complication, uncontrolled   . 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  cath 2004 negative for obstruction, last echo 07/2011 EF 35%. b) Normal coronary arteries by cath in 2004 (when EF was noted at 15%). c) Last EF 20% by echo 12/2012  . History of  chicken pox   . OSA (obstructive sleep apnea)   . Mitral regurgitation     Moderate by echo 07/2011  . ED (erectile dysfunction)   . Atrial fibrillation with RVR 10/2011    lone episode, converted with IV dilt, on coumadin given elevated CHADS2  . Warfarin anticoagulation     Past Surgical History  Procedure Laterality Date  . Hernia repair  2002  . Knee surgery      L knee in HS  . Cardiac catheterization  2004    no blockages  . US echocardiography  07/2011    mod LVH, EF 35%, diffuse hypokinesis of entire myocardium, irreversible restrictive pattern (grade 4 diastolic dysfunction), mod MR, mod dilated LA/RA, decreased RV systolic fxn, no PFO  . US echocardiography  12/2012    severe LVH, EF 20-25%, mod MR, reduced RV systolic function    History  Smoking status  . Never Smoker   Smokeless tobacco  . Never Used    History  Alcohol Use No    Comment: Occasional 1x/mo    Family History  Problem Relation Age of Onset  . Diabetes Mother   . Hypertension Father   . Stroke Neg Hx   . Coronary artery disease Neg Hx   . Cancer Neg Hx     Reviw of Systems:  Reviewed in the HPI.  All other systems are negative.  Physical Exam: Blood pressure 160/106, pulse 81, height  (1.803 m), weight 225 lb 6.4 oz (102.241 kg), SpO2 97 %. General: Well developed, well nourished, in no acute distress.  Head: Normocephalic, atraumatic, sclera non-icteric, mucus membranes are moist,   Neck: Supple. Carotids are 2 + without bruits. No JVD  Lungs: Clear bilaterally to auscultation.  Heart: His heart is irregularly irregular. He is very tachycardic.Marland Kitchen  normal  S1 S2. No murmurs, gallops or rubs.  Abdomen: Soft, non-tender, non-distended with normal bowel sounds. No hepatomegaly. No rebound/guarding. No masses.  Msk:  Strength and tone are normal  Extremities: No clubbing or cyanosis. No edema.  Distal pedal pulses are 2+ and equal bilaterally.  Neuro: Alert and oriented X 3.  Moves all extremities spontaneously.  Psych:  Responds to questions appropriately with a normal affect.  ECG:  Assessment / Plan:

## 2014-07-02 ENCOUNTER — Telehealth: Payer: Self-pay | Admitting: Cardiovascular Disease

## 2014-07-02 NOTE — Telephone Encounter (Signed)
Spoke with patient and reviewed lab results; patient verbalized understanding.  Patient states he is aware that glucose is elevated and will follow-up with his PCP.  Patient requests refill of Glipizide and I advised him that this medication needs to be refilled by PCP or physician he sees for diabetes.

## 2014-07-02 NOTE — Telephone Encounter (Signed)
New Msg ° ° ° ° ° ° °Pt returning call. ° ° °Please call back. °

## 2014-07-03 ENCOUNTER — Encounter: Payer: Self-pay | Admitting: Endocrinology

## 2014-07-16 ENCOUNTER — Ambulatory Visit (INDEPENDENT_AMBULATORY_CARE_PROVIDER_SITE_OTHER): Payer: BC Managed Care – PPO | Admitting: Endocrinology

## 2014-07-16 ENCOUNTER — Encounter: Payer: Self-pay | Admitting: Endocrinology

## 2014-07-16 VITALS — BP 168/93 | HR 78 | Temp 99.5°F | Resp 12 | Ht 71.0 in | Wt 227.0 lb

## 2014-07-16 DIAGNOSIS — IMO0002 Reserved for concepts with insufficient information to code with codable children: Secondary | ICD-10-CM

## 2014-07-16 DIAGNOSIS — E785 Hyperlipidemia, unspecified: Secondary | ICD-10-CM

## 2014-07-16 DIAGNOSIS — I10 Essential (primary) hypertension: Secondary | ICD-10-CM

## 2014-07-16 DIAGNOSIS — E1165 Type 2 diabetes mellitus with hyperglycemia: Secondary | ICD-10-CM

## 2014-07-16 DIAGNOSIS — E049 Nontoxic goiter, unspecified: Secondary | ICD-10-CM

## 2014-07-16 NOTE — Patient Instructions (Signed)
Leave off Glipizide  Metformin 2 at lunch for 3-4 days then add 1 at dinner then another 1 at dinner a week later, total 4 final dose  Please check blood sugars at least half the time about 2 hours after any meal and 3 times per week on waking up. Please bring blood sugar monitor to each visit. Recommended blood sugar levels about 2 hours after meal is 140-180 and on waking up 90-130  Start exercise at least 2x per week  V-Go pump, use 3 clicks with each main meal  Levemir 25 units twice daily for now, do not take am Levemir when starting pump

## 2014-07-16 NOTE — Progress Notes (Signed)
Patient ID: Johnathan Archer, male   DOB: 26-Sep-1970, 44 y.o.   MRN: [id number]           Reason for Appointment: Consultation for Type 2 Diabetes  Referring physician: Sampson Si  History of Present Illness:          Diagnosis: Type 2 diabetes mellitus, date of diagnosis: 2006      Past history:   He had symptoms of high blood sugars at the time of diagnosis with extreme thirst and fatigue. He also had a weight of about 250 pounds.  Details of this are not available as he was treated by another physician. He probably had been on metformin and glipizide for several years with variable control However for at least the last 3 years A1c has been fairly high, usually over 9-10% He was admitted to the hospital in 06/2013 and at that time because of his high blood sugars he was started on Levemir insulin 40 units in the morning. He had been followed periodically for his diabetes and although his A1c had improved initially to 7.9 it has been significantly higher subsequently  Recent history:  He continues to be on Levemir 40 units a day as well as glipizide and metformin for the last year The dose of Levemir has not been adjusted over the last year. However he does admit that he does not take the insulin daily because of cost Also instead of taking metformin and glipizide twice a day he is taking only one tablet today in the afternoons. He has not had any education regarding diet and has not been able to loose weight He thinks he is generally watching his diet and avoiding high fat meals but does frequently use sweet tea since he works at [employer] He does tend to get tired and has to get up at night twice to go to the bathroom No recent blurred vision or significant weight loss. He checks his blood sugar up to once a day, usually in the morning and sometimes at lunch and these are consistently over 200 Lab tests also indicate glucose readings recently between 220 and 285       Oral  hypoglycemic drugs the patient is taking are:   glipizide 500 mg and glipizide 10 mg at lunch     Side effects from medications have been: None INSULIN regimen is described as: 40 Levemir in a.m.   Compliance with the medical regimen: Poor Hypoglycemia:    Glucose monitoring:  done one time a day or less         Glucometer: One Touch ultra mini .      Blood Glucose readings by recall  PREMEAL Breakfast Lunch Dinner Bedtime  Overall   Glucose range: 250 250     Median:        Self-care: The diet that the patient has been following is: tries to limit fat intake but does not avoid sweet tea.     Meals: 3 meals per day. Breakfast is usually scrambled eggs, lunch will be sometimes a salad with chicken, has mixed meal at dinner           Exercise: Active at work, no formal exercise usually           Dietician visit, most recent: At diagnosis only .               Weight history:  Wt Readings from Last 3 Encounters:  07/16/14 227 lb (102.967 kg)  06/26/14 225  lb 6.4 oz (102.241 kg)  05/12/14 228 lb (103.42 kg)    Glycemic control:   Lab Results  Component Value Date   HGBA1C 8.9* 05/12/2014   HGBA1C 10.1* 01/21/2014   HGBA1C 7.9* 10/04/2013   Lab Results  Component Value Date   MICROALBUR 5.1* 10/04/2013   LDLCALC 85 05/12/2014   CREATININE 1.3 06/26/2014         Medication List       This list is accurate as of: 07/16/14  9:25 AM.  Always use your most recent med list.               amiodarone 200 MG tablet  Commonly known as:  PACERONE  Take 1 tablet (200 mg total) by mouth daily.     carvedilol 12.5 MG tablet  Commonly known as:  COREG  Take 1 tablet (12.5 mg total) by mouth 2 (two) times daily.     furosemide 40 MG tablet  Commonly known as:  LASIX  TAKE ONE TABLET BY MOUTH TWICE DAILY     glipiZIDE 10 MG tablet  Commonly known as:  GLUCOTROL  Take 1 tablet (10 mg total) by mouth 2 (two) times daily before a meal.     hydrALAZINE 25 MG tablet    Commonly known as:  APRESOLINE  Take 0.5 tablets (12.5 mg total) by mouth 3 (three) times daily.     Insulin Glargine 100 UNIT/ML Solostar Pen  Commonly known as:  LANTUS  Inject 40 Units into the skin daily at 10 pm.     isosorbide dinitrate 10 MG tablet  Commonly known as:  ISORDIL  Take 1 tablet (10 mg total) by mouth 3 (three) times daily.     LEVEMIR FLEXTOUCH 100 UNIT/ML Pen  Generic drug:  Insulin Detemir     lisinopril 20 MG tablet  Commonly known as:  PRINIVIL,ZESTRIL  Take 20 mg by mouth daily.     metFORMIN 500 MG tablet  Commonly known as:  GLUCOPHAGE  Take 1 tablet (500 mg total) by mouth 2 (two) times daily with a meal.     Pen Needles 31G X 6 MM Misc  Use as directed     sildenafil 20 MG tablet  Commonly known as:  REVATIO  Take 60-100 mg by mouth daily.     spironolactone 25 MG tablet  Commonly known as:  ALDACTONE  Take 1 tablet (25 mg total) by mouth daily.     warfarin 5 MG tablet  Commonly known as:  COUMADIN  Take as directed by Anticoagulation clinic        Allergies: No Known Allergies  Past Medical History  Diagnosis Date  . Hypertension   . Type II or unspecified type diabetes mellitus without mention of complication, uncontrolled   . 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  cath 2004 negative for obstruction, last echo 07/2011 EF 35%. b) Normal coronary arteries by cath in 2004 (when EF was noted at 15%). c) Last EF 20% by echo 12/2012  . History of chicken pox   . OSA (obstructive sleep apnea)   . Mitral regurgitation     Moderate by echo 07/2011  . ED (erectile dysfunction)   . Atrial fibrillation with RVR 10/2011    lone episode, converted with IV dilt, on coumadin given elevated CHADS2  . Warfarin anticoagulation     Past Surgical History  Procedure Laterality Date  .  Hernia repair  2002  . Knee surgery      L knee in HS  . Cardiac catheterization  2004     no blockages  . US echocardiography  07/2011    mod LVH, EF 35%, diffuse hypokinesis of entire myocardium, irreversible restrictive pattern (grade 4 diastolic dysfunction), mod MR, mod dilated LA/RA, decreased RV systolic fxn, no PFO  . US echocardiography  12/2012    severe LVH, EF 20-25%, mod MR, reduced RV systolic function    Family History  Problem Relation Age of Onset  . Diabetes Mother   . Hypertension Father   . Stroke Neg Hx   . Coronary artery disease Neg Hx   . Cancer Neg Hx     Social History:  reports that he has never smoked. He has never used smokeless tobacco. He reports that he does not drink alcohol or use illicit drugs.    Review of Systems       Vision is normal. Most recent eye exam was 12/15 and was told he had some signs of diabetes.  Has not seen a retinal specialist      No unusual headaches       Lipids: He has been followed by his cardiologist, currently not on any medications for this       Lab Results  Component Value Date   CHOL 191 06/26/2014   HDL 40.30 06/26/2014   LDLCALC 85 05/12/2014   LDLDIRECT 111.5 06/26/2014   TRIG 223.0* 06/26/2014   CHOLHDL 5 06/26/2014                  Skin: No rash or infections     Thyroid:  fatigue present, has had thyroid levels monitored periodically because of taking amiodarone.  Lab Results  Component Value Date   TSH 0.90 06/26/2014   TSH 1.21 11/28/2013   TSH 0.462 07/05/2013       The blood pressure has been controlled with carvedilol, hydralazine and lisinopril Also takes Aldactone 25 mg and 40 mg Lasix for history of CHF  No recent history of palpitations.  Has been on medications for atrial fibrillation including Coumadin No recent chest pain.     No swelling of feet.     No shortness of breath or chest tightness on exertion.     Bowel habits: Normal.      No joint  pains.          No history of Numbness, tingling or burning in feet       Urine stream is normal, getting up  twice at night to urinate   LABS:  No visits with results within 1 Week(s) from this visit. Latest known visit with results is:  Anti-coag visit on 06/26/2014  Component Date Value Ref Range Status  . INR 06/26/2014 1.1   Final    Physical Examination:  BP 168/93 mmHg  Pulse 78  Temp(Src) 99.5 F (37.5 C) (Oral)  Resp 12  Ht 5\' 11"  (1.803 m)  Wt 227 lb (102.967 kg)  BMI 31.67 kg/m2  SpO2 98%  GENERAL:         Patient has abdominal obesity.   HEENT:         Eye exam shows normal external appearance. Fundus exam shows no retinopathy. Oral exam shows normal mucosa .  NECK:         General:  Neck exam shows no lymphadenopathy. Carotids are normal to palpation and no bruit heard.  Thyroid is  enlarged about 1-1/2-2 times normal mostly on the right side, firm and smooth and no nodules felt.   LUNGS:         Chest is symmetrical. Lungs are clear to auscultation.Marland Kitchen   HEART:         Heart sounds:  S1 and S2 are normal. No murmurs or clicks heard., no S3 or S4.   ABDOMEN:   There is no distention present. Liver and spleen are not palpable. No other mass or tenderness present.  EXTREMITIES:     There is no edema. No skin lesions present.Marland Kitchen  NEUROLOGICAL:   Vibration sense is mildly reduced in toes on the left and moderately on the right. Ankle jerks are absent bilaterally. biceps reflexes are normal. Diabetic foot exam shows normal monofilament sensation in the toes and plantar surfaces, no skin lesions or ulcers on the feet and normal pedal pulses  MUSCULOSKELETAL:       There is no enlargement or deformity of the joints.  Thoracic and lumbar spine  normal to inspection.Marland Kitchen   SKIN:    No rash, abnormal pigmentation or lesions of concern.        ASSESSMENT:  Diabetes type 2, uncontrolled with abdominal obesity    He has consistently poor control of his diabetes with taking metformin, glipizide and basal insulin Most likely has significant insulin deficiency along with insulin resistance He  is poorly compliant with his treatment regimen partly because of his work routine as well as difficulty affording his co-pay for insulin. He does need mealtime insulin to control postprandial hyperglycemia but doubt if he can comply with this because of his work schedule and eating at work most of the time To improve his compliance and control as well as keep his treatment regimen simple he is a good candidate for the V-go pump This was demonstrated to him and explained in detail how this would be used. He is willing to improve his control and follow instructions for better compliance from now on Also needs the following:  More consistent glucose monitoring especially after meals  Exercise regimen on his days off  Consultation with the dietitian for meal planning  Maximum dose of metformin  Complications: Has only minimal signs and symptoms of neuropathy, no history of nephropathy, not clear if he has had any signs of retinopathy  HYPERLIPIDEMIA: He has a high direct LDL of over 100 and currently not on a statin drug  Small goiter on exam  History of hypertension: Followed by cardiologist and PCP.  The pressure is relatively high in the office today and will defer management to cardiologist  PLAN:   For now he will take 25 units of Levemir twice a day  Start increasing metformin and given instructions on how to titrate up to 2000 mg a day.  For simplicity will have him switch to ER preparation on his next prescription  He may stop glipizide  Consider adding Invokana for improved control  Diabetes nurse educator will start him on a V-go pump next week, most likely he can get by with the 30 units pump unless his fasting blood sugars are still high using 50 units of Levemir day.  Also he will need to be doing boluses with at least 4-6 units for each meal with carbohydrates.  Given him brochure on the pump and he will also verifies insurance coverage as well as get more information on  the website  Will also schedule appointment with nutritionist and he will attend with  his wife.  For his goiter may consider doing an ultrasound for further evaluation, will decide on his next visit after reexamination Recommend that he be on a statin drug for cardiovascular prevention since his direct LDL is over 100  Counseling time over 50% of today's 60 minute visit  Layn Kye 07/16/2014, 9:25 AM   Note: This office note was prepared with Dragon voice recognition system technology. Any transcriptional errors that result from this process are unintentional.

## 2014-07-21 ENCOUNTER — Encounter: Payer: BC Managed Care – PPO | Attending: Endocrinology | Admitting: Nutrition

## 2014-07-21 DIAGNOSIS — Z794 Long term (current) use of insulin: Secondary | ICD-10-CM | POA: Diagnosis not present

## 2014-07-21 DIAGNOSIS — E1165 Type 2 diabetes mellitus with hyperglycemia: Secondary | ICD-10-CM | POA: Diagnosis present

## 2014-07-21 DIAGNOSIS — Z713 Dietary counseling and surveillance: Secondary | ICD-10-CM | POA: Insufficient documentation

## 2014-07-21 DIAGNOSIS — IMO0002 Reserved for concepts with insufficient information to code with codable children: Secondary | ICD-10-CM

## 2014-07-21 NOTE — Progress Notes (Signed)
Pt. Is here to begin the V-go.  He reported that his FBS was 85 today and he was shakey.  He says it was 90 at HS last night.   He is taking his Levemir 25u BID, and increased his Metformin to 1000mg  BID, with severe diarrhea last night.  He had not been taking his Metformin due to large copay, and says that he started back on 1000mg  BID as directed by Dr. Lucianne Muss  He has changed his diet considerably--reducing the carbs at every meal, and decreasing the sweet drinks--of which he was having 2-3 times/day.  He manages a restaurant, and was " eating what ever he wanted, whenever he wanted", until 3 days ago.    New diet: Bfast: 9AM:  2 boiled eggs, and a box of fruit juice (feeling shakey).  Was having egg and cheese biscuit at [employer] with sweet tea. Lunch: 3PM: grilled chicken with salad and much dressing, 2-3 veg. (one is starchy), water to drink.   Was having fries, fried chicken, or fried fish, or large Burger Supper: 7PM:  Grilled fish with 2 veg. (one starch).  Water to drink.   9-10PM  Would have desert, now not eating anything,   He does not want to go on this V-go--saying that he can continue this eating until his blood sugars come down, and he looses weight.    We reviewed the pathophysiology of his diabetes, and ways he can make his insulin receptors work better.  He is willing to walk briskly for 30-30min, and balance his meals with reducing meat portion sizes and stopping sweet drinks, and juices, and eat 2-3 servings of carbs at each meal.  He appears very motivated at this time.  We discussed the need to test his blood sugars around 2 meals both before and 2 hours after.  Goals for those were given to him (before: less than 110, and 2hr. After: less than 160).  Discussed the idea that his own insulin will be covering his blood sugar rise after the meal, and if still high with diet changes, he will need the V-go.  His blood sugar 2hr. After breakfast today with 1 carb choice was 135mg .   Discussed the importance of having 2-3 servings of carbs at each meal.  He reported good understanding of this, as well as the need for 5-7 pound weight loss, and stopping the reducing the fats at each meal.    He agreed to test around 2 meals for 1 week and come back and see me.  If 2hr. pc readings are higher than goal, he will start the V-Go.  He was shown it, and we discussed how it worked.  He agreed to these terms and an appt. Was made for 1 week  He was given sample Ultra test strips to allow for the needed blood sugar readings this week. He had no final questions.

## 2014-07-21 NOTE — Patient Instructions (Signed)
Have 2-3 servings of carbohydrates at every meal. Test blood sugars before and 2hr. After 2 meals each day--alternating the meals each day. Stop sweet drinks and fruit juices Limit biscuits to once a week Limit the amount of salad dressings and cheese sauces at each meal.   Walk briskly for 30-40 min. 4-5 days/wk.

## 2014-07-25 ENCOUNTER — Ambulatory Visit: Payer: BC Managed Care – PPO | Admitting: Cardiovascular Disease

## 2014-07-29 ENCOUNTER — Encounter: Payer: BC Managed Care – PPO | Admitting: Nutrition

## 2014-07-30 ENCOUNTER — Ambulatory Visit: Payer: BC Managed Care – PPO | Admitting: Endocrinology

## 2014-08-11 ENCOUNTER — Encounter: Payer: Self-pay | Admitting: Cardiovascular Disease

## 2014-08-11 ENCOUNTER — Ambulatory Visit (INDEPENDENT_AMBULATORY_CARE_PROVIDER_SITE_OTHER): Payer: BC Managed Care – PPO

## 2014-08-11 ENCOUNTER — Ambulatory Visit (INDEPENDENT_AMBULATORY_CARE_PROVIDER_SITE_OTHER): Payer: BC Managed Care – PPO | Admitting: Cardiovascular Disease

## 2014-08-11 VITALS — BP 150/120 | HR 68 | Ht 71.0 in | Wt 229.4 lb

## 2014-08-11 DIAGNOSIS — I420 Dilated cardiomyopathy: Secondary | ICD-10-CM

## 2014-08-11 DIAGNOSIS — I10 Essential (primary) hypertension: Secondary | ICD-10-CM

## 2014-08-11 DIAGNOSIS — Z7901 Long term (current) use of anticoagulants: Secondary | ICD-10-CM

## 2014-08-11 DIAGNOSIS — I5042 Chronic combined systolic (congestive) and diastolic (congestive) heart failure: Secondary | ICD-10-CM

## 2014-08-11 DIAGNOSIS — I4891 Unspecified atrial fibrillation: Secondary | ICD-10-CM

## 2014-08-11 LAB — POCT INR: INR: 1.6

## 2014-08-11 MED ORDER — HYDRALAZINE HCL 25 MG PO TABS: 25.0000 mg | ORAL_TABLET | Freq: Three times a day (TID) | ORAL | Status: DC

## 2014-08-11 MED ORDER — ISOSORBIDE DINITRATE 20 MG PO TABS: 20.0000 mg | ORAL_TABLET | Freq: Three times a day (TID) | ORAL | Status: DC

## 2014-08-11 NOTE — Progress Notes (Signed)
Cardiology Office Note   Date:  08/11/2014   ID:  Johnathan Archer, DOB [birth year], MRN [id number]  PCP:  Nicki Reaper, NP  Cardiologist:   Vesta Mixer, MD   Chief Complaint  Patient presents with  . Congestive Heart Failure   1. Congestive heart failure, EF equals 35%, nonobstructive coronary artery disease by cath in 2004 ( former patient of Donnie Aho) 2. Hypertension 3. Diabetes mellitus-type II 4. Sleep apnea 5. Atrial fibrillation   History of Present Illness:  Johnathan Archer to 44 year old gentleman with a history of idiopathic dilated cardiomyopathy since 2004. He had a cardiac catheterization at that time which revealed nonobstructive coronary artery disease. He's been on medical therapy since that time.  Recently he's noticed lots of fatigue and also lots of leg swelling. He does avoid eating fried foods or salty foods.  December 31, 2012:  Johnathan Archer presents with atrial fib with RVR. He was diagnosed in May 2013. He typically runs out of his meds - ran out of coreg 3 days ago. He has not been to coumadin clinic. He takes 1 coumadin a day ( 5 mg). Last INR is 1.27.   He has DOE, extreme fatigue.   He tries to avoid salt. He has been forcing down lots of water thinking that he is helping himself. He has been to the [facility].   September 13, 2013:  Johnathan Archer has been doing well. He's been getting some regular exercise. He recently joined a gym. He missed his medicines yesterday. As a result on his blood pressure is fairly high today.  He still eats some canned foods. He tries to eat health food at work. 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 Most of his INR levels are subtherapeutic.  November 28, 2013:  He has run out of his meds - AGAIN. BP is high. Doing well. Some dyspnea. Sleeping OK His has A-fib - is on coumadin. Has joined a gym - losing some weight.  Has  some ED.   Jan. 7, 2016:  Johnathan Archer is a 44 yo CHF, sleep anpea, afib and noncompliance. He frequently runs out of his medications. He may have been eating more salt than he should.  No chest pain. Has some discomfort/ Does not think that his heart is getting stronger.    History of Present Illness: Johnathan Archer is a 44 y.o. male who presents for follow up of HTN and CHF BP is very high today. Has been taking his meds.  No   dyspnea.  Has occasional chest discomfort -  Sometimes caused by exertion.  Last a few minutes.  Has been going on for a month  No radiation.  Not associated with dyspnea.     Past Medical History  Diagnosis Date  . Hypertension   . Type II or unspecified type diabetes mellitus without mention of complication, uncontrolled   . 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  cath 2004 negative for obstruction, last echo 07/2011 EF 35%. b) Normal coronary arteries by cath in 2004 (when EF was noted at 15%). c) Last EF 20% by echo 12/2012  . History of chicken pox   . OSA (obstructive sleep apnea)   . Mitral regurgitation     Moderate by echo 07/2011  . ED (erectile dysfunction)   . Atrial fibrillation with RVR 10/2011  lone episode, converted with IV dilt, on coumadin given elevated CHADS2  . Warfarin anticoagulation     Past Surgical History  Procedure Laterality Date  . Hernia repair  2002  . Knee surgery      L knee in HS  . Cardiac catheterization  2004    no blockages  . US echocardiography  07/2011    mod LVH, EF 35%, diffuse hypokinesis of entire myocardium, irreversible restrictive pattern (grade 4 diastolic dysfunction), mod MR, mod dilated LA/RA, decreased RV systolic fxn, no PFO  . US echocardiography  12/2012    severe LVH, EF 20-25%, mod MR, reduced RV systolic function     Current Outpatient Prescriptions  Medication Sig Dispense Refill  . amiodarone (PACERONE)  200 MG tablet Take 1 tablet (200 mg total) by mouth daily. 90 tablet 3  . carvedilol (COREG) 12.5 MG tablet Take 1 tablet (12.5 mg total) by mouth 2 (two) times daily. 180 tablet 3  . furosemide (LASIX) 40 MG tablet TAKE ONE TABLET BY MOUTH TWICE DAILY 120 tablet 0  . glipiZIDE (GLUCOTROL) 10 MG tablet Take 1 tablet (10 mg total) by mouth 2 (two) times daily before a meal. 60 tablet 5  . hydrALAZINE (APRESOLINE) 25 MG tablet Take 0.5 tablets (12.5 mg total) by mouth 3 (three) times daily. 45 tablet 11  . Insulin Glargine (LANTUS) 100 UNIT/ML Solostar Pen Inject 40 Units into the skin daily at 10 pm. 15 mL 1  . Insulin Pen Needle (PEN NEEDLES) 31G X 6 MM MISC Use as directed 50 each 0  . isosorbide dinitrate (ISORDIL) 10 MG tablet Take 1 tablet (10 mg total) by mouth 3 (three) times daily. 90 tablet 11  . LEVEMIR FLEXTOUCH 100 UNIT/ML Pen   0  . lisinopril (PRINIVIL,ZESTRIL) 20 MG tablet Take 20 mg by mouth daily.    . metFORMIN (GLUCOPHAGE) 500 MG tablet Take 1 tablet (500 mg total) by mouth 2 (two) times daily with a meal. 180 tablet 1  . sildenafil (REVATIO) 20 MG tablet Take 60-100 mg by mouth daily.    Marland Kitchen spironolactone (ALDACTONE) 25 MG tablet Take 1 tablet (25 mg total) by mouth daily. 30 tablet 1  . warfarin (COUMADIN) 5 MG tablet Take as directed by Anticoagulation clinic 80 tablet 0   No current facility-administered medications for this visit.    Allergies:   Review of patient's allergies indicates no known allergies.    Social History:  The patient  reports that he has never smoked. He has never used smokeless tobacco. He reports that he does not drink alcohol or use illicit drugs.   Family History:  The patient's family history includes Diabetes in his mother; Hypertension in his father. There is no history of Stroke, Coronary artery disease, or Cancer.    ROS:  Please see the history of present illness.    Review of Systems: Constitutional:  denies fever, chills,  diaphoresis, appetite change and fatigue.  HEENT: denies photophobia, eye pain, redness, hearing loss, ear pain, congestion, sore throat, rhinorrhea, sneezing, neck pain, neck stiffness and tinnitus.  Respiratory: denies SOB, DOE, cough, chest tightness, and wheezing.  Cardiovascular: denies chest pain, palpitations and leg swelling.  Gastrointestinal: denies nausea, vomiting, abdominal pain, diarrhea, constipation, blood in stool.  Genitourinary: denies dysuria, urgency, frequency, hematuria, flank pain and difficulty urinating.  Musculoskeletal: denies  myalgias, back pain, joint swelling, arthralgias and gait problem.   Skin: denies pallor, rash and wound.  Neurological: denies dizziness, seizures, syncope, weakness, light-headedness,  numbness and headaches.   Hematological: denies adenopathy, easy bruising, personal or family bleeding history.  Psychiatric/ Behavioral: denies suicidal ideation, mood changes, confusion, nervousness, sleep disturbance and agitation.       All other systems are reviewed and negative.    PHYSICAL EXAM: VS:  BP 150/120 mmHg  Pulse 68  Ht 5\' 11"  (1.803 m)  Wt 229 lb 6.4 oz (104.055 kg)  BMI 32.01 kg/m2  SpO2 98% , BMI Body mass index is 32.01 kg/(m^2). GEN: Well nourished, well developed, in no acute distress HEENT: normal Neck: no JVD, carotid bruits, or masses Cardiac: RRR; no murmurs, rubs, or gallops,no edema  Respiratory:  clear to auscultation bilaterally, normal work of breathing GI: soft, nontender, nondistended, + BS MS: no deformity or atrophy Skin: warm and dry, no rash Neuro:  Strength and sensation are intact Psych: normal   EKG:  EKG is not ordered today. The ekg ordered today demonstrates    Recent Labs: 05/22/2014: Hemoglobin 13.4; Platelets 242; Pro B Natriuretic peptide (BNP) 345.7* 06/26/2014: ALT 26; BUN 19; Creatinine 1.3; Potassium 4.0; Sodium 139; TSH 0.90    Lipid Panel    Component Value Date/Time   CHOL 191  06/26/2014 1526   TRIG 223.0* 06/26/2014 1526   HDL 40.30 06/26/2014 1526   CHOLHDL 5 06/26/2014 1526   VLDL 44.6* 06/26/2014 1526   LDLCALC 85 05/12/2014 1624   LDLDIRECT 111.5 06/26/2014 1526      Wt Readings from Last 3 Encounters:  08/11/14 229 lb 6.4 oz (104.055 kg)  07/16/14 227 lb (102.967 kg)  06/26/14 225 lb 6.4 oz (102.241 kg)      Other studies Reviewed: Additional studies/ records that were reviewed today include: . Review of the above records demonstrates:    ASSESSMENT AND PLAN:  1. Congestive heart failure, EF equals 35%, nonobstructive coronary artery disease by cath in 2004 ( former patient of Donnie Aho) - his last echocardiogram revealed severely depressed left ventricular systolic function. We will continue to gradually up titrate his medications. I anticipate increasing his carvedilol at the next office visit.  2. Hypertension - his blood pressure remains markedly elevated today. We will increase his hydralazine to 25 mg 3 times a day and his Isordil dinitrate to 20 mg 3 times a day. He still eats a bit of extra salt. I've cautioned him about eating any exercise. I've also recommended that he start exercising on a regular basis. I'll see him in 2 weeks for follow-up office visit and basic medical profile.  3. Diabetes mellitus-type II  4. Sleep apnea-  we will make sure that he's wearing his CPAP mask each night.  5. Atrial fibrillation   Current medicines are reviewed at length with the patient today.  The patient does not have concerns regarding medicines.  The following changes have been made:  See above    Disposition:   FU with me in 2-3 weeks.    Signed, Cam Dauphin, Deloris Ping, MD  08/11/2014 9:14 AM    [facility] [address] [place], [place], [place]  [address] Phone: [phone]; Fax: [phone]

## 2014-08-11 NOTE — Patient Instructions (Signed)
Your physician has recommended you make the following change in your medication:  INCREASE Hydralazine to 25 mg three times daily INCREASE Isordil to 20 mg three times daily  Your physician recommends that you return for follow-up on Tuesday, March 8 at 9:30 am  Your physician recommends that you return for lab work on the same day as your appointment with Dr. Elease Hashimoto.

## 2014-08-14 ENCOUNTER — Other Ambulatory Visit: Payer: Self-pay | Admitting: Cardiovascular Disease

## 2014-08-26 ENCOUNTER — Ambulatory Visit: Payer: BC Managed Care – PPO | Admitting: Cardiovascular Disease

## 2014-10-03 ENCOUNTER — Ambulatory Visit (INDEPENDENT_AMBULATORY_CARE_PROVIDER_SITE_OTHER): Payer: BLUE CROSS/BLUE SHIELD

## 2014-10-03 DIAGNOSIS — I4891 Unspecified atrial fibrillation: Secondary | ICD-10-CM

## 2014-10-03 DIAGNOSIS — Z7901 Long term (current) use of anticoagulants: Secondary | ICD-10-CM

## 2014-10-03 LAB — POCT INR: INR: 2.4

## 2014-10-14 ENCOUNTER — Other Ambulatory Visit: Payer: Self-pay | Admitting: Cardiovascular Disease

## 2014-12-08 ENCOUNTER — Telehealth: Payer: Self-pay | Admitting: *Deleted

## 2014-12-08 ENCOUNTER — Encounter: Payer: Self-pay | Admitting: *Deleted

## 2014-12-08 NOTE — Telephone Encounter (Signed)
called for fm hx & status... 

## 2014-12-10 ENCOUNTER — Ambulatory Visit: Payer: BLUE CROSS/BLUE SHIELD | Admitting: Cardiovascular Disease

## 2015-02-24 ENCOUNTER — Other Ambulatory Visit: Payer: Self-pay | Admitting: Cardiovascular Disease

## 2015-03-02 ENCOUNTER — Ambulatory Visit (INDEPENDENT_AMBULATORY_CARE_PROVIDER_SITE_OTHER): Payer: BLUE CROSS/BLUE SHIELD | Admitting: *Deleted

## 2015-03-02 DIAGNOSIS — I4891 Unspecified atrial fibrillation: Secondary | ICD-10-CM | POA: Diagnosis not present

## 2015-03-02 DIAGNOSIS — Z7901 Long term (current) use of anticoagulants: Secondary | ICD-10-CM

## 2015-03-02 LAB — POCT INR: INR: 1.3

## 2015-03-04 ENCOUNTER — Telehealth: Payer: Self-pay | Admitting: Cardiovascular Disease

## 2015-03-04 NOTE — Telephone Encounter (Signed)
Patient returned call. He has been experiencing intermittent chest pains/discomforts for the past 2-3 days. Each day pain getting sharper when it occurs. Patient denies swelling, SOB or dizziness, however he states that he feels that his equilibrium is off. He does not have or take NTG. He is wanting to be seen before the weekend. He is unable to come in tomorrow though due to a work commitment. Scheduled him with Wilburt Finlay, PA, for Friday, March 06, 2015, at 10:30 am. Advised patient to call 911 if he has worsening sx or new sx prior to his appointment Friday. He verbalized understanding and agreement.

## 2015-03-04 NOTE — Telephone Encounter (Signed)
New message  Pt c/o of Chest Pain: 1. Are you having CP right now? Not right now but sharp pains or discomfort here and there 2. Are you experiencing any other symptoms (ex. SOB, nausea, vomiting, sweating)? Pt states that he feels like his equilibrium is off//sr  3. How long have you been experiencing CP? 2-3 days  4. Is your CP continuous or coming and going? Coming and going continuous;y  5. Have you taken Nitroglycerin?no

## 2015-03-04 NOTE — Telephone Encounter (Signed)
There was no answer when calling patient's phone. Left message on cell phone voicemail for patient to call back.

## 2015-03-06 ENCOUNTER — Ambulatory Visit (INDEPENDENT_AMBULATORY_CARE_PROVIDER_SITE_OTHER): Payer: BLUE CROSS/BLUE SHIELD | Admitting: Physician Assistant

## 2015-03-06 ENCOUNTER — Encounter: Payer: Self-pay | Admitting: Physician Assistant

## 2015-03-06 VITALS — BP 168/100 | HR 66 | Ht 71.0 in | Wt 220.0 lb

## 2015-03-06 DIAGNOSIS — Z9119 Patient's noncompliance with other medical treatment and regimen: Secondary | ICD-10-CM

## 2015-03-06 DIAGNOSIS — I428 Other cardiomyopathies: Secondary | ICD-10-CM

## 2015-03-06 DIAGNOSIS — I480 Paroxysmal atrial fibrillation: Secondary | ICD-10-CM | POA: Diagnosis not present

## 2015-03-06 DIAGNOSIS — R0789 Other chest pain: Secondary | ICD-10-CM

## 2015-03-06 DIAGNOSIS — I420 Dilated cardiomyopathy: Secondary | ICD-10-CM | POA: Diagnosis not present

## 2015-03-06 DIAGNOSIS — I10 Essential (primary) hypertension: Secondary | ICD-10-CM

## 2015-03-06 DIAGNOSIS — Z91199 Patient's noncompliance with other medical treatment and regimen due to unspecified reason: Secondary | ICD-10-CM | POA: Insufficient documentation

## 2015-03-06 DIAGNOSIS — Z7901 Long term (current) use of anticoagulants: Secondary | ICD-10-CM

## 2015-03-06 DIAGNOSIS — I5042 Chronic combined systolic (congestive) and diastolic (congestive) heart failure: Secondary | ICD-10-CM

## 2015-03-06 DIAGNOSIS — E669 Obesity, unspecified: Secondary | ICD-10-CM

## 2015-03-06 DIAGNOSIS — I429 Cardiomyopathy, unspecified: Secondary | ICD-10-CM

## 2015-03-06 HISTORY — DX: Patient's noncompliance with other medical treatment and regimen: Z91.19

## 2015-03-06 HISTORY — DX: Patient's noncompliance with other medical treatment and regimen due to unspecified reason: Z91.199

## 2015-03-06 HISTORY — DX: Other chest pain: R07.89

## 2015-03-06 MED ORDER — ISOSORBIDE MONONITRATE ER 30 MG PO TB24: 15.0000 mg | ORAL_TABLET | Freq: Every day | ORAL | Status: DC

## 2015-03-06 MED ORDER — HYDRALAZINE HCL 50 MG PO TABS: 50.0000 mg | ORAL_TABLET | Freq: Two times a day (BID) | ORAL | Status: DC

## 2015-03-06 NOTE — Progress Notes (Signed)
Patient ID: Johnathan Archer, male   DOB: 1970/12/06, 44 y.o.   MRN: [id number]    Date:  03/06/2015   ID:  Johnathan Archer, DOB [birth year], MRN [id number]  PCP:  Nicki Reaper, NP  Primary Cardiologist:  Nahser   Chief complaint: Chest pain   History of Present Illness: [name] is a 44 y.o. male with a history of idiopathic dilated cardiomyopathy since 2004. He had a cardiac catheterization at that time which revealed nonobstructive coronary artery disease. He's been on medical therapy since that time.   December 31, 2012:  Johnathan Archer presents with atrial fib with RVR. He was diagnosed in May 2013. He typically runs out of his meds - ran out of coreg 3 days ago. He has not been to coumadin clinic. He takes 1 coumadin a day ( 5 mg). Last INR is 1.27.   He has DOE, extreme fatigue.   He tries to avoid salt. He has been forcing down lots of water thinking that he is helping himself. He has been to the [facility].   September 13, 2013:  [name] has been doing well. He's been getting some regular exercise. He recently joined a gym. He missed his medicines yesterday. As a result on his blood pressure is fairly high today.  He still eats some canned foods. He tries to eat health food at work. 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 Most of his INR levels are subtherapeutic.  November 28, 2013:  He has run out of his meds - AGAIN. BP is high. Doing well. Some dyspnea. Sleeping OK His has A-fib - is on coumadin. Has joined a gym - losing some weight.  Has some ED.   Jan. 7, 2016:  Johnathan Archer is a 44 yo CHF, sleep anpea, afib and noncompliance. He frequently runs out of his medications. He may have been eating more salt than he should.  No chest pain. Has some discomfort/ Does not think that his heart is getting stronger.    Aug 11, 2014   Johnathan Archer is  a 44 y.o. male who presents for follow up of HTN and CHF BP is very high today. Has been taking his meds.  No dyspnea.  Has occasional chest discomfort - Sometimes caused by exertion. Last a few minutes.  Has been going on for a month  No radiation. Not associated with dyspnea.   History of Present Illness: Patient presents today for complaints of sharp chest pain which is diffuse completely across his chest. He reports it lasts just a few seconds. He has none today. He denies any associated nausea, vomiting, diaphoresis, shortness of breath or radiation to his neck back or down his arms. He also reports that his equilibrium feels off. 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 He says usually his symptoms are mostly in the morning. No problems with acid reflux.  He has no acute EKG changes.  The patient currently denies  orthopnea, dizziness, PND, cough, congestion, abdominal pain, hematochezia, melena, lower extremity edema, claudication.  Wt Readings from Last 3 Encounters:  03/06/15 220 lb (99.791 kg)  08/11/14 229 lb 6.4 oz (104.055 kg)  07/16/14 227 lb (102.967 kg)     Past Medical History  Diagnosis Date  . Hypertension   . Type II or unspecified type diabetes mellitus without mention of complication, uncontrolled   . Systolic and diastolic CHF,  chronic 2004    a) Idiopathic dilated CM, 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  cath 2004 negative for obstruction, last echo 07/2011 EF 35%. b) Normal coronary arteries by cath in 2004 (when EF was noted at 15%). c) Last EF 20% by echo 12/2012  . History of chicken pox   . OSA (obstructive sleep apnea)   . Mitral regurgitation     Moderate by echo 07/2011  . ED (erectile dysfunction)   . Atrial fibrillation with RVR 10/2011    lone episode, converted with IV dilt, on coumadin given elevated CHADS2  . Warfarin anticoagulation     Current Outpatient Prescriptions  Medication Sig Dispense Refill  . amiodarone  (PACERONE) 200 MG tablet Take 1 tablet (200 mg total) by mouth daily. 90 tablet 3  . carvedilol (COREG) 12.5 MG tablet Take 1 tablet (12.5 mg total) by mouth 2 (two) times daily. 180 tablet 3  . furosemide (LASIX) 40 MG tablet TAKE ONE TABLET BY MOUTH TWICE DAILY 180 tablet 1  . glipiZIDE (GLUCOTROL) 10 MG tablet Take 1 tablet (10 mg total) by mouth 2 (two) times daily before a meal. 60 tablet 5  . hydrALAZINE (APRESOLINE) 50 MG tablet Take 1 tablet (50 mg total) by mouth 2 (two) times daily. 180 tablet 3  . Insulin Glargine (LANTUS) 100 UNIT/ML Solostar Pen Inject 40 Units into the skin daily at 10 pm. 15 mL 1  . Insulin Pen Needle (PEN NEEDLES) 31G X 6 MM MISC Use as directed 50 each 0  . LEVEMIR FLEXTOUCH 100 UNIT/ML Pen Inject 100 Units into the skin daily at 10 pm.   0  . lisinopril (PRINIVIL,ZESTRIL) 20 MG tablet Take 20 mg by mouth daily.    . metFORMIN (GLUCOPHAGE) 500 MG tablet Take 1 tablet (500 mg total) by mouth 2 (two) times daily with a meal. 180 tablet 1  . spironolactone (ALDACTONE) 25 MG tablet TAKE ONE TABLET BY MOUTH ONCE DAILY 90 tablet 1  . warfarin (COUMADIN) 5 MG tablet TAKE AS DIRECTED BY ANTICOAGULATION CLINIC FOR 30 DAYS 60 tablet 0  . isosorbide mononitrate (IMDUR) 30 MG 24 hr tablet Take 0.5 tablets (15 mg total) by mouth daily. 45 tablet 3   No current facility-administered medications for this visit.    Allergies:   No Known Allergies  Social History:  The patient  reports that he has never smoked. He has never used smokeless tobacco. He reports that he does not drink alcohol or use illicit drugs.   Family history:   Family History  Problem Relation Age of Onset  . Diabetes Mother   . Hypertension Father   . Stroke Neg Hx   . Coronary artery disease Neg Hx   . Cancer Neg Hx   . Heart attack Neg Hx     ROS:  Please see the history of present illness.  All other systems reviewed and negative.   PHYSICAL EXAM: VS:  BP 168/100 mmHg  Pulse 66  Ht   (1.803 m)  Wt 220 lb (99.791 kg)  BMI 30.70 kg/m2 Well nourished, well developed, in no acute distress HEENT: Pupils are equal round react to light accommodation extraocular movements are intact.  Neck: no JVDNo cervical lymphadenopathy. Cardiac: Regular rate and rhythm without murmurs rubs or gallops. Lungs:  clear to auscultation bilaterally, no wheezing, rhonchi or rales Abd: soft, nontender, positive bowel sounds all quadrants, no hepatosplenomegaly Ext: no lower extremity edema.  2+ radial and dorsalis pedis pulses. Skin: warm  and dry Neuro:  Grossly normal  EKG:   normal sinus rhythm rate 66 bpm. Lateral T-wave changes are old     ASSESSMENT AND PLAN:  Problem List Items Addressed This Visit    Systolic and diastolic CHF, chronic   Relevant Medications   hydrALAZINE (APRESOLINE) 50 MG tablet   isosorbide mononitrate (IMDUR) 30 MG 24 hr tablet   Paroxysmal atrial fibrillation   Relevant Medications   hydrALAZINE (APRESOLINE) 50 MG tablet   isosorbide mononitrate (IMDUR) 30 MG 24 hr tablet   Obesity (BMI 30-39.9)   Nonischemic cardiomyopathy - Primary   Relevant Medications   hydrALAZINE (APRESOLINE) 50 MG tablet   isosorbide mononitrate (IMDUR) 30 MG 24 hr tablet   Long term current use of anticoagulant therapy   Hypertension   Relevant Medications   hydrALAZINE (APRESOLINE) 50 MG tablet   isosorbide mononitrate (IMDUR) 30 MG 24 hr tablet   History of noncompliance with medical treatment   Chest pain, atypical     Chronic systolic and diastolic heart failure: Patient appears euvolemic. Weight is just a couple pounds since January. No signs or symptoms of volume overload.  Paroxysmal atrial fibrillation Maintaining normal sinus rhythm.  Atypical chest pain: No acute EKG changes. He's not had any pain today. No further workup  Obesity: We discussed weight loss low carb diet.  Chronic long-term use of anticoagulation:  Patient is status post taking Coumadin  however he is rarely therapeutic.  Medical noncompliance:  Again the patient is sporadic at best taking his cardiac meds. He is not therapeutic on his Coumadin his blood pressure is elevated. He's taking most of his twice a day and 3 time a day medicines only once. We discussed the complications of hypertension including end organ damage.   essential hypertension: I changed his hydralazine to twice a day and increased to 50 mg. Also changes isosorbide dinitrate mononitrate 15 mg daily. I suspect however, this is not, improve his compliance.   nonischemic cardio myopathy

## 2015-03-06 NOTE — Patient Instructions (Addendum)
Medication Instructions:  Your physician has recommended you make the following change in your medication:  1.  Imdur has been changed to 30 mgs taking 1/2 tablet a day 2.  Hydrazaline has been changed to 50 mg taking 1 tablet twice a day  Labwork: None ordered  Testing/Procedures: None ordered  Follow-Up: Your physician wants you to follow-up in: 5 MONTHS WITH DR. Elease Hashimoto.  You will receive a reminder letter in the mail two months in advance. If you don't receive a letter, please call our office to schedule the follow-up appointment.   Any Other Special Instructions Will Be Listed Below (If Applicable).

## 2015-03-15 ENCOUNTER — Other Ambulatory Visit: Payer: Self-pay | Admitting: Cardiovascular Disease

## 2015-04-01 ENCOUNTER — Other Ambulatory Visit: Payer: Self-pay | Admitting: Internal Medicine

## 2015-04-01 ENCOUNTER — Other Ambulatory Visit: Payer: Self-pay | Admitting: Cardiovascular Disease

## 2015-04-01 NOTE — Telephone Encounter (Signed)
Pt requesting refill

## 2015-04-07 ENCOUNTER — Ambulatory Visit (INDEPENDENT_AMBULATORY_CARE_PROVIDER_SITE_OTHER): Payer: BLUE CROSS/BLUE SHIELD | Admitting: *Deleted

## 2015-04-07 DIAGNOSIS — Z7901 Long term (current) use of anticoagulants: Secondary | ICD-10-CM

## 2015-04-07 DIAGNOSIS — I480 Paroxysmal atrial fibrillation: Secondary | ICD-10-CM | POA: Diagnosis not present

## 2015-04-07 DIAGNOSIS — I4891 Unspecified atrial fibrillation: Secondary | ICD-10-CM

## 2015-04-07 LAB — POCT INR: INR: 2.6

## 2015-04-07 MED ORDER — WARFARIN SODIUM 5 MG PO TABS: ORAL_TABLET | ORAL | Status: DC

## 2015-04-18 ENCOUNTER — Emergency Department (HOSPITAL_COMMUNITY)
Admission: EM | Admit: 2015-04-18 | Discharge: 2015-04-18 | Disposition: A | Discharge status: Home / Self Care | Payer: BLUE CROSS/BLUE SHIELD | Attending: Emergency Medicine | Admitting: Emergency Medicine

## 2015-04-18 ENCOUNTER — Encounter (HOSPITAL_COMMUNITY): Payer: Self-pay | Admitting: Nurse Practitioner

## 2015-04-18 DIAGNOSIS — Z79899 Other long term (current) drug therapy: Secondary | ICD-10-CM | POA: Insufficient documentation

## 2015-04-18 DIAGNOSIS — Z794 Long term (current) use of insulin: Secondary | ICD-10-CM | POA: Diagnosis not present

## 2015-04-18 DIAGNOSIS — Z7901 Long term (current) use of anticoagulants: Secondary | ICD-10-CM | POA: Insufficient documentation

## 2015-04-18 DIAGNOSIS — Z8619 Personal history of other infectious and parasitic diseases: Secondary | ICD-10-CM | POA: Insufficient documentation

## 2015-04-18 DIAGNOSIS — I10 Essential (primary) hypertension: Secondary | ICD-10-CM | POA: Diagnosis not present

## 2015-04-18 DIAGNOSIS — E1165 Type 2 diabetes mellitus with hyperglycemia: Secondary | ICD-10-CM | POA: Diagnosis not present

## 2015-04-18 DIAGNOSIS — Z9889 Other specified postprocedural states: Secondary | ICD-10-CM | POA: Insufficient documentation

## 2015-04-18 DIAGNOSIS — Z8669 Personal history of other diseases of the nervous system and sense organs: Secondary | ICD-10-CM | POA: Diagnosis not present

## 2015-04-18 DIAGNOSIS — I5042 Chronic combined systolic (congestive) and diastolic (congestive) heart failure: Secondary | ICD-10-CM | POA: Diagnosis not present

## 2015-04-18 DIAGNOSIS — R509 Fever, unspecified: Secondary | ICD-10-CM | POA: Diagnosis not present

## 2015-04-18 DIAGNOSIS — L089 Local infection of the skin and subcutaneous tissue, unspecified: Secondary | ICD-10-CM | POA: Diagnosis not present

## 2015-04-18 DIAGNOSIS — N499 Inflammatory disorder of unspecified male genital organ: Secondary | ICD-10-CM | POA: Diagnosis present

## 2015-04-18 DIAGNOSIS — Z87438 Personal history of other diseases of male genital organs: Secondary | ICD-10-CM | POA: Diagnosis not present

## 2015-04-18 LAB — CBC WITH DIFFERENTIAL/PLATELET
Basophils Absolute: 0 10*3/uL (ref 0.0–0.1)
Basophils Relative: 0 %
EOS ABS: 0 10*3/uL (ref 0.0–0.7)
EOS PCT: 0 %
HCT: 35.6 % — ABNORMAL LOW (ref 39.0–52.0)
Hemoglobin: 12.1 g/dL — ABNORMAL LOW (ref 13.0–17.0)
LYMPHS ABS: 1.4 10*3/uL (ref 0.7–4.0)
LYMPHS PCT: 15 %
MCH: 31.2 pg (ref 26.0–34.0)
MCHC: 34 g/dL (ref 30.0–36.0)
MCV: 91.8 fL (ref 78.0–100.0)
MONO ABS: 0.8 10*3/uL (ref 0.1–1.0)
Monocytes Relative: 9 %
Neutro Abs: 6.9 10*3/uL (ref 1.7–7.7)
Neutrophils Relative %: 76 %
PLATELETS: 206 10*3/uL (ref 150–400)
RBC: 3.88 MIL/uL — ABNORMAL LOW (ref 4.22–5.81)
RDW: 12.4 % (ref 11.5–15.5)
WBC: 9.1 10*3/uL (ref 4.0–10.5)

## 2015-04-18 LAB — BASIC METABOLIC PANEL
Anion gap: 7 (ref 5–15)
BUN: 17 mg/dL (ref 6–20)
CALCIUM: 8.4 mg/dL — AB (ref 8.9–10.3)
CO2: 29 mmol/L (ref 22–32)
CREATININE: 1.23 mg/dL (ref 0.61–1.24)
Chloride: 98 mmol/L — ABNORMAL LOW (ref 101–111)
GFR calc Af Amer: >60 mL/min (ref >60)
GLUCOSE: 247 mg/dL — AB (ref 65–99)
Potassium: 3.7 mmol/L (ref 3.5–5.1)
Sodium: 134 mmol/L — ABNORMAL LOW (ref 135–145)

## 2015-04-18 LAB — CBG MONITORING, ED: GLUCOSE-CAPILLARY: 236 mg/dL — AB (ref 65–99)

## 2015-04-18 MED ORDER — OXYCODONE-ACETAMINOPHEN 7.5-325 MG PO TABS: 1.0000 | ORAL_TABLET | Freq: Four times a day (QID) | ORAL | Status: DC | PRN

## 2015-04-18 MED ORDER — DOXYCYCLINE HYCLATE 100 MG PO TABS
100.0000 mg | ORAL_TABLET | Freq: Once | ORAL | Status: AC
  Administered 2015-04-18: 100 mg via ORAL
  Filled 2015-04-18: qty 1

## 2015-04-18 MED ORDER — DOXYCYCLINE HYCLATE 100 MG PO CAPS: 100.0000 mg | ORAL_CAPSULE | Freq: Two times a day (BID) | ORAL | Status: DC

## 2015-04-18 MED ORDER — CEPHALEXIN 250 MG PO CAPS
500.0000 mg | ORAL_CAPSULE | Freq: Once | ORAL | Status: AC
  Administered 2015-04-18: 500 mg via ORAL
  Filled 2015-04-18: qty 2

## 2015-04-18 MED ORDER — OXYCODONE-ACETAMINOPHEN 5-325 MG PO TABS
1.0000 | ORAL_TABLET | Freq: Once | ORAL | Status: AC
  Administered 2015-04-18: 1 via ORAL
  Filled 2015-04-18: qty 1

## 2015-04-18 MED ORDER — CEPHALEXIN 500 MG PO CAPS: 500.0000 mg | ORAL_CAPSULE | Freq: Four times a day (QID) | ORAL | Status: DC

## 2015-04-18 NOTE — ED Provider Notes (Signed)
Patient presented to the ER with pain and swelling of right groin. Symptoms began yesterday. There is no drainage from the area.  Face to face Exam: HEENT - PERRLA Lungs - CTAB Heart - RRR, no M/R/G Abd - S/NT/ND Neuro - alert, oriented x3 Skin - tender erythematous, slightly indurated area right groin and upper thigh area without any fluctuance.  Plan: I did perform ultrasound and there is no drainable abscess. Ultrasound were consistent with edema and swelling of the soft tissues and therefore patient to be treated with antibiotics, return for worsening symptoms to determine if he needs to be drained.  EMERGENCY DEPARTMENT US SOFT TISSUE INTERPRETATION "Study: Limited Ultrasound of the noted body part in comments below"  INDICATIONS: Soft tissue infection Multiple views of the body part are obtained with a multi-frequency linear probe  PERFORMED BY:  Myself  IMAGES ARCHIVED?: Yes  SIDE:Right   BODY PART:Other soft tisse (comment in note) Groin  FINDINGS: No abcess noted and Cellulitis present  INTERPRETATION:  No abcess noted and Cellulitis present      Gilda Crease, MD 04/18/15 [phone]

## 2015-04-18 NOTE — Discharge Instructions (Signed)
By the ultrasound Dr. Blinda Leatherwood did today it does not appear that there is a large amount of pus in the area that is swollen. It appears that there is inflammation and skin infection but not a large pus pocket yet. Take the medication as directed and follow up with your doctor or with the general surgeon. Return here as needed.

## 2015-04-18 NOTE — ED Provider Notes (Signed)
CSN: [id number]     Arrival date & time 04/18/15  1416 History   First MD Initiated Contact with Patient 04/18/15 1441     Chief Complaint  Patient presents with  . Skin Problem     (Consider location/radiation/quality/duration/timing/severity/associated sxs/prior Treatment) Patient is a 44 y.o. male presenting with abscess. The history is provided by the patient.  Abscess Location:  Ano-genital Ano-genital abscess location:  Groin Abscess quality: painful, redness and warmth   Red streaking: no   Duration:  2 days Progression:  Worsening Pain details:    Quality:  Throbbing   Severity:  Moderate   Timing:  Constant   Progression:  Worsening Chronicity:  New Context: diabetes   Relieved by:  Nothing Worsened by:  Nothing tried Ineffective treatments: alcohol and peroxide. Associated symptoms: fever    Johnathan Archer is a 44 y.o. male with hx of diabetes that is controlled with medication and insulin and chronic CHF,AF, and Mitral regurgitation and currently taking coumadin presents to the ED with a painful swollen area to the right groin that started 2 days ago. Today the pain is severe and the swelling is worse.   Past Medical History  Diagnosis Date  . Hypertension   . Type II or unspecified type diabetes mellitus without mention of complication, uncontrolled   . 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  cath 2004 negative for obstruction, last echo 07/2011 EF 35%. b) Normal coronary arteries by cath in 2004 (when EF was noted at 15%). c) Last EF 20% by echo 12/2012  . History of chicken pox   . OSA (obstructive sleep apnea)   . Mitral regurgitation     Moderate by echo 07/2011  . ED (erectile dysfunction)   . Atrial fibrillation with RVR ([place]) 10/2011    lone episode, converted with IV dilt, on coumadin given elevated CHADS2  . Warfarin anticoagulation    Past Surgical History    Procedure Laterality Date  . Hernia repair  2002  . Knee surgery      L knee in HS  . Cardiac catheterization  2004    no blockages  . US echocardiography  07/2011    mod LVH, EF 35%, diffuse hypokinesis of entire myocardium, irreversible restrictive pattern (grade 4 diastolic dysfunction), mod MR, mod dilated LA/RA, decreased RV systolic fxn, no PFO  . US echocardiography  12/2012    severe LVH, EF 20-25%, mod MR, reduced RV systolic function   Family History  Problem Relation Age of Onset  . Diabetes Mother   . Hypertension Father   . Stroke Neg Hx   . Coronary artery disease Neg Hx   . Cancer Neg Hx   . Heart attack Neg Hx    Social History  Substance Use Topics  . Smoking status: Never Smoker   . Smokeless tobacco: Never Used  . Alcohol Use: No     Comment: Occasional 1x/mo    Review of Systems  Constitutional: Positive for fever.  Skin: Positive for wound.       Swollen tender area right inguinal area.  all other systems are negative    Allergies  Review of patient's allergies indicates no known allergies.  Home Medications   Prior to Admission medications   Medication Sig Start Date End Date Taking? Authorizing Provider  carvedilol (COREG) 12.5 MG tablet TAKE ONE TABLET BY MOUTH TWICE DAILY 03/16/15   Wonda Cheng  Nahser, MD  cephALEXin (KEFLEX) 500 MG capsule Take 1 capsule (500 mg total) by mouth 4 (four) times daily. 04/18/15   Lailah Marcelli Bunnie Pion, NP  doxycycline (VIBRAMYCIN) 100 MG capsule Take 1 capsule (100 mg total) by mouth 2 (two) times daily. 04/18/15   Lazar Tierce Bunnie Pion, NP  furosemide (LASIX) 40 MG tablet TAKE ONE TABLET BY MOUTH TWICE DAILY 10/15/14   Thayer Headings, MD  glipiZIDE (GLUCOTROL) 10 MG tablet Take 1 tablet (10 mg total) by mouth 2 (two) times daily before a meal. 03/28/14   Jearld Fenton, NP  hydrALAZINE (APRESOLINE) 50 MG tablet Take 1 tablet (50 mg total) by mouth 2 (two) times daily. 03/06/15   Brett Canales, PA-C  Insulin Glargine (LANTUS) 100  UNIT/ML Solostar Pen Inject 40 Units into the skin daily at 10 pm. 05/14/14   Jearld Fenton, NP  Insulin Pen Needle (PEN NEEDLES) 31G X 6 MM MISC Use as directed 07/07/13   Jonetta Osgood, MD  isosorbide mononitrate (IMDUR) 30 MG 24 hr tablet Take 0.5 tablets (15 mg total) by mouth daily. 03/06/15   Brett Canales, PA-C  LEVEMIR FLEXTOUCH 100 UNIT/ML Pen Inject 100 Units into the skin daily at 10 pm.  05/27/14   Historical Provider, MD  lisinopril (PRINIVIL,ZESTRIL) 20 MG tablet Take 20 mg by mouth daily.    Historical Provider, MD  metFORMIN (GLUCOPHAGE) 500 MG tablet Take 1 tablet (500 mg total) by mouth 2 (two) times daily with a meal. MUST SCHEDULE ANNUAL PHYSICAL 04/01/15   Jearld Fenton, NP  oxyCODONE-acetaminophen (PERCOCET) 7.5-325 MG tablet Take 1 tablet by mouth every 6 (six) hours as needed for severe pain. 04/18/15   Kolbee Bogusz Bunnie Pion, NP  PACERONE 200 MG tablet TAKE ONE TABLET BY MOUTH ONCE DAILY 04/01/15   Thayer Headings, MD  spironolactone (ALDACTONE) 25 MG tablet TAKE ONE TABLET BY MOUTH ONCE DAILY 10/15/14   Thayer Headings, MD  warfarin (COUMADIN) 5 MG tablet Take as directed by coumadin  Clinic  60 tabs is 30 day supply 04/07/15   Thayer Headings, MD   BP 144/83 mmHg  Pulse 92  Temp(Src) 100.3 F (37.9 C) (Oral)  Resp 16  Ht '5\' 11"'  (1.803 m)  Wt 213 lb 12.8 oz (96.979 kg)  BMI 29.83 kg/m2  SpO2 97% Physical Exam  Constitutional: He is oriented to person, place, and time. He appears well-developed and well-nourished.  HENT:  Head: Normocephalic.  Eyes: EOM are normal.  Neck: Neck supple.  Cardiovascular: Normal rate.   Pulmonary/Chest: Effort normal.  Genitourinary:     approximately 6 cm raised firm, tender area with increased warmth to the right inguinal area.   Musculoskeletal: Normal range of motion.  Neurological: He is alert and oriented to person, place, and time. No cranial nerve deficit.  Skin: Skin is warm and dry.  Psychiatric: He has a normal mood and  affect. His behavior is normal.  Nursing note and vitals reviewed.   ED Course  Procedures (including critical care time) Dr. Betsey Holiday in to examine the patient and ultrasound the area. There does not appear to be a pocket of pus that I&D would help at this time. Will treat with antibiotics and pain medication.   Labs Review Results for orders placed or performed during the hospital encounter of 04/18/15 (from the past 48 hour(s))  CBC with Differential/Platelet     Status: Abnormal   Collection Time: 04/18/15  3:21 PM  Result Value  Ref Range   WBC 9.1 4.0 - 10.5 K/uL   RBC 3.88 (L) 4.22 - 5.81 MIL/uL   Hemoglobin 12.1 (L) 13.0 - 17.0 g/dL   HCT 35.6 (L) 39.0 - 52.0 %   MCV 91.8 78.0 - 100.0 fL   MCH 31.2 26.0 - 34.0 pg   MCHC 34.0 30.0 - 36.0 g/dL   RDW 12.4 11.5 - 15.5 %   Platelets 206 150 - 400 K/uL   Neutrophils Relative % 76 %   Neutro Abs 6.9 1.7 - 7.7 K/uL   Lymphocytes Relative 15 %   Lymphs Abs 1.4 0.7 - 4.0 K/uL   Monocytes Relative 9 %   Monocytes Absolute 0.8 0.1 - 1.0 K/uL   Eosinophils Relative 0 %   Eosinophils Absolute 0.0 0.0 - 0.7 K/uL   Basophils Relative 0 %   Basophils Absolute 0.0 0.0 - 0.1 K/uL  Basic metabolic panel     Status: Abnormal   Collection Time: 04/18/15  3:21 PM  Result Value Ref Range   Sodium 134 (L) 135 - 145 mmol/L   Potassium 3.7 3.5 - 5.1 mmol/L   Chloride 98 (L) 101 - 111 mmol/L   CO2 29 22 - 32 mmol/L   Glucose, Bld 247 (H) 65 - 99 mg/dL   BUN 17 6 - 20 mg/dL   Creatinine, Ser 1.23 0.61 - 1.24 mg/dL   Calcium 8.4 (L) 8.9 - 10.3 mg/dL   GFR calc non Af Amer >60 >60 mL/min   GFR calc Af Amer >60 >60 mL/min    Comment: (NOTE) The eGFR has been calculated using the CKD EPI equation. This calculation has not been validated in all clinical situations. eGFR's persistently <60 mL/min signify possible Chronic Kidney Disease.    Anion gap 7 5 - 15     MDM  44 y.o. male with pain and swelling of the right inguinal area x 2 days.  Stable for d/c without elevated WBC and does not appear toxic. Will treat with Keflex, Doxycycline and Percocet. He will follow up with his PCP or the general surgeon or return here as needed for worsening symptoms.  Discussed with the patient and all questioned fully answered.    Final diagnoses:  Skin infection       Ashley Murrain, NP 04/18/15 [place], MD 04/18/15 1739

## 2015-04-18 NOTE — ED Notes (Signed)
Pt c/o painful boil to R groin x 2 days, has been cleaning with alcohol and peroxide, increased swelling since onset. Denies n/v/fevers.

## 2015-05-16 ENCOUNTER — Other Ambulatory Visit: Payer: Self-pay | Admitting: Cardiovascular Disease

## 2015-05-19 ENCOUNTER — Ambulatory Visit (INDEPENDENT_AMBULATORY_CARE_PROVIDER_SITE_OTHER): Payer: BLUE CROSS/BLUE SHIELD | Admitting: Pharmacist

## 2015-05-19 DIAGNOSIS — I480 Paroxysmal atrial fibrillation: Secondary | ICD-10-CM | POA: Diagnosis not present

## 2015-05-19 DIAGNOSIS — I4891 Unspecified atrial fibrillation: Secondary | ICD-10-CM | POA: Diagnosis not present

## 2015-05-19 DIAGNOSIS — Z7901 Long term (current) use of anticoagulants: Secondary | ICD-10-CM

## 2015-05-19 LAB — POCT INR: INR: 1

## 2015-05-19 MED ORDER — WARFARIN SODIUM 5 MG PO TABS: ORAL_TABLET | ORAL | Status: DC

## 2015-06-08 ENCOUNTER — Other Ambulatory Visit: Payer: Self-pay | Admitting: Cardiovascular Disease

## 2015-06-27 ENCOUNTER — Other Ambulatory Visit: Payer: Self-pay | Admitting: Internal Medicine

## 2015-06-29 ENCOUNTER — Ambulatory Visit (INDEPENDENT_AMBULATORY_CARE_PROVIDER_SITE_OTHER): Payer: BLUE CROSS/BLUE SHIELD | Admitting: Pharmacist

## 2015-06-29 DIAGNOSIS — Z7901 Long term (current) use of anticoagulants: Secondary | ICD-10-CM

## 2015-06-29 DIAGNOSIS — I4891 Unspecified atrial fibrillation: Secondary | ICD-10-CM

## 2015-06-29 DIAGNOSIS — I480 Paroxysmal atrial fibrillation: Secondary | ICD-10-CM

## 2015-06-29 LAB — POCT INR: INR: 1.5

## 2015-06-29 MED ORDER — WARFARIN SODIUM 5 MG PO TABS: ORAL_TABLET | ORAL | Status: DC

## 2015-06-29 MED ORDER — SPIRONOLACTONE 25 MG PO TABS: 25.0000 mg | ORAL_TABLET | Freq: Every day | ORAL | Status: DC

## 2015-08-06 ENCOUNTER — Other Ambulatory Visit: Payer: Self-pay | Admitting: Cardiovascular Disease

## 2015-08-10 ENCOUNTER — Ambulatory Visit (INDEPENDENT_AMBULATORY_CARE_PROVIDER_SITE_OTHER): Payer: BLUE CROSS/BLUE SHIELD | Admitting: *Deleted

## 2015-08-10 DIAGNOSIS — Z7901 Long term (current) use of anticoagulants: Secondary | ICD-10-CM | POA: Diagnosis not present

## 2015-08-10 DIAGNOSIS — I4891 Unspecified atrial fibrillation: Secondary | ICD-10-CM

## 2015-08-10 DIAGNOSIS — I480 Paroxysmal atrial fibrillation: Secondary | ICD-10-CM | POA: Diagnosis not present

## 2015-08-10 LAB — POCT INR: INR: 1.2

## 2015-08-25 ENCOUNTER — Telehealth: Payer: Self-pay | Admitting: Cardiovascular Disease

## 2015-08-25 NOTE — Telephone Encounter (Signed)
Returned a call to pt & instructed that he could take OTC Theraflu; after speaking with Aundra Millet, Pharmacist, advised that the daytime Theraflu is safe to take & the night Theraflu has more ingredients that can cause increase in blood pressure-therefore he should limit his use. I communicated this to the pt & he verbalized understanding.  Also, he is overdue for follow-up, made the pt an appt over the phone per pat request & advised that is he has any flu symptoms he should see or call his PCP & reschedule his appt with [facility].  He stated he is sick with cold symptoms & trying to get some quick relief & will try to come to appt today at 3p.

## 2015-08-25 NOTE — Telephone Encounter (Signed)
New message      Pt is on warfarin.  Can he take theraflu?

## 2015-08-28 ENCOUNTER — Ambulatory Visit (INDEPENDENT_AMBULATORY_CARE_PROVIDER_SITE_OTHER): Payer: BLUE CROSS/BLUE SHIELD | Admitting: *Deleted

## 2015-08-28 DIAGNOSIS — Z7901 Long term (current) use of anticoagulants: Secondary | ICD-10-CM | POA: Diagnosis not present

## 2015-08-28 DIAGNOSIS — I480 Paroxysmal atrial fibrillation: Secondary | ICD-10-CM

## 2015-08-28 DIAGNOSIS — I4891 Unspecified atrial fibrillation: Secondary | ICD-10-CM | POA: Diagnosis not present

## 2015-08-28 LAB — POCT INR: INR: 1.3

## 2015-08-28 MED ORDER — WARFARIN SODIUM 5 MG PO TABS: ORAL_TABLET | ORAL | Status: DC

## 2015-09-12 ENCOUNTER — Other Ambulatory Visit: Payer: Self-pay | Admitting: Cardiovascular Disease

## 2015-09-16 ENCOUNTER — Ambulatory Visit (INDEPENDENT_AMBULATORY_CARE_PROVIDER_SITE_OTHER): Payer: BLUE CROSS/BLUE SHIELD | Admitting: *Deleted

## 2015-09-16 DIAGNOSIS — I480 Paroxysmal atrial fibrillation: Secondary | ICD-10-CM

## 2015-09-16 DIAGNOSIS — I4891 Unspecified atrial fibrillation: Secondary | ICD-10-CM | POA: Diagnosis not present

## 2015-09-16 DIAGNOSIS — Z7901 Long term (current) use of anticoagulants: Secondary | ICD-10-CM

## 2015-09-16 LAB — POCT INR: INR: 1.2

## 2015-09-16 MED ORDER — WARFARIN SODIUM 5 MG PO TABS: ORAL_TABLET | ORAL | Status: DC

## 2015-09-17 ENCOUNTER — Telehealth: Payer: Self-pay | Admitting: Pharmacist

## 2015-09-17 NOTE — Telephone Encounter (Signed)
-----   Message from Vesta Mixer, MD sent at 09/17/2015  2:02 PM EDT ----- Regarding: RE: DOAC switch? I would be fine with either one. Xarelto 20 mg a day would be fine    ----- Message -----    From: Awilda Metro, RPH    Sent: 09/16/2015  12:10 PM      To: Vesta Mixer, MD Subject: DOAC switch?                                   Dr. Elease Hashimoto,  We follow Johnathan Archer in the Coumadin clinic. He is very nonadherent with his medications and never shows up to his Coumadin visits due to expensive copays. He has had 1 therapeutic INR reading in the last year and a half. I was hoping we could switch him to Xarelto or Eliquis for his afib - creatinine has been stable. Copay cards should make either of these a cheaper option than Coumadin visit copays.  Thanks, [employer]

## 2015-09-17 NOTE — Telephone Encounter (Signed)
Will discuss switch to Xarelto or Eliquis with pt at his next Coumadin visit.

## 2015-09-27 ENCOUNTER — Other Ambulatory Visit: Payer: Self-pay | Admitting: Internal Medicine

## 2015-10-07 ENCOUNTER — Other Ambulatory Visit: Payer: Self-pay | Admitting: Cardiovascular Disease

## 2015-10-07 ENCOUNTER — Other Ambulatory Visit: Payer: Self-pay | Admitting: Internal Medicine

## 2015-10-08 ENCOUNTER — Ambulatory Visit (INDEPENDENT_AMBULATORY_CARE_PROVIDER_SITE_OTHER): Payer: BLUE CROSS/BLUE SHIELD | Admitting: Pharmacist

## 2015-10-08 DIAGNOSIS — I420 Dilated cardiomyopathy: Secondary | ICD-10-CM

## 2015-10-08 DIAGNOSIS — I480 Paroxysmal atrial fibrillation: Secondary | ICD-10-CM | POA: Diagnosis not present

## 2015-10-08 DIAGNOSIS — Z7901 Long term (current) use of anticoagulants: Secondary | ICD-10-CM

## 2015-10-08 DIAGNOSIS — I4891 Unspecified atrial fibrillation: Secondary | ICD-10-CM | POA: Diagnosis not present

## 2015-10-08 LAB — POCT INR: INR: 1.4

## 2015-10-08 MED ORDER — LISINOPRIL 20 MG PO TABS: 20.0000 mg | ORAL_TABLET | Freq: Every day | ORAL | Status: DC

## 2015-10-08 MED ORDER — RIVAROXABAN 20 MG PO TABS: 20.0000 mg | ORAL_TABLET | Freq: Every day | ORAL | Status: DC

## 2015-10-24 ENCOUNTER — Other Ambulatory Visit: Payer: Self-pay | Admitting: Cardiovascular Disease

## 2015-10-24 ENCOUNTER — Other Ambulatory Visit: Payer: Self-pay | Admitting: Internal Medicine

## 2015-10-26 ENCOUNTER — Other Ambulatory Visit: Payer: Self-pay | Admitting: Internal Medicine

## 2015-10-27 ENCOUNTER — Telehealth: Payer: Self-pay | Admitting: *Deleted

## 2015-10-27 ENCOUNTER — Encounter (HOSPITAL_COMMUNITY): Payer: Self-pay

## 2015-10-27 ENCOUNTER — Emergency Department (HOSPITAL_COMMUNITY)
Admission: EM | Admit: 2015-10-27 | Discharge: 2015-10-27 | Disposition: A | Discharge status: Home / Self Care | Payer: BLUE CROSS/BLUE SHIELD | Attending: Emergency Medicine | Admitting: Emergency Medicine

## 2015-10-27 DIAGNOSIS — R739 Hyperglycemia, unspecified: Secondary | ICD-10-CM

## 2015-10-27 DIAGNOSIS — Z79899 Other long term (current) drug therapy: Secondary | ICD-10-CM | POA: Insufficient documentation

## 2015-10-27 DIAGNOSIS — I10 Essential (primary) hypertension: Secondary | ICD-10-CM | POA: Insufficient documentation

## 2015-10-27 DIAGNOSIS — I5042 Chronic combined systolic (congestive) and diastolic (congestive) heart failure: Secondary | ICD-10-CM | POA: Diagnosis not present

## 2015-10-27 DIAGNOSIS — Z760 Encounter for issue of repeat prescription: Secondary | ICD-10-CM | POA: Diagnosis not present

## 2015-10-27 DIAGNOSIS — Z794 Long term (current) use of insulin: Secondary | ICD-10-CM | POA: Insufficient documentation

## 2015-10-27 DIAGNOSIS — Z8619 Personal history of other infectious and parasitic diseases: Secondary | ICD-10-CM | POA: Diagnosis not present

## 2015-10-27 DIAGNOSIS — Z9889 Other specified postprocedural states: Secondary | ICD-10-CM | POA: Diagnosis not present

## 2015-10-27 DIAGNOSIS — E1165 Type 2 diabetes mellitus with hyperglycemia: Secondary | ICD-10-CM | POA: Diagnosis not present

## 2015-10-27 DIAGNOSIS — Z87438 Personal history of other diseases of male genital organs: Secondary | ICD-10-CM | POA: Diagnosis not present

## 2015-10-27 DIAGNOSIS — Z7984 Long term (current) use of oral hypoglycemic drugs: Secondary | ICD-10-CM | POA: Insufficient documentation

## 2015-10-27 DIAGNOSIS — I4891 Unspecified atrial fibrillation: Secondary | ICD-10-CM | POA: Insufficient documentation

## 2015-10-27 DIAGNOSIS — Z8669 Personal history of other diseases of the nervous system and sense organs: Secondary | ICD-10-CM | POA: Diagnosis not present

## 2015-10-27 DIAGNOSIS — Z7901 Long term (current) use of anticoagulants: Secondary | ICD-10-CM | POA: Insufficient documentation

## 2015-10-27 LAB — BASIC METABOLIC PANEL
Anion gap: 7 (ref 5–15)
BUN: 12 mg/dL (ref 6–20)
CALCIUM: 9.4 mg/dL (ref 8.9–10.3)
CO2: 29 mmol/L (ref 22–32)
CREATININE: 1.19 mg/dL (ref 0.61–1.24)
Chloride: 103 mmol/L (ref 101–111)
GFR calc non Af Amer: >60 mL/min (ref >60)
Glucose, Bld: 357 mg/dL — ABNORMAL HIGH (ref 65–99)
Potassium: 4 mmol/L (ref 3.5–5.1)
Sodium: 139 mmol/L (ref 135–145)

## 2015-10-27 LAB — URINE MICROSCOPIC-ADD ON: Bacteria, UA: NONE SEEN

## 2015-10-27 LAB — URINALYSIS, ROUTINE W REFLEX MICROSCOPIC
BILIRUBIN URINE: NEGATIVE
HGB URINE DIPSTICK: NEGATIVE
KETONES UR: NEGATIVE mg/dL
Leukocytes, UA: NEGATIVE
Nitrite: NEGATIVE
PROTEIN: NEGATIVE mg/dL
Specific Gravity, Urine: 1.033 — ABNORMAL HIGH (ref 1.005–1.030)
pH: 6 (ref 5.0–8.0)

## 2015-10-27 LAB — CBC
HCT: 38.7 % — ABNORMAL LOW (ref 39.0–52.0)
Hemoglobin: 12.7 g/dL — ABNORMAL LOW (ref 13.0–17.0)
MCH: 30.2 pg (ref 26.0–34.0)
MCHC: 32.8 g/dL (ref 30.0–36.0)
MCV: 92.1 fL (ref 78.0–100.0)
PLATELETS: 208 10*3/uL (ref 150–400)
RBC: 4.2 MIL/uL — AB (ref 4.22–5.81)
RDW: 12.6 % (ref 11.5–15.5)
WBC: 4.6 10*3/uL (ref 4.0–10.5)

## 2015-10-27 LAB — CBG MONITORING, ED: Glucose-Capillary: 335 mg/dL — ABNORMAL HIGH (ref 65–99)

## 2015-10-27 MED ORDER — METFORMIN HCL 500 MG PO TABS: 500.0000 mg | ORAL_TABLET | Freq: Two times a day (BID) | ORAL | Status: AC

## 2015-10-27 MED ORDER — METFORMIN HCL 500 MG PO TABS
500.0000 mg | ORAL_TABLET | Freq: Once | ORAL | Status: AC
  Administered 2015-10-27: 500 mg via ORAL
  Filled 2015-10-27: qty 1

## 2015-10-27 MED ORDER — INSULIN GLARGINE 100 UNIT/ML ~~LOC~~ SOLN
25.0000 [IU] | Freq: Once | SUBCUTANEOUS | Status: AC
  Administered 2015-10-27: 25 [IU] via SUBCUTANEOUS
  Filled 2015-10-27: qty 0.25

## 2015-10-27 MED ORDER — INSULIN GLARGINE 100 UNIT/ML ~~LOC~~ SOLN: 25.0000 [IU] | Freq: Every day | SUBCUTANEOUS | Status: DC

## 2015-10-27 NOTE — ED Notes (Signed)
Pt here for meds prescription.

## 2015-10-27 NOTE — Telephone Encounter (Signed)
Pt walked into office requesting to speak with nurse about his medication.  I spoke with pt who is asking for refill of metformin.  This has been followed by Nicki Reaper, NP but pt reports he is no longer seeing her. Has seen Dr Lucianne Muss also but pt reports he is not seeing him anymore. Reports he has been our of metformin for 4 days.  Reports he is also out of insulin.  I told pt we could not refill these for him. I explained to him the need to have his blood sugar checked to make sure he was on appropriate dose.  I instructed pt he should go  across the street to [facility].  Pt agreeable with doing this.  Pt is also past due for follow up with Dr Elease Hashimoto.  I walked him to check out to schedule this appt but he stated he wanted to go over to Urgent Care now and would call back to schedule an appt.

## 2015-10-27 NOTE — Discharge Instructions (Signed)
Hyperglycemia °Hyperglycemia occurs when the glucose (sugar) in your blood is too high. Hyperglycemia can happen for many reasons, but it most often happens to people who do not know they have diabetes or are not managing their diabetes properly.  °CAUSES  °Whether you have diabetes or not, there are other causes of hyperglycemia. Hyperglycemia can occur when you have diabetes, but it can also occur in other situations that you might not be as aware of, such as: °Diabetes °· If you have diabetes and are having problems controlling your blood glucose, hyperglycemia could occur because of some of the following reasons: °¨ Not following your meal plan. °¨ Not taking your diabetes medications or not taking it properly. °¨ Exercising less or doing less activity than you normally do. °¨ Being sick. °Pre-diabetes °· This cannot be ignored. Before people develop Type 2 diabetes, they almost always have "pre-diabetes." This is when your blood glucose levels are higher than normal, but not yet high enough to be diagnosed as diabetes. Research has shown that some long-term damage to the body, especially the heart and circulatory system, may already be occurring during pre-diabetes. If you take action to manage your blood glucose when you have pre-diabetes, you may delay or prevent Type 2 diabetes from developing. °Stress °· If you have diabetes, you may be "diet" controlled or on oral medications or insulin to control your diabetes. However, you may find that your blood glucose is higher than usual in the hospital whether you have diabetes or not. This is often referred to as "stress hyperglycemia." Stress can elevate your blood glucose. This happens because of hormones put out by the body during times of stress. If stress has been the cause of your high blood glucose, it can be followed regularly by your caregiver. That way he/she can make sure your hyperglycemia does not continue to get worse or progress to  diabetes. °Steroids °· Steroids are medications that act on the infection fighting system (immune system) to block inflammation or infection. One side effect can be a rise in blood glucose. Most people can produce enough extra insulin to allow for this rise, but for those who cannot, steroids make blood glucose levels go even higher. It is not unusual for steroid treatments to "uncover" diabetes that is developing. It is not always possible to determine if the hyperglycemia will go away after the steroids are stopped. A special blood test called an A1c is sometimes done to determine if your blood glucose was elevated before the steroids were started. °SYMPTOMS °· Thirsty. °· Frequent urination. °· Dry mouth. °· Blurred vision. °· Tired or fatigue. °· Weakness. °· Sleepy. °· Tingling in feet or leg. °DIAGNOSIS  °Diagnosis is made by monitoring blood glucose in one or all of the following ways: °· A1c test. This is a chemical found in your blood. °· Fingerstick blood glucose monitoring. °· Laboratory results. °TREATMENT  °First, knowing the cause of the hyperglycemia is important before the hyperglycemia can be treated. Treatment may include, but is not be limited to: °· Education. °· Change or adjustment in medications. °· Change or adjustment in meal plan. °· Treatment for an illness, infection, etc. °· More frequent blood glucose monitoring. °· Change in exercise plan. °· Decreasing or stopping steroids. °· Lifestyle changes. °HOME CARE INSTRUCTIONS  °· Test your blood glucose as directed. °· Exercise regularly. Your caregiver will give you instructions about exercise. Pre-diabetes or diabetes which comes on with stress is helped by exercising. °· Eat wholesome,   balanced meals. Eat often and at regular, fixed times. Your caregiver or nutritionist will give you a meal plan to guide your sugar intake. °· Being at an ideal weight is important. If needed, losing as little as 10 to 15 pounds may help improve blood  glucose levels. °SEEK MEDICAL CARE IF:  °· You have questions about medicine, activity, or diet. °· You continue to have symptoms (problems such as increased thirst, urination, or weight gain). °SEEK IMMEDIATE MEDICAL CARE IF:  °· You are vomiting or have diarrhea. °· Your breath smells fruity. °· You are breathing faster or slower. °· You are very sleepy or incoherent. °· You have numbness, tingling, or pain in your feet or hands. °· You have chest pain. °· Your symptoms get worse even though you have been following your caregiver's orders. °· If you have any other questions or concerns. °  °This information is not intended to replace advice given to you by your health care provider. Make sure you discuss any questions you have with your health care provider. °  °Document Released: 11/30/2000 Document Revised: 08/29/2011 Document Reviewed: 02/10/2015 °[employer]vier Interactive Patient Education ©2016 [employer] ° °

## 2015-10-27 NOTE — ED Provider Notes (Signed)
CSN: [id number]     Arrival date & time 10/27/15  1906 History  By signing my name below, I, Ronney Lion, attest that this documentation has been prepared under the direction and in the presence of Cheri Fowler, PA-C. Electronically Signed: Ronney Lion, ED Scribe. 10/27/2015. 9:00 PM.    Chief Complaint  Patient presents with  . Medication Refill  . Hyperglycemia   The history is provided by the patient. No language interpreter was used.    HPI Comments: Johnathan Archer is a 45 y.o. male with a history of hypertension, DM type 2, CHF, and atrial fibrillation, who presents to the Emergency Department for a medication refill of metformin and insulin and a blood sugar check. Patient states he has not been taking his medications and has not checked his blood sugar at home in 4 days. He reports he had tried to call in his medications, but he was turned away, as he does not still see the same PCP who had initially prescribed them. Patient states he normally takes 25 units of Lantus QHS and 500 mg metformin BID, once in the morning and once in the evening. He states he no longer takes glipizide or Levemir. He reports he has not yet taken his hypertension medication today, BP in triage 194/104. He denies CP, SOB, nausea, vomiting, abdominal pain, diarrhea, dizziness, or syncope.   Past Medical History  Diagnosis Date  . Hypertension   . Type II or unspecified type diabetes mellitus without mention of complication, uncontrolled   . 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  cath 2004 negative for obstruction, last echo 07/2011 EF 35%. b) Normal coronary arteries by cath in 2004 (when EF was noted at 15%). c) Last EF 20% by echo 12/2012  . History of chicken pox   . OSA (obstructive sleep apnea)   . Mitral regurgitation     Moderate by echo 07/2011  . ED (erectile dysfunction)   . Atrial fibrillation with RVR (HCC) 10/2011     lone episode, converted with IV dilt, on coumadin given elevated CHADS2  . Warfarin anticoagulation    Past Surgical History  Procedure Laterality Date  . Hernia repair  2002  . Knee surgery      L knee in HS  . Cardiac catheterization  2004    no blockages  . US echocardiography  07/2011    mod LVH, EF 35%, diffuse hypokinesis of entire myocardium, irreversible restrictive pattern (grade 4 diastolic dysfunction), mod MR, mod dilated LA/RA, decreased RV systolic fxn, no PFO  . US echocardiography  12/2012    severe LVH, EF 20-25%, mod MR, reduced RV systolic function   Family History  Problem Relation Age of Onset  . Diabetes Mother   . Hypertension Father   . Stroke Neg Hx   . Coronary artery disease Neg Hx   . Cancer Neg Hx   . Heart attack Neg Hx    Social History  Substance Use Topics  . Smoking status: Never Smoker   . Smokeless tobacco: Never Used  . Alcohol Use: No     Comment: Occasional 1x/mo    Review of Systems  Respiratory: Negative for shortness of breath.   Cardiovascular: Negative for chest pain.  Gastrointestinal: Negative for nausea, vomiting, abdominal pain and diarrhea.  Neurological: Negative for dizziness.  All other systems reviewed and are negative.     Allergies  Review of  patient's allergies indicates no known allergies.  Home Medications   Prior to Admission medications   Medication Sig Start Date End Date Taking? Authorizing Provider  carvedilol (COREG) 12.5 MG tablet TAKE ONE TABLET BY MOUTH TWICE DAILY 03/16/15   Vesta Mixer, MD  furosemide (LASIX) 40 MG tablet TAKE ONE TABLET BY MOUTH TWICE DAILY 06/08/15   Vesta Mixer, MD  glipiZIDE (GLUCOTROL) 10 MG tablet Take 1 tablet (10 mg total) by mouth 2 (two) times daily before a meal. 03/28/14   Lorre Munroe, NP  hydrALAZINE (APRESOLINE) 50 MG tablet Take 1 tablet (50 mg total) by mouth 2 (two) times daily. 03/06/15   Dwana Melena, PA-C  Insulin Glargine (LANTUS) 100 UNIT/ML Solostar  Pen Inject 40 Units into the skin daily at 10 pm. 05/14/14   Lorre Munroe, NP  Insulin Pen Needle (PEN NEEDLES) 31G X 6 MM MISC Use as directed 07/07/13   Maretta Bees, MD  isosorbide mononitrate (IMDUR) 30 MG 24 hr tablet Take 0.5 tablets (15 mg total) by mouth daily. 03/06/15   Dwana Melena, PA-C  LEVEMIR FLEXTOUCH 100 UNIT/ML Pen Inject 100 Units into the skin daily at 10 pm.  05/27/14   Historical Provider, MD  lisinopril (PRINIVIL,ZESTRIL) 20 MG tablet Take 1 tablet (20 mg total) by mouth daily. 10/08/15   Vesta Mixer, MD  metFORMIN (GLUCOPHAGE) 500 MG tablet Take 1 tablet (500 mg total) by mouth 2 (two) times daily with a meal. MUST SCHEDULE ANNUAL PHYSICAL 04/01/15   Lorre Munroe, NP  oxyCODONE-acetaminophen (PERCOCET) 7.5-325 MG tablet Take 1 tablet by mouth every 6 (six) hours as needed for severe pain. 04/18/15   Hope Orlene Och, NP  PACERONE 200 MG tablet TAKE ONE TABLET BY MOUTH ONCE DAILY 04/01/15   Vesta Mixer, MD  rivaroxaban (XARELTO) 20 MG TABS tablet Take 1 tablet (20 mg total) by mouth daily with supper. 10/08/15   Vesta Mixer, MD  spironolactone (ALDACTONE) 25 MG tablet TAKE ONE TABLET BY MOUTH  DAILY 10/26/15   Vesta Mixer, MD   BP 194/104 mmHg  Pulse 68  Temp(Src) 98.4 F (36.9 C) (Oral)  Resp 16  Ht  (1.803 m)  Wt 97.523 kg  BMI 30.00 kg/m2 Physical Exam  Constitutional: He is oriented to person, place, and time. He appears well-developed and well-nourished.  Non-toxic appearance. He does not have a sickly appearance. He does not appear ill. No distress.  HENT:  Head: Normocephalic and atraumatic.  Mouth/Throat: Oropharynx is clear and moist.  Eyes: Conjunctivae and EOM are normal.  Neck: Normal range of motion. Neck supple. No tracheal deviation present.  Cardiovascular: Normal rate, regular rhythm and normal heart sounds.   Pulmonary/Chest: Effort normal and breath sounds normal. No accessory muscle usage or stridor. No respiratory distress.  He has no wheezes. He has no rhonchi. He has no rales.  Abdominal: Soft. Bowel sounds are normal. He exhibits no distension. There is no tenderness. There is no rebound and no guarding.  Musculoskeletal: Normal range of motion.  Lymphadenopathy:    He has no cervical adenopathy.  Neurological: He is alert and oriented to person, place, and time.  Speech clear without dysarthria.  Skin: Skin is warm and dry.  Psychiatric: He has a normal mood and affect. His behavior is normal.  Nursing note and vitals reviewed.   ED Course  Procedures (including critical care time)  COORDINATION OF CARE: 8:56 PM - Discussed treatment plan  with pt at bedside which includes refill of metformin and Lantus. Will administer a dosage of each medication now. Pt verbalized understanding and agreed to plan.   Labs Review Labs Reviewed  BASIC METABOLIC PANEL - Abnormal; Notable for the following:    Glucose, Bld 357 (*)    All other components within normal limits  CBC - Abnormal; Notable for the following:    RBC 4.20 (*)    Hemoglobin 12.7 (*)    HCT 38.7 (*)    All other components within normal limits  URINALYSIS, ROUTINE W REFLEX MICROSCOPIC (NOT AT [facility]) - Abnormal; Notable for the following:    Specific Gravity, Urine 1.033 (*)    Glucose, UA >1000 (*)    All other components within normal limits  URINE MICROSCOPIC-ADD ON - Abnormal; Notable for the following:    Squamous Epithelial / LPF 0-5 (*)    All other components within normal limits  CBG MONITORING, ED - Abnormal; Notable for the following:    Glucose-Capillary 335 (*)    All other components within normal limits  CBG MONITORING, ED   MDM   Final diagnoses:  Hyperglycemia  Medication refill  Essential hypertension   Patient presents for medication refill and hyperglycemia. Patient reports he has been out of his DM medications for the past 4 days.  He reports he takes 25 U Lantus QHS and 500 mg Metformin BID.   Denies CP, SOB,  abdominal pain, N/V/D, dizziness, lightheadness, or syncope.  Otherwise, vitals normal.  Patient appears non-toxic or septic appearing.  Heart RRR, lungs CTAB, abdomen soft and benign.  Labs remarkable for glycosuria, glucose 357; otherwise, unremarkable.  No evidence of DKA,  metabolic acidosis, or metabolic dearangements-- CO2 29, AG 7, K 4.0.  Renal function at baseline.  Patient given 25 U Lantus and 500 mg Metformin.  Lantus and Metformin prescriptions refilled.  He also has not taken is BP medication this evening, BP 194/104.  Asymptomatic HTN.  No evidence of hypertensive urgency/emergency.  Per ACEP guidelines, no indication for intervention at this time.  Discussed return precautions.  Patient agrees and acknowledges the above plan for discharge.  I personally performed the services described in this documentation, which was scribed in my presence. The recorded information has been reviewed and is accurate.     Cheri Fowler, PA-C 10/27/15 2133  Mancel Bale, MD 10/27/15 2325

## 2015-10-27 NOTE — ED Notes (Signed)
Pt reports he is here to have his blood sugar checked because he has not been taking his medications or checking his blood sugar at home in 4 days. He states he is here to get new prescriptions for his diabetic medications. He denies N/V/D. CBG at triage 335

## 2015-11-12 ENCOUNTER — Encounter (HOSPITAL_COMMUNITY): Payer: Self-pay

## 2015-11-12 ENCOUNTER — Ambulatory Visit (HOSPITAL_COMMUNITY)
Admission: EM | Admit: 2015-11-12 | Discharge: 2015-11-12 | Disposition: A | Discharge status: Home / Self Care | Payer: BLUE CROSS/BLUE SHIELD | Attending: Family Medicine | Admitting: Family Medicine

## 2015-11-12 DIAGNOSIS — Z79899 Other long term (current) drug therapy: Secondary | ICD-10-CM | POA: Diagnosis not present

## 2015-11-12 DIAGNOSIS — Z7901 Long term (current) use of anticoagulants: Secondary | ICD-10-CM | POA: Diagnosis not present

## 2015-11-12 DIAGNOSIS — Z833 Family history of diabetes mellitus: Secondary | ICD-10-CM | POA: Diagnosis not present

## 2015-11-12 DIAGNOSIS — L089 Local infection of the skin and subcutaneous tissue, unspecified: Secondary | ICD-10-CM | POA: Diagnosis present

## 2015-11-12 DIAGNOSIS — I4891 Unspecified atrial fibrillation: Secondary | ICD-10-CM | POA: Diagnosis not present

## 2015-11-12 DIAGNOSIS — G4733 Obstructive sleep apnea (adult) (pediatric): Secondary | ICD-10-CM | POA: Insufficient documentation

## 2015-11-12 DIAGNOSIS — Z794 Long term (current) use of insulin: Secondary | ICD-10-CM | POA: Diagnosis not present

## 2015-11-12 DIAGNOSIS — E119 Type 2 diabetes mellitus without complications: Secondary | ICD-10-CM | POA: Insufficient documentation

## 2015-11-12 DIAGNOSIS — L0291 Cutaneous abscess, unspecified: Secondary | ICD-10-CM | POA: Diagnosis not present

## 2015-11-12 DIAGNOSIS — Z8249 Family history of ischemic heart disease and other diseases of the circulatory system: Secondary | ICD-10-CM | POA: Insufficient documentation

## 2015-11-12 DIAGNOSIS — I10 Essential (primary) hypertension: Secondary | ICD-10-CM | POA: Diagnosis not present

## 2015-11-12 DIAGNOSIS — L02811 Cutaneous abscess of head [any part, except face]: Secondary | ICD-10-CM | POA: Insufficient documentation

## 2015-11-12 MED ORDER — CLINDAMYCIN HCL 300 MG PO CAPS: 300.0000 mg | ORAL_CAPSULE | Freq: Four times a day (QID) | ORAL | Status: DC

## 2015-11-12 MED ORDER — LIDOCAINE HCL (PF) 1 % IJ SOLN
INTRAMUSCULAR | Status: AC
  Filled 2015-11-12: qty 5

## 2015-11-12 MED ORDER — HYDROCODONE-ACETAMINOPHEN 5-325 MG PO TABS: 2.0000 | ORAL_TABLET | ORAL | Status: DC | PRN

## 2015-11-12 NOTE — ED Provider Notes (Signed)
CSN: [id number]     Arrival date & time 11/12/15  1553 History   First MD Initiated Contact with Patient 11/12/15 1658     Chief Complaint  Patient presents with  . Recurrent Skin Infections   (Consider location/radiation/quality/duration/timing/severity/associated sxs/prior Treatment) HPI  Past Medical History  Diagnosis Date  . Hypertension   . Type II or unspecified type diabetes mellitus without mention of complication, uncontrolled   . 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  cath 2004 negative for obstruction, last echo 07/2011 EF 35%. b) Normal coronary arteries by cath in 2004 (when EF was noted at 15%). c) Last EF 20% by echo 12/2012  . History of chicken pox   . OSA (obstructive sleep apnea)   . Mitral regurgitation     Moderate by echo 07/2011  . ED (erectile dysfunction)   . Atrial fibrillation with RVR (HCC) 10/2011    lone episode, converted with IV dilt, on coumadin given elevated CHADS2  . Warfarin anticoagulation    Past Surgical History  Procedure Laterality Date  . Hernia repair  2002  . Knee surgery      L knee in HS  . Cardiac catheterization  2004    no blockages  . US echocardiography  07/2011    mod LVH, EF 35%, diffuse hypokinesis of entire myocardium, irreversible restrictive pattern (grade 4 diastolic dysfunction), mod MR, mod dilated LA/RA, decreased RV systolic fxn, no PFO  . US echocardiography  12/2012    severe LVH, EF 20-25%, mod MR, reduced RV systolic function   Family History  Problem Relation Age of Onset  . Diabetes Mother   . Hypertension Father   . Stroke Neg Hx   . Coronary artery disease Neg Hx   . Cancer Neg Hx   . Heart attack Neg Hx    Social History  Substance Use Topics  . Smoking status: Never Smoker   . Smokeless tobacco: Never Used  . Alcohol Use: No     Comment: Occasional 1x/mo    Review of Systems  Allergies  Review of patient's  allergies indicates no known allergies.  Home Medications   Prior to Admission medications   Medication Sig Start Date End Date Taking? Authorizing Provider  carvedilol (COREG) 12.5 MG tablet TAKE ONE TABLET BY MOUTH TWICE DAILY 03/16/15  Yes Vesta Mixer, MD  glipiZIDE (GLUCOTROL) 10 MG tablet Take 1 tablet (10 mg total) by mouth 2 (two) times daily before a meal. 03/28/14  Yes Lorre Munroe, NP  insulin glargine (LANTUS) 100 UNIT/ML injection Inject 0.25 mLs (25 Units total) into the skin at bedtime. 10/27/15  Yes Kayla Rose, PA-C  isosorbide mononitrate (IMDUR) 30 MG 24 hr tablet Take 0.5 tablets (15 mg total) by mouth daily. 03/06/15  Yes Kelle Darting Hager, PA-C  LEVEMIR FLEXTOUCH 100 UNIT/ML Pen Inject 100 Units into the skin daily at 10 pm.  05/27/14  Yes Historical Provider, MD  lisinopril (PRINIVIL,ZESTRIL) 20 MG tablet Take 1 tablet (20 mg total) by mouth daily. 10/08/15  Yes Vesta Mixer, MD  metFORMIN (GLUCOPHAGE) 500 MG tablet Take 1 tablet (500 mg total) by mouth 2 (two) times daily with a meal. 10/27/15  Yes Cheri Fowler, PA-C  oxyCODONE-acetaminophen (PERCOCET) 7.5-325 MG tablet Take 1 tablet by mouth every 6 (six) hours as needed for severe pain. 04/18/15  Yes Hope Orlene Och, NP  rivaroxaban (XARELTO) 20 MG TABS tablet  Take 1 tablet (20 mg total) by mouth daily with supper. 10/08/15  Yes Vesta Mixer, MD  clindamycin (CLEOCIN) 300 MG capsule Take 1 capsule (300 mg total) by mouth every 6 (six) hours. 11/12/15   Tharon Aquas, PA  furosemide (LASIX) 40 MG tablet TAKE ONE TABLET BY MOUTH TWICE DAILY 06/08/15   Vesta Mixer, MD  hydrALAZINE (APRESOLINE) 50 MG tablet Take 1 tablet (50 mg total) by mouth 2 (two) times daily. 03/06/15   Dwana Melena, PA-C  HYDROcodone-acetaminophen (NORCO/VICODIN) 5-325 MG tablet Take 2 tablets by mouth every 4 (four) hours as needed. 11/12/15   Tharon Aquas, PA  Insulin Pen Needle (PEN NEEDLES) 31G X 6 MM MISC Use as directed 07/07/13   Maretta Bees,  MD  PACERONE 200 MG tablet TAKE ONE TABLET BY MOUTH ONCE DAILY 04/01/15   Vesta Mixer, MD  spironolactone (ALDACTONE) 25 MG tablet TAKE ONE TABLET BY MOUTH  DAILY 10/26/15   Vesta Mixer, MD   Meds Ordered and Administered this Visit  Medications - No data to display  BP 152/81 mmHg  Pulse 81  Temp(Src) 98.4 F (36.9 C) (Oral)  Resp 18  SpO2 98% No data found.   Physical Exam  ED Course  .Marland KitchenIncision and Drainage Date/Time: 11/12/2015 5:03 PM Performed by: Tharon Aquas Authorized by: Bradd Canary D Consent: Verbal consent obtained. Risks and benefits: risks, benefits and alternatives were discussed Consent given by: patient Patient identity confirmed: verbally with patient Time out: Immediately prior to procedure a "time out" was called to verify the correct patient, procedure, equipment, support staff and site/side marked as required. Type: abscess Body area: head Location details: scalp Anesthesia: local infiltration Local anesthetic: lidocaine 1% without epinephrine and co-phenylcaine spray Anesthetic total: 3 ml Patient sedated: no Scalpel size: 11 Incision type: single straight Incision depth: subcutaneous Complexity: simple Drainage: purulent Drainage amount: moderate Wound treatment: drain placed Packing material: 1/4 in iodoform gauze Patient tolerance: Patient tolerated the procedure well with no immediate complications Comments: Wound culture obtained.  rx for clindamycin   (including critical care time)  Labs Review Labs Reviewed - No data to display  Imaging Review No results found.   Visual Acuity Review  Right Eye Distance:   Left Eye Distance:   Bilateral Distance:    Right Eye Near:   Left Eye Near:    Bilateral Near:      Patient is return in 2 days for packing removal. Prescription for clindamycin and sent to pharmacy for patient.   MDM   1. Abscess     Patient is reassured that there are no issues that require transfer  to higher level of care at this time or additional tests. Patient is advised to continue home symptomatic treatment. Patient is advised that if there are new or worsening symptoms to attend the emergency department, contact primary care provider, or return to UC. Instructions of care provided discharged home in stable condition.    THIS NOTE WAS GENERATED USING A VOICE RECOGNITION SOFTWARE PROGRAM. ALL REASONABLE EFFORTS  WERE MADE TO PROOFREAD THIS DOCUMENT FOR ACCURACY.  I have verbally reviewed the discharge instructions with the patient. A printed AVS was given to the patient.  All questions were answered prior to discharge.      Tharon Aquas, PA 11/12/15 [phone]

## 2015-11-12 NOTE — ED Notes (Signed)
Pt has boils(2) on the back of his neck Pt has had them for 3 days Pt stated that they are getting worse

## 2015-11-12 NOTE — Discharge Instructions (Signed)
Abscess °An abscess is an infected area that contains a collection of pus and debris. It can occur in almost any part of the body. An abscess is also known as a furuncle or boil. °CAUSES  °An abscess occurs when tissue gets infected. This can occur from blockage of oil or sweat glands, infection of hair follicles, or a minor injury to the skin. As the body tries to fight the infection, pus collects in the area and creates pressure under the skin. This pressure causes pain. People with weakened immune systems have difficulty fighting infections and get certain abscesses more often.  °SYMPTOMS °Usually an abscess develops on the skin and becomes a painful mass that is red, warm, and tender. If the abscess forms under the skin, you may feel a moveable soft area under the skin. Some abscesses break open (rupture) on their own, but most will continue to get worse without care. The infection can spread deeper into the body and eventually into the bloodstream, causing you to feel ill.  °DIAGNOSIS  °Your caregiver will take your medical history and perform a physical exam. A sample of fluid may also be taken from the abscess to determine what is causing your infection. °TREATMENT  °Your caregiver may prescribe antibiotic medicines to fight the infection. However, taking antibiotics alone usually does not cure an abscess. Your caregiver may need to make a small cut (incision) in the abscess to drain the pus. In some cases, gauze is packed into the abscess to reduce pain and to continue draining the area. °HOME CARE INSTRUCTIONS  °· Only take over-the-counter or prescription medicines for pain, discomfort, or fever as directed by your caregiver. °· If you were prescribed antibiotics, take them as directed. Finish them even if you start to feel better. °· If gauze is used, follow your caregiver's directions for changing the gauze. °· To avoid spreading the infection: °· Keep your draining abscess covered with a  bandage. °· Wash your hands well. °· Do not share personal care items, towels, or whirlpools with others. °· Avoid skin contact with others. °· Keep your skin and clothes clean around the abscess. °· Keep all follow-up appointments as directed by your caregiver. °SEEK MEDICAL CARE IF:  °· You have increased pain, swelling, redness, fluid drainage, or bleeding. °· You have muscle aches, chills, or a general ill feeling. °· You have a fever. °MAKE SURE YOU:  °· Understand these instructions. °· Will watch your condition. °· Will get help right away if you are not doing well or get worse. °  °This information is not intended to replace advice given to you by your health care provider. Make sure you discuss any questions you have with your health care provider. °  °Document Released: 03/16/2005 Document Revised: 12/06/2011 Document Reviewed: 08/19/2011 °Elsevier Interactive Patient Education ©2016 [employer] ° °Incision and Drainage °Incision and drainage is a procedure in which a sac-like structure (cystic structure) is opened and drained. The area to be drained usually contains material such as pus, fluid, or blood.  °LET YOUR CAREGIVER KNOW ABOUT:  °· Allergies to medicine. °· Medicines taken, including vitamins, herbs, eyedrops, over-the-counter medicines, and creams. °· Use of steroids (by mouth or creams). °· Previous problems with anesthetics or numbing medicines. °· History of bleeding problems or blood clots. °· Previous surgery. °· Other health problems, including diabetes and kidney problems. °· Possibility of pregnancy, if this applies. °RISKS AND COMPLICATIONS °· Pain. °· Bleeding. °· Scarring. °· Infection. °BEFORE THE PROCEDURE  °  You may need to have an ultrasound or other imaging tests to see how large or deep your cystic structure is. Blood tests may also be used to determine if you have an infection or how severe the infection is. You may need to have a tetanus shot. °PROCEDURE  °The affected area  is cleaned with a cleaning fluid. The cyst area will then be numbed with a medicine (local anesthetic). A small incision will be made in the cystic structure. A syringe or catheter may be used to drain the contents of the cystic structure, or the contents may be squeezed out. The area will then be flushed with a cleansing solution. After cleansing the area, it is often gently packed with a gauze or another wound dressing. Once it is packed, it will be covered with gauze and tape or some other type of wound dressing.  °AFTER THE PROCEDURE  °· Often, you will be allowed to go home right after the procedure. °· You may be given antibiotic medicine to prevent or heal an infection. °· If the area was packed with gauze or some other wound dressing, you will likely need to come back in 1 to 2 days to get it removed. °· The area should heal in about 14 days. °  °This information is not intended to replace advice given to you by your health care provider. Make sure you discuss any questions you have with your health care provider. °  °Document Released: 11/30/2000 Document Revised: 12/06/2011 Document Reviewed: 08/01/2011 °Elsevier Interactive Patient Education ©2016 [employer] Inc[employer] ° °

## 2015-11-15 LAB — AEROBIC CULTURE W GRAM STAIN (SUPERFICIAL SPECIMEN)

## 2015-11-15 LAB — AEROBIC CULTURE  (SUPERFICIAL SPECIMEN)

## 2015-11-16 ENCOUNTER — Telehealth (HOSPITAL_COMMUNITY): Payer: Self-pay | Admitting: Emergency Medicine

## 2015-11-16 NOTE — ED Notes (Signed)
LM on pt's VM [phone] Need to give lab results from recent visit on 5/25 Also let pt know labs can be obtained from MyChart  Per Dr. 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 Sensitive to clindamycin which was prescribed at his [facility] visit. No change in therapy

## 2015-11-18 NOTE — ED Notes (Signed)
Called pt and notified of recent lab results from visit 5/25 Pt ID'd properly... Reports feeling better but still having some drainage  Per Dr. 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 Sensitive to clindamycin which was prescribed at his [facility] visit. No change in therapy  Adv pt if sx are not getting better to return  Gave pt instructions on how to keep wound clean and to f/u discharge instructions given to him and to make sure he washes his hands.  Pt verb understanding

## 2015-12-05 ENCOUNTER — Other Ambulatory Visit: Payer: Self-pay | Admitting: Cardiovascular Disease

## 2015-12-07 ENCOUNTER — Other Ambulatory Visit: Payer: Self-pay | Admitting: *Deleted

## 2015-12-07 MED ORDER — ISOSORBIDE MONONITRATE ER 30 MG PO TB24: 15.0000 mg | ORAL_TABLET | Freq: Every day | ORAL | Status: DC

## 2015-12-07 NOTE — Telephone Encounter (Signed)
Patient should be taking isosorbide mononitrate (Imdur) 15 mg QD rather than isosorbide dinitrate and yes, can refill generic amiodarone

## 2015-12-07 NOTE — Telephone Encounter (Signed)
Sending to you for clarification. Should the patient still be taking imdur 15mg  qd? Also the amiodarone was last ordered as brand pacerone. Ok to refill as generic? Please advise. Thanks, MI

## 2015-12-14 ENCOUNTER — Emergency Department (HOSPITAL_COMMUNITY): Payer: BLUE CROSS/BLUE SHIELD

## 2015-12-14 ENCOUNTER — Other Ambulatory Visit: Payer: Self-pay

## 2015-12-14 ENCOUNTER — Emergency Department (HOSPITAL_COMMUNITY)
Admission: EM | Admit: 2015-12-14 | Discharge: 2015-12-14 | Disposition: A | Discharge status: Home / Self Care | Payer: BLUE CROSS/BLUE SHIELD | Attending: Emergency Medicine | Admitting: Emergency Medicine

## 2015-12-14 ENCOUNTER — Encounter (HOSPITAL_COMMUNITY): Payer: Self-pay | Admitting: Emergency Medicine

## 2015-12-14 ENCOUNTER — Other Ambulatory Visit: Payer: Self-pay | Admitting: Cardiology

## 2015-12-14 DIAGNOSIS — E119 Type 2 diabetes mellitus without complications: Secondary | ICD-10-CM | POA: Diagnosis not present

## 2015-12-14 DIAGNOSIS — Z79899 Other long term (current) drug therapy: Secondary | ICD-10-CM | POA: Insufficient documentation

## 2015-12-14 DIAGNOSIS — Z7901 Long term (current) use of anticoagulants: Secondary | ICD-10-CM | POA: Insufficient documentation

## 2015-12-14 DIAGNOSIS — I5042 Chronic combined systolic (congestive) and diastolic (congestive) heart failure: Secondary | ICD-10-CM | POA: Diagnosis not present

## 2015-12-14 DIAGNOSIS — R002 Palpitations: Secondary | ICD-10-CM | POA: Diagnosis present

## 2015-12-14 DIAGNOSIS — R079 Chest pain, unspecified: Secondary | ICD-10-CM | POA: Insufficient documentation

## 2015-12-14 DIAGNOSIS — I480 Paroxysmal atrial fibrillation: Secondary | ICD-10-CM | POA: Diagnosis not present

## 2015-12-14 DIAGNOSIS — Z7984 Long term (current) use of oral hypoglycemic drugs: Secondary | ICD-10-CM | POA: Insufficient documentation

## 2015-12-14 DIAGNOSIS — Z794 Long term (current) use of insulin: Secondary | ICD-10-CM | POA: Insufficient documentation

## 2015-12-14 DIAGNOSIS — I110 Hypertensive heart disease with heart failure: Secondary | ICD-10-CM | POA: Diagnosis not present

## 2015-12-14 LAB — MAGNESIUM: Magnesium: 1.9 mg/dL (ref 1.7–2.4)

## 2015-12-14 LAB — I-STAT TROPONIN, ED
TROPONIN I, POC: 0.01 ng/mL (ref 0.00–0.08)
TROPONIN I, POC: 0.04 ng/mL (ref 0.00–0.08)

## 2015-12-14 LAB — BASIC METABOLIC PANEL
ANION GAP: 6 (ref 5–15)
BUN: 18 mg/dL (ref 6–20)
CALCIUM: 9.3 mg/dL (ref 8.9–10.3)
CO2: 27 mmol/L (ref 22–32)
CREATININE: 1.17 mg/dL (ref 0.61–1.24)
Chloride: 104 mmol/L (ref 101–111)
GFR calc Af Amer: >60 mL/min (ref >60)
GFR calc non Af Amer: >60 mL/min (ref >60)
GLUCOSE: 323 mg/dL — AB (ref 65–99)
Potassium: 3.9 mmol/L (ref 3.5–5.1)
Sodium: 137 mmol/L (ref 135–145)

## 2015-12-14 LAB — CBC
HCT: 40.5 % (ref 39.0–52.0)
Hemoglobin: 13.5 g/dL (ref 13.0–17.0)
MCH: 29.9 pg (ref 26.0–34.0)
MCHC: 33.3 g/dL (ref 30.0–36.0)
MCV: 89.6 fL (ref 78.0–100.0)
PLATELETS: 174 10*3/uL (ref 150–400)
RBC: 4.52 MIL/uL (ref 4.22–5.81)
RDW: 12.5 % (ref 11.5–15.5)
WBC: 4.7 10*3/uL (ref 4.0–10.5)

## 2015-12-14 LAB — PROTIME-INR
INR: 1.33 (ref 0.00–1.49)
Prothrombin Time: 16.6 seconds — ABNORMAL HIGH (ref 11.6–15.2)

## 2015-12-14 LAB — TROPONIN I: Troponin I: 0.03 ng/mL (ref <0.031)

## 2015-12-14 MED ORDER — SACUBITRIL-VALSARTAN 24-26 MG PO TABS: 1.0000 | ORAL_TABLET | Freq: Two times a day (BID) | ORAL | Status: DC

## 2015-12-14 MED ORDER — AMIODARONE HCL 200 MG PO TABS: ORAL_TABLET | ORAL | Status: DC

## 2015-12-14 MED ORDER — HYDRALAZINE HCL 50 MG PO TABS: 50.0000 mg | ORAL_TABLET | Freq: Two times a day (BID) | ORAL | Status: DC

## 2015-12-14 NOTE — ED Notes (Signed)
P[t. Stated, I started having chest pain and some palpitations.

## 2015-12-14 NOTE — ED Provider Notes (Signed)
CSN: [id number]     Arrival date & time 12/14/15  0846 History   First MD Initiated Contact with Patient 12/14/15 [id number]     Chief Complaint  Patient presents with  . Chest Pain  . Palpitations    Patient is a 45 y.o. male presenting with palpitations. The history is provided by the patient.  Palpitations Palpitations quality:  Irregular Onset quality:  Unable to specify (Felt fine last night, woke up this morning with symptoms) Timing:  Constant Progression:  Unchanged Chronicity:  New Context comment:  Hx of paroxysmal a-fib Relieved by:  None tried Worsened by:  Nothing Ineffective treatments:  None tried Associated symptoms: chest pain   Associated symptoms: no cough, no lower extremity edema, no nausea, no near-syncope, no shortness of breath and no vomiting   Chest pain:    Severity:  Mild   Duration: Since waking up this morning.   Timing:  Constant   Progression:  Unchanged   Chronicity:  New Risk factors: hx of atrial fibrillation   Risk factors comment:  Hx of CHF   Past Medical History  Diagnosis Date  . Hypertension   . Type II or unspecified type diabetes mellitus without mention of complication, uncontrolled   . 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  cath 2004 negative for obstruction, last echo 07/2011 EF 35%. b) Normal coronary arteries by cath in 2004 (when EF was noted at 15%). c) Last EF 20% by echo 12/2012  . History of chicken pox   . OSA (obstructive sleep apnea)   . Mitral regurgitation     Moderate by echo 07/2011  . ED (erectile dysfunction)   . Atrial fibrillation with RVR (HCC) 10/2011    lone episode, converted with IV dilt, on coumadin given elevated CHADS2  . Warfarin anticoagulation    Past Surgical History  Procedure Laterality Date  . Hernia repair  2002  . Knee surgery      L knee in HS  . Cardiac catheterization  2004    no blockages  . US echocardiography   07/2011    mod LVH, EF 35%, diffuse hypokinesis of entire myocardium, irreversible restrictive pattern (grade 4 diastolic dysfunction), mod MR, mod dilated LA/RA, decreased RV systolic fxn, no PFO  . US echocardiography  12/2012    severe LVH, EF 20-25%, mod MR, reduced RV systolic function   Family History  Problem Relation Age of Onset  . Diabetes Mother   . Hypertension Father   . Stroke Neg Hx   . Coronary artery disease Neg Hx   . Cancer Neg Hx   . Heart attack Neg Hx    Social History  Substance Use Topics  . Smoking status: Never Smoker   . Smokeless tobacco: Never Used  . Alcohol Use: No     Comment: Occasional 1x/mo    Review of Systems  Constitutional: Negative for fever and chills.  HENT: Negative for congestion.   Eyes: Negative for redness.  Respiratory: Negative for cough and shortness of breath.   Cardiovascular: Positive for chest pain and palpitations. Negative for near-syncope.  Gastrointestinal: Negative for nausea, vomiting and abdominal pain.  Genitourinary: Negative for flank pain.  Musculoskeletal: Negative for neck stiffness.  Skin: Negative for rash.  Neurological: Negative for speech difficulty.  Psychiatric/Behavioral: Negative for confusion.      Allergies  Review of patient's allergies indicates no known allergies.  Home Medications  Prior to Admission medications   Medication Sig Start Date End Date Taking? Authorizing Provider  amiodarone (PACERONE) 200 MG tablet Take 1 tablet (200 mg total) by mouth daily. Please call and schedule an appointment 12/07/15   Vesta Mixer, MD  carvedilol (COREG) 12.5 MG tablet TAKE ONE TABLET BY MOUTH TWICE DAILY 03/16/15   Vesta Mixer, MD  clindamycin (CLEOCIN) 300 MG capsule Take 1 capsule (300 mg total) by mouth every 6 (six) hours. 11/12/15   Tharon Aquas, PA  furosemide (LASIX) 40 MG tablet TAKE ONE TABLET BY MOUTH TWICE DAILY 06/08/15   Vesta Mixer, MD  glipiZIDE (GLUCOTROL) 10 MG tablet  Take 1 tablet (10 mg total) by mouth 2 (two) times daily before a meal. 03/28/14   Lorre Munroe, NP  hydrALAZINE (APRESOLINE) 50 MG tablet Take 1 tablet (50 mg total) by mouth 2 (two) times daily. 03/06/15   Dwana Melena, PA-C  HYDROcodone-acetaminophen (NORCO/VICODIN) 5-325 MG tablet Take 2 tablets by mouth every 4 (four) hours as needed. 11/12/15   Tharon Aquas, PA  insulin glargine (LANTUS) 100 UNIT/ML injection Inject 0.25 mLs (25 Units total) into the skin at bedtime. 10/27/15   Cheri Fowler, PA-C  Insulin Pen Needle (PEN NEEDLES) 31G X 6 MM MISC Use as directed 07/07/13   Maretta Bees, MD  isosorbide mononitrate (IMDUR) 30 MG 24 hr tablet Take 0.5 tablets (15 mg total) by mouth daily. Please call and schedule an appointment 12/07/15   Vesta Mixer, MD  LEVEMIR FLEXTOUCH 100 UNIT/ML Pen Inject 100 Units into the skin daily at 10 pm.  05/27/14   Historical Provider, MD  lisinopril (PRINIVIL,ZESTRIL) 20 MG tablet Take 1 tablet (20 mg total) by mouth daily. 10/08/15   Vesta Mixer, MD  metFORMIN (GLUCOPHAGE) 500 MG tablet Take 1 tablet (500 mg total) by mouth 2 (two) times daily with a meal. 10/27/15   Cheri Fowler, PA-C  oxyCODONE-acetaminophen (PERCOCET) 7.5-325 MG tablet Take 1 tablet by mouth every 6 (six) hours as needed for severe pain. 04/18/15   Hope Orlene Och, NP  rivaroxaban (XARELTO) 20 MG TABS tablet Take 1 tablet (20 mg total) by mouth daily with supper. 10/08/15   Vesta Mixer, MD  spironolactone (ALDACTONE) 25 MG tablet TAKE ONE TABLET BY MOUTH  DAILY 10/26/15   Vesta Mixer, MD   BP 171/112 mmHg  Pulse 93  Resp 17  SpO2 99% Physical Exam  Constitutional: He is oriented to person, place, and time. He appears well-developed and well-nourished. No distress.  HENT:  Head: Normocephalic and atraumatic.  Right Ear: External ear normal.  Left Ear: External ear normal.  Neck: Normal range of motion.  Cardiovascular: Normal rate.  An irregularly irregular rhythm present.   Pulses:      Radial pulses are 2+ on the right side, and 2+ on the left side.       Dorsalis pedis pulses are 2+ on the right side, and 2+ on the left side.  No peripheral edema  Pulmonary/Chest: Effort normal and breath sounds normal. No respiratory distress. He has no wheezes. He has no rales.  Abdominal: Soft. He exhibits no distension. There is no tenderness.  Neurological: He is alert and oriented to person, place, and time.  Face symmetric, moves all extremities spontaneously, speech clear and appropriate  Skin: Skin is warm and dry. No rash noted. He is not diaphoretic.  Psychiatric: He has a normal mood and affect.  ED Course  Procedures  Labs Review Labs Reviewed  BASIC METABOLIC PANEL - Abnormal; Notable for the following:    Glucose, Bld 323 (*)    All other components within normal limits  PROTIME-INR - Abnormal; Notable for the following:    Prothrombin Time 16.6 (*)    All other components within normal limits  CBC  MAGNESIUM  TROPONIN I  BRAIN NATRIURETIC PEPTIDE  I-STAT TROPOININ, ED  Rosezena Sensor, ED    Imaging Review Dg Chest 2 View  12/14/2015  CLINICAL DATA:  Chest pain, palpitations. EXAM: CHEST  2 VIEW COMPARISON:  05/22/2014 FINDINGS: Heart is borderline in size. Mild vascular congestion. No confluent airspace opacities, effusions or edema. No acute bony abnormality. IMPRESSION: Borderline heart size with vascular congestion. Electronically Signed   By: Charlett Nose M.D.   On: 12/14/2015 09:41   I have personally reviewed and evaluated these images and lab results as part of my medical decision-making.   EKG Interpretation None      MDM   Final diagnoses:  Paroxysmal atrial fibrillation (HCC)    45 y.o. male presents with palpitations. Noted to be in A. fib with a normal rate. Also having chest pain. Remainder of history of present illness, review of systems, exam as above.  CBC, BMP unremarkable. Magnesium within normal limits. Troponin  not elevated.  I discussed his case with cardiology, and they evaluated the patient in the ED. It was determined the patient has not been taking his amiodarone or hydralazine. They recommended restarting amiodarone and hydralazine. They recommended changing lisinopril to Entresto. They will see him in follow-up. His repeat troponin within normal limits.  On reassessment the patient continues to feel well, and is pain-free.  Stable for discharge home. Strict return precautions were discussed and given writing.  Case managed in conjunction with my attending, Dr. Ranae Palms.   Maxine Glenn, MD 12/14/15 1630  Loren Racer, MD 12/18/15 2213

## 2015-12-14 NOTE — Consult Note (Signed)
Cardiology Consult    Patient ID: SADARIUS NORMAN MRN: [id number], DOB/AGE: 46-May-1972   Admit date: 12/14/2015 Date of Consult: 12/14/2015  Primary Physician: No PCP Per Patient Primary Cardiologist: Nahser Requesting Provider: Dr. Melene Muller Reason for Consult: A-Fib  Patient Profile    45 yo male with PMH of HTN/DM II/Systolic and diastolic CHF/OSA/Mitral regurg and PAF (on xarelto) who presented to the [facility] ED with reports of brief episode of chest tightness this morning and palpitations.   Past Medical History   Past Medical History  Diagnosis Date  . Hypertension   . Type II or unspecified type diabetes mellitus without mention of complication, uncontrolled   . 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  cath 2004 negative for obstruction, last echo 07/2011 EF 35%. b) Normal coronary arteries by cath in 2004 (when EF was noted at 15%). c) Last EF 20% by echo 12/2012  . History of chicken pox   . OSA (obstructive sleep apnea)   . Mitral regurgitation     Moderate by echo 07/2011  . ED (erectile dysfunction)   . Atrial fibrillation with RVR (HCC) 10/2011    lone episode, converted with IV dilt, on coumadin given elevated CHADS2  . Warfarin anticoagulation     Past Surgical History  Procedure Laterality Date  . Hernia repair  2002  . Knee surgery      L knee in HS  . Cardiac catheterization  2004    no blockages  . US echocardiography  07/2011    mod LVH, EF 35%, diffuse hypokinesis of entire myocardium, irreversible restrictive pattern (grade 4 diastolic dysfunction), mod MR, mod dilated LA/RA, decreased RV systolic fxn, no PFO  . US echocardiography  12/2012    severe LVH, EF 20-25%, mod MR, reduced RV systolic function     Allergies  No Known Allergies  History of Present Illness    Mr. Ketchum is a 45 yo male patient who has seen Dr. Elease Hashimoto in the past with PMH of HTN/DM  II/Idiopathic Systolic and diastolic CHF/OSA/Mitral regurg and PAF (on xarelto) who was last seen the office by [facility] in 03/06/2015 where he reported some intermittent episodes of chest pain. Also noted to be non- compliant with medications in the past, and running out of home medications. At this visit he was on Coumadin for anticoagulation, but was most recently switched to Xarelto (08/2015) by Aundra Millet PharmD given his decreased compliance with Coumadin dosing. Was in NSR at this appt. Needed a follow up appt with Dr. Elease Hashimoto, but has returned to office to schedule this yet. Last Echo (12/2012) showed an EF of 20-25% with diffuse hypokinesis, and moderate MR.   Reports being in his usual state of health up until this morning. States he was up getting ready, and just "did not feel right". Also reports a brief episode of chest pain that resolved by itself. Also had the feeling of palpitations, "like his heart was out of rhythm". Does report he has been out of his amiodarone for about a week, but has not missed any doses of his Xarelto. Denies any dyspnea, light-headedness, diaphoresis, nausea or LEE. Reports normally being very active in his job, and normally without any episodes of chest pain or dyspnea.   In the ED his labs showed Cr 1.17, Neg POC trop, Hgb 13.5, and chest x-ray with no edema. EKG showed A-fib with rate controlled. He is currently  pain free, does not appear to be volume over-loaded on exam.   Inpatient Medications      Family History    Family History  Problem Relation Age of Onset  . Diabetes Mother   . Hypertension Father   . Stroke Neg Hx   . Coronary artery disease Neg Hx   . Cancer Neg Hx   . Heart attack Neg Hx     Social History    Social History   Social History  . Marital Status: Married    Spouse Name: [name]  . Number of Children: [name]  . Years of Education: [name]   Occupational History  . Not on file.   Social History Main Topics  . Smoking status: Never Smoker    . Smokeless tobacco: Never Used  . Alcohol Use: No     Comment: Occasional 1x/mo  . Drug Use: No  . Sexual Activity: Not on file   Other Topics Concern  . Not on file   Social History Narrative   Caffeine: none   Lives with wife and daughter (8yo), no pets   Occupation: Naval architect   Edu: 58yr college   Activity: works, no regular activity   Diet: water daily, tries fruits/vegetables daily     Review of Systems    General:  No chills, fever, night sweats or weight changes.  Cardiovascular: See HPI Dermatological: No rash, lesions/masses Respiratory: No cough, dyspnea Urologic: No hematuria, dysuria Abdominal:   No nausea, vomiting, diarrhea, bright red blood per rectum, melena, or hematemesis Neurologic:  No visual changes, wkns, changes in mental status. All other systems reviewed and are otherwise negative except as noted above.  Physical Exam    Blood pressure 158/94, pulse 95, resp. rate 21, SpO2 98 %.  General: Pleasant male, NAD Psych: Normal affect. Neuro: Alert and oriented X 3. Moves all extremities spontaneously. HEENT: Normal  Neck: Supple without bruits or JVD. Lungs:  Resp regular and unlabored, CTA. Heart: Irregularly Irregular, no s3, s4, 2/6 systolic murmur. Abdomen: Soft, non-tender, non-distended, BS + x 4.  Extremities: No clubbing, cyanosis or edema. DP/PT/Radials 2+ and equal bilaterally.  Labs    Troponin [facility] of Care Test)  Recent Labs  12/14/15 0916  TROPIPOC 0.01   No results for input(s): CKTOTAL, CKMB, TROPONINI in the last 72 hours. Lab Results  Component Value Date   WBC 4.7 12/14/2015   HGB 13.5 12/14/2015   HCT 40.5 12/14/2015   MCV 89.6 12/14/2015   PLT 174 12/14/2015    Recent Labs Lab 12/14/15 0909  NA 137  K 3.9  CL 104  CO2 27  BUN 18  CREATININE 1.17  CALCIUM 9.3  GLUCOSE 323*   Lab Results  Component Value Date   CHOL 191 06/26/2014   HDL 40.30 06/26/2014   LDLCALC 85 05/12/2014   TRIG 223.0*  06/26/2014   No results found for: [facility]   Radiology Studies    Dg Chest 2 View  12/14/2015  CLINICAL DATA:  Chest pain, palpitations. EXAM: CHEST  2 VIEW COMPARISON:  05/22/2014 FINDINGS: Heart is borderline in size. Mild vascular congestion. No confluent airspace opacities, effusions or edema. No acute bony abnormality. IMPRESSION: Borderline heart size with vascular congestion. Electronically Signed   By: Charlett Nose M.D.   On: 12/14/2015 09:41    ECG & Cardiac Imaging    EKG: A-fib Rate 90  Echo: 12/2012  Study Conclusions  - Left ventricle: The cavity size was mildly dilated. Wall thickness  was increased in a pattern of severe LVH. Systolic function was severely reduced. The estimated ejection fraction was in the range of 20% to 25%. Diffuse hypokinesis. Doppler parameters are consistent with restrictive physiology, indicative of decreased left ventricular diastolic compliance and/or increased left atrial pressure. - Mitral valve: Moderate regurgitation. - Left atrium: The atrium was moderately dilated. - Right ventricle: Systolic function was moderately reduced. - Right atrium: The atrium was mildly dilated. - Pulmonary arteries: Systolic pressure was mildly increased. PA peak pressure: 32mm Hg (S). - Pericardium, extracardiac: A trivial pericardial effusion was identified.  Assessment & Plan    Mr. Aden is a 45 yo male patient who has seen Dr. Elease Hashimoto in the past with PMH of HTN/DM II/Idiopathic Systolic and diastolic CHF/OSA/Mitral regurg and PAF (on xarelto) who was last seen the office by [facility] in 03/06/2015 where he reported some intermittent episodes of chest pain. Also noted to be non- compliant with medications in the past, and running out of home medications. At this visit he was on Coumadin for anticoagulation, but was most recently switched to Xarelto (08/2015) by Aundra Millet PharmD given his decreased compliance with Coumadin dosing. Was in NSR at  this appt. Needed a follow up appt with Dr. Elease Hashimoto, but has returned to office to schedule this yet. Last Echo (12/2012) showed an EF of 20-25% with diffuse hypokinesis, and moderate MR.   Presented to the ED with reports of brief episode of chest pain, and palpitations. In the ED his labs showed Cr 1.17, Neg POC trop, Hgb 13.5, and chest x-ray with no edema. EKG showed A-fib with rate controlled.  1. PAF: Reports feeling palpitations earlier this morning after waking up, with brief episode of chest pain. States he has been out of his Amiodarone for about a week, but has been taking his Xarelto. Currently without chest pain, and rate controlled in the 90s on telemetry. Does not appear volume overloaded. POC trop neg x1, with other labs stable. --Would increase his Amiodarone to 400mg  BID for the next 5 days and then decrease to 200mg  daily --Will arrange for follow up in our office in [facility].  --Noted to be slightly hypertensive, but has not been taking hydralazine (will reorder), would like to change to [facility], but unable to get prior authorization at this time. Will continue Lisinopril for now.  --Check another trop x1  *I have discussed these changes with the patient and informed him of follow up appt.  Janice Coffin, NP-C Pager [phone] 12/14/2015, 11:12 AM

## 2015-12-14 NOTE — Discharge Instructions (Signed)
Take amiodarone 400 mg twice per day for 5 days, then take 200 mg daily. Continue taking Xarelto. Stop taking lisinopril, and start taking Entresto. Follow-up with cardiology.  Atrial Fibrillation Atrial fibrillation is a type of heartbeat that is irregular or fast (rapid). If you have this condition, your heart keeps quivering in a weird (chaotic) way. This condition can make it so your heart cannot pump blood normally. Having this condition gives a person more risk for stroke, heart failure, and other heart problems. There are different types of atrial fibrillation. Talk with your doctor to learn about the type that you have. HOME CARE  Take over-the-counter and prescription medicines only as told by your doctor.  If your doctor prescribed a blood-thinning medicine, take it exactly as told. Taking too much of it can cause bleeding. If you do not take enough of it, you will not have the protection that you need against stroke and other problems.  Do not use any tobacco products. These include cigarettes, chewing tobacco, and e-cigarettes. If you need help quitting, ask your doctor.  If you have apnea (obstructive sleep apnea), manage it as told by your doctor.  Do not drink alcohol.  Do not drink beverages that have caffeine. These include coffee, soda, and tea.  Maintain a healthy weight. Do not use diet pills unless your doctor says they are safe for you. Diet pills may make heart problems worse.  Follow diet instructions as told by your doctor.  Exercise regularly as told by your doctor.  Keep all follow-up visits as told by your doctor. This is important. GET HELP IF:  You notice a change in the speed, rhythm, or strength of your heartbeat.  You are taking a blood-thinning medicine and you notice more bruising.  You get tired more easily when you move or exercise. GET HELP RIGHT AWAY IF:  You have pain in your chest or your belly (abdomen).  You have sweating or  weakness.  You feel sick to your stomach (nauseous).  You notice blood in your throw up (vomit), poop (stool), or pee (urine).  You are short of breath.  You suddenly have swollen feet and ankles.  You feel dizzy.  Your suddenly get weak or numb in your face, arms, or legs, especially if it happens on one side of your body.  You have trouble talking, trouble understanding, or both.  Your face or your eyelid droops on one side. These symptoms may be an emergency. Do not wait to see if the symptoms will go away. Get medical help right away. Call your local emergency services (911 in the [place] Do not drive yourself to the hospital.   This information is not intended to replace advice given to you by your health care provider. Make sure you discuss any questions you have with your health care provider.   Document Released: 03/15/2008 Document Revised: 02/25/2015 Document Reviewed: 10/01/2014 Elsevier Interactive Patient Education [employer]

## 2015-12-14 NOTE — ED Notes (Signed)
Pt verbalized understanding of d/c instructions and has no further questions. Pt stable and NAD.  

## 2015-12-23 NOTE — Progress Notes (Signed)
  ROS   Patient is no show    This encounter was created in error - please disregard. 

## 2015-12-24 ENCOUNTER — Ambulatory Visit: Payer: BLUE CROSS/BLUE SHIELD | Admitting: Physician Assistant

## 2015-12-24 ENCOUNTER — Encounter: Payer: BLUE CROSS/BLUE SHIELD | Admitting: Physician Assistant

## 2015-12-24 DIAGNOSIS — R0989 Other specified symptoms and signs involving the circulatory and respiratory systems: Secondary | ICD-10-CM

## 2015-12-28 ENCOUNTER — Other Ambulatory Visit: Payer: Self-pay | Admitting: Cardiovascular Disease

## 2016-01-06 ENCOUNTER — Encounter: Payer: Self-pay | Admitting: Physician Assistant

## 2016-01-13 ENCOUNTER — Telehealth: Payer: Self-pay | Admitting: Cardiovascular Disease

## 2016-01-13 NOTE — Telephone Encounter (Signed)
New message     The pt missed his appointment with the PA on July 6 th at 10 am the pt was calling back to resch. But nothing was available for today or tomorrow. The pt then asked anything available with Dr. Melburn Popper no until Sept. So the pt got up-set and asked if anyone could see he is off again from work on Tuesday of next week. He wanted to know if there was appointment for today or tomorrow or Tuesday of next week open

## 2016-01-13 NOTE — Telephone Encounter (Signed)
Spoke with patient who requests appointment for tomorrow or Tuesday of next week.  I advised that there are currently no openings for either of these days.  I advised that he may call back tomorrow to see if there have been any cancellations for tomorrow.  If no cancellations tomorrow, I advised him that I can look at 72 hour appointment slots on Friday for next Tuesday.  He verbalized understanding and agreement.

## 2016-02-12 ENCOUNTER — Encounter: Payer: Self-pay | Admitting: Physician Assistant

## 2016-02-28 NOTE — Progress Notes (Signed)
Cardiology Office Note:    Date:  02/29/2016   ID:  Johnathan Archer, DOB Apr 14, 1971, MRN [id number]  PCP:  No PCP Per Patient  Cardiologist:  Dr. Delane Ginger   Electrophysiologist:  n/a  Referring MD: No ref. provider found   Chief Complaint  Patient presents with  . Follow-up    CHF   History of Present Illness:    Johnathan Archer is a 45 y.o. male with a hx of chronic systolic CHF, nonischemic cardiomyopathy, atrial fibrillation, HTN, diabetes, sleep apnea. Patient was previously followed by Dr. Donnie Aho before establishing with Dr. Elease Hashimoto. He has run out of his medication several times in the past. Last seen by Dr. Elease Hashimoto 2/16. Close FU was recommended.  However, he was not seen until 9/16 by Wilburt Finlay, PA-C.   He was seen in the ED in 6/17 with AF with controlled rate.  He was not taking his Amiodarone. Amiodarone was to be resumed.   Returns for FU.  He is here alone today. He has not taken any medications yet today. He has several medications that should be taken bid.  He notes exertional chest pain at times. He has noted this for close to a year now. He notes dyspnea with more moderate activities.  This is chronic.  He thinks it may be getting somewhat worse.  He notes significant dyspnea during sexual intercourse.  He denies syncope.  He denies orthopnea, PND, edema.  He is concerned that his hear is not getting any better.  He is also concerned about his difficulty in attaining an erection.     Prior CV studies that were reviewed today include:    Echo 7/14 Severe LVH, EF 20-25%, diffuse HK, restrictive physiology, moderate MR, moderate LAE, moderately reduced RVSF, mild RAE, PASP 32 mmHg, trivial pericardial effusion  LHC 6/04 Left main coronary artery is normal. Left anterior descending normal. Circumflex coronary artery normal. Right coronary artery normal. IMPRESSION: 1. Severe diffuse hypokinesis with normal coronary arteries compatible with a  cardiomyopathy. 2. Increased left ventricular end-diastolic pressure. 3. No significant coronary artery disease identified.   Past Medical History:  Diagnosis Date  . Atrial fibrillation with RVR (HCC) 10/2011   lone episode, converted with IV dilt, on coumadin given elevated CHADS2  . ED (erectile dysfunction)   . History of chicken pox   . Hypertension   . Mitral regurgitation    Moderate by echo 07/2011  . OSA (obstructive sleep apnea)   . 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  cath 2004 negative for obstruction, last echo 07/2011 EF 35%. b) Normal coronary arteries by cath in 2004 (when EF was noted at 15%). c) Last EF 20% by echo 12/2012  . Type II or unspecified type diabetes mellitus without mention of complication, uncontrolled   . Warfarin anticoagulation     Past Surgical History:  Procedure Laterality Date  . CARDIAC CATHETERIZATION  2004   no blockages  . HERNIA REPAIR  2002  . KNEE SURGERY     L knee in HS  . US ECHOCARDIOGRAPHY  07/2011   mod LVH, EF 35%, diffuse hypokinesis of entire myocardium, irreversible restrictive pattern (grade 4 diastolic dysfunction), mod MR, mod dilated LA/RA, decreased RV systolic fxn, no PFO  . US ECHOCARDIOGRAPHY  12/2012   severe LVH, EF 20-25%, mod MR, reduced RV systolic function    Current Medications: Current Meds  Medication Sig  .  amiodarone (PACERONE) 200 MG tablet Take 1 tablet (200 mg total) by mouth daily.  . carvedilol (COREG) 12.5 MG tablet TAKE ONE TABLET BY MOUTH TWICE DAILY  . furosemide (LASIX) 40 MG tablet Take 40 mg by mouth daily.  Marland Kitchen glipiZIDE (GLUCOTROL) 10 MG tablet Take 1 tablet (10 mg total) by mouth 2 (two) times daily before a meal.  . hydrALAZINE (APRESOLINE) 50 MG tablet Take 1 tablet (50 mg total) by mouth 2 (two) times daily.  Marland Kitchen HYDROcodone-acetaminophen (NORCO/VICODIN) 5-325 MG tablet Take 1 tablet by mouth every 4 (four) hours  as needed (FOR PAIN).  Marland Kitchen isosorbide mononitrate (IMDUR) 30 MG 24 hr tablet Take 0.5 tablets (15 mg total) by mouth daily. Please call and schedule an appointment  . lisinopril (PRINIVIL,ZESTRIL) 20 MG tablet Take 20 mg by mouth daily.  . metFORMIN (GLUCOPHAGE) 500 MG tablet Take 1 tablet (500 mg total) by mouth 2 (two) times daily with a meal.  . rivaroxaban (XARELTO) 20 MG TABS tablet Take 1 tablet (20 mg total) by mouth daily with supper.  . sacubitril-valsartan (ENTRESTO) 24-26 MG Take 1 tablet by mouth 2 (two) times daily.  Marland Kitchen spironolactone (ALDACTONE) 25 MG tablet TAKE ONE TABLET BY MOUTH ONCE DAILY  . Vitamin D, Ergocalciferol, (DRISDOL) 50000 units CAPS capsule Take 50,000 Units by mouth once a week.  . [DISCONTINUED] amiodarone (PACERONE) 200 MG tablet Take 200 mg by mouth. TAKE 2 TABLETS BY MOUTH DAILY FOR 5 DAYS; THE THEN TAKE 1 TABLET B MOUTH DAILY  . [DISCONTINUED] hydrALAZINE (APRESOLINE) 50 MG tablet Take 1 tablet (50 mg total) by mouth 2 (two) times daily.  . [DISCONTINUED] hydrALAZINE (APRESOLINE) 50 MG tablet Take 1 tablet (50 mg total) by mouth 2 (two) times daily.       Allergies:   Review of patient's allergies indicates no known allergies.   Social History   Social History  . Marital status: Married    Spouse name: [name]  . Number of children: [name]  . Years of education: [name]   Social History Main Topics  . Smoking status: Never Smoker  . Smokeless tobacco: Never Used  . Alcohol use No     Comment: Occasional 1x/mo  . Drug use: No  . Sexual activity: Not Asked   Other Topics Concern  . None   Social History Narrative   Caffeine: none   Lives with wife and daughter (8yo), no pets   Occupation: Naval architect   Edu: 51yr college   Activity: works, no regular activity   Diet: water daily, tries fruits/vegetables daily     Family History:  The patient's family history includes Diabetes in his mother; Hypertension in his father.   ROS:   Please see the  history of present illness.    Review of Systems  Cardiovascular: Positive for chest pain, dyspnea on exertion and irregular heartbeat.  Respiratory: Positive for cough.    All other systems reviewed and are negative.   EKGs/Labs/Other Test Reviewed:    EKG:  EKG is  ordered today.  The ekg ordered today demonstrates NSR, HR 83, normal axis, LVH, QTc 512 ms  Recent Labs: 12/14/2015: BUN 18; Creatinine, Ser 1.17; Hemoglobin 13.5; Magnesium 1.9; Platelets 174; Potassium 3.9; Sodium 137   Recent Lipid Panel    Component Value Date/Time   CHOL 191 06/26/2014 1526   TRIG 223.0 (H) 06/26/2014 1526   HDL 40.30 06/26/2014 1526   CHOLHDL 5 06/26/2014 1526   VLDL 44.6 (H) 06/26/2014 1526  LDLCALC 85 05/12/2014 1624   LDLDIRECT 111.5 06/26/2014 1526     Physical Exam:    VS:  BP (!) 178/120   Pulse 83   Ht 5\' 11"  (1.803 m)   Wt 212 lb 12.8 oz (96.5 kg)   BMI 29.68 kg/m     Wt Readings from Last 3 Encounters:  02/29/16 212 lb 12.8 oz (96.5 kg)  10/27/15 215 lb (97.5 kg)  04/18/15 213 lb 12.8 oz (97 kg)     Physical Exam  Constitutional: He is oriented to person, place, and time. He appears well-developed and well-nourished. No distress.  HENT:  Head: Normocephalic and atraumatic.  Eyes: No scleral icterus.  Neck: No JVD present.  Cardiovascular: Normal rate, regular rhythm and normal heart sounds.   No murmur heard. Pulmonary/Chest: Effort normal. He has no wheezes. He has no rales.  Abdominal: Soft. There is no tenderness.  Musculoskeletal: He exhibits edema.  Trace bilateral LE edema  Neurological: He is alert and oriented to person, place, and time.  Skin: Skin is warm and dry.  Psychiatric: He has a normal mood and affect.    ASSESSMENT:    1. Hypertensive heart disease with heart failure (HCC)   2. Chronic combined systolic and diastolic CHF (congestive heart failure) (HCC)   3. Nonischemic cardiomyopathy (HCC)   4. Paroxysmal atrial fibrillation (HCC)   5.  Erectile dysfunction, unspecified erectile dysfunction type   6. Other chest pain    PLAN:    In order of problems listed above:  1. HTN - His BP is markedly elevated.  In review of his chart, his BP typically runs this high. He has had significant issues with non-adherence and typically has run out of some of his medication when he comes in for a visit.  He has run out of his Spironolactone and has not taken it for a while.  He is only taking Coreg and Hydralazine QD (these are to be taken bid).  He admits to watching his salt intake.  However, he does note eating fried chicken nuggets today.  We discussed the importance of taking his medications as prescribed as well as limiting his salt intake.  I would like to get him back on Coreg 12.5 mg bid and Hydralazine 50 mg bid before adjusting his meds further.  We also discussed the dangers of uncontrolled BP, including stroke and heart failure.    -  Increase Coreg to 12.5 mg bid   -  Increase Hydralazine to 50 mg bid   -  FU with me in 1-2 weeks.  Plan resuming Spironolactone at that time.  -  Labs today: BMET  -  Consider increasing Hydralazine to TID and increasing Lisinopril to 40 mg QD at some point.  2. Chronic combined systolic and diastolic CHF - He is concerned that he is not getting better and asks what he can do to get his hear better.  I have urged him to pursue good BP control as outlined above.  Currently, he is NYHA 2b.  His volume status appears stable.  Continue beta-blocker, nitrates, hydralazine, ACE inhibitor.  Plan on adding Spironolactone at next visit.    3. NICM - EF 20-25% at last Echo in 2014.  We discussed the importance of good BP control in managing this. Would rec repeating his Echo once his BP is better controlled.    4. PAF - Maintaining NSR today. He is back on Amiodarone.  He also admits adherence to Xarelto.  CHADS2-VASc=3.    -  Labs today: BMET, CBC, LFTs, TSH.  5. Erectile Dysfunction - This is a major concern  for him today. I did explain to him that his BP is likely contributing to his problem.  His diabetes is also likely playing a role.  We also discussed the possibility of BP medications making this worse.  I advised him to not be concerned with this until we can get his BP controlled.  At that point, if his symptoms continue, he could come off of nitrates and try PDE-5 inhibitors or get referred to Urology.    6. Chest pain - He has chest pain with some typical features.  He does have risk factors including uncontrolled HTN as well as diabetes.  His cath in 2004 demonstrated no significant CAD.  I suspect his uncontrolled BP is contributing to his symptoms.  I recommended trying to get his BP better controlled.   Given his risk factor, if his symptoms continue with optimal or close to optimal BP, I would then proceed with stress testing to evaluate for ischemia.      Medication Adjustments/Labs and Tests Ordered: Current medicines are reviewed at length with the patient today.  Concerns regarding medicines are outlined above.  Medication changes, Labs and Tests ordered today are outlined in the Patient Instructions noted below. Patient Instructions  Medication Instructions:  1. INCREASE COREG TO 12.5 MG TWICE DAILY 2. INCREASE HYDRALAZINE TO 50 MG TWICE DAILY 3. A REFILL FOR AMIODARONE WAS SENT IN  Labwork: 1. TODAY BMET, CBC W/DIFF, TSH, LFT Testing/Procedures: NONE Follow-Up: 03/14/16 @ 3:158 WITH Nalin Mazzocco, PAC  Any Other Special Instructions Will Be Listed Below (If Applicable). If you need a refill on your cardiac medications before your next appointment, please call your pharmacy.  Signed, Tereso Newcomer, PA-C  02/29/2016 4:17 PM    [facility] [address] [place], [place], [place]  [address] Phone: [phone]; Fax: [phone]

## 2016-02-29 ENCOUNTER — Other Ambulatory Visit: Payer: Self-pay | Admitting: Physician Assistant

## 2016-02-29 ENCOUNTER — Encounter (INDEPENDENT_AMBULATORY_CARE_PROVIDER_SITE_OTHER): Payer: Self-pay

## 2016-02-29 ENCOUNTER — Ambulatory Visit (INDEPENDENT_AMBULATORY_CARE_PROVIDER_SITE_OTHER): Payer: BLUE CROSS/BLUE SHIELD | Admitting: Physician Assistant

## 2016-02-29 ENCOUNTER — Encounter: Payer: Self-pay | Admitting: Physician Assistant

## 2016-02-29 VITALS — BP 178/120 | HR 83 | Ht 71.0 in | Wt 212.8 lb

## 2016-02-29 DIAGNOSIS — I429 Cardiomyopathy, unspecified: Secondary | ICD-10-CM

## 2016-02-29 DIAGNOSIS — I5042 Chronic combined systolic (congestive) and diastolic (congestive) heart failure: Secondary | ICD-10-CM | POA: Diagnosis not present

## 2016-02-29 DIAGNOSIS — I480 Paroxysmal atrial fibrillation: Secondary | ICD-10-CM | POA: Diagnosis not present

## 2016-02-29 DIAGNOSIS — I428 Other cardiomyopathies: Secondary | ICD-10-CM

## 2016-02-29 DIAGNOSIS — I110 Hypertensive heart disease with heart failure: Secondary | ICD-10-CM

## 2016-02-29 DIAGNOSIS — R0789 Other chest pain: Secondary | ICD-10-CM

## 2016-02-29 DIAGNOSIS — N529 Male erectile dysfunction, unspecified: Secondary | ICD-10-CM

## 2016-02-29 LAB — CBC WITH DIFFERENTIAL/PLATELET
BASOS PCT: 0 %
Basophils Absolute: 0 cells/uL (ref 0–200)
EOS PCT: 3 %
Eosinophils Absolute: 153 cells/uL (ref 15–500)
HCT: 33.8 % — ABNORMAL LOW (ref 38.5–50.0)
Hemoglobin: 11 g/dL — ABNORMAL LOW (ref 13.2–17.1)
LYMPHS PCT: 28 %
Lymphs Abs: 1428 cells/uL (ref 850–3900)
MCH: 28.9 pg (ref 27.0–33.0)
MCHC: 32.5 g/dL (ref 32.0–36.0)
MCV: 88.9 fL (ref 80.0–100.0)
MONO ABS: 561 {cells}/uL (ref 200–950)
MONOS PCT: 11 %
MPV: 11.4 fL (ref 7.5–12.5)
NEUTROS ABS: 2958 {cells}/uL (ref 1500–7800)
Neutrophils Relative %: 58 %
PLATELETS: 270 10*3/uL (ref 140–400)
RBC: 3.8 MIL/uL — ABNORMAL LOW (ref 4.20–5.80)
RDW: 12.7 % (ref 11.0–15.0)
WBC: 5.1 10*3/uL (ref 3.8–10.8)

## 2016-02-29 LAB — BASIC METABOLIC PANEL
BUN: 15 mg/dL (ref 7–25)
CO2: 26 mmol/L (ref 20–31)
CREATININE: 0.98 mg/dL (ref 0.60–1.35)
Calcium: 8.9 mg/dL (ref 8.6–10.3)
Chloride: 107 mmol/L (ref 98–110)
Glucose, Bld: 139 mg/dL — ABNORMAL HIGH (ref 65–99)
POTASSIUM: 4 mmol/L (ref 3.5–5.3)
Sodium: 141 mmol/L (ref 135–146)

## 2016-02-29 MED ORDER — HYDRALAZINE HCL 50 MG PO TABS: 50.0000 mg | ORAL_TABLET | Freq: Two times a day (BID) | ORAL | 3 refills | Status: DC

## 2016-02-29 MED ORDER — AMIODARONE HCL 200 MG PO TABS: 200.0000 mg | ORAL_TABLET | Freq: Every day | ORAL | 3 refills | Status: DC

## 2016-02-29 NOTE — Patient Instructions (Addendum)
Medication Instructions:  1. INCREASE COREG TO 12.5 MG TWICE DAILY 2. INCREASE HYDRALAZINE TO 50 MG TWICE DAILY 3. A REFILL FOR AMIODARONE WAS SENT IN  Labwork: 1. TODAY BMET, CBC W/DIFF, TSH, LFT Testing/Procedures: NONE Follow-Up: 03/14/16 @ 3:158 WITH SCOTT WEAVER, PAC  Any Other Special Instructions Will Be Listed Below (If Applicable). If you need a refill on your cardiac medications before your next appointment, please call your pharmacy.

## 2016-03-01 LAB — HEPATITIS PANEL, ACUTE
HCV AB: NEGATIVE
HEP B C IGM: NONREACTIVE
Hep A IgM: NONREACTIVE
Hepatitis B Surface Ag: NEGATIVE

## 2016-03-01 LAB — TSH

## 2016-03-04 ENCOUNTER — Telehealth: Payer: Self-pay | Admitting: *Deleted

## 2016-03-04 DIAGNOSIS — I10 Essential (primary) hypertension: Secondary | ICD-10-CM

## 2016-03-04 DIAGNOSIS — I5042 Chronic combined systolic (congestive) and diastolic (congestive) heart failure: Secondary | ICD-10-CM

## 2016-03-04 DIAGNOSIS — I480 Paroxysmal atrial fibrillation: Secondary | ICD-10-CM

## 2016-03-04 NOTE — Telephone Encounter (Signed)
S/w pt who has been notified of lab results and findings by phone with verbal understanding. Pt is agreeable to repeat lab work, states he will come in on Monday 9/18 after 3 pm; TSH, Free T4, Free T3 , CBC and will do LFT since these were not drawn at [facility] as ordered. Pt verbalized understanding to plan of care.

## 2016-03-04 NOTE — Telephone Encounter (Signed)
Follow up  Pt voiced he is returning nurses call.  Please f/u with pt

## 2016-03-04 NOTE — Telephone Encounter (Signed)
DPR for wife. LMTCB on wife's phone today. Have not been able to reach pt after multiple attempts to go over lab results and recommendations.

## 2016-03-07 ENCOUNTER — Other Ambulatory Visit: Payer: BLUE CROSS/BLUE SHIELD

## 2016-03-11 ENCOUNTER — Telehealth: Payer: Self-pay | Admitting: *Deleted

## 2016-03-11 NOTE — Telephone Encounter (Signed)
I called pt in regards to he missed his lab appt 9/18 for a bmet. Pt has appt 9/25 with Bing Neighbors. PA. I did lmom to see if pt could possibly come in today for bmet so that we may have the results for his appt on 9/25.

## 2016-03-13 NOTE — Progress Notes (Signed)
Cardiology Office Note:    Date:  03/14/2016   ID:  Johnathan Archer, DOB Mar 13, 1971, MRN [id number]  PCP:  No PCP Per Patient  Cardiologist:  Dr. Delane Ginger   Electrophysiologist:  n/a  Referring MD: No ref. provider found   Chief Complaint  Patient presents with  . Follow-up    CHF   History of Present Illness:    Johnathan Archer is a 45 y.o. male with a hx of chronic systolic CHF, nonischemic cardiomyopathy, atrial fibrillation, HTN, diabetes, sleep apnea. Patient was previously followed by Dr. Donnie Aho before establishing with Dr. Elease Hashimoto. He has run out of his medication several times in the past. Last seen by Dr. Elease Hashimoto 2/16. He was lost to FU.  He was then seen in the ED in 6/17 with AF with controlled rate.  He was not taking his Amiodarone and this was resumed.   I saw him 02/29/16. His blood pressure was markedly elevated. He was not taking his medications correctly. He was apparently given Entresto in the emergency room months ago. He was not taking this due to cost. I adjusted his medications and brought her back for follow-up today. Of note, labs were obtained at last visit and his TSH was low. His hemoglobin was also lower than previous. He returns for follow-up today. He continues to note dyspnea with more moderate activities. He has occasional chest pain. This has been chronic and stable for quite some time. He denies syncope. He denies orthopnea. He sometimes awakens in the night short of breath. He denies LE edema. Of note, he's recently had a cough that is nonproductive. Denies fevers. He does have sleep apnea and uses CPAP.  Prior CV studies that were reviewed today include:    Echo 7/14 Severe LVH, EF 20-25%, diffuse HK, restrictive physiology, moderate MR, moderate LAE, moderately reduced RVSF, mild RAE, PASP 32 mmHg, trivial pericardial effusion  LHC 6/04 Left main coronary artery is normal. Left anterior descending normal. Circumflex coronary artery  normal. Right coronary artery normal. IMPRESSION: 1. Severe diffuse hypokinesis with normal coronary arteries compatible witha cardiomyopathy. 2. Increased left ventricular end-diastolic pressure. 3. No significant coronary artery disease identified.  Past Medical History:  Diagnosis Date  . Atrial fibrillation with RVR (HCC) 10/2011   lone episode, converted with IV dilt, on coumadin given elevated CHADS2  . ED (erectile dysfunction)   . History of chicken pox   . Hypertension   . Mitral regurgitation    Moderate by echo 07/2011  . OSA (obstructive sleep apnea)   . 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  cath 2004 negative for obstruction, last echo 07/2011 EF 35%. b) Normal coronary arteries by cath in 2004 (when EF was noted at 15%). c) Last EF 20% by echo 12/2012  . Type II or unspecified type diabetes mellitus without mention of complication, uncontrolled   . Warfarin anticoagulation     Past Surgical History:  Procedure Laterality Date  . CARDIAC CATHETERIZATION  2004   no blockages  . HERNIA REPAIR  2002  . KNEE SURGERY     L knee in HS  . US ECHOCARDIOGRAPHY  07/2011   mod LVH, EF 35%, diffuse hypokinesis of entire myocardium, irreversible restrictive pattern (grade 4 diastolic dysfunction), mod MR, mod dilated LA/RA, decreased RV systolic fxn, no PFO  . US ECHOCARDIOGRAPHY  12/2012   severe LVH, EF 20-25%, mod MR, reduced RV systolic  function    Current Medications: Outpatient Medications Prior to Visit  Medication Sig Dispense Refill  . amiodarone (PACERONE) 200 MG tablet Take 1 tablet (200 mg total) by mouth daily. 90 tablet 3  . furosemide (LASIX) 40 MG tablet Take 40 mg by mouth daily.    Marland Kitchen glipiZIDE (GLUCOTROL) 10 MG tablet Take 1 tablet (10 mg total) by mouth 2 (two) times daily before a meal. 60 tablet 5  . hydrALAZINE (APRESOLINE) 50 MG tablet Take 1 tablet (50 mg total) by mouth 2  (two) times daily. 180 tablet 3  . HYDROcodone-acetaminophen (NORCO/VICODIN) 5-325 MG tablet Take 1 tablet by mouth every 4 (four) hours as needed (FOR PAIN).    Marland Kitchen isosorbide mononitrate (IMDUR) 30 MG 24 hr tablet Take 0.5 tablets (15 mg total) by mouth daily. Please call and schedule an appointment 45 tablet 0  . metFORMIN (GLUCOPHAGE) 500 MG tablet Take 1 tablet (500 mg total) by mouth 2 (two) times daily with a meal. 60 tablet 0  . rivaroxaban (XARELTO) 20 MG TABS tablet Take 1 tablet (20 mg total) by mouth daily with supper. 30 tablet 11  . carvedilol (COREG) 12.5 MG tablet TAKE ONE TABLET BY MOUTH TWICE DAILY 180 tablet 3  . lisinopril (PRINIVIL,ZESTRIL) 20 MG tablet Take 20 mg by mouth daily.  1  . sacubitril-valsartan (ENTRESTO) 24-26 MG Take 1 tablet by mouth 2 (two) times daily. (Patient not taking: Reported on 03/14/2016) 60 tablet 0  . spironolactone (ALDACTONE) 25 MG tablet TAKE ONE TABLET BY MOUTH ONCE DAILY (Patient not taking: Reported on 03/14/2016) 30 tablet 1  . Vitamin D, Ergocalciferol, (DRISDOL) 50000 units CAPS capsule Take 50,000 Units by mouth once a week.  0   No facility-administered medications prior to visit.       Allergies:   Review of patient's allergies indicates no known allergies.   Social History   Social History  . Marital status: Married    Spouse name: [name]  . Number of children: [name]  . Years of education: [name]   Social History Main Topics  . Smoking status: Never Smoker  . Smokeless tobacco: Never Used  . Alcohol use No     Comment: Occasional 1x/mo  . Drug use: No  . Sexual activity: Not Asked   Other Topics Concern  . None   Social History Narrative   Caffeine: none   Lives with wife and daughter (8yo), no pets   Occupation: Naval architect   Edu: 88yr college   Activity: works, no regular activity   Diet: water daily, tries fruits/vegetables daily     Family History:  The patient's family history includes Diabetes in his mother;  Hypertension in his father.   ROS:   Please see the history of present illness.    ROS All other systems reviewed and are negative.   EKGs/Labs/Other Test Reviewed:    EKG:  EKG is  ordered today.  The ekg ordered today demonstrates NSR, HR 92, normal axis, IVCD, QTc 509 ms, LVH, no change since prior tracing  Recent Labs: 12/14/2015: Magnesium 1.9 02/29/2016: BUN 15; Creat 0.98; Hemoglobin 11.0; Platelets 270; Potassium 4.0; Sodium 141; TSH <0.01   Recent Lipid Panel    Component Value Date/Time   CHOL 191 06/26/2014 1526   TRIG 223.0 (H) 06/26/2014 1526   HDL 40.30 06/26/2014 1526   CHOLHDL 5 06/26/2014 1526   VLDL 44.6 (H) 06/26/2014 1526   LDLCALC 85 05/12/2014 1624   LDLDIRECT 111.5 06/26/2014 1526  Physical Exam:    VS:  BP (!) 180/110   Pulse 92   Ht 5\' 11"  (1.803 m)   Wt 217 lb 6.4 oz (98.6 kg)   BMI 30.32 kg/m     Wt Readings from Last 3 Encounters:  03/14/16 217 lb 6.4 oz (98.6 kg)  02/29/16 212 lb 12.8 oz (96.5 kg)  10/27/15 215 lb (97.5 kg)     Physical Exam  Constitutional: He is oriented to person, place, and time. He appears well-developed and well-nourished. No distress.  HENT:  Head: Normocephalic and atraumatic.  Eyes: No scleral icterus.  Neck: No JVD present.  Cardiovascular: Normal rate, regular rhythm and normal heart sounds.   No murmur heard. Pulmonary/Chest: Effort normal. He has no wheezes. He has no rales.  Abdominal: Soft. There is no tenderness.  Musculoskeletal: He exhibits no edema.  Neurological: He is alert and oriented to person, place, and time.  Skin: Skin is warm and dry.  Psychiatric: He has a normal mood and affect.    ASSESSMENT:    1. Hypertensive heart disease with heart failure (HCC)   2. Chronic combined systolic and diastolic CHF (congestive heart failure) (HCC)   3. Paroxysmal atrial fibrillation (HCC)   4. Nonischemic cardiomyopathy (HCC)   5. Hyperthyroidism   6. Sleep apnea    PLAN:    In order of  problems listed above:  1. HTN - His blood pressure remains markedly elevated. He notes adherence with his medications. I will continue to try to titrate his medications for better blood pressure management. We discussed the importance of a low salt diet.  -  Increase carvedilol to 25 mg twice a day  -  Increase lisinopril to 20 mg twice a day  -  Continue Lasix, hydralazine, isosorbide  -  Plan increasing hydralazine and resuming spironolactone at next visit  2. Chronic combined systolic and diastolic CHF - HTN is likely the cause for his DCM.  His volume appears stable.  He does not appear volume overloaded on exam.  However, he notes a cough the last several days.  I will obtain a BMET, BNP and CXR.  3. NICM - EF 20-25% at last Echo in 2014.  I will obtain a FU Echo once his BP is better controlled.    4. PAF - Maintaining NSR today. He remains on Amiodarone and Xarelto.  CHADS2-VASc=3.  He has episodes of palpitations on occasion.  I will increase his beta blocker as noted above. He had a low TSH when last seen and his LFTs were not drawn.             -  Obtain LFTs, TSH, Free T3, Free T4  -  If TSH still low, refer to Endocrinology  5. Chest pain - He has had chest pain for quite some time.  His cath in 2004 demonstrated no significant CAD.  I suspect his uncontrolled BP is contributing to his symptoms.  Given his risk factor, if his symptoms continue with optimal or close to optimal BP, I would then proceed with nuclear stress testing to evaluate for ischemia.     6. Cough - Probably viral URI.  Will get BNP, CBC with diff, CXR.    7. OSA - No FU in years.  He uses CPAP.  With his HTN and PAF, will refer to Dr. Mayford Knife for management of OSA.   Medication Adjustments/Labs and Tests Ordered: Current medicines are reviewed at length with the patient today.  Concerns  regarding medicines are outlined above.  Medication changes, Labs and Tests ordered today are outlined in the Patient  Instructions noted below. Patient Instructions  Medication Instructions:  1. INCREASE COREG TO 25 MG TWICE DAILY 2. INCREASE LISINOPRIL TO 20 MG TWICE DAILY  Labwork: 1. TODAY BMET, CBC /WDIFF, TSH, LFT, BNP, FREE T4, FREE T3,  2. BMET TO BE DONE IN 1 WEEK  Testing/Procedures: [facility] @ [address]   Follow-Up: 03/28/16 @ 3:45 WITH Lilley Hubble, PAC   Any Other Special Instructions Will Be Listed Below (If Applicable).  If you need a refill on your cardiac medications before your next appointment, please call your pharmacy.  Signed, Tereso NewcomerScott Sierra Bissonette, PA-C  03/14/2016 5:14 PM    [facility] [address], [place], [place]  [address] Phone: [phone]; Fax: [phone]

## 2016-03-14 ENCOUNTER — Telehealth: Payer: Self-pay | Admitting: *Deleted

## 2016-03-14 ENCOUNTER — Encounter: Payer: Self-pay | Admitting: Physician Assistant

## 2016-03-14 ENCOUNTER — Ambulatory Visit (INDEPENDENT_AMBULATORY_CARE_PROVIDER_SITE_OTHER): Payer: BLUE CROSS/BLUE SHIELD | Admitting: Physician Assistant

## 2016-03-14 VITALS — BP 180/110 | HR 92 | Ht 71.0 in | Wt 217.4 lb

## 2016-03-14 DIAGNOSIS — E0590 Thyrotoxicosis, unspecified without thyrotoxic crisis or storm: Secondary | ICD-10-CM

## 2016-03-14 DIAGNOSIS — I5042 Chronic combined systolic (congestive) and diastolic (congestive) heart failure: Secondary | ICD-10-CM

## 2016-03-14 DIAGNOSIS — I110 Hypertensive heart disease with heart failure: Secondary | ICD-10-CM

## 2016-03-14 DIAGNOSIS — I428 Other cardiomyopathies: Secondary | ICD-10-CM

## 2016-03-14 DIAGNOSIS — I480 Paroxysmal atrial fibrillation: Secondary | ICD-10-CM | POA: Diagnosis not present

## 2016-03-14 DIAGNOSIS — G4730 Sleep apnea, unspecified: Secondary | ICD-10-CM

## 2016-03-14 DIAGNOSIS — I429 Cardiomyopathy, unspecified: Secondary | ICD-10-CM | POA: Diagnosis not present

## 2016-03-14 MED ORDER — CARVEDILOL 25 MG PO TABS: 25.0000 mg | ORAL_TABLET | Freq: Two times a day (BID) | ORAL | 3 refills | Status: DC

## 2016-03-14 MED ORDER — LISINOPRIL 20 MG PO TABS: 20.0000 mg | ORAL_TABLET | Freq: Two times a day (BID) | ORAL | 3 refills | Status: DC

## 2016-03-14 NOTE — Patient Instructions (Addendum)
Medication Instructions:  1. INCREASE COREG TO 25 MG TWICE DAILY 2. INCREASE LISINOPRIL TO 20 MG TWICE DAILY  Labwork: 1. 03/15/16 BMET, CBC /WDIFF, TSH, LFT, BNP, FREE T4, FREE T3,   2. BMET TO BE DONE IN 1 WEEK  Testing/Procedures: CHEST X-RAY TO BE DONE AT [facility] @ [address]   Follow-Up: 03/28/16 @ 3:45 WITH SCOTT WEAVER, PAC   Any Other Special Instructions Will Be Listed Below (If Applicable).  If you need a refill on your cardiac medications before your next appointment, please call your pharmacy.

## 2016-03-14 NOTE — Addendum Note (Signed)
Addended by: Tarri Fuller on: 03/14/2016 05:27 PM   Modules accepted: Orders

## 2016-03-14 NOTE — Telephone Encounter (Signed)
Pt has appt today with Bing Neighbors. PA

## 2016-03-15 ENCOUNTER — Other Ambulatory Visit: Payer: BLUE CROSS/BLUE SHIELD | Admitting: *Deleted

## 2016-03-15 ENCOUNTER — Telehealth: Payer: Self-pay | Admitting: *Deleted

## 2016-03-15 ENCOUNTER — Ambulatory Visit
Admission: RE | Admit: 2016-03-15 | Discharge: 2016-03-15 | Disposition: A | Discharge status: Home / Self Care | Payer: BLUE CROSS/BLUE SHIELD | Source: Ambulatory Visit | Attending: Physician Assistant | Admitting: Physician Assistant

## 2016-03-15 ENCOUNTER — Other Ambulatory Visit: Payer: Self-pay | Admitting: *Deleted

## 2016-03-15 DIAGNOSIS — I5042 Chronic combined systolic (congestive) and diastolic (congestive) heart failure: Secondary | ICD-10-CM

## 2016-03-15 DIAGNOSIS — R0602 Shortness of breath: Secondary | ICD-10-CM

## 2016-03-15 DIAGNOSIS — E0590 Thyrotoxicosis, unspecified without thyrotoxic crisis or storm: Secondary | ICD-10-CM

## 2016-03-15 DIAGNOSIS — I480 Paroxysmal atrial fibrillation: Secondary | ICD-10-CM

## 2016-03-15 DIAGNOSIS — D649 Anemia, unspecified: Secondary | ICD-10-CM

## 2016-03-15 DIAGNOSIS — I10 Essential (primary) hypertension: Secondary | ICD-10-CM

## 2016-03-15 HISTORY — DX: Anemia, unspecified: D64.9

## 2016-03-15 HISTORY — DX: Thyrotoxicosis, unspecified without thyrotoxic crisis or storm: E05.90

## 2016-03-15 LAB — BASIC METABOLIC PANEL
BUN: 15 mg/dL (ref 7–25)
CHLORIDE: 107 mmol/L (ref 98–110)
CO2: 28 mmol/L (ref 20–31)
Calcium: 8.9 mg/dL (ref 8.6–10.3)
Creat: 1.05 mg/dL (ref 0.60–1.35)
Glucose, Bld: 130 mg/dL — ABNORMAL HIGH (ref 65–99)
POTASSIUM: 3.7 mmol/L (ref 3.5–5.3)
SODIUM: 141 mmol/L (ref 135–146)

## 2016-03-15 LAB — CBC
HEMATOCRIT: 34.5 % — AB (ref 38.5–50.0)
HEMOGLOBIN: 11.2 g/dL — AB (ref 13.2–17.1)
MCH: 29.2 pg (ref 27.0–33.0)
MCHC: 32.5 g/dL (ref 32.0–36.0)
MCV: 90.1 fL (ref 80.0–100.0)
MPV: 11.5 fL (ref 7.5–12.5)
Platelets: 268 10*3/uL (ref 140–400)
RBC: 3.83 MIL/uL — ABNORMAL LOW (ref 4.20–5.80)
RDW: 13.3 % (ref 11.0–15.0)
WBC: 6.5 10*3/uL (ref 3.8–10.8)

## 2016-03-15 LAB — HEPATIC FUNCTION PANEL
ALT: 43 U/L (ref 9–46)
AST: 20 U/L (ref 10–40)
Albumin: 3.4 g/dL — ABNORMAL LOW (ref 3.6–5.1)
Alkaline Phosphatase: 47 U/L (ref 40–115)
BILIRUBIN DIRECT: 0.3 mg/dL — AB (ref <=0.2)
BILIRUBIN TOTAL: 1.2 mg/dL (ref 0.2–1.2)
Indirect Bilirubin: 0.9 mg/dL (ref 0.2–1.2)
Total Protein: 6.2 g/dL (ref 6.1–8.1)

## 2016-03-15 LAB — TSH: TSH: 0.01 mIU/L — ABNORMAL LOW (ref 0.40–4.50)

## 2016-03-15 LAB — T4, FREE: Free T4: 4.4 ng/dL — ABNORMAL HIGH (ref 0.8–1.8)

## 2016-03-15 LAB — T3, FREE: T3, Free: 6.9 pg/mL — ABNORMAL HIGH (ref 2.3–4.2)

## 2016-03-15 LAB — BRAIN NATRIURETIC PEPTIDE: BRAIN NATRIURETIC PEPTIDE: 434.2 pg/mL — AB (ref <100)

## 2016-03-15 MED ORDER — SPIRONOLACTONE 25 MG PO TABS: 25.0000 mg | ORAL_TABLET | Freq: Every day | ORAL | 3 refills | Status: DC

## 2016-03-15 NOTE — Telephone Encounter (Signed)
Lmtcb to go over lab and cxr results and recommendations with the pt. I have placed in Epic the recommendations based on results given per Tereso Newcomer, PA. A referral has been placed to Endocrinology, as well as repeat lab work to be done due to medication changes. I will try to reach the pt again tomorrow to review test results with him.

## 2016-03-16 NOTE — Telephone Encounter (Signed)
Mr. Irias is returning your call. He is asking that you give him a call at work [phone]  Thanks

## 2016-03-16 NOTE — Telephone Encounter (Signed)
Pt has been notified of CXR and lab results and findings by phone with verbal understanding to recommendations. Pt is agreeable to re-start Spironolactone 25 mg daily; new Rx has been sent in, bmet 10/6 and 10/13 bmet due to start of Spironolactone. Pt will  eed these labs as well when he comes in for his bmet on 03/25/16; B12, IRON, TIBC, RBC FOLATE, FERRITIN, STOOL CARDS X 3. Pt is aware we are going to refer him to Endocrinology for his thyroid issues. Pt agreeable to all instructions and plan of care . Pt also s/w Johnathan Archer. PA on the phone today as well, pt had some questions about a-fib. Pt questions have been answered per Johnathan Archer. PA. Pt did verify for me that he does have a PCP, however he could not think of the name of the dr or the practice. I asked pt tcb with that information so that we may send over test results. Pt said ok.

## 2016-03-17 ENCOUNTER — Telehealth: Payer: Self-pay | Admitting: Physician Assistant

## 2016-03-17 NOTE — Telephone Encounter (Signed)
New Message:     Pt said he was told to call back and give the name of his primary doctor. He see Aggie Cosier at [facility] [phone].

## 2016-03-18 ENCOUNTER — Other Ambulatory Visit: Payer: Self-pay | Admitting: *Deleted

## 2016-03-18 DIAGNOSIS — D649 Anemia, unspecified: Secondary | ICD-10-CM

## 2016-03-18 NOTE — Addendum Note (Signed)
Addended by: Tarri Fuller on: 03/18/2016 09:15 AM   Modules accepted: Orders

## 2016-03-18 NOTE — Telephone Encounter (Signed)
Pt will need to have Stool Cards done with PCP. We no longer do this test in our office.

## 2016-03-21 NOTE — Telephone Encounter (Signed)
Chart update to reflect PCP per pt.  Caroll Rancher, NP [address] [place], [place] [address] Phone [phone] Fax [phone]

## 2016-03-23 ENCOUNTER — Other Ambulatory Visit: Payer: BLUE CROSS/BLUE SHIELD

## 2016-03-25 ENCOUNTER — Other Ambulatory Visit: Payer: BLUE CROSS/BLUE SHIELD

## 2016-03-28 ENCOUNTER — Ambulatory Visit: Payer: BLUE CROSS/BLUE SHIELD | Admitting: Physician Assistant

## 2016-03-28 DIAGNOSIS — I110 Hypertensive heart disease with heart failure: Secondary | ICD-10-CM

## 2016-03-28 HISTORY — DX: Hypertensive heart disease with heart failure: I11.0

## 2016-03-28 NOTE — Progress Notes (Deleted)
Cardiology Office Note:    Date:  03/28/2016   ID:  Johnathan Archer, DOB [birth year], MRN [id number]  PCP:  Caroll Rancher, NP  Cardiologist:  Dr. Delane Ginger   Electrophysiologist:  Johnathan Archer  Referring MD: No ref. provider found   No chief complaint on file. ***  History of Present Illness:    Johnathan Archer is a 45 y.o. male with a hx of chronic systolic CHF, NICM, PAF, HTN, DM, OSA.  He has a hx of running out of his blood pressure medications in the past.  He was last seen by Dr. Delane Ginger in 2/16 and then was lost to FU.  He was in the ED in 6/17 with recurrent AF with controlled rate and Amiodarone was resumed.  Last seen by me 03/14/16.      Prior CV studies that were reviewed today include:    Echo 7/14 Severe LVH, EF 20-25%, diffuse HK, restrictive physiology, moderate MR, moderate LAE, moderately reduced RVSF, mild RAE, PASP 32 mmHg, trivial pericardial effusion  LHC 6/04 Left main coronary artery is normal. Left anterior descending normal. Circumflex coronary artery normal. Right coronary artery normal. IMPRESSION: 1. Severe diffuse hypokinesis with normal coronary arteries compatible witha cardiomyopathy. 2. Increased left ventricular end-diastolic pressure. 3. No significant coronary artery disease identified.  Past Medical History:  Diagnosis Date  . Atrial fibrillation with RVR (HCC) 10/2011   lone episode, converted with IV dilt, on coumadin given elevated CHADS2  . ED (erectile dysfunction)   . History of chicken pox   . Hypertension   . Mitral regurgitation    Moderate by echo 07/2011  . OSA (obstructive sleep apnea)   . 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  cath 2004 negative for obstruction, last echo 07/2011 EF 35%. b) Normal coronary arteries by cath in 2004 (when EF was noted at 15%). c) Last EF 20% by echo 12/2012  . Type II or unspecified type diabetes  mellitus without mention of complication, uncontrolled   . Warfarin anticoagulation     Past Surgical History:  Procedure Laterality Date  . CARDIAC CATHETERIZATION  2004   no blockages  . HERNIA REPAIR  2002  . KNEE SURGERY     L knee in HS  . US ECHOCARDIOGRAPHY  07/2011   mod LVH, EF 35%, diffuse hypokinesis of entire myocardium, irreversible restrictive pattern (grade 4 diastolic dysfunction), mod MR, mod dilated LA/RA, decreased RV systolic fxn, no PFO  . US ECHOCARDIOGRAPHY  12/2012   severe LVH, EF 20-25%, mod MR, reduced RV systolic function    Current Medications: No outpatient prescriptions have been marked as taking for the 03/28/16 encounter (Appointment) with Beatrice Lecher, PA-C.     Allergies:   Review of patient's allergies indicates no known allergies.   Social History   Social History  . Marital status: Married    Spouse name: Johnathan Archer  . Number of children: Johnathan Archer  . Years of education: Johnathan Archer   Social History Main Topics  . Smoking status: Never Smoker  . Smokeless tobacco: Never Used  . Alcohol use No     Comment: Occasional 1x/mo  . Drug use: No  . Sexual activity: Not on file   Other Topics Concern  . Not on file   Social History Narrative   Caffeine: none   Lives with wife and daughter (8yo), no pets   Occupation: Naval architect   Edu:  5239yr college   Activity: works, no regular activity   Diet: water daily, tries fruits/vegetables daily     Family History:  The patient's ***family history includes Diabetes in his mother; Hypertension in his father.   ROS:   Please see the history of present illness.    ROS All other systems reviewed and are negative.   EKGs/Labs/Other Test Reviewed:    EKG:  EKG is *** ordered today.  The ekg ordered today demonstrates ***  Recent Labs: 12/14/2015: Magnesium 1.9 03/15/2016: ALT 43; Brain Natriuretic Peptide 434.2; BUN 15; Creat 1.05; Hemoglobin 11.2; Platelets 268; Potassium 3.7; Sodium 141; TSH 0.01    Recent Lipid Panel    Component Value Date/Time   CHOL 191 06/26/2014 1526   TRIG 223.0 (H) 06/26/2014 1526   HDL 40.30 06/26/2014 1526   CHOLHDL 5 06/26/2014 1526   VLDL 44.6 (H) 06/26/2014 1526   LDLCALC 85 05/12/2014 1624   LDLDIRECT 111.5 06/26/2014 1526     Physical Exam:    VS:  There were no vitals taken for this visit.    Wt Readings from Last 3 Encounters:  03/14/16 217 lb 6.4 oz (98.6 kg)  02/29/16 212 lb 12.8 oz (96.5 kg)  10/27/15 215 lb (97.5 kg)     ***Physical Exam  ASSESSMENT:    No diagnosis found. PLAN:    In order of problems listed above:  1. HTN - *** 2. Chronic combined systolic and diastolic CHF - *** 3. NICM - EF 20-25 in 2014.  Plan repeat Echo once his blood pressure is better controlled.  4. PAF - *** CHADS2-VASc=3.  He remains on Xarelto and Amiodarone.  *** He now has hyperthyroidism.  We have decided to keep him on Amiodarone for now as rhythm control options are limited.  QTc is too long for Dofetilide. We may need to refer to EP/AFib clinic if Amiodarone has to be DC'd.  *** 5. Hyperthyroidism - Recent TSH low and Free T4/T3 high. This may be related to Amiodarone.  However, options for rhythm control are limited.    *** 6. Chest pain - *** 7. Anemia - Normocytic.  He has been referred back to his PCP for evaluation of anemia.  8. OSA - He has already been referred to Dr. Armanda Magicraci Turner for FU.  Medication Adjustments/Labs and Tests Ordered: Current medicines are reviewed at length with the patient today.  Concerns regarding medicines are outlined above.  Medication changes, Labs and Tests ordered today are outlined in the Patient Instructions noted below. There are no Patient Instructions on file for this visit. Signed, Tereso NewcomerScott Sonyia Muro, PA-C  03/28/2016 3:33 PM    [facility] [address], [place], [place]  [address] Phone: [phone]; Fax: [phone]

## 2016-03-30 ENCOUNTER — Encounter: Payer: Self-pay | Admitting: Physician Assistant

## 2016-04-01 ENCOUNTER — Other Ambulatory Visit: Payer: BLUE CROSS/BLUE SHIELD

## 2016-04-08 ENCOUNTER — Encounter: Payer: Self-pay | Admitting: *Deleted

## 2016-04-08 ENCOUNTER — Telehealth: Payer: Self-pay | Admitting: *Deleted

## 2016-04-12 ENCOUNTER — Ambulatory Visit: Payer: BLUE CROSS/BLUE SHIELD | Admitting: Endocrinology

## 2016-04-15 NOTE — Telephone Encounter (Signed)
x

## 2016-04-18 ENCOUNTER — Telehealth: Payer: Self-pay | Admitting: Endocrinology

## 2016-04-18 NOTE — Telephone Encounter (Signed)
New message  Pt wants to reschedule new patient missed appt on 10/24, but has conflict with work schedule  Unable to schedule him on days requested  Please call back

## 2016-04-18 NOTE — Telephone Encounter (Signed)
Please offer next avail new pt appt

## 2016-04-19 NOTE — Telephone Encounter (Signed)
Caitlin, Could you please call this patient to reschedule for his next new appointment slot?

## 2016-04-20 ENCOUNTER — Ambulatory Visit: Payer: BLUE CROSS/BLUE SHIELD | Admitting: Endocrinology

## 2016-04-20 DIAGNOSIS — Z0289 Encounter for other administrative examinations: Secondary | ICD-10-CM

## 2016-04-25 ENCOUNTER — Encounter: Payer: Self-pay | Admitting: Physician Assistant

## 2016-04-25 ENCOUNTER — Ambulatory Visit (INDEPENDENT_AMBULATORY_CARE_PROVIDER_SITE_OTHER): Payer: BLUE CROSS/BLUE SHIELD | Admitting: Physician Assistant

## 2016-04-25 VITALS — BP 160/90 | HR 84 | Ht 71.0 in | Wt 212.8 lb

## 2016-04-25 DIAGNOSIS — I5042 Chronic combined systolic (congestive) and diastolic (congestive) heart failure: Secondary | ICD-10-CM | POA: Diagnosis not present

## 2016-04-25 DIAGNOSIS — I428 Other cardiomyopathies: Secondary | ICD-10-CM

## 2016-04-25 DIAGNOSIS — I110 Hypertensive heart disease with heart failure: Secondary | ICD-10-CM | POA: Diagnosis not present

## 2016-04-25 DIAGNOSIS — I480 Paroxysmal atrial fibrillation: Secondary | ICD-10-CM | POA: Diagnosis not present

## 2016-04-25 DIAGNOSIS — E0590 Thyrotoxicosis, unspecified without thyrotoxic crisis or storm: Secondary | ICD-10-CM

## 2016-04-25 NOTE — Patient Instructions (Addendum)
Medication Instructions:  1. INCREASE LASIX TO 80 MG TWICE DAILY FOR THREE DAYS AFTER THE 3 DAYS RESUME LASIX 40 MG TWICE DAILY 2. RESUME LISINOPRIL 20 MG TWICE DAILY  Labwork: 1. TODAY BMET, BNP 2. IN 1 WEEK YOU WILL NEED A REPEAT BMET  Testing/Procedures: NONE  Follow-Up: SCOTT WEAVER, [facility] ON 05/17/16 @ 2:45  DR. ELLISON'S OFFICE IS GOING TO CALL YOU TO MAKE AN APPT  Any Other Special Instructions Will Be Listed Below (If Applicable).  If you need a refill on your cardiac medications before your next appointment, please call your pharmacy.

## 2016-04-25 NOTE — Progress Notes (Signed)
Cardiology Office Note:    Date:  04/25/2016   ID:  Johnathan Archer, DOB 12-08-1970, MRN [id number]  PCP:  Caroll Rancher, NP  Cardiologist:  Dr. Delane Ginger   Electrophysiologist:  n/a  Referring MD: Caroll Rancher, NP   Chief Complaint  Patient presents with  . Follow-up    CHF, HTN    History of Present Illness:    Johnathan Archer is a 45 y.o. male with a hx of chronic systolic CHF, nonischemic cardiomyopathy, atrial fibrillation, HTN, diabetes, sleep apnea. Patient was previously followed by Dr. Donnie Aho before establishing with Dr. Elease Hashimoto. He has run out of his medication several times in the past. Last seen by Dr. Elease Hashimoto 2/16. He was lost to FU.  He was then seen in the ED in 6/17 with AF with controlled rate. He was not taking his Amiodarone and this was resumed.   I have seen him several times recently.  His BP has been difficult to control.  He has been non-adherent with medications in the past.  However, recently, he has been good about taking his medications. He has missed his last few appointments.  His TSH was recently low.  I referred him to Endocrinology.  He has not yet seen Endocrine.  His Hgb was also low and I asked him to FU with his PCP.  He ran out of Lisinopril 2 weeks ago. He has not been able to get it from the pharmacy.  We called and there was a mix up with dose change and the insurance denied the renewal.  He is here alone today. He denies chest pain, significant dyspnea on exertion, orthopnea, PND.  He has noted worsening edema in his legs. He has taken an extra Lasix the last 2 days. He denies syncope.  His cough is improved.  His PCP tx him for a URI.   Prior CV studies that were reviewed today include:    Echo 7/14 Severe LVH, EF 20-25%, diffuse HK, restrictive physiology, moderate MR, moderate LAE, moderately reduced RVSF, mild RAE, PASP 32 mmHg, trivial pericardial effusion  LHC 6/04 Left main coronary artery is normal. Left anterior  descending normal. Circumflex coronary artery normal. Right coronary artery normal. IMPRESSION: 1. Severe diffuse hypokinesis with normal coronary arteries compatible witha cardiomyopathy. 2. Increased left ventricular end-diastolic pressure. 3. No significant coronary artery disease identified.  Past Medical History:  Diagnosis Date  . Atrial fibrillation with RVR (HCC) 10/2011   lone episode, converted with IV dilt, on coumadin given elevated CHADS2  . ED (erectile dysfunction)   . History of chicken pox   . Hypertension   . Mitral regurgitation    Moderate by echo 07/2011  . OSA (obstructive sleep apnea)   . 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  cath 2004 negative for obstruction, last echo 07/2011 EF 35%. b) Normal coronary arteries by cath in 2004 (when EF was noted at 15%). c) Last EF 20% by echo 12/2012  . Type II or unspecified type diabetes mellitus without mention of complication, uncontrolled   . Warfarin anticoagulation     Past Surgical History:  Procedure Laterality Date  . CARDIAC CATHETERIZATION  2004   no blockages  . HERNIA REPAIR  2002  . KNEE SURGERY     L knee in HS  . US ECHOCARDIOGRAPHY  07/2011   mod LVH, EF 35%, diffuse hypokinesis of entire myocardium, irreversible restrictive pattern (grade  4 diastolic dysfunction), mod MR, mod dilated LA/RA, decreased RV systolic fxn, no PFO  . US ECHOCARDIOGRAPHY  12/2012   severe LVH, EF 20-25%, mod MR, reduced RV systolic function    Current Medications: Current Meds  Medication Sig  . amiodarone (PACERONE) 200 MG tablet Take 1 tablet (200 mg total) by mouth daily.  . benzonatate (TESSALON) 100 MG capsule Take 100 mg by mouth every 8 (eight) hours as needed for cough.  . carvedilol (COREG) 25 MG tablet Take 1 tablet (25 mg total) by mouth 2 (two) times daily.  . furosemide (LASIX) 40 MG tablet Take 40 mg by mouth 2 (two) times daily.     Marland Kitchen. GLIPIZIDE PO Take 2 mg by mouth daily.  . hydrALAZINE (APRESOLINE) 50 MG tablet Take 1 tablet (50 mg total) by mouth 2 (two) times daily.  . isosorbide mononitrate (IMDUR) 30 MG 24 hr tablet Take 0.5 tablets (15 mg total) by mouth daily. Please call and schedule an appointment  . lisinopril (PRINIVIL,ZESTRIL) 20 MG tablet Take 1 tablet (20 mg total) by mouth 2 (two) times daily.  . metFORMIN (GLUCOPHAGE) 500 MG tablet Take 1 tablet (500 mg total) by mouth 2 (two) times daily with a meal.  . rivaroxaban (XARELTO) 20 MG TABS tablet Take 1 tablet (20 mg total) by mouth daily with supper.  Marland Kitchen. spironolactone (ALDACTONE) 25 MG tablet Take 1 tablet (25 mg total) by mouth daily.     Allergies:   Patient has no known allergies.   Social History   Social History  . Marital status: Married    Spouse name: [name]  . Number of children: [name]  . Years of education: [name]   Social History Main Topics  . Smoking status: Never Smoker  . Smokeless tobacco: Never Used  . Alcohol use No     Comment: Occasional 1x/mo  . Drug use: No  . Sexual activity: Not Asked   Other Topics Concern  . None   Social History Narrative   Caffeine: none   Lives with wife and daughter (8yo), no pets   Occupation: Naval architectestaurant Manager   Edu: 8242yr college   Activity: works, no regular activity   Diet: water daily, tries fruits/vegetables daily     Family History:  The patient's family history includes Diabetes in his mother; Hypertension in his father.   ROS:   Please see the history of present illness.    ROS All other systems reviewed and are negative.   EKGs/Labs/Other Test Reviewed:    EKG:  EKG is ordered today.  The ekg ordered today demonstrates NSR, HR 84, normal axis, QTc 519 ms  Recent Labs: 12/14/2015: Magnesium 1.9 03/15/2016: ALT 43; Brain Natriuretic Peptide 434.2; BUN 15; Creat 1.05; Hemoglobin 11.2; Platelets 268; Potassium 3.7; Sodium 141; TSH 0.01   Recent Lipid Panel    Component Value  Date/Time   CHOL 191 06/26/2014 1526   TRIG 223.0 (H) 06/26/2014 1526   HDL 40.30 06/26/2014 1526   CHOLHDL 5 06/26/2014 1526   VLDL 44.6 (H) 06/26/2014 1526   LDLCALC 85 05/12/2014 1624   LDLDIRECT 111.5 06/26/2014 1526     Physical Exam:    VS:  BP (!) 160/90   Pulse 84   Ht 5\' 11"  (1.803 m)   Wt 212 lb 12.8 oz (96.5 kg)   BMI 29.68 kg/m     Wt Readings from Last 3 Encounters:  04/25/16 212 lb 12.8 oz (96.5 kg)  03/14/16 217 lb 6.4 oz (98.6  kg)  02/29/16 212 lb 12.8 oz (96.5 kg)     Physical Exam  Constitutional: He is oriented to person, place, and time. He appears well-developed and well-nourished. No distress.  HENT:  Head: Normocephalic and atraumatic.  Eyes: No scleral icterus.  Neck: JVD present.  JVP ~ 7 cm  Cardiovascular: Normal rate, regular rhythm and normal heart sounds.   No murmur heard. Pulmonary/Chest: Effort normal. He has no wheezes. He has no rales.  Abdominal: Soft. There is no tenderness.  Musculoskeletal: Normal range of motion. He exhibits no edema.  Neurological: He is alert and oriented to person, place, and time.  Skin: Skin is warm and dry.  Psychiatric: He has a normal mood and affect.    ASSESSMENT:    1. Hypertensive heart disease with heart failure (HCC)   2. Chronic combined systolic and diastolic CHF (congestive heart failure) (HCC)   3. Nonischemic cardiomyopathy (HCC)   4. Paroxysmal atrial fibrillation (HCC)   5. Hyperthyroidism    PLAN:    In order of problems listed above:  1. HTN - BP is better controlled.  He is off Lisinopril.  We corrected his Rx with the pharmacy.  He should resume the Lisinopril today.  Continue Coreg, Hydralazine, Nitrates, Spironolactone.  Will get FU BMET, today and repeat in 1 week.  FU here in 2 weeks.   2. Chronic combined systolic and diastolic CHF - He is s/w volume overloaded.    -  Increase Lasix to 80 bid x 3 days, then resume 40 bid.  -  BMET, BNP today and repeat BMET in 1 week.  -   Continue beta blocker, ACE, aldo antagonist, hydralazine, nitrates.  3. NICM - EF 20-25% at last Echo in 2014. Suspect related to uncontrolled BP.  BP is closer to target.  If BP close to goal at FU, will arrange FU Echo.   4. PAF - Maintaining NSR today.  He has occ episodes of AF that lasts a few hours.  He remains on Amiodarone and Xarelto. CHADS2-VASc=3.  continue current Rx.  5. Hyperthyroidism - He missed a couple appointments with Endocrinology. We tried to schedule again for him today but his work schedule is prohibitive.  We will give him a number to call to schedule.  Hopefully, he can remain on Amiodarone to control his AFib.   Medication Adjustments/Labs and Tests Ordered: Current medicines are reviewed at length with the patient today.  Concerns regarding medicines are outlined above.  Medication changes, Labs and Tests ordered today are outlined in the Patient Instructions noted below. Patient Instructions  Medication Instructions:  1. INCREASE LASIX TO 80 MG TWICE DAILY FOR THREE DAYS AFTER THE 3 DAYS RESUME LASIX 40 MG TWICE DAILY 2. RESUME LISINOPRIL 20 MG TWICE DAILY  Labwork: 1. TODAY BMET, BNP 2. IN 1 WEEK YOU WILL NEED A REPEAT BMET  Testing/Procedures: NONE  Follow-Up: Press Casale, [facility] ON 05/17/16 @ 2:45  DR. ELLISON'S OFFICE IS GOING TO CALL YOU TO MAKE AN APPT  Any Other Special Instructions Will Be Listed Below (If Applicable).  If you need a refill on your cardiac medications before your next appointment, please call your pharmacy.  Signed, Tereso Newcomer, PA-C  04/25/2016 4:49 PM    [facility] [address] [place], [place], [place]  [address] Phone: [phone]; Fax: [phone]

## 2016-04-26 LAB — BASIC METABOLIC PANEL
BUN: 17 mg/dL (ref 7–25)
CHLORIDE: 105 mmol/L (ref 98–110)
CO2: 27 mmol/L (ref 20–31)
Calcium: 8.9 mg/dL (ref 8.6–10.3)
Creat: 1.01 mg/dL (ref 0.60–1.35)
Glucose, Bld: 250 mg/dL — ABNORMAL HIGH (ref 65–99)
POTASSIUM: 3.7 mmol/L (ref 3.5–5.3)
SODIUM: 142 mmol/L (ref 135–146)

## 2016-04-26 LAB — BRAIN NATRIURETIC PEPTIDE: BRAIN NATRIURETIC PEPTIDE: 439.2 pg/mL — AB (ref <100)

## 2016-04-27 ENCOUNTER — Telehealth: Payer: Self-pay | Admitting: *Deleted

## 2016-04-27 NOTE — Telephone Encounter (Signed)
lmtcb to go over lab results .  

## 2016-04-29 NOTE — Telephone Encounter (Signed)
Pt notified lab results by phone with verbal understanding 

## 2016-05-03 ENCOUNTER — Other Ambulatory Visit: Payer: BLUE CROSS/BLUE SHIELD

## 2016-05-17 ENCOUNTER — Ambulatory Visit: Payer: BLUE CROSS/BLUE SHIELD | Admitting: Physician Assistant

## 2016-05-17 NOTE — Progress Notes (Deleted)
Cardiology Office Note:    Date:  05/17/2016   ID:  Johnathan Archer, DOB 11/12/1970, MRN [id number]  PCP:  Caroll RancherGRAY, REBECCA, NP  Cardiologist:  Dr. Delane GingerPhil Nahser   Electrophysiologist:  n/a  Referring MD: Caroll RancherGray, Rebecca, NP   No chief complaint on file. ***  History of Present Illness:    Johnathan Archer is a 45 y.o. male with a hx of chronic systolic CHF, nonischemic cardiomyopathy, atrial fibrillation, HTN, diabetes, sleep apnea. Patient was previously followed by Dr. Donnie Ahoilley before establishing with Dr. Elease HashimotoNahser. He has run out of his medication several times in the past. Last seen by Dr. Elease HashimotoNahser 2/16. He was lost to FU. He was thenseen in the ED in 6/17 with AF with controlled rate. He was not taking his Amiodarone and this was resumed.  I have seen him several times recently.  His BP has been difficult to control.  He has been non-adherent with medications in the past.  However, recently, he has been good about taking his medications.  His TSH was recently low.  I referred him to Endocrinology.  Last seen 04/25/16.  He returns for Cardiology follow up.  ***  Prior CV studies that were reviewed today include:    Echo 7/14 Severe LVH, EF 20-25%, diffuse HK, restrictive physiology, moderate MR, moderate LAE, moderately reduced RVSF, mild RAE, PASP 32 mmHg, trivial pericardial effusion  LHC 6/04 Left main coronary artery is normal. Left anterior descending normal. Circumflex coronary artery normal. Right coronary artery normal. IMPRESSION: 1. Severe diffuse hypokinesis with normal coronary arteries compatible witha cardiomyopathy. 2. Increased left ventricular end-diastolic pressure. 3. No significant coronary artery disease identified.  Past Medical History:  Diagnosis Date  . Atrial fibrillation with RVR (HCC) 10/2011   lone episode, converted with IV dilt, on coumadin given elevated CHADS2  . ED (erectile dysfunction)   . History of chicken pox   . Hypertension     . Mitral regurgitation    Moderate by echo 07/2011  . OSA (obstructive sleep apnea)   . 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  cath 2004 negative for obstruction, last echo 07/2011 EF 35%. b) Normal coronary arteries by cath in 2004 (when EF was noted at 15%). c) Last EF 20% by echo 12/2012  . Type II or unspecified type diabetes mellitus without mention of complication, uncontrolled   . Warfarin anticoagulation     Past Surgical History:  Procedure Laterality Date  . CARDIAC CATHETERIZATION  2004   no blockages  . HERNIA REPAIR  2002  . KNEE SURGERY     L knee in HS  . US ECHOCARDIOGRAPHY  07/2011   mod LVH, EF 35%, diffuse hypokinesis of entire myocardium, irreversible restrictive pattern (grade 4 diastolic dysfunction), mod MR, mod dilated LA/RA, decreased RV systolic fxn, no PFO  . US ECHOCARDIOGRAPHY  12/2012   severe LVH, EF 20-25%, mod MR, reduced RV systolic function    Current Medications: No outpatient prescriptions have been marked as taking for the 05/17/16 encounter (Appointment) with Beatrice LecherScott T Loranzo Desha, PA-C.     Allergies:   Patient has no known allergies.   Social History   Social History  . Marital status: Married    Spouse name: [name]  . Number of children: [name]  . Years of education: [name]   Social History Main Topics  . Smoking status: Never Smoker  . Smokeless tobacco: Never Used  .  Alcohol use No     Comment: Occasional 1x/mo  . Drug use: No  . Sexual activity: Not on file   Other Topics Concern  . Not on file   Social History Narrative   Caffeine: none   Lives with wife and daughter (8yo), no pets   Occupation: Naval architect   Edu: 69yr college   Activity: works, no regular activity   Diet: water daily, tries fruits/vegetables daily     Family History:  The patient's ***family history includes Diabetes in his mother; Hypertension in his father.   ROS:   Please see  the history of present illness.    ROS All other systems reviewed and are negative.   EKGs/Labs/Other Test Reviewed:    EKG:  EKG is *** ordered today.  The ekg ordered today demonstrates ***  Recent Labs: 12/14/2015: Magnesium 1.9 03/15/2016: ALT 43; Hemoglobin 11.2; Platelets 268; TSH 0.01 04/25/2016: Brain Natriuretic Peptide 439.2; BUN 17; Creat 1.01; Potassium 3.7; Sodium 142   Recent Lipid Panel    Component Value Date/Time   CHOL 191 06/26/2014 1526   TRIG 223.0 (H) 06/26/2014 1526   HDL 40.30 06/26/2014 1526   CHOLHDL 5 06/26/2014 1526   VLDL 44.6 (H) 06/26/2014 1526   LDLCALC 85 05/12/2014 1624   LDLDIRECT 111.5 06/26/2014 1526     Physical Exam:    VS:  There were no vitals taken for this visit.    Wt Readings from Last 3 Encounters:  04/25/16 212 lb 12.8 oz (96.5 kg)  03/14/16 217 lb 6.4 oz (98.6 kg)  02/29/16 212 lb 12.8 oz (96.5 kg)     ***Physical Exam  ASSESSMENT:    No diagnosis found. PLAN:    In order of problems listed above:  1. HTN - *** 2. Chronic combined systolic and diastolic CHF -  ***             -  Continue beta blocker, ACE, aldo antagonist, hydralazine, nitrates. 3. NICM - EF 20-25% at last Echo in 2014. Suspect related to uncontrolled BP.  ***Echo 4. PAF - Maintaining *** NSR today.  *** He remainson Amiodarone and Xarelto. CHADS2-VASc=3.  ***Continue current Rx. 5. Hyperthyroidism - *** Hopefully, he can remain on Amiodarone to control his AFib.   Medication Adjustments/Labs and Tests Ordered: Current medicines are reviewed at length with the patient today.  Concerns regarding medicines are outlined above.  Medication changes, Labs and Tests ordered today are outlined in the Patient Instructions noted below. There are no Patient Instructions on file for this visit. Signed, Tereso Newcomer, PA-C  05/17/2016 1:44 PM    [facility] [address] [place], [place], [place]  [address] Phone: [phone]; Fax: [phone]

## 2016-05-18 ENCOUNTER — Other Ambulatory Visit: Payer: BLUE CROSS/BLUE SHIELD | Admitting: *Deleted

## 2016-05-18 ENCOUNTER — Other Ambulatory Visit: Payer: Self-pay | Admitting: *Deleted

## 2016-05-18 ENCOUNTER — Ambulatory Visit: Payer: BLUE CROSS/BLUE SHIELD | Admitting: Physician Assistant

## 2016-05-18 DIAGNOSIS — I5042 Chronic combined systolic (congestive) and diastolic (congestive) heart failure: Secondary | ICD-10-CM

## 2016-05-18 LAB — BASIC METABOLIC PANEL
BUN: 22 mg/dL (ref 7–25)
CO2: 27 mmol/L (ref 20–31)
Calcium: 9.1 mg/dL (ref 8.6–10.3)
Chloride: 104 mmol/L (ref 98–110)
Creat: 1.2 mg/dL (ref 0.60–1.35)
GLUCOSE: 266 mg/dL — AB (ref 65–99)
POTASSIUM: 4.2 mmol/L (ref 3.5–5.3)
Sodium: 139 mmol/L (ref 135–146)

## 2016-05-18 MED ORDER — FUROSEMIDE 40 MG PO TABS: 40.0000 mg | ORAL_TABLET | Freq: Two times a day (BID) | ORAL | 3 refills | Status: DC

## 2016-05-18 NOTE — Progress Notes (Deleted)
Cardiology Office Note:    Date:  05/18/2016   ID:  Johnathan Archer, DOB Jun 09, 1971, MRN [id number]  PCP:  Caroll Rancher, NP  Cardiologist:  Dr. Delane Ginger   Electrophysiologist:  n/a  Referring MD: Caroll Rancher, NP   No chief complaint on file. ***  History of Present Illness:    Johnathan Archer is a 45 y.o. male with a hx of  chronic systolic CHF, nonischemic cardiomyopathy, atrial fibrillation, HTN, diabetes, sleep apnea. Patient was previously followed by Dr. Donnie Aho before establishing with Dr. Elease Hashimoto. He has run out of his medication several times in the past. Last seen by Dr. Elease Hashimoto 2/16. He was lost to FU. He was thenseen in the ED in 6/17 with AF with controlled rate. He was not taking his Amiodarone and this was resumed.  I have seen him several times recently. His BP has been difficult to control. He has been non-adherent with medications in the past. However, recently, he has been good about taking his medications. His TSH was recently low. I referred him to Endocrinology. Last seen 04/25/16.  He returns for Cardiology follow up.  ***  Prior CV studies that were reviewed today include:    Echo 7/14 Severe LVH, EF 20-25%, diffuse HK, restrictive physiology, moderate MR, moderate LAE, moderately reduced RVSF, mild RAE, PASP 32 mmHg, trivial pericardial effusion  LHC 6/04 Left main coronary artery is normal. Left anterior descending normal. Circumflex coronary artery normal. Right coronary artery normal. IMPRESSION: 1. Severe diffuse hypokinesis with normal coronary arteries compatible witha cardiomyopathy. 2. Increased left ventricular end-diastolic pressure. 3. No significant coronary artery disease identified.  Past Medical History:  Diagnosis Date  . Atrial fibrillation with RVR (HCC) 10/2011   lone episode, converted with IV dilt, on coumadin given elevated CHADS2  . ED (erectile dysfunction)   . History of chicken pox   . Hypertension    . Mitral regurgitation    Moderate by echo 07/2011  . OSA (obstructive sleep apnea)   . 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  cath 2004 negative for obstruction, last echo 07/2011 EF 35%. b) Normal coronary arteries by cath in 2004 (when EF was noted at 15%). c) Last EF 20% by echo 12/2012  . Type II or unspecified type diabetes mellitus without mention of complication, uncontrolled   . Warfarin anticoagulation     Past Surgical History:  Procedure Laterality Date  . CARDIAC CATHETERIZATION  2004   no blockages  . HERNIA REPAIR  2002  . KNEE SURGERY     L knee in HS  . US ECHOCARDIOGRAPHY  07/2011   mod LVH, EF 35%, diffuse hypokinesis of entire myocardium, irreversible restrictive pattern (grade 4 diastolic dysfunction), mod MR, mod dilated LA/RA, decreased RV systolic fxn, no PFO  . US ECHOCARDIOGRAPHY  12/2012   severe LVH, EF 20-25%, mod MR, reduced RV systolic function    Current Medications: No outpatient prescriptions have been marked as taking for the 05/18/16 encounter (Appointment) with Beatrice Lecher, PA-C.     Allergies:   Patient has no known allergies.   Social History   Social History  . Marital status: Married    Spouse name: [name]  . Number of children: [name]  . Years of education: [name]   Social History Main Topics  . Smoking status: Never Smoker  . Smokeless tobacco: Never Used  . Alcohol use No  Comment: Occasional 1x/mo  . Drug use: No  . Sexual activity: Not on file   Other Topics Concern  . Not on file   Social History Narrative   Caffeine: none   Lives with wife and daughter (8yo), no pets   Occupation: Naval architectestaurant Manager   Edu: [employer]yr college   Activity: works, no regular activity   Diet: water daily, tries fruits/vegetables daily     Family History:  The patient's ***family history includes Diabetes in his mother; Hypertension in his father.   ROS:   Please see  the history of present illness.    ROS All other systems reviewed and are negative.   EKGs/Labs/Other Test Reviewed:    EKG:  EKG is *** ordered today.  The ekg ordered today demonstrates ***  Recent Labs: 12/14/2015: Magnesium 1.9 03/15/2016: ALT 43; Hemoglobin 11.2; Platelets 268; TSH 0.01 04/25/2016: Brain Natriuretic Peptide 439.2; BUN 17; Creat 1.01; Potassium 3.7; Sodium 142   Recent Lipid Panel    Component Value Date/Time   CHOL 191 06/26/2014 1526   TRIG 223.0 (H) 06/26/2014 1526   HDL 40.30 06/26/2014 1526   CHOLHDL 5 06/26/2014 1526   VLDL 44.6 (H) 06/26/2014 1526   LDLCALC 85 05/12/2014 1624   LDLDIRECT 111.5 06/26/2014 1526     Physical Exam:    VS:  There were no vitals taken for this visit.    Wt Readings from Last 3 Encounters:  04/25/16 212 lb 12.8 oz (96.5 kg)  03/14/16 217 lb 6.4 oz (98.6 kg)  02/29/16 212 lb 12.8 oz (96.5 kg)     ***Physical Exam  ASSESSMENT:    No diagnosis found. PLAN:    In order of problems listed above:  1. HTN - *** 2. Chronic combined systolic and diastolic CHF -  *** - Continue beta blocker, ACE, aldo antagonist, hydralazine, nitrates. 3. NICM - EF 20-25% at last Echo in 2014. Suspect related to uncontrolled BP. ***Echo 4. PAF - Maintaining *** NSR today. *** He remainson Amiodarone and Xarelto. CHADS2-VASc=3.  ***Continue current Rx. 5. Hyperthyroidism - ***Hopefully, he can remain on Amiodarone to control his AFib.   Medication Adjustments/Labs and Tests Ordered: Current medicines are reviewed at length with the patient today.  Concerns regarding medicines are outlined above.  Medication changes, Labs and Tests ordered today are outlined in the Patient Instructions noted below. There are no Patient Instructions on file for this visit. Signed, Tereso NewcomerScott Josetta Wigal, PA-C  05/18/2016 8:07 AM    [facility] [address], [place], [place]  [address] Phone: [phone]; Fax: [phone]  [phone]

## 2016-05-20 ENCOUNTER — Ambulatory Visit: Payer: BLUE CROSS/BLUE SHIELD | Admitting: Cardiology

## 2016-05-20 ENCOUNTER — Telehealth: Payer: Self-pay | Admitting: *Deleted

## 2016-05-20 NOTE — Telephone Encounter (Signed)
Lmtcb . See results notes where Johnathan Archer. RN s/w pt in regards to results. At that time pt asked to have Avard W. PA assist call him back since he has a few questions.

## 2016-05-24 ENCOUNTER — Telehealth: Payer: Self-pay | Admitting: *Deleted

## 2016-05-24 DIAGNOSIS — I110 Hypertensive heart disease with heart failure: Secondary | ICD-10-CM

## 2016-05-24 DIAGNOSIS — I5042 Chronic combined systolic (congestive) and diastolic (congestive) heart failure: Secondary | ICD-10-CM

## 2016-05-24 MED ORDER — FUROSEMIDE 40 MG PO TABS: 40.0000 mg | ORAL_TABLET | Freq: Two times a day (BID) | ORAL | 3 refills | Status: DC

## 2016-05-24 MED ORDER — LISINOPRIL 20 MG PO TABS: 20.0000 mg | ORAL_TABLET | Freq: Two times a day (BID) | ORAL | 3 refills | Status: DC

## 2016-05-24 NOTE — Telephone Encounter (Signed)
Spoke with the patient who called saying he needed cpap supplies and a new cpap machine. He was scheduled on 05/20/16 for a new sleep appointment but was cancelled due to provider out sick  He states that his last sleep study was 14 years ago. He has been using his machine but he feels it is not working effectively because,he has started feeling tired and sleepy again through out the day. He wanted to get in to Dr Mayford Knife before the end of the year and we tried to get him in on 05/24/16 but he said he had another appointment that day. We offered him 07/14/16 but he wants a sooner appointment. He is willing to see another sleep provider in order to get an earlier appointment. Dr Tresa Endo has an opening we can put him in if that is ok with Dr Mayford Knife for him to switch.

## 2016-05-24 NOTE — Telephone Encounter (Signed)
Pt said he was calling because his Lisinopril and lasix were not at the pharmacy. I advised pt that they were sent back on 05/18/16 and if you don't pick up medications after a certain number of days the pharmacy will usually put the meds back on the shelf. I have resent Lasix and Lisinopril now for the pt.

## 2016-05-24 NOTE — Telephone Encounter (Signed)
I am fine with him switching to whoever has the earliest appt

## 2016-05-25 ENCOUNTER — Encounter: Payer: Self-pay | Admitting: Physician Assistant

## 2016-05-25 ENCOUNTER — Ambulatory Visit (INDEPENDENT_AMBULATORY_CARE_PROVIDER_SITE_OTHER): Payer: BLUE CROSS/BLUE SHIELD | Admitting: Physician Assistant

## 2016-05-25 ENCOUNTER — Encounter (INDEPENDENT_AMBULATORY_CARE_PROVIDER_SITE_OTHER): Payer: Self-pay

## 2016-05-25 VITALS — BP 170/100 | HR 71 | Ht 70.0 in | Wt 197.1 lb

## 2016-05-25 DIAGNOSIS — E0590 Thyrotoxicosis, unspecified without thyrotoxic crisis or storm: Secondary | ICD-10-CM

## 2016-05-25 DIAGNOSIS — I5042 Chronic combined systolic (congestive) and diastolic (congestive) heart failure: Secondary | ICD-10-CM | POA: Diagnosis not present

## 2016-05-25 DIAGNOSIS — I110 Hypertensive heart disease with heart failure: Secondary | ICD-10-CM | POA: Diagnosis not present

## 2016-05-25 DIAGNOSIS — I480 Paroxysmal atrial fibrillation: Secondary | ICD-10-CM | POA: Diagnosis not present

## 2016-05-25 DIAGNOSIS — I428 Other cardiomyopathies: Secondary | ICD-10-CM | POA: Diagnosis not present

## 2016-05-25 MED ORDER — HYDRALAZINE HCL 50 MG PO TABS: 75.0000 mg | ORAL_TABLET | Freq: Three times a day (TID) | ORAL | 3 refills | Status: DC

## 2016-05-25 MED ORDER — HYDRALAZINE HCL 50 MG PO TABS: 50.0000 mg | ORAL_TABLET | Freq: Three times a day (TID) | ORAL | 3 refills | Status: DC

## 2016-05-25 MED ORDER — ISOSORBIDE MONONITRATE ER 30 MG PO TB24: 30.0000 mg | ORAL_TABLET | Freq: Every day | ORAL | 3 refills | Status: DC

## 2016-05-25 MED ORDER — AMIODARONE HCL 200 MG PO TABS: 200.0000 mg | ORAL_TABLET | Freq: Every day | ORAL | 3 refills | Status: DC

## 2016-05-25 NOTE — Patient Instructions (Addendum)
Medication Instructions:  1. START IMDUR 30 MG DAILY; NEW RX HAS BEEN SENT IN  2. INCREASE HYDRALAZINE TO 50 MG THREE TIMES A DAY; NEW RX HAS BEEN SENT IN 3. A REFILL FOR AMIODARONE WAS SENT IN TODAY Labwork: NONE  Testing/Procedures: NONE  Follow-Up: 1. 05/27/16 @ 9:15 WITH DR. Everardo All AT [address], [place], [place] [address]  Phone: [phone]  2. SCOTT WEAVER, [facility] 06/27/16 @ 9:15   Any Other Special Instructions Will Be Listed Below (If Applicable).  If you need a refill on your cardiac medications before your next appointment, please call your pharmacy.

## 2016-05-25 NOTE — Progress Notes (Signed)
Cardiology Office Note:    Date:  05/25/2016   ID:  Johnathan Archer, DOB 03/29/1971, MRN [id number]  PCP:  Caroll Rancher, NP  Cardiologist:  Dr. Delane Ginger   Electrophysiologist:  n/a  Referring MD: Caroll Rancher, NP   Chief Complaint  Patient presents with  . Follow-up    CHF, HTN    History of Present Illness:    Johnathan Archer is a 45 y.o. male with a hx of chronic systolic CHF, nonischemic cardiomyopathy, atrial fibrillation, HTN, diabetes, sleep apnea. Patient was previously followed by Dr. Donnie Aho before establishing with Dr. Elease Hashimoto. He has run out of his medication several times in the past. Last seen by Dr. Elease Hashimoto 2/16. He was lost to FU. He was thenseen in the ED in 6/17 with AF with controlled rate. He was not taking his Amiodarone and this was resumed. I have seen him several times recently. His BP has been difficult to control. He has been non-adherent with medications in the past. However, recently, he has been good about taking his medications. His TSH was recently low. I referred him to Endocrinology for hyperthyroidism.  His Amiodarone has been continued for now unless endocrine feels it should be DC'd.  Unfortunately, he has not been able to make an appointment with endocrinology yet.  He was last seen 04/25/16.  He returns for Cardiology follow up.  He is here alone.  He is feeling good.  He denies chest pain, shortness of breath, orthopnea, PND, edema.  He has not taken any medications yet today.  He also missed Lisinopril last night.  He ran out of Amiodarone and took his last dose yesterday.  He is supposed to be on Isosorbide. But, he has not been taking this medication.    Prior CV studies that were reviewed today include:    Echo 7/14 Severe LVH, EF 20-25%, diffuse HK, restrictive physiology, moderate MR, moderate LAE, moderately reduced RVSF, mild RAE, PASP 32 mmHg, trivial pericardial effusion  LHC 6/04 Left main coronary artery is  normal. Left anterior descending normal. Circumflex coronary artery normal. Right coronary artery normal. IMPRESSION: 1. Severe diffuse hypokinesis with normal coronary arteries compatible witha cardiomyopathy. 2. Increased left ventricular end-diastolic pressure. 3. No significant coronary artery disease identified.  Past Medical History:  Diagnosis Date  . Atrial fibrillation with RVR (HCC) 10/2011   lone episode, converted with IV dilt, on coumadin given elevated CHADS2  . ED (erectile dysfunction)   . History of chicken pox   . Hypertension   . Mitral regurgitation    Moderate by echo 07/2011  . OSA (obstructive sleep apnea)   . 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  cath 2004 negative for obstruction, last echo 07/2011 EF 35%. b) Normal coronary arteries by cath in 2004 (when EF was noted at 15%). c) Last EF 20% by echo 12/2012  . Type II or unspecified type diabetes mellitus without mention of complication, uncontrolled   . Warfarin anticoagulation     Past Surgical History:  Procedure Laterality Date  . CARDIAC CATHETERIZATION  2004   no blockages  . HERNIA REPAIR  2002  . KNEE SURGERY     L knee in HS  . US ECHOCARDIOGRAPHY  07/2011   mod LVH, EF 35%, diffuse hypokinesis of entire myocardium, irreversible restrictive pattern (grade 4 diastolic dysfunction), mod MR, mod dilated LA/RA, decreased RV systolic fxn, no PFO  . US  ECHOCARDIOGRAPHY  12/2012   severe LVH, EF 20-25%, mod MR, reduced RV systolic function    Current Medications: Current Meds  Medication Sig  . amiodarone (PACERONE) 200 MG tablet Take 1 tablet (200 mg total) by mouth daily.  . benzonatate (TESSALON) 100 MG capsule Take 100 mg by mouth every 8 (eight) hours as needed for cough.  . carvedilol (COREG) 25 MG tablet Take 1 tablet (25 mg total) by mouth 2 (two) times daily.  . furosemide (LASIX) 40 MG tablet Take 1 tablet (40  mg total) by mouth 2 (two) times daily.  Marland Kitchen GLIPIZIDE PO Take 2 mg by mouth daily.  Marland Kitchen lisinopril (PRINIVIL,ZESTRIL) 20 MG tablet Take 1 tablet (20 mg total) by mouth 2 (two) times daily.  . metFORMIN (GLUCOPHAGE) 500 MG tablet Take 1 tablet (500 mg total) by mouth 2 (two) times daily with a meal.  . rivaroxaban (XARELTO) 20 MG TABS tablet Take 1 tablet (20 mg total) by mouth daily with supper.  Marland Kitchen spironolactone (ALDACTONE) 25 MG tablet Take 1 tablet (25 mg total) by mouth daily.  . [DISCONTINUED] amiodarone (PACERONE) 200 MG tablet Take 1 tablet (200 mg total) by mouth daily.  . [DISCONTINUED] hydrALAZINE (APRESOLINE) 50 MG tablet Take 1 tablet (50 mg total) by mouth 2 (two) times daily.  . [DISCONTINUED] isosorbide mononitrate (IMDUR) 30 MG 24 hr tablet Take 0.5 tablets (15 mg total) by mouth daily. Please call and schedule an appointment     Allergies:   Patient has no known allergies.   Social History   Social History  . Marital status: Married    Spouse name: [name]  . Number of children: [name]  . Years of education: [name]   Social History Main Topics  . Smoking status: Never Smoker  . Smokeless tobacco: Never Used  . Alcohol use No     Comment: Occasional 1x/mo  . Drug use: No  . Sexual activity: Not Asked   Other Topics Concern  . None   Social History Narrative   Caffeine: none   Lives with wife and daughter (8yo), no pets   Occupation: Naval architect   Edu: 52yr college   Activity: works, no regular activity   Diet: water daily, tries fruits/vegetables daily     Family History:  The patient's family history includes Diabetes in his mother; Hypertension in his father.   ROS:   Please see the history of present illness.    Review of Systems  Hematologic/Lymphatic: Negative for bleeding problem.  Gastrointestinal: Negative for hematochezia and melena.  Genitourinary: Negative for hematuria.   All other systems reviewed and are negative.   EKGs/Labs/Other Test  Reviewed:    EKG:  EKG is not ordered today.  The ekg ordered today demonstrates n/a  Recent Labs: 12/14/2015: Magnesium 1.9 03/15/2016: ALT 43; Hemoglobin 11.2; Platelets 268; TSH 0.01 04/25/2016: Brain Natriuretic Peptide 439.2 05/18/2016: BUN 22; Creat 1.20; Potassium 4.2; Sodium 139   Recent Lipid Panel    Component Value Date/Time   CHOL 191 06/26/2014 1526   TRIG 223.0 (H) 06/26/2014 1526   HDL 40.30 06/26/2014 1526   CHOLHDL 5 06/26/2014 1526   VLDL 44.6 (H) 06/26/2014 1526   LDLCALC 85 05/12/2014 1624   LDLDIRECT 111.5 06/26/2014 1526     Physical Exam:    VS:  BP (!) 170/100   Pulse 71   Ht 5\' 10"  (1.778 m)   Wt 197 lb 1.9 oz (89.4 kg)   BMI 28.28 kg/m  Wt Readings from Last 3 Encounters:  05/25/16 197 lb 1.9 oz (89.4 kg)  04/25/16 212 lb 12.8 oz (96.5 kg)  03/14/16 217 lb 6.4 oz (98.6 kg)     Physical Exam  Constitutional: He is oriented to person, place, and time. He appears well-developed and well-nourished. No distress.  HENT:  Head: Normocephalic and atraumatic.  Eyes: No scleral icterus.  Neck: No JVD present.  Cardiovascular: Normal rate, regular rhythm and normal heart sounds.   No murmur heard. Pulmonary/Chest: Effort normal. He has no wheezes. He has no rales.  Abdominal: Soft. He exhibits no distension. There is no tenderness.  Musculoskeletal: He exhibits no edema.  Neurological: He is alert and oriented to person, place, and time.  Skin: Skin is warm and dry.  Psychiatric: He has a normal mood and affect.    ASSESSMENT:    1. Hypertensive heart disease with heart failure (HCC)   2. Chronic combined systolic and diastolic CHF (congestive heart failure) (HCC)   3. Nonischemic cardiomyopathy (HCC)   4. Paroxysmal atrial fibrillation (HCC)   5. Hyperthyroidism    PLAN:    In order of problems listed above:  1. HTN - BP remains out of control.  Adherence with medications continues to an obstacle to getting his BP controlled.  I  explained to him that it is important to take his medications in the morning (he usually waits until 12 pm to take his AM medications).  He is also off of the Isosorbide.  He missed a dose of Lisinopril last night.    -  Restart Imdur 30 mg QD (he does not take PDE-5 inhibitors).  -  Change timing of meds to take in AM  -  Increase Hydralazine to 50 mg TID.  2. Chronic combined systolic and diastolic CHF - Volume is stable.  He is NYHA 2.   - Continue beta blocker, ACE, aldo antagonist, hydralazine.  Resume nitrates as noted.  -  [facility] which will also help get his BP down.  3. NICM - EF 20-25% at last Echo in 2014. Suspect related to uncontrolled BP. Would plan on repeat Echo once BP is better controlled.  4. PAF - He remainson Amiodarone and Xarelto. CHADS2-VASc=3.    5. Hyperthyroidism - I again explained to him the importance of getting his hyperthyroidism addressed.  We called Dr. George HughEllison's office while he was here and he can see him this Friday.  I d/w Dr. Elease HashimotoNahser today regarding his Amiodarone.  If he fails to see Endocrine this week, we will have to DC his Amiodarone.   Medication Adjustments/Labs and Tests Ordered: Current medicines are reviewed at length with the patient today.  Concerns regarding medicines are outlined above.  Medication changes, Labs and Tests ordered today are outlined in the Patient Instructions noted below. Patient Instructions  Medication Instructions:  1. START IMDUR 30 MG DAILY; NEW RX HAS BEEN SENT IN  2. INCREASE HYDRALAZINE TO 50 MG THREE TIMES A DAY; NEW RX HAS BEEN SENT IN 3. A REFILL FOR AMIODARONE WAS SENT IN TODAY Labwork: NONE  Testing/Procedures: NONE  Follow-Up: 1. 05/27/16 @ 9:15 WITH DR. Everardo AllELLISON AT [address], [place], [place] [address]  Phone: [phone]  2. [facility], [facility] 06/27/16 @ 9:15   Any Other Special Instructions Will Be Listed Below (If  Applicable).  If you need a refill on your cardiac medications before your next appointment, please call your  pharmacy.  Signed, Tereso Newcomer, PA-C  05/25/2016 4:04 PM    [facility] [address] [place], [place], [place]  [address] Phone: [phone]; Fax: [phone]

## 2016-05-26 ENCOUNTER — Ambulatory Visit: Payer: BLUE CROSS/BLUE SHIELD | Admitting: Cardiovascular Disease

## 2016-05-27 ENCOUNTER — Ambulatory Visit (INDEPENDENT_AMBULATORY_CARE_PROVIDER_SITE_OTHER): Payer: BLUE CROSS/BLUE SHIELD | Admitting: Endocrinology

## 2016-05-27 ENCOUNTER — Encounter: Payer: Self-pay | Admitting: Endocrinology

## 2016-05-27 ENCOUNTER — Telehealth: Payer: Self-pay | Admitting: Physician Assistant

## 2016-05-27 VITALS — BP 136/88 | HR 66 | Ht 70.0 in | Wt 200.0 lb

## 2016-05-27 DIAGNOSIS — E0590 Thyrotoxicosis, unspecified without thyrotoxic crisis or storm: Secondary | ICD-10-CM | POA: Diagnosis not present

## 2016-05-27 MED ORDER — METHIMAZOLE 10 MG PO TABS: 10.0000 mg | ORAL_TABLET | Freq: Two times a day (BID) | ORAL | 11 refills | Status: DC

## 2016-05-27 NOTE — Telephone Encounter (Signed)
I would like him to start Entresto 49/51 mg Twice daily. Please make sure he gets the Rx card. He previously stopped this medication b/c of cost. Make sure he knows that if the cost is too great to just go back to taking Lisinopril.  He will need to stop Lisinopril. Start Entresto 36 hours after his last dose of Lisinopril. Arrange a BMET 1-2 weeks after starting Entresto.  Tereso Newcomer, PA-C   05/27/2016 12:52 PM

## 2016-05-27 NOTE — Patient Instructions (Addendum)
I have sent a prescription to your pharmacy, to slow the thyroid.  You can take this indefinitely if necessary. If ever you have fever while taking methimazole, stop it and call us, even if the reason is obvious, because of the risk of a rare side-effect.   You don't need to stop the amiodarone.  We'll work around this.  Please come back for a follow-up appointment in 1 month.

## 2016-05-27 NOTE — Telephone Encounter (Signed)
Lmtcb to go over that Johnathan Archer has been approved. Will need to go over with the pt that he will need to stop the Lisinopril and 36 hours after stopping Lisinopril he will start the Entresto 49/51 mg twice daily. I will place a free 30 day card and co-pay card at the front desk for pt to pick up. I will also send in an Rx once I s/w the pt.

## 2016-05-27 NOTE — Progress Notes (Signed)
Subjective:    Patient ID: Johnathan Archer, male    DOB: October 14, 1970, 45 y.o.   MRN: [id number]  HPI Pt is referred by Tereso NewcomerScott Weaver, PA,  for hyperthyroidism.  Pt was found to have suppressed TSH in Sept of 2017 (it was normal in 2016).  He has been on amiodarone since 2015.  He has never been on thyroid therapy.  He has never had XRT to the anterior neck, or thyroid surgery.  He has never had thyroid imaging.  He does not consume kelp or any other prescribed or non-prescribed thyroid medication.  He has slight palpitations in the chest, and assoc weight loss.  Past Medical History:  Diagnosis Date  . Atrial fibrillation with RVR (HCC) 10/2011   lone episode, converted with IV dilt, on coumadin given elevated CHADS2  . ED (erectile dysfunction)   . History of chicken pox   . Hypertension   . Mitral regurgitation    Moderate by echo 07/2011  . OSA (obstructive sleep apnea)   . 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  cath 2004 negative for obstruction, last echo 07/2011 EF 35%. b) Normal coronary arteries by cath in 2004 (when EF was noted at 15%). c) Last EF 20% by echo 12/2012  . Type II or unspecified type diabetes mellitus without mention of complication, uncontrolled   . Warfarin anticoagulation     Past Surgical History:  Procedure Laterality Date  . CARDIAC CATHETERIZATION  2004   no blockages  . HERNIA REPAIR  2002  . KNEE SURGERY     L knee in HS  . US ECHOCARDIOGRAPHY  07/2011   mod LVH, EF 35%, diffuse hypokinesis of entire myocardium, irreversible restrictive pattern (grade 4 diastolic dysfunction), mod MR, mod dilated LA/RA, decreased RV systolic fxn, no PFO  . US ECHOCARDIOGRAPHY  12/2012   severe LVH, EF 20-25%, mod MR, reduced RV systolic function    Social History   Social History  . Marital status: Married    Spouse name: [name]  . Number of children: [name]  . Years of education: [name]     Occupational History  . Not on file.   Social History Main Topics  . Smoking status: Never Smoker  . Smokeless tobacco: Never Used  . Alcohol use No     Comment: Occasional 1x/mo  . Drug use: No  . Sexual activity: Not on file   Other Topics Concern  . Not on file   Social History Narrative   Caffeine: none   Lives with wife and daughter (8yo), no pets   Occupation: Naval architectestaurant Manager   Edu: 4747yr college   Activity: works, no regular activity   Diet: water daily, tries fruits/vegetables daily    Current Outpatient Prescriptions on File Prior to Visit  Medication Sig Dispense Refill  . amiodarone (PACERONE) 200 MG tablet Take 1 tablet (200 mg total) by mouth daily. 90 tablet 3  . carvedilol (COREG) 25 MG tablet Take 1 tablet (25 mg total) by mouth 2 (two) times daily. 180 tablet 3  . furosemide (LASIX) 40 MG tablet Take 1 tablet (40 mg total) by mouth 2 (two) times daily. 180 tablet 3  . GLIPIZIDE PO Take 2 mg by mouth daily.    . hydrALAZINE (APRESOLINE) 50 MG tablet Take 1 tablet (50 mg total) by mouth 3 (three) times daily. 270 tablet 3  . isosorbide mononitrate (IMDUR) 30 MG 24  hr tablet Take 1 tablet (30 mg total) by mouth daily. 90 tablet 3  . lisinopril (PRINIVIL,ZESTRIL) 20 MG tablet Take 1 tablet (20 mg total) by mouth 2 (two) times daily. 180 tablet 3  . metFORMIN (GLUCOPHAGE) 500 MG tablet Take 1 tablet (500 mg total) by mouth 2 (two) times daily with a meal. 60 tablet 0  . rivaroxaban (XARELTO) 20 MG TABS tablet Take 1 tablet (20 mg total) by mouth daily with supper. 30 tablet 11  . spironolactone (ALDACTONE) 25 MG tablet Take 1 tablet (25 mg total) by mouth daily. 90 tablet 3  . benzonatate (TESSALON) 100 MG capsule Take 100 mg by mouth every 8 (eight) hours as needed for cough.     No current facility-administered medications on file prior to visit.     No Known Allergies  Family History  Problem Relation Age of Onset  . Diabetes Mother   . Hypertension  Father   . Stroke Neg Hx   . Coronary artery disease Neg Hx   . Cancer Neg Hx   . Heart attack Neg Hx   . Thyroid disease Neg Hx     BP 136/88   Pulse 66   Ht 5\' 10"  (1.778 m)   Wt 200 lb (90.7 kg)   SpO2 97%   BMI 28.70 kg/m    Review of Systems denies fever, headache, hoarseness, visual loss, sob, polyuria, excessive diaphoresis, tremor, anxiety, heat intolerance, easy bruising, and rhinorrhea.  He has leg swelling, muscle weakness, and intermittent diarrhea.      Objective:   Physical Exam VS: see vs page GEN: no distress HEAD: head: no deformity eyes: no periorbital swelling, no proptosis external nose and ears are normal mouth: no lesion seen NECK: supple, thyroid is not enlarged CHEST WALL: no deformity LUNGS: clear to auscultation CV: reg rate and rhythm, no murmur ABD: abdomen is soft, nontender.  no hepatosplenomegaly.  not distended.  Self-reducing ventral hernia MUSCULOSKELETAL: muscle bulk and strength are grossly normal.  no obvious joint swelling.  gait is normal and steady EXTEMITIES: no deformity.  no edema PULSES: no carotid bruit NEURO:  cn 2-12 grossly intact.   readily moves all 4's.  sensation is intact to touch on all 4's.  Slight tremor of the hands.  SKIN:  Normal texture and temperature.  Not diaphoretic.  No rash or suspicious lesion is visible.   NODES:  None palpable at the neck PSYCH: alert, well-oriented.  Does not appear anxious nor depressed.  I have reviewed outside records, and summarized: Pt was having surveillance TFT for amiodarone rx.  He has then noted to have suppressed TSH, and ref here.   Lab Results  Component Value Date   TSH 0.01 (L) 03/15/2016      Assessment & Plan:  Hyperthyroidism, new, usually due to a combination of small multinodular goiter and amiodarone AF: in the setting of amiodarone, he is not a candidate for RAI.  Patient is advised the following: Patient Instructions  I have sent a prescription to your  pharmacy, to slow the thyroid.  You can take this indefinitely if necessary. If ever you have fever while taking methimazole, stop it and call us, even if the reason is obvious, because of the risk of a rare side-effect.   You don't need to stop the amiodarone.  We'll work around this.  Please come back for a follow-up appointment in 1 month.

## 2016-05-27 NOTE — Telephone Encounter (Signed)
-----   Message from Tarri Fuller, CMA sent at 05/27/2016 12:14 PM EST ----- Regarding: FW: ENTRESTO APPROVAL ? Bernestine Amass said she got the 24/26 mg approved; She did not see that there would be a problem getting the 49/51 mg approved and that we can go ahead and send in an Rx.  Thank you Okey Regal  ----- Message ----- From: Judy Pimple, LPN Sent: 81/06/8865   3:50 PM To: Tarri Fuller, CMA Subject: RE: Sherryll Burger APPROVAL ?                        Hi Miss Deanna Artis got this approved by [facility]. Now I assume he would start with the 24-26mg  dose. If not, I would need to know. Don't know what his co-pay would be, but we have "cards"  Bonita Quin ----- Message ----- From: Tarri Fuller, CMA Sent: 05/25/2016  10:33 AM To: Beatrice Lecher, PA-C, Tarri Fuller, CMA, # Subject: Sherryll Burger APPROVAL ?                            Hi Bernestine Amass W. PA wanted to see if you would be able to look into to see if this pt would be approved for Entresto 49/51 mg. I thank you for your help in this matter.  Thank you Okey Regal

## 2016-05-27 NOTE — Telephone Encounter (Signed)
Ok thanks I think Billie at [facility] has him scheduled for an appointment on 05/26/2016.

## 2016-05-30 NOTE — Telephone Encounter (Signed)
Lmtcb to advise pt that [facility] approved . Pt needs to call back so that I may go over as to when he will start the Entresto 49/51 mg BID. Pt will need to stop the Lisinopril; he will then take his first dose of Entresto 36 hours after his last dose of Lisinopril.

## 2016-06-07 NOTE — Telephone Encounter (Signed)
Lmtcb x 3 to s/w pt in regards to that we got him approved for Entresto 49/51 mg BID. I have asked for ptcb to go over instructions involving the new medication and as to stopping the Lisinopril.

## 2016-06-27 ENCOUNTER — Ambulatory Visit: Payer: BLUE CROSS/BLUE SHIELD | Admitting: Physician Assistant

## 2016-06-27 NOTE — Progress Notes (Deleted)
Cardiology Office Note:    Date:  06/27/2016   ID:  [name], DOB [birth year], MRN [id number]  PCP:  Caroll Rancher, NP  Cardiologist:  Dr. Delane Ginger   Electrophysiologist:  n/a  Referring MD: Caroll Rancher, NP   No chief complaint on file. ***  History of Present Illness:    Johnathan Archer is a 46 y.o. male with a hx of chronic systolic CHF, nonischemic cardiomyopathy, atrial fibrillation, HTN, diabetes, sleep apnea. He has had issues with medication adherence in the past.  He was seen in the ED in 6/17 with recurrent AF with controlled rate.  He was off of his Amiodarone and this was resumed.  I have seen him several times since then.  His BP remains difficult to control.  FU labs demonstrated a low TSH and he was referred to endocrinology.  Last seen by me 05/25/16.    He was seen by Dr. Everardo All 05/27/16.  He was able to continue Amiodarone and was placed on Methimazole for his Hyperthyroidism.  He returns for Cardiology follow up.  ***  Prior CV studies that were reviewed today include:    Echo 7/14 Severe LVH, EF 20-25%, diffuse HK, restrictive physiology, moderate MR, moderate LAE, moderately reduced RVSF, mild RAE, PASP 32 mmHg, trivial pericardial effusion  LHC 6/04 Left main coronary artery is normal. Left anterior descending normal. Circumflex coronary artery normal. Right coronary artery normal. IMPRESSION: 1. Severe diffuse hypokinesis with normal coronary arteries compatible witha cardiomyopathy. 2. Increased left ventricular end-diastolic pressure. 3. No significant coronary artery disease identified.  Past Medical History:  Diagnosis Date  . Atrial fibrillation with RVR (HCC) 10/2011   lone episode, converted with IV dilt, on coumadin given elevated CHADS2  . ED (erectile dysfunction)   . History of chicken pox   . Hypertension   . Mitral regurgitation    Moderate by echo 07/2011  . OSA (obstructive sleep apnea)   . 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  cath 2004 negative for obstruction, last echo 07/2011 EF 35%. b) Normal coronary arteries by cath in 2004 (when EF was noted at 15%). c) Last EF 20% by echo 12/2012  . Type II or unspecified type diabetes mellitus without mention of complication, uncontrolled   . Warfarin anticoagulation     Past Surgical History:  Procedure Laterality Date  . CARDIAC CATHETERIZATION  2004   no blockages  . HERNIA REPAIR  2002  . KNEE SURGERY     L knee in HS  . US ECHOCARDIOGRAPHY  07/2011   mod LVH, EF 35%, diffuse hypokinesis of entire myocardium, irreversible restrictive pattern (grade 4 diastolic dysfunction), mod MR, mod dilated LA/RA, decreased RV systolic fxn, no PFO  . US ECHOCARDIOGRAPHY  12/2012   severe LVH, EF 20-25%, mod MR, reduced RV systolic function    Current Medications: No outpatient prescriptions have been marked as taking for the 06/27/16 encounter (Appointment) with Beatrice Lecher, PA-C.     Allergies:   Patient has no known allergies.   Social History   Social History  . Marital status: Married    Spouse name: [name]  . Number of children: [name]  . Years of education: [name]   Social History Main Topics  . Smoking status: Never Smoker  . Smokeless tobacco: Never Used  . Alcohol use No     Comment: Occasional 1x/mo  . Drug use: No  .  Sexual activity: Not on file   Other Topics Concern  . Not on file   Social History Narrative   Caffeine: none   Lives with wife and daughter (8yo), no pets   Occupation: Naval architect   Edu: 73yr college   Activity: works, no regular activity   Diet: water daily, tries fruits/vegetables daily     Family History:  The patient's ***family history includes Diabetes in his mother; Hypertension in his father.   ROS:   Please see the history of present illness.    ROS All other systems reviewed and are negative.   EKGs/Labs/Other  Test Reviewed:    EKG:  EKG is *** ordered today.  The ekg ordered today demonstrates ***  Recent Labs: 12/14/2015: Magnesium 1.9 03/15/2016: ALT 43; Hemoglobin 11.2; Platelets 268; TSH 0.01 04/25/2016: Brain Natriuretic Peptide 439.2 05/18/2016: BUN 22; Creat 1.20; Potassium 4.2; Sodium 139   Recent Lipid Panel    Component Value Date/Time   CHOL 191 06/26/2014 1526   TRIG 223.0 (H) 06/26/2014 1526   HDL 40.30 06/26/2014 1526   CHOLHDL 5 06/26/2014 1526   VLDL 44.6 (H) 06/26/2014 1526   LDLCALC 85 05/12/2014 1624   LDLDIRECT 111.5 06/26/2014 1526     Physical Exam:    VS:  There were no vitals taken for this visit.    Wt Readings from Last 3 Encounters:  05/27/16 200 lb (90.7 kg)  05/25/16 197 lb 1.9 oz (89.4 kg)  04/25/16 212 lb 12.8 oz (96.5 kg)     ***Physical Exam  ASSESSMENT:    No diagnosis found. PLAN:    In order of problems listed above:  1. HTN - ***  2. Chronic combined systolic and diastolic CHF - ***  3. NICM - EF 20-25% at last Echo in 2014.  LHC in 2004 with normal coronary arteries.  Suspect NICM related to uncontrolled HTN.  ***Echo***  4. PAF -  ***CHADS2-VASc=3.  ***  5. Hyperthyroidism - FU with Dr. Everardo All as planned.  ***  Medication Adjustments/Labs and Tests Ordered: Current medicines are reviewed at length with the patient today.  Concerns regarding medicines are outlined above.  Medication changes, Labs and Tests ordered today are outlined in the Patient Instructions noted below. There are no Patient Instructions on file for this visit. Signed, Tereso Newcomer, PA-C  06/27/2016 7:58 AM    [facility] [address] [place], [place], [place]  [address] Phone: [phone]; Fax: [phone]

## 2016-06-27 NOTE — Telephone Encounter (Signed)
Pt had appt today to see Tereso Newcomer, PA, though he did not show for his appt.

## 2016-10-25 ENCOUNTER — Other Ambulatory Visit: Payer: Self-pay | Admitting: Cardiovascular Disease

## 2016-11-16 ENCOUNTER — Other Ambulatory Visit: Payer: Self-pay | Admitting: Cardiovascular Disease

## 2016-11-16 DIAGNOSIS — I420 Dilated cardiomyopathy: Secondary | ICD-10-CM

## 2016-11-23 ENCOUNTER — Other Ambulatory Visit: Payer: Self-pay | Admitting: Cardiovascular Disease

## 2016-11-23 DIAGNOSIS — I420 Dilated cardiomyopathy: Secondary | ICD-10-CM

## 2016-11-23 NOTE — Telephone Encounter (Signed)
Medication Detail    Disp Refills Start End   lisinopril (PRINIVIL,ZESTRIL) 20 MG tablet 180 tablet 3 05/24/2016    Sig - Route: Take 1 tablet (20 mg total) by mouth 2 (two) times daily. - Oral   E-Prescribing Status: Receipt confirmed by pharmacy (05/24/2016 4:03 PM EST)   Associated Diagnoses   Hypertensive heart disease with heart failure (HCC)     Chronic combined systolic and diastolic CHF (congestive heart failure) [facility])     Pharmacy   [id number] - [place], [place] - [address]

## 2017-02-08 ENCOUNTER — Encounter (HOSPITAL_COMMUNITY): Payer: Self-pay | Admitting: *Deleted

## 2017-02-08 ENCOUNTER — Emergency Department (HOSPITAL_COMMUNITY): Payer: BLUE CROSS/BLUE SHIELD

## 2017-02-08 ENCOUNTER — Emergency Department (HOSPITAL_COMMUNITY)
Admission: EM | Admit: 2017-02-08 | Discharge: 2017-02-08 | Disposition: A | Discharge status: Home / Self Care | Payer: BLUE CROSS/BLUE SHIELD | Attending: Emergency Medicine | Admitting: Emergency Medicine

## 2017-02-08 ENCOUNTER — Telehealth: Payer: Self-pay | Admitting: Cardiovascular Disease

## 2017-02-08 DIAGNOSIS — I4892 Unspecified atrial flutter: Secondary | ICD-10-CM | POA: Insufficient documentation

## 2017-02-08 DIAGNOSIS — Z9119 Patient's noncompliance with other medical treatment and regimen: Secondary | ICD-10-CM | POA: Diagnosis not present

## 2017-02-08 DIAGNOSIS — E119 Type 2 diabetes mellitus without complications: Secondary | ICD-10-CM | POA: Diagnosis not present

## 2017-02-08 DIAGNOSIS — I110 Hypertensive heart disease with heart failure: Secondary | ICD-10-CM | POA: Diagnosis not present

## 2017-02-08 DIAGNOSIS — Z7901 Long term (current) use of anticoagulants: Secondary | ICD-10-CM | POA: Diagnosis not present

## 2017-02-08 DIAGNOSIS — Z7984 Long term (current) use of oral hypoglycemic drugs: Secondary | ICD-10-CM | POA: Insufficient documentation

## 2017-02-08 DIAGNOSIS — Z79899 Other long term (current) drug therapy: Secondary | ICD-10-CM | POA: Insufficient documentation

## 2017-02-08 DIAGNOSIS — I5042 Chronic combined systolic (congestive) and diastolic (congestive) heart failure: Secondary | ICD-10-CM | POA: Insufficient documentation

## 2017-02-08 DIAGNOSIS — R002 Palpitations: Secondary | ICD-10-CM | POA: Diagnosis present

## 2017-02-08 DIAGNOSIS — Z0189 Encounter for other specified special examinations: Secondary | ICD-10-CM

## 2017-02-08 LAB — I-STAT TROPONIN, ED: TROPONIN I, POC: 0.03 ng/mL (ref 0.00–0.08)

## 2017-02-08 LAB — BASIC METABOLIC PANEL WITH GFR
Anion gap: 9 (ref 5–15)
BUN: 13 mg/dL (ref 6–20)
CO2: 26 mmol/L (ref 22–32)
Calcium: 9.1 mg/dL (ref 8.9–10.3)
Chloride: 100 mmol/L — ABNORMAL LOW (ref 101–111)
Creatinine, Ser: 1.21 mg/dL (ref 0.61–1.24)
GFR calc Af Amer: >60 mL/min
GFR calc non Af Amer: >60 mL/min
Glucose, Bld: 377 mg/dL — ABNORMAL HIGH (ref 65–99)
Potassium: 4.6 mmol/L (ref 3.5–5.1)
Sodium: 135 mmol/L (ref 135–145)

## 2017-02-08 LAB — CBC
HEMATOCRIT: 44.8 % (ref 39.0–52.0)
HEMOGLOBIN: 15.6 g/dL (ref 13.0–17.0)
MCH: 31.8 pg (ref 26.0–34.0)
MCHC: 34.8 g/dL (ref 30.0–36.0)
MCV: 91.4 fL (ref 78.0–100.0)
Platelets: 206 10*3/uL (ref 150–400)
RBC: 4.9 MIL/uL (ref 4.22–5.81)
RDW: 12.2 % (ref 11.5–15.5)
WBC: 5.2 10*3/uL (ref 4.0–10.5)

## 2017-02-08 LAB — PROTIME-INR
INR: 0.98
Prothrombin Time: 13 s (ref 11.4–15.2)

## 2017-02-08 LAB — APTT: aPTT: 28 s (ref 24–36)

## 2017-02-08 MED ORDER — PROPOFOL 10 MG/ML IV BOLUS
INTRAVENOUS | Status: AC | PRN
  Administered 2017-02-08: 40 mg via INTRAVENOUS
  Administered 2017-02-08 (×2): 20 mg via INTRAVENOUS
  Administered 2017-02-08: 40 mg via INTRAVENOUS

## 2017-02-08 MED ORDER — PROPOFOL 10 MG/ML IV BOLUS
0.5000 mg/kg | Freq: Once | INTRAVENOUS | Status: DC
  Filled 2017-02-08: qty 20

## 2017-02-08 NOTE — ED Notes (Signed)
Pt taken to xray 

## 2017-02-08 NOTE — ED Notes (Signed)
Called lab to add labs.

## 2017-02-08 NOTE — Discharge Instructions (Signed)
If you feel yourself go back into atrial fibrillation/flutter, experience any chest pain, shortness of breath, or have any concerns please return to the emergency room immediately for repeat evaluation.    Today you received medications that may make you sleepy or impair your ability to make decisions.  For the next 24 hours please do not drive, operate heavy machinery, care for a small child with out another adult present, or perform any activities that may cause harm to you or someone else if you were to fall asleep or be impaired.   Please continue taking all your medications as prescribed.

## 2017-02-08 NOTE — Telephone Encounter (Signed)
Spoke with patient who states he is in the ER. The PA who is evaluating the patient spoke up and said patient is in atrial flutter and that he is currently being evaluated. I advised patient to call back for follow-up appointment if needed. He thanked me for the call.

## 2017-02-08 NOTE — ED Provider Notes (Signed)
[facility]-EMERGENCY DEPT Provider Note   CSN: [id number] Arrival date & time: 02/08/17  0932     History   Chief Complaint Chief Complaint  Patient presents with  . Palpitations    HPI Johnathan Archer is a 46 y.o. male who presents today for evaluation of palpitations. He reports that he ran out of his amiodarone and imdur and missed 3 days worth. He reports that yesterday he refilled his medications and was able to take them this morning.  He reports that he has been compliant with his relative, has been on it for over one year and has not missed any doses. He reports that he feels occasional "zings" in the right side of his chest however is unsure if this started before or after his palpitations.  he reports that he feels like he is in A. Fib.  He denies any shortness of breath, nausea/vomiting/diaphoresis. No lightheaded or dizziness even with standing.  He denies any leg swelling, coughs, headaches, or other symptoms.   HPI  Past Medical History:  Diagnosis Date  . Atrial fibrillation with RVR (HCC) 10/2011   lone episode, converted with IV dilt, on coumadin given elevated CHADS2  . ED (erectile dysfunction)   . History of chicken pox   . Hypertension   . Mitral regurgitation    Moderate by echo 07/2011  . OSA (obstructive sleep apnea)   . 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  cath 2004 negative for obstruction, last echo 07/2011 EF 35%. b) Normal coronary arteries by cath in 2004 (when EF was noted at 15%). c) Last EF 20% by echo 12/2012  . Type II or unspecified type diabetes mellitus without mention of complication, uncontrolled   . Warfarin anticoagulation     Patient Active Problem List   Diagnosis Date Noted  . Hypertensive heart disease with heart failure (HCC) 03/28/2016  . Normocytic anemia 03/15/2016  . Hyperthyroidism 03/15/2016  . Chronic combined systolic and diastolic CHF  (congestive heart failure) (HCC)   . History of noncompliance with medical treatment 03/06/2015  . Chest pain, atypical 03/06/2015  . Obesity (BMI 30-39.9) 01/21/2014  . Long term current use of anticoagulant therapy 01/14/2013  . Nonischemic cardiomyopathy (HCC) 12/27/2012  . Paroxysmal atrial fibrillation (HCC) 10/19/2011  . ED (erectile dysfunction)   . Type II diabetes mellitus, uncontrolled (HCC)   . OSA (obstructive sleep apnea)     Past Surgical History:  Procedure Laterality Date  . CARDIAC CATHETERIZATION  2004   no blockages  . HERNIA REPAIR  2002  . KNEE SURGERY     L knee in HS  . US ECHOCARDIOGRAPHY  07/2011   mod LVH, EF 35%, diffuse hypokinesis of entire myocardium, irreversible restrictive pattern (grade 4 diastolic dysfunction), mod MR, mod dilated LA/RA, decreased RV systolic fxn, no PFO  . US ECHOCARDIOGRAPHY  12/2012   severe LVH, EF 20-25%, mod MR, reduced RV systolic function       Home Medications    Prior to Admission medications   Medication Sig Start Date End Date Taking? Authorizing Provider  amiodarone (PACERONE) 200 MG tablet Take 1 tablet (200 mg total) by mouth daily. 05/25/16  Yes Weaver, Scott T, PA-C  furosemide (LASIX) 40 MG tablet Take 1 tablet (40 mg total) by mouth 2 (two) times daily. 05/24/16  Yes Weaver, Scott T, PA-C  hydrALAZINE (APRESOLINE) 50 MG tablet Take 1 tablet (50 mg total) by mouth  3 (three) times daily. Patient taking differently: Take 50 mg by mouth 2 (two) times daily.  05/25/16 02/08/17 Yes Weaver, Scott T, PA-C  isosorbide mononitrate (IMDUR) 30 MG 24 hr tablet Take 1 tablet (30 mg total) by mouth daily. 05/25/16 02/08/17 Yes Weaver, Scott T, PA-C  metFORMIN (GLUCOPHAGE) 500 MG tablet Take 1 tablet (500 mg total) by mouth 2 (two) times daily with a meal. 10/27/15  Yes Cheri Fowler, PA-C  spironolactone (ALDACTONE) 25 MG tablet Take 1 tablet (25 mg total) by mouth daily. 03/15/16 02/08/17 Yes Weaver, Scott T, PA-C  XARELTO 20 MG TABS  tablet TAKE ONE TABLET BY MOUTH ONCE DAILY WITH SUPPER Patient taking differently: TAKE 20 mg BY MOUTH ONCE DAILY WITH SUPPER 10/25/16  Yes Nahser, Deloris Ping, MD  carvedilol (COREG) 25 MG tablet Take 1 tablet (25 mg total) by mouth 2 (two) times daily. Patient not taking: Reported on 02/08/2017 03/14/16   Tereso Newcomer T, PA-C  lisinopril (PRINIVIL,ZESTRIL) 20 MG tablet Take 1 tablet (20 mg total) by mouth 2 (two) times daily. Patient not taking: Reported on 02/08/2017 05/24/16   Tereso Newcomer T, PA-C  methimazole (TAPAZOLE) 10 MG tablet Take 1 tablet (10 mg total) by mouth 2 (two) times daily. Patient not taking: Reported on 02/08/2017 05/27/16   Romero Belling, MD    Family History Family History  Problem Relation Age of Onset  . Diabetes Mother   . Hypertension Father   . Stroke Neg Hx   . Coronary artery disease Neg Hx   . Cancer Neg Hx   . Heart attack Neg Hx   . Thyroid disease Neg Hx     Social History Social History  Substance Use Topics  . Smoking status: Never Smoker  . Smokeless tobacco: Never Used  . Alcohol use No     Comment: Occasional 1x/mo     Allergies   Patient has no known allergies.   Review of Systems Review of Systems  Constitutional: Negative for chills and fever.  HENT: Negative for ear pain and sore throat.   Eyes: Negative for pain and visual disturbance.  Respiratory: Negative for cough, chest tightness and shortness of breath.   Cardiovascular: Positive for chest pain ("occasional zingers") and palpitations. Negative for leg swelling.  Gastrointestinal: Negative for abdominal pain, nausea and vomiting.  Genitourinary: Negative for dysuria.  Musculoskeletal: Negative for arthralgias and back pain.  Skin: Negative for color change and rash.  Neurological: Negative for dizziness, seizures, syncope and light-headedness.  All other systems reviewed and are negative.    Physical Exam Updated Vital Signs BP (!) 141/83   Pulse 61   Temp 98.2 F (36.8  C) (Oral)   Resp 13   Ht 5\' 11"  (1.803 m)   Wt 97.5 kg (215 lb)   SpO2 96%   BMI 29.99 kg/m   Physical Exam  Constitutional: He appears well-developed and well-nourished. No distress.  HENT:  Head: Normocephalic and atraumatic.  Eyes: Conjunctivae are normal. No scleral icterus.  Neck: Neck supple. No JVD present.  Cardiovascular: Normal rate, intact distal pulses and normal pulses.  An irregularly irregular rhythm present. Exam reveals no gallop and no friction rub.   No murmur heard. Pulmonary/Chest: Effort normal and breath sounds normal. No stridor. No respiratory distress. He has no wheezes. He has no rales.  Abdominal: Soft. Bowel sounds are normal. He exhibits no distension. There is no tenderness.  Musculoskeletal: He exhibits no edema.  Neurological: He is alert. He exhibits normal muscle  tone. Coordination normal.  Skin: Skin is warm and dry. He is not diaphoretic. No pallor.  Psychiatric: He has a normal mood and affect. His behavior is normal.  Nursing note and vitals reviewed.    ED Treatments / Results  Labs (all labs ordered are listed, but only abnormal results are displayed) Labs Reviewed  BASIC METABOLIC PANEL - Abnormal; Notable for the following:       Result Value   Chloride 100 (*)    Glucose, Bld 377 (*)    All other components within normal limits  CBC  PROTIME-INR  APTT  I-STAT TROPONIN, ED    EKG- post cardioversion  EKG Interpretation  Date/Time:  Wednesday February 08 2017 15:24:11 EDT Ventricular Rate:  79 PR Interval:    QRS Duration: 100 QT Interval:  413 QTC Calculation: 474 R Axis:   18 Text Interpretation:  Sinus rhythm Consider left atrial enlargement Abnormal T, consider ischemia, lateral leads Confirmed by Geoffery Lyons ([id number]) on 02/08/2017 4:56:48 PM       Radiology Dg Chest 2 View  Result Date: 02/08/2017 CLINICAL DATA:  Atrial fibrillation, palpitations EXAM: CHEST  2 VIEW COMPARISON:  03/15/2016 FINDINGS: The heart  size and mediastinal contours are within normal limits. Both lungs are clear. The visualized skeletal structures are unremarkable. IMPRESSION: No active cardiopulmonary disease. Electronically Signed   By: Charlett Nose M.D.   On: 02/08/2017 10:14    Procedures .Cardioversion Date/Time: 02/08/2017 3:48 PM Performed by: Cristina Gong Authorized by: Cristina Gong   Consent:    Consent obtained:  Written and verbal   Consent given by:  Patient   Risks discussed:  Cutaneous burn, death, induced arrhythmia and pain   Alternatives discussed:  No treatment, rate-control medication, alternative treatment and referral Universal protocol:    Procedure explained and questions answered to patient or proxy's satisfaction: yes     Relevant documents present and verified: yes     Test results available and properly labeled: yes     Immediately prior to procedure a time out was called: yes     Patient identity confirmed:  Arm band and verbally with patient Pre-procedure details:    Cardioversion basis:  Elective   Rhythm:  Atrial flutter   Electrode placement:  Anterior-posterior Attempt one:    Cardioversion mode:  Synchronous   Shock (Joules):  200   Shock outcome:  Conversion to normal sinus rhythm Post-procedure details:    Patient status:  Awake   Patient tolerance of procedure:  Tolerated well, no immediate complications .Sedation Date/Time: 02/08/2017 3:49 PM Performed by: Cristina Gong Authorized by: Cristina Gong   Consent:    Consent obtained:  Verbal and written   Consent given by:  Patient   Risks discussed:  Allergic reaction, dysrhythmia, inadequate sedation, respiratory compromise necessitating ventilatory assistance and intubation, prolonged hypoxia resulting in organ damage and vomiting   Alternatives discussed:  Anxiolysis Indications:    Procedure performed:  Cardioversion Pre-sedation assessment:    Time since last food or drink:  2 hour    Neck mobility: normal     Mouth opening:  3 or more finger widths   Mallampati score:  II - soft palate, uvula, fauces visible   History of difficult intubation: no   Immediate pre-procedure details:    Reviewed: vital signs, relevant labs/tests and NPO status     Verified: bag valve mask available, emergency equipment available, intubation equipment available, IV patency confirmed, oxygen available and suction available  Procedure details (see MAR for exact dosages):    Preoxygenation:  Nasal cannula   Sedation:  Propofol   Analgesia:  None   Intra-procedure monitoring:  Blood pressure monitoring, cardiac monitor, continuous capnometry, continuous pulse oximetry, frequent LOC assessments and frequent vital sign checks   Intra-procedure events: none     Intra-procedure management:  Airway repositioning Post-procedure details:    Attendance: Constant attendance by certified staff until patient recovered     Recovery: Patient returned to pre-procedure baseline     Patient is stable for discharge or admission: yes     Patient tolerance:  Tolerated well, no immediate complications   (including critical care time)  CRITICAL CARE Performed by: Johnathan Archer Total critical care time: 32 minutes Critical care time was exclusive of separately billable procedures and treating other patients. Critical care was necessary to treat or prevent imminent or life-threatening deterioration. Critical care was time spent personally by me on the following activities: development of treatment plan with patient and/or surrogate as well as nursing, discussions with consultants, evaluation of patient's response to treatment, examination of patient, obtaining history from patient or surrogate, ordering and performing treatments and interventions, ordering and review of laboratory studies, ordering and review of radiographic studies, pulse oximetry and re-evaluation of patient's condition.  Of arrhythmia  requiring cardioversion.   Medications Ordered in ED Medications  propofol (DIPRIVAN) 10 mg/mL bolus/IV push 48.8 mg (not administered)  propofol (DIPRIVAN) 10 mg/mL bolus/IV push (20 mg Intravenous Given 02/08/17 1520)     Initial Impression / Assessment and Plan / ED Course  I have reviewed the triage vital signs and the nursing notes.  Pertinent labs & imaging results that were available during my care of the patient were reviewed by me and considered in my medical decision making (see chart for details).  Clinical Course as of Feb 08 1714  Wed Feb 08, 2017  1610 Attempted to see patient, not in room.   [EH]  1032 Reviewed vitals, saw HR listed as 47, checked patient, HR is still in high 80s-90s, irregularly irregular, suspect that that recorded heart rate was in error.   [EH]  1356 Spoke with cardiology who agrees with cardioversion.   [EH]  1405 Attempted to consent patient, he was on conference call and asked me to come back in 10 minutes.   [EH]  1704 Patient checked, he is alert and oriented, appears back to his normal baseline.  He is still in sinus rhythm.  He was given strict return precautions and stated his understanding.   [EH]    Clinical Course User Index [EH] Cristina Gong, PA-C   [name] presents with palpitations after missing his indoor and amiodarone for approximately 3 days. Initial EKG showed that he was in a flutter. Patient has been compliant with his relative doses, reports that he has taken every dose appropriately for the past 4 weeks.  Baseline labs, chest x-ray were all obtained and reviewed.    Cardiology was consulted prior to cardioversion who agreed with the decision to cardiovert.  Cardioversion with sedation was performed after patient gave informed consent. Prior to consent he was given the option to ask questions all of which were answered to his satisfaction. He was informed of the alternatives, potential risks, and benefits.  As  a shared visit and the patient was seen by Dr. Judd Lien who also evaluated the patient and was present during procedural sedation and cardioversion.  Patient tolerated cardioversion well, went into normal  sinus rhythm after 1 synconized electrical cardioversion.  He did not appear to have any immediate complications and the procedure was uneventful.  Patient was directly observed by myself post procedure until he awoke from sedation and was monitored in the emergency room for approximately one hour post procedure on cardiac monitoring during which he remained in normal sinus rhythm.  Post procedure EKG was reviewed by Dr. Judd Lien.   Patient will be discharged home with instructions to call his cardiologist tomorrow in follow-up. I highly suspect that patients at arrhythmia today was secondary to missing 3 continuous doses of his amiodarone. The importance of not missing amiodarone doses was stressed to patient stated his understanding.  At time of discharge patient has no complaints, no chest pain/soreness, and says that he feels back to his normal self.  Patient was given strict discharge instructions and strict return precautions and states his understanding.  Patient was advised that he received medications today that will make him sleepy/drowsy and that he should not drive, operate heavy machinery or perform other dangerous tasks for at least the next 24 hours.  Patient hemodynamically stable at the time of discharge, appears Archer for discharge with appropriate outpatient follow-up.  Final Clinical Impressions(s) / ED Diagnoses   Final diagnoses:  Atrial flutter, unspecified type [facility])  Encounter for cardioversion procedure    New Prescriptions New Prescriptions   No medications on file     Norman Clay 02/08/17 Channing Mutters, MD 02/09/17 [id number]    Geoffery Lyons, MD 02/28/17 [phone]

## 2017-02-08 NOTE — ED Triage Notes (Signed)
Pt states he feels he is back in afib.  Ran out of meds x 3 days.  Symptoms began yesterday and pt was able to fill rx and take meds this am.  Denies sob, dizziness, weakness, chest pain.

## 2017-02-08 NOTE — ED Notes (Signed)
Called lab about PT INR

## 2017-02-08 NOTE — Telephone Encounter (Signed)
Patient calling, states that he feels like his heart is about to go into AFIB and this started yesterday. Patient states that he does not have any other symptoms.

## 2017-02-09 NOTE — Telephone Encounter (Signed)
Spoke with patient to schedule follow-up appointment s/p cardioversion yesterday in the ED. He is scheduled to see Dr. Elease Hashimoto on Tuesday 8/28. He thanked me for the call.

## 2017-02-14 ENCOUNTER — Ambulatory Visit (INDEPENDENT_AMBULATORY_CARE_PROVIDER_SITE_OTHER): Payer: BLUE CROSS/BLUE SHIELD | Admitting: Cardiovascular Disease

## 2017-02-14 ENCOUNTER — Encounter: Payer: Self-pay | Admitting: Cardiovascular Disease

## 2017-02-14 VITALS — BP 182/110 | HR 79 | Ht 71.0 in | Wt 215.0 lb

## 2017-02-14 DIAGNOSIS — I10 Essential (primary) hypertension: Secondary | ICD-10-CM

## 2017-02-14 DIAGNOSIS — G4733 Obstructive sleep apnea (adult) (pediatric): Secondary | ICD-10-CM

## 2017-02-14 DIAGNOSIS — I480 Paroxysmal atrial fibrillation: Secondary | ICD-10-CM

## 2017-02-14 MED ORDER — CARVEDILOL 25 MG PO TABS: 37.5000 mg | ORAL_TABLET | Freq: Two times a day (BID) | ORAL | 3 refills | Status: DC

## 2017-02-14 NOTE — Patient Instructions (Signed)
Medication Instructions:  INCREASE Carvedilol (Coreg) to 37.5 mg twice daily   Labwork: None Ordered   Testing/Procedures: Your physician has recommended that you have a sleep study. This test records several body functions during sleep, including: brain activity, eye movement, oxygen and carbon dioxide blood levels, heart rate and rhythm, breathing rate and rhythm, the flow of air through your mouth and nose, snoring, body muscle movements, and chest and belly movement.   Follow-Up: Your physician recommends that you schedule a follow-up appointment in: 3-4 weeks with Tereso Newcomer, PA   If you need a refill on your cardiac medications before your next appointment, please call your pharmacy.   Thank you for choosing [facility]! Eligha Bridegroom, RN [phone]

## 2017-02-14 NOTE — Progress Notes (Signed)
Cardiology Office Note   Date:  02/14/2017   ID:  Johnathan Archer, DOB Oct 03, 1970, MRN [id number]  PCP:  Caroll Rancher, NP  Cardiologist:   Kristeen Miss, MD   Chief Complaint  Patient presents with  . Follow-up    atrial fib, HTN   1. Congestive heart failure, EF equals 35%, nonobstructive coronary artery disease by cath in 2004 ( former patient of Johnathan Archer) 2. Hypertension 3. Diabetes mellitus-type II 4. Sleep apnea 5. Atrial fibrillation   History of Present Illness:  Johnathan Archer to 46 year old gentleman with a history of idiopathic dilated cardiomyopathy since 2004. He had a cardiac catheterization at that time which revealed nonobstructive coronary artery disease. He's been on medical therapy since that time.  Recently he's noticed lots of fatigue and also lots of leg swelling. He does avoid eating fried foods or salty foods.  December 31, 2012:  Johnathan Archer presents with atrial fib with RVR. He was diagnosed in May 2013. He typically runs out of his meds - ran out of coreg 3 days ago. He has not been to coumadin clinic. He takes 1 coumadin a day ( 5 mg). Last INR is 1.27.   He has DOE, extreme fatigue.   He tries to avoid salt. He has been forcing down lots of water thinking that he is helping himself. He has been to the [facility].   September 13, 2013:  [name] has been doing well. He's been getting some regular exercise. He recently joined a gym. He missed his medicines yesterday. As a result on his blood pressure is fairly high today.  He still eats some canned foods. He tries to eat health food at work. 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 Most of his INR levels are subtherapeutic.  November 28, 2013:  He has run out of his meds - AGAIN. BP is high. Doing well. Some dyspnea. Sleeping OK His has A-fib - is on coumadin. Has joined a gym - losing some weight.   Has some ED.   Jan. 7, 2016:  Johnathan Archer is a 46 yo CHF, sleep anpea, afib and noncompliance. He frequently runs out of his medications. He may have been eating more salt than he should.  No chest pain. Has some discomfort/ Does not think that his heart is getting stronger.    History of Present Illness: Johnathan Archer is a 46 y.o. male who presents for follow up of HTN and CHF BP is very high today. Has been taking his meds.  No   dyspnea.  Has occasional chest discomfort -  Sometimes caused by exertion.  Last a few minutes.  Has been going on for a month  No radiation.  Not associated with dyspnea.   Aug. 28, 2018:   Johnathan Archer was in the ER with A-Fib on Aug. 22.  He had run out of his amiodarone . He was cardioverted and remains in NSR  BP is very elevated this am Ate at [employer] this am - ate sausage  Is taking his meds.   Works as a Youth worker.    Past Medical History:  Diagnosis Date  . Atrial fibrillation with RVR (HCC) 10/2011   lone episode, converted with IV dilt, on coumadin given elevated CHADS2  . ED (erectile dysfunction)   . History of chicken pox   . Hypertension   . Mitral regurgitation    Moderate by echo 07/2011  .  OSA (obstructive sleep apnea)   . 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  cath 2004 negative for obstruction, last echo 07/2011 EF 35%. b) Normal coronary arteries by cath in 2004 (when EF was noted at 15%). c) Last EF 20% by echo 12/2012  . Type II or unspecified type diabetes mellitus without mention of complication, uncontrolled   . Warfarin anticoagulation     Past Surgical History:  Procedure Laterality Date  . CARDIAC CATHETERIZATION  2004   no blockages  . HERNIA REPAIR  2002  . KNEE SURGERY     L knee in HS  . US ECHOCARDIOGRAPHY  07/2011   mod LVH, EF 35%, diffuse hypokinesis of entire myocardium, irreversible restrictive pattern (grade 4  diastolic dysfunction), mod MR, mod dilated LA/RA, decreased RV systolic fxn, no PFO  . US ECHOCARDIOGRAPHY  12/2012   severe LVH, EF 20-25%, mod MR, reduced RV systolic function     Current Outpatient Prescriptions  Medication Sig Dispense Refill  . amiodarone (PACERONE) 200 MG tablet Take 1 tablet (200 mg total) by mouth daily. 90 tablet 3  . carvedilol (COREG) 25 MG tablet Take 1 tablet (25 mg total) by mouth 2 (two) times daily. 180 tablet 3  . furosemide (LASIX) 40 MG tablet Take 1 tablet (40 mg total) by mouth 2 (two) times daily. 180 tablet 3  . hydrALAZINE (APRESOLINE) 50 MG tablet Take 50 mg by mouth 2 (two) times daily.    . isosorbide mononitrate (IMDUR) 30 MG 24 hr tablet Take 1 tablet (30 mg total) by mouth daily. 90 tablet 3  . lisinopril (PRINIVIL,ZESTRIL) 20 MG tablet Take 1 tablet (20 mg total) by mouth 2 (two) times daily. 180 tablet 3  . metFORMIN (GLUCOPHAGE) 500 MG tablet Take 1 tablet (500 mg total) by mouth 2 (two) times daily with a meal. 60 tablet 0  . methimazole (TAPAZOLE) 10 MG tablet Take 1 tablet (10 mg total) by mouth 2 (two) times daily. 60 tablet 11  . rivaroxaban (XARELTO) 20 MG TABS tablet Take 20 mg by mouth daily with supper.    Marland Kitchen spironolactone (ALDACTONE) 25 MG tablet Take 1 tablet (25 mg total) by mouth daily. 90 tablet 3   No current facility-administered medications for this visit.     Allergies:   Patient has no known allergies.    Social History:  The patient  reports that he has never smoked. He has never used smokeless tobacco. He reports that he does not drink alcohol or use drugs.   Family History:  The patient's family history includes Diabetes in his mother; Hypertension in his father.    ROS:  Please see the history of present illness.    Review of Systems: Constitutional:  denies fever, chills, diaphoresis, appetite change and fatigue.  HEENT: denies photophobia, eye pain, redness, hearing loss, ear pain, congestion, sore throat,  rhinorrhea, sneezing, neck pain, neck stiffness and tinnitus.  Respiratory: denies SOB, DOE, cough, chest tightness, and wheezing.  Cardiovascular: denies chest pain, palpitations and leg swelling.  Gastrointestinal: denies nausea, vomiting, abdominal pain, diarrhea, constipation, blood in stool.  Genitourinary: denies dysuria, urgency, frequency, hematuria, flank pain and difficulty urinating.  Musculoskeletal: denies  myalgias, back pain, joint swelling, arthralgias and gait problem.   Skin: denies pallor, rash and wound.  Neurological: denies dizziness, seizures, syncope, weakness, light-headedness, numbness and headaches.   Hematological: denies adenopathy, easy bruising, personal or family bleeding history.  Psychiatric/ Behavioral: denies suicidal ideation, mood changes, confusion, nervousness, sleep disturbance and agitation.       All other systems are reviewed and negative.    PHYSICAL EXAM: VS:  BP (!) 182/110   Pulse 79   Ht 5\' 11"  (1.803 m)   Wt 215 lb (97.5 kg)   SpO2 96%   BMI 29.99 kg/m  , BMI Body mass index is 29.99 kg/m. GEN: Well nourished, well developed, in no acute distress  HEENT: normal  Neck: no JVD, carotid bruits, or masses Cardiac: RRR; no murmurs, rubs, or gallops,no edema  Respiratory:  clear to auscultation bilaterally, normal work of breathing GI: soft, nontender, nondistended, + BS MS: no deformity or atrophy  Skin: warm and dry, no rash Neuro:  Strength and sensation are intact Psych: normal   EKG:  EKG is not ordered today. The ekg ordered today demonstrates    Recent Labs: 03/15/2016: ALT 43; TSH 0.01 04/25/2016: Brain Natriuretic Peptide 439.2 02/08/2017: BUN 13; Creatinine, Ser 1.21; Hemoglobin 15.6; Platelets 206; Potassium 4.6; Sodium 135    Lipid Panel    Component Value Date/Time   CHOL 191 06/26/2014 1526   TRIG 223.0 (H) 06/26/2014 1526   HDL 40.30 06/26/2014 1526   CHOLHDL 5 06/26/2014 1526   VLDL 44.6 (H) 06/26/2014  1526   LDLCALC 85 05/12/2014 1624   LDLDIRECT 111.5 06/26/2014 1526      Wt Readings from Last 3 Encounters:  02/14/17 215 lb (97.5 kg)  02/08/17 215 lb (97.5 kg)  05/27/16 200 lb (90.7 kg)      Other studies Reviewed: Additional studies/ records that were reviewed today include: . Review of the above records demonstrates:    ASSESSMENT AND PLAN:  1. Congestive heart failure, EF equals 35%,  He is noncompliant with his diet. His blood pressure remains very elevated. We will increase the carvedilol to 37.5 mg twice a day. He's he still eats lots of salty foods. I've encouraged him to avoid eating any extra salt.  2. Hypertension - his blood pressure remains markedly elevated today.   We'll increase the carvedilol to 37.5  mg twice day. Encouraged him to eat a low-salt diet.  Follow-up with Tereso Newcomer, PA in 3-4 weeks for an office visit and continue titration of his blood pressure medications.  3. Diabetes mellitus-type II  His glucose levels have been very elevated. His glucose was 377 last week in the emergency room. I've encouraged him to cut back on his carbohydrates. He needs to see his medical doctor for further advice and additional medications.  4. Sleep apnea-  we will make sure that he's wearing his CPAP mask each night. He does not think that his CPAP is working properly. We will schedule him for a sleep study. We'll refer him to Dr. Mayford Knife for further evaluation.  5. Atrial fibrillation:  Has PAF  Was cardioverted in the ER last week.    He had run out of his amiodarone    Current medicines are reviewed at length with the patient today.  The patient does not have concerns regarding medicines.  The following changes have been made:  See above  Disposition:      Signed, Kristeen Miss, MD  02/14/2017 11:41 AM    [facility] [address] [place], [place], [place]  [address] Phone: [phone]; Fax: [phone]

## 2017-02-17 ENCOUNTER — Telehealth: Payer: Self-pay | Admitting: *Deleted

## 2017-02-17 NOTE — Telephone Encounter (Signed)
Informed patient of upcoming sleep study and patient understanding was verbalized. Patient understands his sleep study is scheduled for Monday April 17 2017. Patient understands his sleep study will be done at [facility] sleep lab. Patient understands he will receive a sleep packet in a week or so. Patient understands to call if he does not receive the sleep packet in a timely manner. Patient agrees with treatment and thanked me for call.

## 2017-02-17 NOTE — Telephone Encounter (Signed)
-----   Message from Levi Aland, RN sent at 02/14/2017 12:12 PM EDT ----- Johnathan Archer,  This patient has a CPAP but he states that he feels he is not sleeping as well lately and wonders if it needs service. He has had it for 10 years. I have ordered a sleep study as a referral to Dr. Mayford Knife.  Thanks! Marcelino Duster

## 2017-02-23 ENCOUNTER — Telehealth: Payer: Self-pay | Admitting: Cardiovascular Disease

## 2017-02-23 NOTE — Telephone Encounter (Signed)
New message    Pt is calling stating his heart is back in afib. He said he doesn't have any symptoms just can tell his heart is beating irregular.

## 2017-02-23 NOTE — Telephone Encounter (Signed)
Patient calling and states that he feels like he is back in Afib. He states that he can tell that his heart is beating irregular. Patient states that he is currently at work and has not checked his BP or HR today. He states that his heart is not beating fast, it just feels irregular. Patient denies having any chest pain, SOB, palpitations, lightheadedness, or dizziness. Patient states that he does feel fatigued. Patient states is taking carvedilol 37.5 mg BID, amiodarone 200 mg QD, and is taking his Xarelto. Patient states that he has been compliant with his medications. Patient made aware that the information would be forwarded to Dr. Elease Hashimoto for review and recommendation. Patient advised to let us know if his symptoms worsen. Patient verbalized understanding.

## 2017-02-24 NOTE — Telephone Encounter (Signed)
Left message at patient's work number for patient to call back

## 2017-02-24 NOTE — Telephone Encounter (Signed)
If his HR and BP are normal and if he remains on the Xarelto, he is safe to continue meds. We can see if he can see an APP next week.

## 2017-03-09 NOTE — Progress Notes (Signed)
Cardiology Office Note:    Date:  03/10/2017   ID:  Johnathan Archer, DOB Oct 07, 1970, MRN [id number]  PCP:  Caroll Rancher, NP  Cardiologist:  Dr. Delane Ginger    Referring MD: Caroll Rancher, NP   Chief Complaint  Patient presents with  . Follow-up    CHF, BP    History of Present Illness:    Johnathan Archer is a 46 y.o. male with a hx of chronic systolic CHF, nonischemic cardiomyopathy, atrial fibrillation, HTN, diabetes, sleep apnea. Patient was previously followed by Dr. Donnie Aho before establishing with Dr. Elease Hashimoto. He has run out of his medication several times in the past.  He was seen in the ED in 6/17 with AF with controlled rate. He was not taking his Amiodarone and this was resumed.  His BP has been difficult to control. He has been non-adherent with medications in the past.  He has been referred to Endocrinology for hyperthyroidism.  We had a hard time getting him to go to the appointment but did see Dr. Everardo All once in 05/2016.  He was put on Methimazole.    Last seen by Dr. Delane Ginger 02/14/17 after a visit to the ED for recurrent AF.  He had run out of Amiodarone.  He underwent DCCV. His BP was out of control.  His beta-blocker was adjusted.  He was referred to Dr. Armanda Magic for OSA.  Mr. Seemann returns for follow up on BP/CHF.  He is here alone.  He notes some atypical chest pain on the R side from time to time.  It is not exertional and is not assoc with other symptoms.  He denies shortness of breath, orthopnea, PND or edema. He denies palpitations. He denies syncope. He does note some head congestion and thinks he has developed a URI. He denies bleeding issues. He admits to adherence with all his medications since last seen.  Prior CV studies:   The following studies were reviewed today:  Echo 7/14 Severe LVH, EF 20-25%, diffuse HK, restrictive physiology, moderate MR, moderate LAE, moderately reduced RVSF, mild RAE, PASP 32 mmHg, trivial pericardial  effusion  LHC 6/04 Left main coronary artery is normal. Left anterior descending normal. Circumflex coronary artery normal. Right coronary artery normal. IMPRESSION: 1. Severe diffuse hypokinesis with normal coronary arteries compatible witha cardiomyopathy. 2. Increased left ventricular end-diastolic pressure. 3. No significant coronary artery disease identified.  Past Medical History:  Diagnosis Date  . Atrial fibrillation with RVR (HCC) 10/2011   lone episode, converted with IV dilt, on coumadin given elevated CHADS2  . ED (erectile dysfunction)   . History of chicken pox   . Hypertension   . Mitral regurgitation    Moderate by echo 07/2011  . OSA (obstructive sleep apnea)   . 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  cath 2004 negative for obstruction, last echo 07/2011 EF 35%. b) Normal coronary arteries by cath in 2004 (when EF was noted at 15%). c) Last EF 20% by echo 12/2012  . Type II or unspecified type diabetes mellitus without mention of complication, uncontrolled   . Warfarin anticoagulation     Past Surgical History:  Procedure Laterality Date  . CARDIAC CATHETERIZATION  2004   no blockages  . HERNIA REPAIR  2002  . KNEE SURGERY     L knee in HS  . US ECHOCARDIOGRAPHY  07/2011   mod LVH, EF 35%, diffuse hypokinesis of  entire myocardium, irreversible restrictive pattern (grade 4 diastolic dysfunction), mod MR, mod dilated LA/RA, decreased RV systolic fxn, no PFO  . US ECHOCARDIOGRAPHY  12/2012   severe LVH, EF 20-25%, mod MR, reduced RV systolic function    Current Medications: Current Meds  Medication Sig  . amiodarone (PACERONE) 200 MG tablet Take 1 tablet (200 mg total) by mouth daily.  . carvedilol (COREG) 25 MG tablet Take 1.5 tablets (37.5 mg total) by mouth 2 (two) times daily.  . furosemide (LASIX) 40 MG tablet Take 1 tablet (40 mg total) by mouth 2 (two) times daily.  .  hydrALAZINE (APRESOLINE) 50 MG tablet Take 50 mg by mouth 2 (two) times daily.  . isosorbide mononitrate (IMDUR) 30 MG 24 hr tablet Take 1 tablet (30 mg total) by mouth daily.  . metFORMIN (GLUCOPHAGE) 500 MG tablet Take 1 tablet (500 mg total) by mouth 2 (two) times daily with a meal.  . rivaroxaban (XARELTO) 20 MG TABS tablet Take 20 mg by mouth daily with supper.  Marland Kitchen spironolactone (ALDACTONE) 25 MG tablet Take 1 tablet (25 mg total) by mouth daily.  . [DISCONTINUED] lisinopril (PRINIVIL,ZESTRIL) 20 MG tablet Take 20 mg by mouth daily.     Allergies:   Patient has no known allergies.   Social History   Social History  . Marital status: Married    Spouse name: [name]  . Number of children: [name]  . Years of education: [name]   Social History Main Topics  . Smoking status: Never Smoker  . Smokeless tobacco: Never Used  . Alcohol use No     Comment: Occasional 1x/mo  . Drug use: No  . Sexual activity: Not Asked   Other Topics Concern  . None   Social History Narrative   Caffeine: none   Lives with wife and daughter (8yo), no pets   Occupation: Naval architect   Edu: 28yr college   Activity: works, no regular activity   Diet: water daily, tries fruits/vegetables daily     Family Hx: The patient's family history includes Diabetes in his mother; Hypertension in his father. There is no history of Stroke, Coronary artery disease, Cancer, Heart attack, or Thyroid disease.  ROS:   Please see the history of present illness.    ROS All other systems reviewed and are negative.   EKGs/Labs/Other Test Reviewed:    EKG:  EKG is  ordered today.  The ekg ordered today demonstrates NSR, HR 61, normal axis, LVH with repolarization abnormality, QTc 481 ms, similar to prior tracings  Recent Labs: 03/15/2016: ALT 43; TSH 0.01 04/25/2016: Brain Natriuretic Peptide 439.2 02/08/2017: BUN 13; Creatinine, Ser 1.21; Hemoglobin 15.6; Platelets 206; Potassium 4.6; Sodium 135   Recent Lipid  Panel Lab Results  Component Value Date/Time   CHOL 191 06/26/2014 03:26 PM   TRIG 223.0 (H) 06/26/2014 03:26 PM   HDL 40.30 06/26/2014 03:26 PM   CHOLHDL 5 06/26/2014 03:26 PM   LDLCALC 85 05/12/2014 04:24 PM   LDLDIRECT 111.5 06/26/2014 03:26 PM    Physical Exam:    VS:  BP (!) 160/80   Pulse 60   Ht  (1.803 m)   Wt 220 lb 1.9 oz (99.8 kg)   BMI 30.70 kg/m     Wt Readings from Last 3 Encounters:  03/10/17 220 lb 1.9 oz (99.8 kg)  02/14/17 215 lb (97.5 kg)  02/08/17 215 lb (97.5 kg)     Physical Exam  Constitutional: He is oriented to person, place, and  time. He appears well-developed and well-nourished. No distress.  HENT:  Head: Normocephalic and atraumatic.  Eyes: No scleral icterus.  Neck: No JVD present.  Cardiovascular: Normal rate and regular rhythm.   No murmur heard. Pulmonary/Chest: Effort normal. He has no rales.  Abdominal: Soft. There is no tenderness.  Musculoskeletal: He exhibits no edema.  Neurological: He is alert and oriented to person, place, and time.  Skin: Skin is warm and dry.  Psychiatric: He has a normal mood and affect.    ASSESSMENT:    1. Chronic combined systolic and diastolic CHF (congestive heart failure) (HCC)   2. Hypertensive heart disease with heart failure (HCC)   3. Paroxysmal atrial fibrillation (HCC)   4. OSA (obstructive sleep apnea)   5. Hyperthyroidism    PLAN:    In order of problems listed above:  1. Chronic combined systolic and diastolic CHF (congestive heart failure) (HCC)  Nonischemic cardiomyopathy, EF 20-25 by last echo in 2014. Volume appears stable. Medical adherence has been difficult in the past. I suspect his blood pressure has been the major cause of his cardiomyopathy. Continue beta blocker, hydralazine, nitrates, ACE inhibitor, spironolactone.  2. Hypertensive heart disease with heart failure (HCC) Blood pressure is improved.  Continue current dose of carvedilol, furosemide, hydralazine,  isosorbide mononitrate, spironolactone. Increase lisinopril to 20 mg twice a day. He has difficulty affording the co-pay and would like to spread out his return visits here. I have asked him to keep an eye on his blood pressure and send some readings after 2 weeks. If his blood pressure remains above target, I will likely adjust his hydralazine to 50 mg 3 times a day.  Check BMET in 2 weeks.  3. Paroxysmal atrial fibrillation (HCC)  Maintaining normal sinus rhythm. Continue amiodarone, carvedilol, rivaroxaban. Obtain LFTs, TSH at next visit.  4. OSA (obstructive sleep apnea) Follow-up with Dr. Mayford Knife as planned.  5. Hyperthyroidism He has not followed up with endocrinology. I have encouraged him to follow-up with endocrinology or to follow-up with primary care for his hyperthyroidism. As he is on amiodarone, he will need a TSH obtained at his next visit.   Dispo:  Return in about 6 weeks (around 04/21/2017) for Close Follow Up, w/ Tereso Newcomer, PA-C.   Medication Adjustments/Labs and Tests Ordered: Current medicines are reviewed at length with the patient today.  Concerns regarding medicines are outlined above.  Tests Ordered: Orders Placed This Encounter  Procedures  . Basic Metabolic Panel (BMET)  . EKG 12-Lead   Medication Changes: Meds ordered this encounter  Medications  . lisinopril (PRINIVIL,ZESTRIL) 20 MG tablet    Sig: Take 1 tablet (20 mg total) by mouth 2 (two) times daily.    Dispense:  180 tablet    Refill:  3    Signed, Tereso Newcomer, PA-C  03/10/2017 11:03 AM    [facility] [address] [place], [place], [place]  [address] Phone: [phone]; Fax: [phone]

## 2017-03-10 ENCOUNTER — Telehealth: Payer: Self-pay | Admitting: *Deleted

## 2017-03-10 ENCOUNTER — Encounter: Payer: Self-pay | Admitting: Physician Assistant

## 2017-03-10 ENCOUNTER — Ambulatory Visit (INDEPENDENT_AMBULATORY_CARE_PROVIDER_SITE_OTHER): Payer: BLUE CROSS/BLUE SHIELD | Admitting: Physician Assistant

## 2017-03-10 VITALS — BP 160/80 | HR 60 | Ht 71.0 in | Wt 220.1 lb

## 2017-03-10 DIAGNOSIS — E0590 Thyrotoxicosis, unspecified without thyrotoxic crisis or storm: Secondary | ICD-10-CM

## 2017-03-10 DIAGNOSIS — I480 Paroxysmal atrial fibrillation: Secondary | ICD-10-CM

## 2017-03-10 DIAGNOSIS — G4733 Obstructive sleep apnea (adult) (pediatric): Secondary | ICD-10-CM

## 2017-03-10 DIAGNOSIS — I5042 Chronic combined systolic (congestive) and diastolic (congestive) heart failure: Secondary | ICD-10-CM

## 2017-03-10 DIAGNOSIS — I110 Hypertensive heart disease with heart failure: Secondary | ICD-10-CM | POA: Diagnosis not present

## 2017-03-10 MED ORDER — LISINOPRIL 20 MG PO TABS: 20.0000 mg | ORAL_TABLET | Freq: Two times a day (BID) | ORAL | 3 refills | Status: DC

## 2017-03-10 NOTE — Telephone Encounter (Signed)
-----   Message from Tarri Fuller, CMA sent at 03/10/2017 10:15 AM EDT ----- Regarding: SLEEP STUDY SOONER PER PT  Hi Nina,  This pt is set up for his sleep study I think 10/29. Pt would like to see if this could be moved up to a sooner date. Please call the pt with a new date if possible.  Thank you Okey Regal

## 2017-03-10 NOTE — Patient Instructions (Addendum)
Medication Instructions:  1. INCREASE LISINOPRIL TO 20 MG TWICE DAILY ; NEW RX HAS BEEN SENT IN  Labwork: LAB WORK TO BE DONE IN 2 WEEKS (BMET) 03/24/17 7:30-4:30  Testing/Procedures: NONE ORDERED TODAY  Follow-Up: 04/07/17 @ 9:15 WITH SCOTT WEAVER, PAC   Any Other Special Instructions Will Be Listed Below (If Applicable).  CHECK BLOOD PRESSURE OFTEN (3-5 TIMES PER WEEK) AND SEND OR CALL READINGS TO Avis Tirone, CMA OR SCOTT WEAVER, PAC OR YOU CAN BRING READINGS THE DAY YOU COME IN FOR YOUR LAB WORK    If you need a refill on your cardiac medications before your next appointment, please call your pharmacy.

## 2017-03-10 NOTE — Telephone Encounter (Signed)
Reached out to patient and he states his CPAP machine is in poor condition and he really needs a new one so he is requesting to have his sleep study date moved up if possible.

## 2017-03-11 ENCOUNTER — Encounter (HOSPITAL_COMMUNITY): Payer: Self-pay | Admitting: Emergency Medicine

## 2017-03-11 ENCOUNTER — Emergency Department (HOSPITAL_COMMUNITY)
Admission: EM | Admit: 2017-03-11 | Discharge: 2017-03-11 | Disposition: A | Discharge status: Home / Self Care | Payer: BLUE CROSS/BLUE SHIELD | Attending: Emergency Medicine | Admitting: Emergency Medicine

## 2017-03-11 ENCOUNTER — Emergency Department (HOSPITAL_COMMUNITY): Payer: BLUE CROSS/BLUE SHIELD

## 2017-03-11 DIAGNOSIS — Z7984 Long term (current) use of oral hypoglycemic drugs: Secondary | ICD-10-CM | POA: Insufficient documentation

## 2017-03-11 DIAGNOSIS — I5042 Chronic combined systolic (congestive) and diastolic (congestive) heart failure: Secondary | ICD-10-CM | POA: Diagnosis not present

## 2017-03-11 DIAGNOSIS — E119 Type 2 diabetes mellitus without complications: Secondary | ICD-10-CM | POA: Insufficient documentation

## 2017-03-11 DIAGNOSIS — Z7902 Long term (current) use of antithrombotics/antiplatelets: Secondary | ICD-10-CM | POA: Diagnosis not present

## 2017-03-11 DIAGNOSIS — Z79899 Other long term (current) drug therapy: Secondary | ICD-10-CM | POA: Insufficient documentation

## 2017-03-11 DIAGNOSIS — I483 Typical atrial flutter: Secondary | ICD-10-CM | POA: Insufficient documentation

## 2017-03-11 DIAGNOSIS — R002 Palpitations: Secondary | ICD-10-CM | POA: Diagnosis present

## 2017-03-11 DIAGNOSIS — I110 Hypertensive heart disease with heart failure: Secondary | ICD-10-CM | POA: Insufficient documentation

## 2017-03-11 DIAGNOSIS — I428 Other cardiomyopathies: Secondary | ICD-10-CM | POA: Diagnosis not present

## 2017-03-11 DIAGNOSIS — I480 Paroxysmal atrial fibrillation: Secondary | ICD-10-CM | POA: Diagnosis not present

## 2017-03-11 LAB — BASIC METABOLIC PANEL
ANION GAP: 7 (ref 5–15)
BUN: 26 mg/dL — ABNORMAL HIGH (ref 6–20)
CALCIUM: 8.6 mg/dL — AB (ref 8.9–10.3)
CO2: 26 mmol/L (ref 22–32)
CREATININE: 1.52 mg/dL — AB (ref 0.61–1.24)
Chloride: 101 mmol/L (ref 101–111)
GFR, EST NON AFRICAN AMERICAN: 53 mL/min — AB (ref >60)
Glucose, Bld: 339 mg/dL — ABNORMAL HIGH (ref 65–99)
Potassium: 3.5 mmol/L (ref 3.5–5.1)
SODIUM: 134 mmol/L — AB (ref 135–145)

## 2017-03-11 LAB — HEMOGLOBIN A1C
HEMOGLOBIN A1C: 8 % — AB (ref 4.8–5.6)
Mean Plasma Glucose: 182.9 mg/dL

## 2017-03-11 LAB — CBC
HCT: 36.4 % — ABNORMAL LOW (ref 39.0–52.0)
HEMOGLOBIN: 12.1 g/dL — AB (ref 13.0–17.0)
MCH: 31.1 pg (ref 26.0–34.0)
MCHC: 33.2 g/dL (ref 30.0–36.0)
MCV: 93.6 fL (ref 78.0–100.0)
Platelets: 212 10*3/uL (ref 150–400)
RBC: 3.89 MIL/uL — AB (ref 4.22–5.81)
RDW: 12.8 % (ref 11.5–15.5)
WBC: 5.5 10*3/uL (ref 4.0–10.5)

## 2017-03-11 LAB — I-STAT TROPONIN, ED: TROPONIN I, POC: 0.05 ng/mL (ref 0.00–0.08)

## 2017-03-11 LAB — CBG MONITORING, ED: Glucose-Capillary: 89 mg/dL (ref 65–99)

## 2017-03-11 LAB — TSH: TSH: 1.734 u[IU]/mL (ref 0.350–4.500)

## 2017-03-11 LAB — T4, FREE: Free T4: 1.46 ng/dL — ABNORMAL HIGH (ref 0.61–1.12)

## 2017-03-11 MED ORDER — SODIUM CHLORIDE 0.9 % IV BOLUS (SEPSIS)
1000.0000 mL | Freq: Once | INTRAVENOUS | Status: AC
  Administered 2017-03-11: 1000 mL via INTRAVENOUS

## 2017-03-11 MED ORDER — SODIUM CHLORIDE 0.9 % IV SOLN
INTRAVENOUS | Status: DC
  Administered 2017-03-11: 21:00:00 via INTRAVENOUS

## 2017-03-11 MED ORDER — ETOMIDATE 2 MG/ML IV SOLN
0.1500 mg/kg | Freq: Once | INTRAVENOUS | Status: AC
  Administered 2017-03-11: 7.5 mg via INTRAVENOUS
  Filled 2017-03-11: qty 10

## 2017-03-11 MED ORDER — INSULIN ASPART 100 UNIT/ML ~~LOC~~ SOLN
10.0000 [IU] | Freq: Once | SUBCUTANEOUS | Status: AC
  Administered 2017-03-11: 10 [IU] via INTRAVENOUS
  Filled 2017-03-11: qty 1

## 2017-03-11 NOTE — ED Triage Notes (Signed)
Pt states he woke up and felt that his heart was racing in afib. Denies chest pain. Pt states he feels very tired. Denies shortness of breath. Pt takes Xarelto.

## 2017-03-11 NOTE — Progress Notes (Signed)
Patient asked RN if RT had any BIPAP QHS mask and tubing he could have for home. Patient given Mask and Tubing and charged for Setup of those items.

## 2017-03-11 NOTE — Consult Note (Signed)
Reason for Consult: Atrial flutter   Requesting Physician/Service: ED Rogene [place])  PCP:  Sherilyn Cooter, NP Primary Cardiologist:Nahser  HPI: 46 YO man with NICM (20-25%-- presumably from chronic HTN), paroxysmal AF (no reported flutter history), DM, OSA, HTN.  See notes from cardiology clinic-- historically may had adherence issues with BP medications, amiodarone, etc but more adherent more recently.    Patient presents to the ED today with fatigue (his AF symptom) and noting heart rate was irregular this morning when he woke up.  He denies noting that his rate was uncontrolled.  He tried to do some yard work, but was too limited and came to the ED.  Noted to be in rate controlled flutter.    He was seen in cardiology clinic yesterday, BP was 160/80 and he was in sinus rhythm.  Lisinopril was increased to 20 mg BID. At that time he was doing well.  Before this, he was seen in the ED for afib and cardioverted and d/c'd.  He states that he thinks that he has been in sinus up until this AM.  On interview, he feels well other than fatigue from feeling his atrial arrhythmia.  No chest pain, SOB, edema, infectious symptoms, bleeding problems.  He assures me he has not missed any Xarelto doses in > 5 months.  In the past, he was seen Dr. Tiffany Kocher for hyperthyroid (presumably due to amiodarone) and was started on methimazole then, although he is no longer taking this.  No thyroid studies in 1 year, at that time TSH 0.01.  Previous Cardiac Studies: Echo 7/14 Severe LVH, EF 20-25%, diffuse HK, restrictive physiology, moderate MR, moderate LAE, moderately reduced RVSF, mild RAE, PASP 32 mmHg, trivial pericardial effusion  LHC 6/04 Left main coronary artery is normal. Left anterior descending normal. Circumflex coronary artery normal. Right coronary artery normal. IMPRESSION: 1. Severe diffuse hypokinesis with normal coronary arteries compatible witha cardiomyopathy. 2. Increased left  ventricular end-diastolic pressure. 3. No significant coronary artery disease identified.  Past Medical History:  Diagnosis Date  . Atrial fibrillation with RVR ([facility]) 10/2011   lone episode, converted with IV dilt, on coumadin given elevated CHADS2  . ED (erectile dysfunction)   . History of chicken pox   . Hypertension   . Mitral regurgitation    Moderate by echo 07/2011  . OSA (obstructive sleep apnea)   . Systolic and diastolic CHF, chronic ([facility]) 2004   a) Idiopathic dilated CM, 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  cath 2004 negative for obstruction, last echo 07/2011 EF 35%. b) Normal coronary arteries by cath in 2004 (when EF was noted at 15%). c) Last EF 20% by echo 12/2012  . Type II or unspecified type diabetes mellitus without mention of complication, uncontrolled   . Warfarin anticoagulation     Past Surgical History:  Procedure Laterality Date  . CARDIAC CATHETERIZATION  2004   no blockages  . HERNIA REPAIR  2002  . KNEE SURGERY     L knee in HS  . US ECHOCARDIOGRAPHY  07/2011   mod LVH, EF 35%, diffuse hypokinesis of entire myocardium, irreversible restrictive pattern (grade 4 diastolic dysfunction), mod MR, mod dilated LA/RA, decreased RV systolic fxn, no PFO  . US ECHOCARDIOGRAPHY  12/2012   severe LVH, EF 20-25%, mod MR, reduced RV systolic function    Family History  Problem Relation Age of Onset  . Diabetes Mother   . Hypertension Father   . Stroke Neg Hx   . Coronary  artery disease Neg Hx   . Cancer Neg Hx   . Heart attack Neg Hx   . Thyroid disease Neg Hx    Social History:  reports that he has never smoked. He has never used smokeless tobacco. He reports that he does not drink alcohol or use drugs.  Allergies: No Known Allergies  No current facility-administered medications on file prior to encounter.    Current Outpatient Prescriptions on File Prior to Encounter  Medication Sig Dispense Refill  . amiodarone (PACERONE) 200 MG tablet Take 1 tablet  (200 mg total) by mouth daily. 90 tablet 3  . carvedilol (COREG) 25 MG tablet Take 1.5 tablets (37.5 mg total) by mouth 2 (two) times daily. 270 tablet 3  . furosemide (LASIX) 40 MG tablet Take 1 tablet (40 mg total) by mouth 2 (two) times daily. 180 tablet 3  . hydrALAZINE (APRESOLINE) 50 MG tablet Take 50 mg by mouth 2 (two) times daily.    . isosorbide mononitrate (IMDUR) 30 MG 24 hr tablet Take 1 tablet (30 mg total) by mouth daily. 90 tablet 3  . lisinopril (PRINIVIL,ZESTRIL) 20 MG tablet Take 1 tablet (20 mg total) by mouth 2 (two) times daily. 180 tablet 3  . metFORMIN (GLUCOPHAGE) 500 MG tablet Take 1 tablet (500 mg total) by mouth 2 (two) times daily with a meal. 60 tablet 0  . rivaroxaban (XARELTO) 20 MG TABS tablet Take 20 mg by mouth daily with supper.    Marland Kitchen spironolactone (ALDACTONE) 25 MG tablet Take 1 tablet (25 mg total) by mouth daily. 90 tablet 3   Results for orders placed or performed during the hospital encounter of 03/11/17 (from the past 48 hour(s))  Basic metabolic panel     Status: Abnormal   Collection Time: 03/11/17  4:00 PM  Result Value Ref Range   Sodium 134 (L) 135 - 145 mmol/L   Potassium 3.5 3.5 - 5.1 mmol/L   Chloride 101 101 - 111 mmol/L   CO2 26 22 - 32 mmol/L   Glucose, Bld 339 (H) 65 - 99 mg/dL   BUN 26 (H) 6 - 20 mg/dL   Creatinine, Ser 1.52 (H) 0.61 - 1.24 mg/dL   Calcium 8.6 (L) 8.9 - 10.3 mg/dL   GFR calc non Af Amer 53 (L) >60 mL/min   GFR calc Af Amer >60 >60 mL/min    Comment: (NOTE) The eGFR has been calculated using the CKD EPI equation. This calculation has not been validated in all clinical situations. eGFR's persistently <60 mL/min signify possible Chronic Kidney Disease.    Anion gap 7 5 - 15  CBC     Status: Abnormal   Collection Time: 03/11/17  4:00 PM  Result Value Ref Range   WBC 5.5 4.0 - 10.5 K/uL   RBC 3.89 (L) 4.22 - 5.81 MIL/uL   Hemoglobin 12.1 (L) 13.0 - 17.0 g/dL   HCT 36.4 (L) 39.0 - 52.0 %   MCV 93.6 78.0 - 100.0 fL    MCH 31.1 26.0 - 34.0 pg   MCHC 33.2 30.0 - 36.0 g/dL   RDW 12.8 11.5 - 15.5 %   Platelets 212 150 - 400 K/uL  I-Stat Troponin, ED (not at [facility])     Status: None   Collection Time: 03/11/17  4:10 PM  Result Value Ref Range   Troponin i, poc 0.05 0.00 - 0.08 ng/mL   Comment 3            Comment: Due to the  release kinetics of cTnI, a negative result within the first hours of the onset of symptoms does not rule out myocardial infarction with certainty. If myocardial infarction is still suspected, repeat the test at appropriate intervals.    Dg Chest 2 View  Result Date: 03/11/2017 CLINICAL DATA:  Tachycardia since this morning which shortness-of-breath and fatigue. EXAM: CHEST  2 VIEW COMPARISON:  02/08/2017 and 03/15/2016 FINDINGS: Lungs are adequately inflated and otherwise clear. Mild cardiomegaly. Mild degenerate change of the spine. 2.2 cm linear dense structure over the right neck base unchanged. IMPRESSION: No active cardiopulmonary disease. Mild cardiomegaly. Electronically Signed   By: Marin Olp M.D.   On: 03/11/2017 16:57    ECG/TELE: Flutter rate controlled. borderline LVH   ROS: As above. Otherwise, review of systems is negative unless per above HPI  Vitals:   03/11/17 1558  BP: 120/72  Pulse: 78  Resp: 18  Temp: (!) 97.4 F (36.3 C)  TempSrc: Oral  SpO2: 99%   Wt Readings from Last 10 Encounters:  03/10/17 99.8 kg (220 lb 1.9 oz)  02/14/17 97.5 kg (215 lb)  02/08/17 97.5 kg (215 lb)  05/27/16 90.7 kg (200 lb)  05/25/16 89.4 kg (197 lb 1.9 oz)  04/25/16 96.5 kg (212 lb 12.8 oz)  03/14/16 98.6 kg (217 lb 6.4 oz)  02/29/16 96.5 kg (212 lb 12.8 oz)  10/27/15 97.5 kg (215 lb)  04/18/15 97 kg (213 lb 12.8 oz)    PE:  General: No acute distress HEENT: Atraumatic, EOMI, mucous membranes moist. No JVD at 35 degrees. No HJR. CV: iRiR 2/6 SEM, no gallops.  Respiratory: Clear, no crackles. Normal work of breathing ABD: Non-distended and non-tender. No  palpable organomegaly.  Extremities: 2+ radial pulses bilaterally. no edema. 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  Last cardioverted in the ED without complication.  Today ECG more convincing for atrial flutter, which is not recorded in his chart previously.  Drivers include OSA, cardiomyopathy, LA remodeling from MR/HTN/NICM. TSH checked today and wnl (history of hyperthyroidism).  Either way, it is likely multifactorial.    He was stable for trial of cardioversion given DOAC adherence which was successful for a short period before returning to atrial flutter.    Its unclear to me if this is a classic cavo-tricuspid isthmus or some other atypical flutter circuit.  He will likely need an outpatient EP study with possible ablation.   I will send a message to [employer]. Nahser to discuss further plans, including possible flutter ablation.  He may need labs sooner that 2 weeks to monitor his kidney function after the increase yesterday in his lisinopril.  At this point, GFR not low enough to warrant a decrease in his chronic Xarelto therapy.  He will also need to see his primary care doctor, his glucose was over 300 on presentation.  A1c added-on which was 8.  Lolita Cram Means  MD 03/11/2017, 6:59 PM

## 2017-03-11 NOTE — Discharge Instructions (Signed)
Follow-up with cardiology give them a call on Monday. Return for rapid heart rate. Do not need to return for irregular heart rate as long as the rate is not going fast. Cardiology will want to follow-up with you. Take your medications as directed.

## 2017-03-11 NOTE — ED Provider Notes (Addendum)
[facility]-EMERGENCY DEPT Provider Note   CSN: [id number] Arrival date & time: 03/11/17  1551     History   Chief Complaint Chief Complaint  Patient presents with  . Atrial Flutter    HPI Johnathan Archer is a 46 y.o. male.  Patient with a history of atrial fibrillation and atrial flutter. Patient is on amiodarone patient also on the blood thinner [name]. Patient states he's been compliant. Patient was seen by cardiology yesterday. Patient raises concern that C Pap machine may not be working properly. Cardiology referred him to pulmonary medicine for further evaluation. Patient came in today felt his heart was racing. Now he feels just like it's irregular. Denies any chest pain. Denied any shortness of breath.  In reading cardiology's notes patient has had a history of being noncompliant on his medications. But he states that he is definitely been taking them this time. And that includes his blood thinner.      Past Medical History:  Diagnosis Date  . Atrial fibrillation with RVR ([facility]) 10/2011   lone episode, converted with IV dilt, on coumadin given elevated CHADS2  . ED (erectile dysfunction)   . History of chicken pox   . Hypertension   . Mitral regurgitation    Moderate by echo 07/2011  . OSA (obstructive sleep apnea)   . 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  cath 2004 negative for obstruction, last echo 07/2011 EF 35%. b) Normal coronary arteries by cath in 2004 (when EF was noted at 15%). c) Last EF 20% by echo 12/2012  . Type II or unspecified type diabetes mellitus without mention of complication, uncontrolled   . Warfarin anticoagulation     Patient Active Problem List   Diagnosis Date Noted  . Hypertensive heart disease with heart failure ([facility]) 03/28/2016  . Normocytic anemia 03/15/2016  . Hyperthyroidism 03/15/2016  . Chronic combined systolic and diastolic CHF (congestive heart  failure) ([facility])   . History of noncompliance with medical treatment 03/06/2015  . Chest pain, atypical 03/06/2015  . Obesity (BMI 30-39.9) 01/21/2014  . Long term current use of anticoagulant therapy 01/14/2013  . Nonischemic cardiomyopathy ([facility]) 12/27/2012  . Paroxysmal atrial fibrillation ([facility]) 10/19/2011  . ED (erectile dysfunction)   . Type II diabetes mellitus, uncontrolled ([facility])   . OSA (obstructive sleep apnea)     Past Surgical History:  Procedure Laterality Date  . CARDIAC CATHETERIZATION  2004   no blockages  . HERNIA REPAIR  2002  . KNEE SURGERY     L knee in HS  . US ECHOCARDIOGRAPHY  07/2011   mod LVH, EF 35%, diffuse hypokinesis of entire myocardium, irreversible restrictive pattern (grade 4 diastolic dysfunction), mod MR, mod dilated LA/RA, decreased RV systolic fxn, no PFO  . US ECHOCARDIOGRAPHY  12/2012   severe LVH, EF 20-25%, mod MR, reduced RV systolic function       Home Medications    Prior to Admission medications   Medication Sig Start Date End Date Taking? Authorizing Provider  amiodarone (PACERONE) 200 MG tablet Take 1 tablet (200 mg total) by mouth daily. 05/25/16  Yes Weaver, Sophiea Ueda T, PA-C  carvedilol (COREG) 25 MG tablet Take 1.5 tablets (37.5 mg total) by mouth 2 (two) times daily. 02/14/17 05/15/17 Yes Nahser, Deloris Ping, MD  furosemide (LASIX) 40 MG tablet Take 1 tablet (40 mg total) by mouth 2 (two) times daily. 05/24/16  Yes Tereso Newcomer T, PA-C  hydrALAZINE (APRESOLINE) 50 MG tablet Take 50 mg by mouth 2 (two) times daily.   Yes [provider]  isosorbide mononitrate (IMDUR) 30 MG 24 hr tablet Take 1 tablet (30 mg total) by mouth daily. 05/25/16 03/11/17 Yes Weaver, Merial Moritz T, PA-C  lisinopril (PRINIVIL,ZESTRIL) 20 MG tablet Take 1 tablet (20 mg total) by mouth 2 (two) times daily. 03/10/17 06/08/17 Yes Weaver, Chuong Casebeer T, PA-C  metFORMIN (GLUCOPHAGE) 500 MG tablet Take 1 tablet (500 mg total) by mouth 2 (two) times daily with a meal. 10/27/15  Yes  Cheri Fowler, PA-C  rivaroxaban (XARELTO) 20 MG TABS tablet Take 20 mg by mouth daily with supper.   Yes [provider]  spironolactone (ALDACTONE) 25 MG tablet Take 1 tablet (25 mg total) by mouth daily. 03/15/16 03/11/17 Yes Beatrice Lecher, PA-C    Family History Family History  Problem Relation Age of Onset  . Diabetes Mother   . Hypertension Father   . Stroke Neg Hx   . Coronary artery disease Neg Hx   . Cancer Neg Hx   . Heart attack Neg Hx   . Thyroid disease Neg Hx     Social History Social History  Substance Use Topics  . Smoking status: Never Smoker  . Smokeless tobacco: Never Used  . Alcohol use No     Comment: Occasional 1x/mo     Allergies   Patient has no known allergies.   Review of Systems Review of Systems  Constitutional: Negative for fever.  HENT: Negative for congestion.   Eyes: Negative for redness.  Respiratory: Negative for shortness of breath.   Cardiovascular: Negative for chest pain and leg swelling.  Gastrointestinal: Negative for abdominal pain.  Genitourinary: Negative for dysuria.  Musculoskeletal: Negative for back pain.  Skin: Negative for rash.  Neurological: Negative for headaches.  Hematological: Does not bruise/bleed easily.  Psychiatric/Behavioral: Negative for confusion.     Physical Exam Updated Vital Signs BP 137/77   Pulse 70   Temp (!) 97.4 F (36.3 C) (Oral)   Resp 20   SpO2 98%   Physical Exam  Constitutional: He is oriented to person, place, and time. He appears well-developed and well-nourished. No distress.  HENT:  Head: Normocephalic and atraumatic.  Mouth/Throat: Oropharynx is clear and moist.  Eyes: Pupils are equal, round, and reactive to light. EOM are normal.  Neck: Normal range of motion. Neck supple.  Cardiovascular:  Normal rate irregular  Pulmonary/Chest: Effort normal and breath sounds normal. No respiratory distress.  Abdominal: Soft. Bowel sounds are normal. There is no tenderness.    Musculoskeletal: Normal range of motion. He exhibits no edema.  Neurological: He is alert and oriented to person, place, and time. No cranial nerve deficit or sensory deficit. He exhibits normal muscle tone. Coordination normal.  Skin: Skin is warm.  Nursing note and vitals reviewed.    ED Treatments / Results  Labs (all labs ordered are listed, but only abnormal results are displayed) Labs Reviewed  BASIC METABOLIC PANEL - Abnormal; Notable for the following:       Result Value   Sodium 134 (*)    Glucose, Bld 339 (*)    BUN 26 (*)    Creatinine, Ser 1.52 (*)    Calcium 8.6 (*)    GFR calc non Af Amer 53 (*)    All other components within normal limits  CBC - Abnormal; Notable for the following:    RBC 3.89 (*)    Hemoglobin 12.1 (*)  HCT 36.4 (*)    All other components within normal limits  HEMOGLOBIN A1C - Abnormal; Notable for the following:    Hgb A1c MFr Bld 8.0 (*)    All other components within normal limits  T4, FREE - Abnormal; Notable for the following:    Free T4 1.46 (*)    All other components within normal limits  TSH  T3, FREE  I-STAT TROPONIN, ED  CBG MONITORING, ED    EKG  EKG Interpretation  Date/Time:  Saturday March 11 2017 21:42:33 EDT Ventricular Rate:  65 PR Interval:    QRS Duration: 104 QT Interval:  460 QTC Calculation: 479 R Axis:   36 Text Interpretation:  Sinus rhythm Abnormal T, consider ischemia, lateral leads Baseline wander in lead(s) II III aVF V6 Confirmed by Vanetta Mulders [phone]) on 03/11/2017 10:47:47 PM       Radiology Dg Chest 2 View  Result Date: 03/11/2017 CLINICAL DATA:  Tachycardia since this morning which shortness-of-breath and fatigue. EXAM: CHEST  2 VIEW COMPARISON:  02/08/2017 and 03/15/2016 FINDINGS: Lungs are adequately inflated and otherwise clear. Mild cardiomegaly. Mild degenerate change of the spine. 2.2 cm linear dense structure over the right neck base unchanged. IMPRESSION: No active  cardiopulmonary disease. Mild cardiomegaly. Electronically Signed   By: Elberta Fortis M.D.   On: 03/11/2017 16:57    Procedures .Cardioversion Date/Time: 03/11/2017 10:44 PM Performed by: Vanetta Mulders Authorized by: Vanetta Mulders   Consent:    Consent obtained:  Written   Consent given by:  Patient   Risks discussed:  Induced arrhythmia, cutaneous burn, pain and death   Alternatives discussed:  No treatment and delayed treatment Pre-procedure details:    Cardioversion basis:  Emergent   Rhythm:  Atrial flutter Attempt one:    Cardioversion mode:  Synchronous   Waveform:  Biphasic   Shock (Joules):  120   Shock outcome:  Conversion to normal sinus rhythm Post-procedure details:    Patient status:  Unresponsive   Patient tolerance of procedure:  Tolerated well, no immediate complications     (including critical care time)  CRITICAL CARE Performed by: Jenna Ardoin Total critical care time: 30 minutes Critical care time was exclusive of separately billable procedures and treating other patients. Critical care was necessary to treat or prevent imminent or life-threatening deterioration. Critical care was time spent personally by me on the following activities: development of treatment plan with patient and/or surrogate as well as nursing, discussions with consultants, evaluation of patient's response to treatment, examination of patient, obtaining history from patient or surrogate, ordering and performing treatments and interventions, ordering and review of laboratory studies, ordering and review of radiographic studies, pulse oximetry and re-evaluation of patient's condition.      Medications Ordered in ED Medications  0.9 %  sodium chloride infusion ( Intravenous Stopped 03/11/17 2247)  sodium chloride 0.9 % bolus 1,000 mL (0 mLs Intravenous Stopped 03/11/17 1909)  insulin aspart (novoLOG) injection 10 Units (10 Units Intravenous Given 03/11/17 1734)  etomidate  (AMIDATE) injection 14.98 mg (7.5 mg Intravenous Given by Other 03/11/17 2121)     Initial Impression / Assessment and Plan / ED Course  I have reviewed the triage vital signs and the nursing notes.  Pertinent labs & imaging results that were available during my care of the patient were reviewed by me and considered in my medical decision making (see chart for details).    Patient rhythms here today consistent with atrial flutter. Like 3-1. Discussed with cardiology. Fellow  reviewed his notes. Recommending getting a thyroid-stimulating hormone which was in normal range. Then agreed to go forward with cardioversion.  Patient has been cardioverted in the past. Patient sedated with etomidate. And that synchronize cardioversion 120 J.  Patient initially following her eversion went into sinus rhythm. Stayed there for a few minutes and then went back into atrial flutter. Heart rate was around 74. Shortly after waking up from the etomidate patient converted on his own back to sinus rhythm. Cardiology fellow was made aware that he had gone back into atrial flutter. And they still felt it was fine for him to dish be discharged home with follow-up with cardiology. Patient will call them for follow-up in the morning. Patient will return for any chest pain or any rapid heart rate.  Cardiology fellow was not aware that he converted back to sinus rhythm spontaneously.  Patient tolerated procedures well without any significant abnormalities.   Final Clinical Impressions(s) / ED Diagnoses   Final diagnoses:  Typical atrial flutter [facility])    New Prescriptions New Prescriptions   No medications on file     Vanetta Mulders, MD 03/11/17 2245    Vanetta Mulders, MD 03/11/17 1610    Vanetta Mulders, MD 03/11/17 2251

## 2017-03-11 NOTE — ED Notes (Signed)
Pt advised he found a ride home.  Person should be here within the hour.  Gave pt happy meal and diet ginger ale.

## 2017-03-12 LAB — T3, FREE: T3 FREE: 2 pg/mL (ref 2.0–4.4)

## 2017-03-13 ENCOUNTER — Telehealth (HOSPITAL_COMMUNITY): Payer: Self-pay | Admitting: *Deleted

## 2017-03-13 NOTE — Telephone Encounter (Signed)
LMOM for pt regarding recent ED visit

## 2017-03-14 ENCOUNTER — Telehealth: Payer: Self-pay | Admitting: Physician Assistant

## 2017-03-14 DIAGNOSIS — I4892 Unspecified atrial flutter: Secondary | ICD-10-CM

## 2017-03-14 DIAGNOSIS — I480 Paroxysmal atrial fibrillation: Secondary | ICD-10-CM

## 2017-03-14 NOTE — Telephone Encounter (Signed)
Spoke with patient and advised that EP referral has been placed and that one of our schedulers will call to make him an appointment. He is aware of lab appointment on 10/5. I advised him to call sooner with questions or concerns. He thanked me for the call.

## 2017-03-14 NOTE — Telephone Encounter (Signed)
Patient seen in ED this past weekend by Cardiology fellow with AFlutter. DCCV was unsuccessful in the ED. Creatinine was also slightly higher and ACE inhibitor was recently adjusted. D/w Dr. Delane Ginger. PLAN:  1. Please refer to EP for AFib/Flutter. 2. Please arrange BMET later this week or early next week. Tereso Newcomer, PA-C    03/14/2017 8:10 AM

## 2017-03-17 NOTE — Telephone Encounter (Signed)
[facility] has been added to [facility] at [facility].

## 2017-03-24 ENCOUNTER — Other Ambulatory Visit: Payer: BLUE CROSS/BLUE SHIELD

## 2017-03-24 DIAGNOSIS — I5042 Chronic combined systolic (congestive) and diastolic (congestive) heart failure: Secondary | ICD-10-CM

## 2017-03-24 LAB — BASIC METABOLIC PANEL
BUN/Creatinine Ratio: 15 (ref 9–20)
BUN: 18 mg/dL (ref 6–24)
CALCIUM: 8.3 mg/dL — AB (ref 8.7–10.2)
CO2: 23 mmol/L (ref 20–29)
CREATININE: 1.23 mg/dL (ref 0.76–1.27)
Chloride: 101 mmol/L (ref 96–106)
GFR calc Af Amer: 81 mL/min/{1.73_m2} (ref >59)
GFR calc non Af Amer: 70 mL/min/{1.73_m2} (ref >59)
GLUCOSE: 223 mg/dL — AB (ref 65–99)
Potassium: 3.6 mmol/L (ref 3.5–5.2)
Sodium: 138 mmol/L (ref 134–144)

## 2017-03-27 ENCOUNTER — Other Ambulatory Visit: Payer: Self-pay | Admitting: Physician Assistant

## 2017-03-27 DIAGNOSIS — I5042 Chronic combined systolic (congestive) and diastolic (congestive) heart failure: Secondary | ICD-10-CM

## 2017-03-28 ENCOUNTER — Institutional Professional Consult (permissible substitution): Payer: BLUE CROSS/BLUE SHIELD | Admitting: Cardiology

## 2017-03-28 NOTE — Progress Notes (Deleted)
Electrophysiology Office Note   Date:  03/28/2017   ID:  CEBERT PERLICK, DOB 1970/11/08, MRN [id number]  PCP:  Caroll Rancher, NP  Cardiologist:  Nahser Primary Electrophysiologist:  Karter Hellmer Jorja Loa, MD    No chief complaint on file.    History of Present Illness: Johnathan Archer is a 46 y.o. male who is being seen today for the evaluation of atrial fib/flutter at the request of Caroll Rancher, NP. Presenting today for electrophysiology evaluation. He has a history of chronic systolic heart failure, nonischemic cardio myopathy, atrial fibrillation, hypertension, diabetes, and sleep apnea. He also has a history of medication noncompliance. He takes amiodarone for his atrial fibrillation. He was in the emergency room 02/14/17 for recurrent atrial fibrillation. He had run out of amiodarone. He underwent cardioversion. He was subtotally seen in the emergency room 03/11/17 for atrial flutter. He had a cardioversion emergency room.    Today, he denies*** symptoms of palpitations, chest pain, shortness of breath, orthopnea, PND, lower extremity edema, claudication, dizziness, presyncope, syncope, bleeding, or neurologic sequela. The patient is tolerating medications without difficulties.    Past Medical History:  Diagnosis Date  . Atrial fibrillation with RVR (HCC) 10/2011   lone episode, converted with IV dilt, on coumadin given elevated CHADS2  . ED (erectile dysfunction)   . History of chicken pox   . Hypertension   . Mitral regurgitation    Moderate by echo 07/2011  . OSA (obstructive sleep apnea)   . 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  cath 2004 negative for obstruction, last echo 07/2011 EF 35%. b) Normal coronary arteries by cath in 2004 (when EF was noted at 15%). c) Last EF 20% by echo 12/2012  . Type II or unspecified type diabetes mellitus without mention of complication,  uncontrolled   . Warfarin anticoagulation    Past Surgical History:  Procedure Laterality Date  . CARDIAC CATHETERIZATION  2004   no blockages  . HERNIA REPAIR  2002  . KNEE SURGERY     L knee in HS  . US ECHOCARDIOGRAPHY  07/2011   mod LVH, EF 35%, diffuse hypokinesis of entire myocardium, irreversible restrictive pattern (grade 4 diastolic dysfunction), mod MR, mod dilated LA/RA, decreased RV systolic fxn, no PFO  . US ECHOCARDIOGRAPHY  12/2012   severe LVH, EF 20-25%, mod MR, reduced RV systolic function     Current Outpatient Prescriptions  Medication Sig Dispense Refill  . amiodarone (PACERONE) 200 MG tablet Take 1 tablet (200 mg total) by mouth daily. 90 tablet 3  . carvedilol (COREG) 25 MG tablet Take 1.5 tablets (37.5 mg total) by mouth 2 (two) times daily. 270 tablet 3  . furosemide (LASIX) 40 MG tablet Take 1 tablet (40 mg total) by mouth 2 (two) times daily. 180 tablet 3  . hydrALAZINE (APRESOLINE) 50 MG tablet Take 50 mg by mouth 2 (two) times daily.    . isosorbide mononitrate (IMDUR) 30 MG 24 hr tablet Take 1 tablet (30 mg total) by mouth daily. 90 tablet 3  . lisinopril (PRINIVIL,ZESTRIL) 20 MG tablet Take 1 tablet (20 mg total) by mouth 2 (two) times daily. 180 tablet 3  . metFORMIN (GLUCOPHAGE) 500 MG tablet Take 1 tablet (500 mg total) by mouth 2 (two) times daily with a meal. 60 tablet 0  . rivaroxaban (XARELTO) 20 MG TABS tablet Take 20 mg by mouth daily with supper.    Marland Kitchen  spironolactone (ALDACTONE) 25 MG tablet Take 1 tablet (25 mg total) by mouth daily. 90 tablet 3   No current facility-administered medications for this visit.     Allergies:   Patient has no known allergies.   Social History:  The patient  reports that he has never smoked. He has never used smokeless tobacco. He reports that he does not drink alcohol or use drugs.   Family History:  The patient's ***family history includes Diabetes in his mother; Hypertension in his father.    ROS:  Please  see the history of present illness.   Otherwise, review of systems is positive for ***.   All other systems are reviewed and negative.    PHYSICAL EXAM: VS:  There were no vitals taken for this visit. , BMI There is no height or weight on file to calculate BMI. GEN: Well nourished, well developed, in no acute distress  HEENT: normal  Neck: no JVD, carotid bruits, or masses Cardiac: ***RRR; no murmurs, rubs, or gallops,no edema  Respiratory:  clear to auscultation bilaterally, normal work of breathing GI: soft, nontender, nondistended, + BS MS: no deformity or atrophy  Skin: warm and dry euro:  Strength and sensation are intact Psych: euthymic mood, full affect  EKG:  EKG {ACTION; IS/IS ZOX:[id number]} ordered today. Personal review of the ekg ordered shows ***  Recent Labs: 04/25/2016: Brain Natriuretic Peptide 439.2 03/11/2017: Hemoglobin 12.1; Platelets 212; TSH 1.734 03/24/2017: BUN 18; Creatinine, Ser 1.23; Potassium 3.6; Sodium 138    Lipid Panel     Component Value Date/Time   CHOL 191 06/26/2014 1526   TRIG 223.0 (H) 06/26/2014 1526   HDL 40.30 06/26/2014 1526   CHOLHDL 5 06/26/2014 1526   VLDL 44.6 (H) 06/26/2014 1526   LDLCALC 85 05/12/2014 1624   LDLDIRECT 111.5 06/26/2014 1526     Wt Readings from Last 3 Encounters:  03/10/17 220 lb 1.9 oz (99.8 kg)  02/14/17 215 lb (97.5 kg)  02/08/17 215 lb (97.5 kg)      Other studies Reviewed: Additional studies/ records that were reviewed today include: TTE 01/07/13  Review of the above records today demonstrates:  - Left ventricle: The cavity size was mildly dilated. Wall thickness was increased in a pattern of severe LVH. Systolic function was severely reduced. The estimated ejection fraction was in the range of 20% to 25%. Diffuse hypokinesis. Doppler parameters are consistent with restrictive physiology, indicative of decreased left ventricular diastolic compliance and/or increased left atrial  pressure. - Mitral valve: Moderate regurgitation. - Left atrium: The atrium was moderately dilated. - Right ventricle: Systolic function was moderately reduced. - Right atrium: The atrium was mildly dilated. - Pulmonary arteries: Systolic pressure was mildly increased. PA peak pressure: 32mm Hg (S). - Pericardium, extracardiac: A trivial pericardial effusion was identified.   ASSESSMENT AND PLAN:  1.  Nonischemic cardiomyopathy, EF 20-25%: Currently on beta blocker, hydralazine, nitrates, ACE inhibitor, and Aldactone.***  2. Paroxysmal atrial fibrillation/atrial flutter:***  3. Hypertension:***  4. Sleep apnea: Followed by Dr. Mayford Knife.  Current medicines are reviewed at length with the patient today.   The patient {ACTIONS; HAS/DOES NOT HAVE:19233} concerns regarding his medicines.  The following changes were made today:  {NONE DEFAULTED:18576::"none"}  Labs/ tests ordered today include: *** No orders of the defined types were placed in this encounter.    Disposition:   FU with Madeline Bebout {gen number [id number]} {Days to years:10300}  Signed, Loeta Herst Jorja Loa, MD  03/28/2017 8:18 AM     [facility] [address] [place] [address] [phone] (office) [phone] (fax)

## 2017-03-29 ENCOUNTER — Encounter: Payer: Self-pay | Admitting: Cardiology

## 2017-03-30 NOTE — Telephone Encounter (Signed)
FW: SLEEP STUDY SOONER PER PT  Deidre Ala, CMA        [place], this is approved if you would like to reschedule him for a sooner date. Amy

## 2017-04-06 NOTE — Progress Notes (Deleted)
Cardiology Office Note:    Date:  04/06/2017   ID:  Johnathan Archer, DOB [birth year], MRN [id number]  PCP:  Caroll RancherGray, Rebecca, NP  Cardiologist:  Dr. Delane GingerPhil Nahser    Referring MD: Caroll RancherGray, Rebecca, NP   No chief complaint on file. ***  History of Present Illness:    Johnathan CompChristopher D Archer is a 46 y.o. male with a hx of chronic systolic CHF, nonischemic cardiomyopathy, atrial fibrillation, HTN, diabetes, sleep apnea. Patient was previously followed by Dr. Donnie Ahoilley before establishing with Dr. Elease HashimotoNahser. He was seen in the ED in 6/17 with AF with controlled rate. He was not taking his Amiodarone and this was resumed.  His BP has been difficult to control in the past.  He saw endocrinology last year for hyperthyroidism in the setting of amiodarone use.  He was placed on methimazole.  He never had follow-up with endocrinology.  He saw Dr. Elease HashimotoNahser in 8/18 after a trip to the emergency room with recurrent atrial fibrillation.  He had run out of amiodarone.  He underwent cardioversion.  He is pending referral to Dr. Mayford Knifeurner for sleep apnea.  I saw him last 03/10/17.  I adjusted his lisinopril for better blood pressure control.    He was seen in the emergency room 03/11/17 with recurrent atrial flutter.  He underwent cardioversion but reverted back to atrial flutter.  Outpatient follow-up was recommended.  Of note, TSH was 1.734.  I reviewed his case with Dr. Elease HashimotoNahser.  We felt that he would best be served by evaluation with electrophysiology.  Appointment was arranged for 03/28/17 with Dr. Elberta Fortisamnitz.  However, he did not come to this appointment.  ***  [name] ***  Prior CV studies:   The following studies were reviewed today:  Echo 7/14 Severe LVH, EF 20-25%, diffuse HK, restrictive physiology, moderate MR, moderate LAE, moderately reduced RVSF, mild RAE, PASP 32 mmHg, trivial pericardial effusion  LHC 6/04 Left main coronary artery is normal. Left anterior descending normal. Circumflex coronary  artery normal. Right coronary artery normal. IMPRESSION: 1. Severe diffuse hypokinesis with normal coronary arteries compatible witha cardiomyopathy. 2. Increased left ventricular end-diastolic pressure. 3. No significant coronary artery disease identified.  Past Medical History:  Diagnosis Date  . Atrial fibrillation with RVR (HCC) 10/2011   lone episode, converted with IV dilt, on coumadin given elevated CHADS2  . ED (erectile dysfunction)   . History of chicken pox   . Hypertension   . Mitral regurgitation    Moderate by echo 07/2011  . OSA (obstructive sleep apnea)   . 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  cath 2004 negative for obstruction, last echo 07/2011 EF 35%. b) Normal coronary arteries by cath in 2004 (when EF was noted at 15%). c) Last EF 20% by echo 12/2012  . Type II or unspecified type diabetes mellitus without mention of complication, uncontrolled   . Warfarin anticoagulation     Past Surgical History:  Procedure Laterality Date  . CARDIAC CATHETERIZATION  2004   no blockages  . HERNIA REPAIR  2002  . KNEE SURGERY     L knee in HS  . US ECHOCARDIOGRAPHY  07/2011   mod LVH, EF 35%, diffuse hypokinesis of entire myocardium, irreversible restrictive pattern (grade 4 diastolic dysfunction), mod MR, mod dilated LA/RA, decreased RV systolic fxn, no PFO  . US ECHOCARDIOGRAPHY  12/2012   severe LVH, EF 20-25%, mod MR, reduced RV systolic  function    Current Medications: No outpatient prescriptions have been marked as taking for the 04/07/17 encounter (Appointment) with Tereso Newcomer T, PA-C.     Allergies:   Patient has no known allergies.   Social History   Social History  . Marital status: Married    Spouse name: [name]  . Number of children: [name]  . Years of education: [name]   Social History Main Topics  . Smoking status: Never Smoker  . Smokeless tobacco: Never Used  . Alcohol use  No     Comment: Occasional 1x/mo  . Drug use: No  . Sexual activity: Not on file   Other Topics Concern  . Not on file   Social History Narrative   Caffeine: none   Lives with wife and daughter (8yo), no pets   Occupation: Naval architect   Edu: 77yr college   Activity: works, no regular activity   Diet: water daily, tries fruits/vegetables daily     Family Hx: The patient's family history includes Diabetes in his mother; Hypertension in his father. There is no history of Stroke, Coronary artery disease, Cancer, Heart attack, or Thyroid disease.  ROS:   Please see the history of present illness.    ROS All other systems reviewed and are negative.   EKGs/Labs/Other Test Reviewed:    EKG:  EKG is *** ordered today.  The ekg ordered today demonstrates ***  Recent Labs: 04/25/2016: Brain Natriuretic Peptide 439.2 03/11/2017: Hemoglobin 12.1; Platelets 212; TSH 1.734 03/24/2017: BUN 18; Creatinine, Ser 1.23; Potassium 3.6; Sodium 138   Recent Lipid Panel Lab Results  Component Value Date/Time   CHOL 191 06/26/2014 03:26 PM   TRIG 223.0 (H) 06/26/2014 03:26 PM   HDL 40.30 06/26/2014 03:26 PM   CHOLHDL 5 06/26/2014 03:26 PM   LDLCALC 85 05/12/2014 04:24 PM   LDLDIRECT 111.5 06/26/2014 03:26 PM    Physical Exam:    VS:  There were no vitals taken for this visit.    Wt Readings from Last 3 Encounters:  03/10/17 220 lb 1.9 oz (99.8 kg)  02/14/17 215 lb (97.5 kg)  02/08/17 215 lb (97.5 kg)     ***Physical Exam  ASSESSMENT:    1. Hypertensive heart disease with heart failure (HCC)   2. Chronic combined systolic and diastolic CHF (congestive heart failure) (HCC)   3. Paroxysmal atrial fibrillation (HCC)   4. OSA (obstructive sleep apnea)   5. Hyperthyroidism    PLAN:    In order of problems listed above:  Hypertensive heart disease with heart failure (HCC)  Chronic combined systolic and diastolic CHF (congestive heart failure) (HCC)  Paroxysmal atrial  fibrillation (HCC)  OSA (obstructive sleep apnea)  Hyperthyroidism*** 1. Chronic combined systolic and diastolic CHF (congestive heart failure) (HCC)  Nonischemic cardiomyopathy, EF 20-25 by last echo in 2014. Volume appears stable. Medical adherence has been difficult in the past. I suspect his blood pressure has been the major cause of his cardiomyopathy. Continue beta blocker, hydralazine, nitrates, ACE inhibitor, spironolactone.  2. Hypertensive heart disease with heart failure (HCC) Blood pressure is improved.  Continue current dose of carvedilol, furosemide, hydralazine, isosorbide mononitrate, spironolactone. Increase lisinopril to 20 mg twice a day. He has difficulty affording the co-pay and would like to spread out his return visits here. I have asked him to keep an eye on his blood pressure and send some readings after 2 weeks. If his blood pressure remains above target, I will likely adjust his hydralazine to 50 mg 3  times a day.  Check BMET in 2 weeks.  3. Paroxysmal atrial fibrillation (HCC)  Maintaining normal sinus rhythm. Continue amiodarone, carvedilol, rivaroxaban. Obtain LFTs, TSH at next visit.  4. OSA (obstructive sleep apnea) Follow-up with Dr. Mayford Knife as planned.  5. Hyperthyroidism He has not followed up with endocrinology. I have encouraged him to follow-up with endocrinology or to follow-up with primary care for his hyperthyroidism. As he is on amiodarone, he will need a TSH obtained at his next visit.  Dispo:  No Follow-up on file.   Medication Adjustments/Labs and Tests Ordered: Current medicines are reviewed at length with the patient today.  Concerns regarding medicines are outlined above.  Tests Ordered: No orders of the defined types were placed in this encounter.  Medication Changes: No orders of the defined types were placed in this encounter.   Signed, Tereso Newcomer, PA-C  04/06/2017 5:32 PM    [facility] [address]  [place], [place], [place]  [address] Phone: [phone]; Fax: [phone]

## 2017-04-07 ENCOUNTER — Ambulatory Visit: Payer: BLUE CROSS/BLUE SHIELD | Admitting: Physician Assistant

## 2017-04-10 ENCOUNTER — Other Ambulatory Visit: Payer: Self-pay | Admitting: Physician Assistant

## 2017-04-10 DIAGNOSIS — I5042 Chronic combined systolic (congestive) and diastolic (congestive) heart failure: Secondary | ICD-10-CM

## 2017-04-10 DIAGNOSIS — I110 Hypertensive heart disease with heart failure: Secondary | ICD-10-CM

## 2017-04-16 IMAGING — CR DG CHEST 2V
2 series · 2 of 2 positions shown · non-contrast
Comparison: 12/14/2015, 05/22/2014 and earlier.

CLINICAL DATA: 44-year-old presenting with recent onset of
shortness of breath on exertion, chest congestion, cough and
left-sided chest tightness. Current history of atrial fibrillation,
hypertension and mitral regurgitation.

EXAM:
CHEST  2 VIEW

[w chest pa]
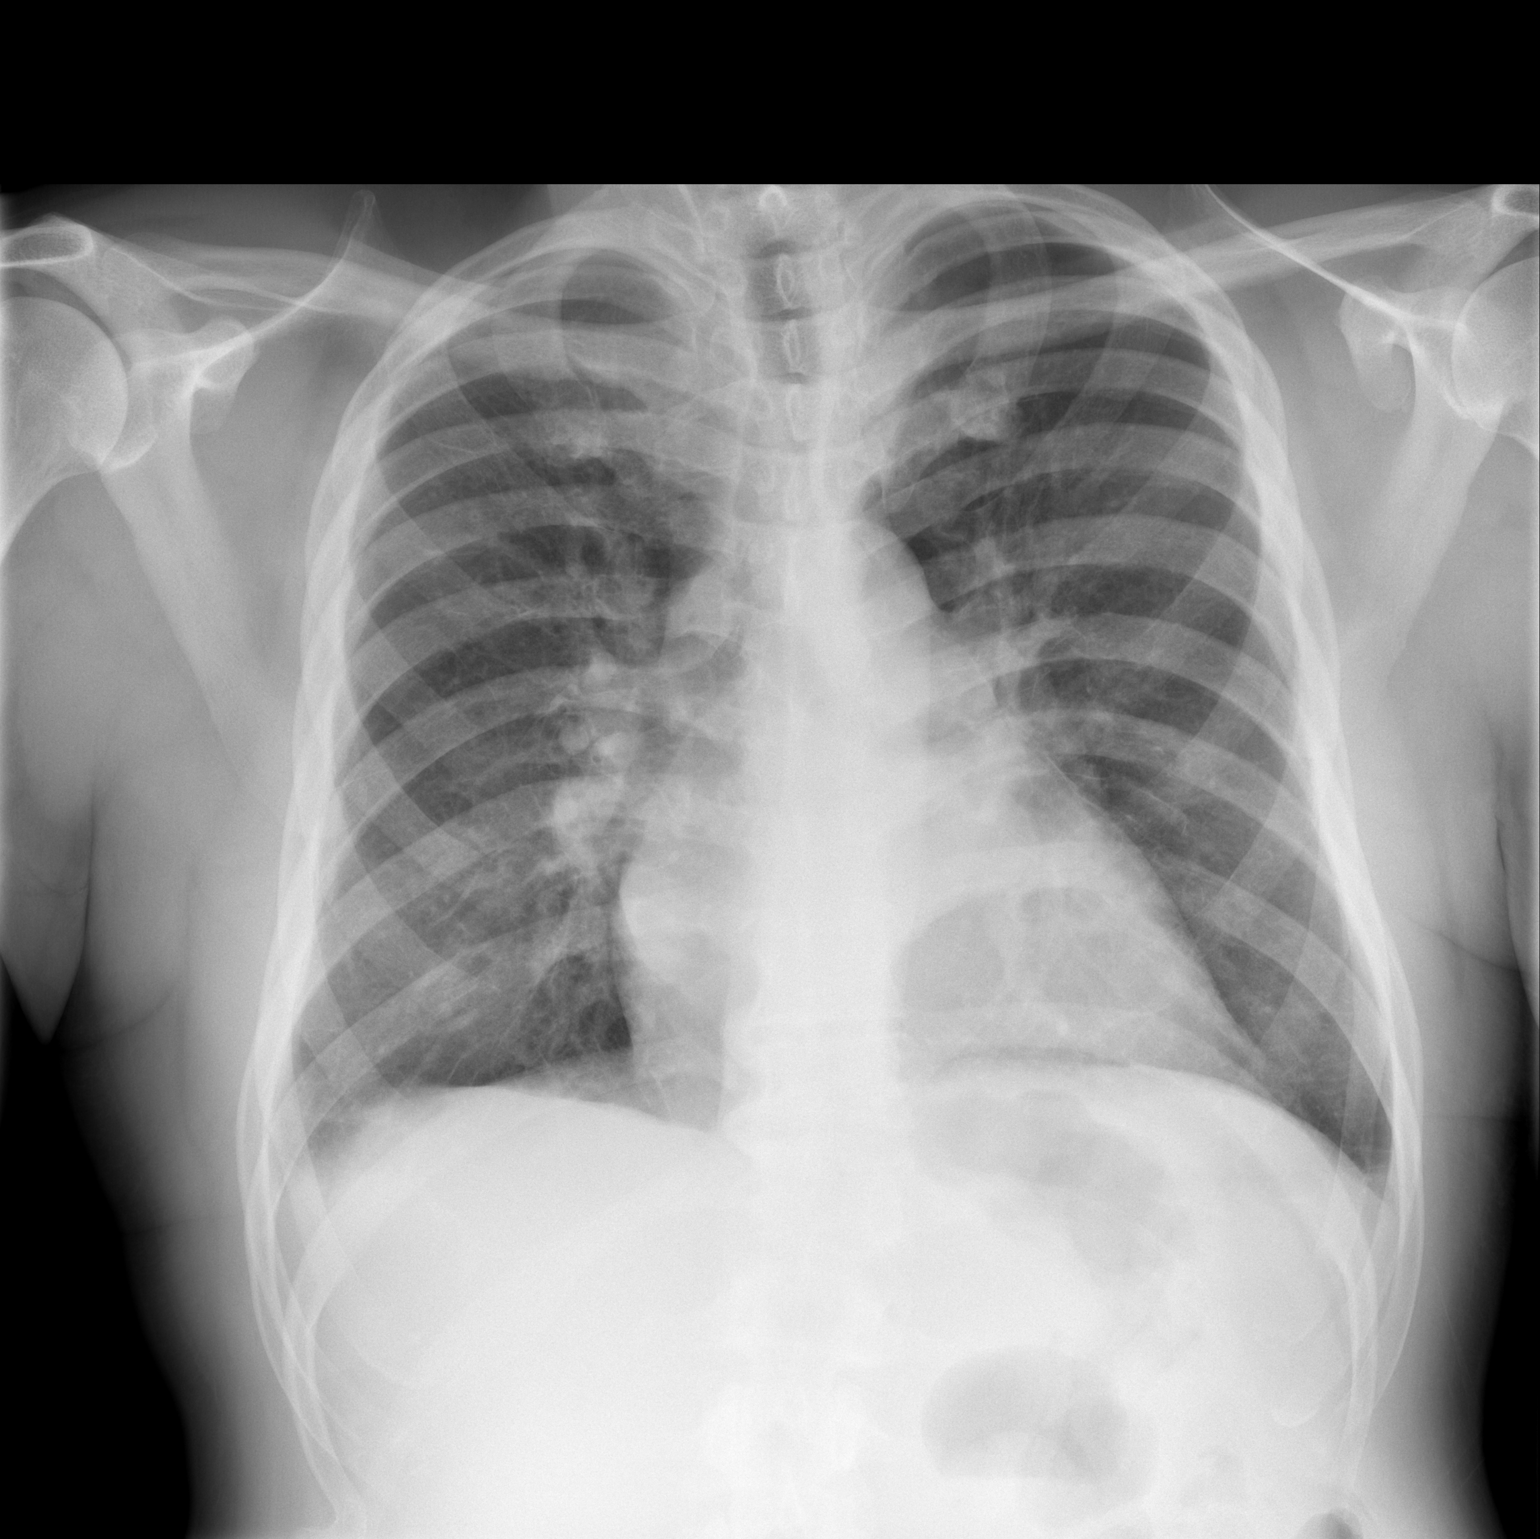

[w chest lat]
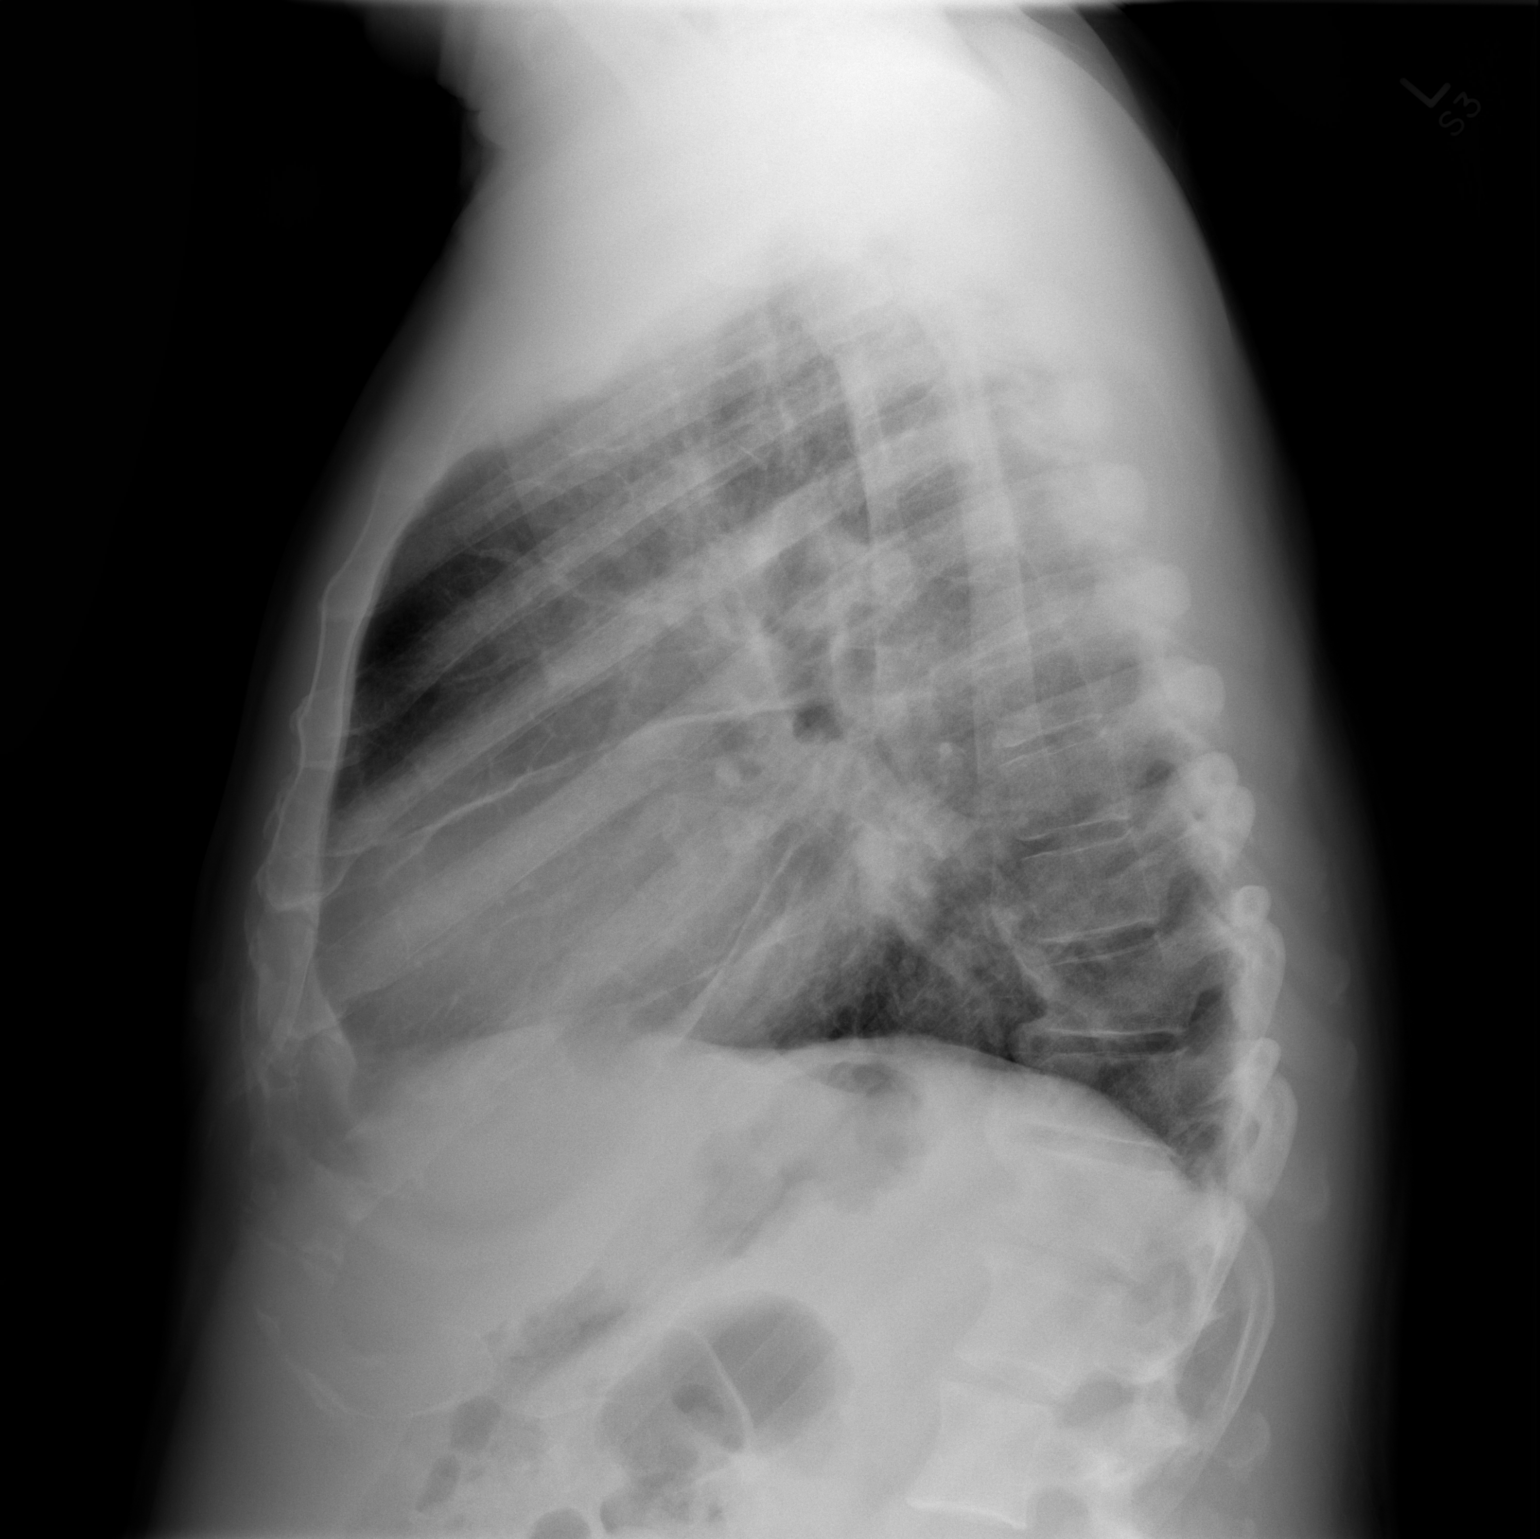

[2 of 2 positions shown; findings below may reference images not displayed]

FINDINGS: Cardiac silhouette mildly enlarged, unchanged dating back to 5606.
Hilar and mediastinal contours otherwise unremarkable. Mild
atelectasis involving right lower lobe. Lungs otherwise clear.
Pulmonary vascularity normal. No pleural effusions. Wedge
compression deformities of T10 and T11, unchanged, with degenerative
changes at the thoracolumbar junction.
IMPRESSION: 1. Mild right lower lobe atelectasis. No acute cardiopulmonary
disease otherwise.
2. Stable mild cardiomegaly without pulmonary edema.

## 2017-04-17 ENCOUNTER — Ambulatory Visit (HOSPITAL_BASED_OUTPATIENT_CLINIC_OR_DEPARTMENT_OTHER): Payer: BLUE CROSS/BLUE SHIELD | Attending: Cardiovascular Disease | Admitting: Cardiology

## 2017-04-17 DIAGNOSIS — G4733 Obstructive sleep apnea (adult) (pediatric): Secondary | ICD-10-CM | POA: Insufficient documentation

## 2017-04-17 DIAGNOSIS — G4736 Sleep related hypoventilation in conditions classified elsewhere: Secondary | ICD-10-CM | POA: Diagnosis not present

## 2017-04-17 DIAGNOSIS — I480 Paroxysmal atrial fibrillation: Secondary | ICD-10-CM | POA: Diagnosis present

## 2017-04-17 DIAGNOSIS — I10 Essential (primary) hypertension: Secondary | ICD-10-CM | POA: Diagnosis present

## 2017-04-22 NOTE — Procedures (Signed)
   Patient Name: Johnathan Archer, Johnathan Archer Date: 04/17/2017 Gender: Male D.O.B: 1971/04/25 Age (years): 45 Referring Provider: Kristeen Miss Height (inches): 71 Interpreting Physician: Armanda Magic MD, ABSM Weight (lbs): 215 RPSGT: Wylie Hail BMI: 30 MRN: [id number] Neck Size: 17.50  CLINICAL INFORMATION Sleep Study Type: NPSG  Indication for sleep study: OSA  Epworth Sleepiness Score: 0  SLEEP STUDY TECHNIQUE As per the AASM Manual for the Scoring of Sleep and Associated Events v2.3 (April 2016) with a hypopnea requiring 4% desaturations.  The channels recorded and monitored were frontal, central and occipital EEG, electrooculogram (EOG), submentalis EMG (chin), nasal and oral airflow, thoracic and abdominal wall motion, anterior tibialis EMG, snore microphone, electrocardiogram, and pulse oximetry.  MEDICATIONS Medications self-administered by patient taken the night of the study : [name]  SLEEP ARCHITECTURE The study was initiated at 10:05:24 PM and ended at 4:07:35 AM.  Sleep onset time was 15.1 minutes and the sleep efficiency was 25.0%. The total sleep time was 90.5 minutes.  Stage REM latency was [name] minutes.  The patient spent 70.72% of the night in stage N1 sleep, 29.28% in stage N2 sleep, 0.00% in stage N3 and 0.00% in REM.  Alpha intrusion was absent.  Supine sleep was 23.26%.  RESPIRATORY PARAMETERS The overall apnea/hypopnea index (AHI) was 33.1 per hour. There were 3 total apneas, including 3 obstructive, 0 central and 0 mixed apneas. There were 47 hypopneas and 16 RERAs.  The AHI during Stage REM sleep was [name] per hour.  AHI while supine was 45.6 per hour.  The mean oxygen saturation was 87.29%. The minimum SpO2 during sleep was 79.00%.  loud snoring was noted during this study.  CARDIAC DATA The 2 lead EKG demonstrated sinus rhythm. The mean heart rate was 69.93 beats per minute. Other EKG findings include: None.  LEG MOVEMENT DATA The  total PLMS were 0 with a resulting PLMS index of 0.00. Associated arousal with leg movement index was 0.0 .  IMPRESSIONS - Severe obstructive sleep apnea occurred during this study (AHI = 33.1/h). - No significant central sleep apnea occurred during this study (CAI = 0.0/h). - Moderate oxygen desaturation was noted during this study (Min O2 = 79.00%). - The patient snored with loud snoring volume. - No cardiac abnormalities were noted during this study. - Clinically significant periodic limb movements did not occur during sleep. No significant associated arousals.  DIAGNOSIS - Obstructive Sleep Apnea (327.23 [G47.33 ICD-10]) - Nocturnal Hypoxemia (327.26 [G47.36 ICD-10])  RECOMMENDATIONS - Therapeutic CPAP titration to determine optimal pressure required to alleviate sleep disordered breathing. - Positional therapy avoiding supine position during sleep. - Avoid alcohol, sedatives and other CNS depressants that may worsen sleep apnea and disrupt normal sleep architecture. - Sleep hygiene should be reviewed to assess factors that may improve sleep quality. - Weight management and regular exercise should be initiated or continued if appropriate.  Armanda Magic Diplomate, American Board of Sleep Medicine  ELECTRONICALLY SIGNED ON:  04/22/2017, 8:34 PM [facility] PH: [phone]   FX: [phone] ACCREDITED BY THE AMERICAN ACADEMY OF SLEEP MEDICINE

## 2017-04-26 ENCOUNTER — Telehealth: Payer: Self-pay | Admitting: *Deleted

## 2017-04-26 NOTE — Telephone Encounter (Signed)
LMTCB

## 2017-04-26 NOTE — Telephone Encounter (Signed)
-----   Message from Quintella Reichert, MD sent at 04/22/2017  8:36 PM EDT ----- Please let patient know that they have sleep apnea and recommend CPAP titration. Please set up titration in the sleep lab.

## 2017-05-01 NOTE — Telephone Encounter (Signed)
LMTCB

## 2017-05-10 ENCOUNTER — Encounter: Payer: Self-pay | Admitting: *Deleted

## 2017-05-10 NOTE — Telephone Encounter (Signed)
LMTCB

## 2017-05-10 NOTE — Telephone Encounter (Signed)
LMTCB Per DPR on cell

## 2017-05-10 NOTE — Telephone Encounter (Signed)
LMTCB on cell

## 2017-05-10 NOTE — Telephone Encounter (Signed)
No contact letter sent today. 

## 2017-06-03 ENCOUNTER — Other Ambulatory Visit: Payer: Self-pay | Admitting: Cardiovascular Disease

## 2017-06-05 NOTE — Telephone Encounter (Signed)
Pt last saw Dr Elease Hashimoto 02/14/17, last labs 03/24/17 Creat 1.23, age 46, weight 99.8kg, CrCl 104.8, based on CrCl pt is on appropriate dosage of Xarelto 20mg  QD.  Will refill rx.

## 2017-07-18 ENCOUNTER — Other Ambulatory Visit: Payer: Self-pay | Admitting: Cardiovascular Disease

## 2017-08-11 ENCOUNTER — Other Ambulatory Visit: Payer: Self-pay | Admitting: Physician Assistant

## 2017-08-11 DIAGNOSIS — I110 Hypertensive heart disease with heart failure: Secondary | ICD-10-CM

## 2017-08-11 DIAGNOSIS — I480 Paroxysmal atrial fibrillation: Secondary | ICD-10-CM

## 2017-09-04 ENCOUNTER — Encounter: Payer: Self-pay | Admitting: Physician Assistant

## 2017-09-18 ENCOUNTER — Encounter: Payer: Self-pay | Admitting: *Deleted

## 2017-09-19 ENCOUNTER — Ambulatory Visit: Payer: BLUE CROSS/BLUE SHIELD | Admitting: Physician Assistant

## 2017-11-14 ENCOUNTER — Emergency Department (HOSPITAL_COMMUNITY): Payer: BLUE CROSS/BLUE SHIELD

## 2017-11-14 ENCOUNTER — Encounter (HOSPITAL_COMMUNITY): Payer: Self-pay

## 2017-11-14 ENCOUNTER — Emergency Department (HOSPITAL_COMMUNITY)
Admission: EM | Admit: 2017-11-14 | Discharge: 2017-11-14 | Disposition: A | Discharge status: Home / Self Care | Payer: BLUE CROSS/BLUE SHIELD | Attending: Emergency Medicine | Admitting: Emergency Medicine

## 2017-11-14 ENCOUNTER — Other Ambulatory Visit: Payer: Self-pay

## 2017-11-14 DIAGNOSIS — Z5321 Procedure and treatment not carried out due to patient leaving prior to being seen by health care provider: Secondary | ICD-10-CM | POA: Diagnosis not present

## 2017-11-14 DIAGNOSIS — R42 Dizziness and giddiness: Secondary | ICD-10-CM

## 2017-11-14 DIAGNOSIS — R51 Headache: Secondary | ICD-10-CM | POA: Diagnosis present

## 2017-11-14 LAB — URINALYSIS, ROUTINE W REFLEX MICROSCOPIC
Bilirubin Urine: NEGATIVE
KETONES UR: NEGATIVE mg/dL
Leukocytes, UA: NEGATIVE
NITRITE: NEGATIVE
PH: 5 (ref 5.0–8.0)
Protein, ur: NEGATIVE mg/dL
Specific Gravity, Urine: 1.013 (ref 1.005–1.030)

## 2017-11-14 LAB — BASIC METABOLIC PANEL
ANION GAP: 9 (ref 5–15)
BUN: 26 mg/dL — ABNORMAL HIGH (ref 6–20)
CALCIUM: 8.8 mg/dL — AB (ref 8.9–10.3)
CHLORIDE: 102 mmol/L (ref 101–111)
CO2: 23 mmol/L (ref 22–32)
Creatinine, Ser: 1.55 mg/dL — ABNORMAL HIGH (ref 0.61–1.24)
GFR calc non Af Amer: 52 mL/min — ABNORMAL LOW (ref >60)
GFR, EST AFRICAN AMERICAN: 60 mL/min — AB (ref >60)
GLUCOSE: 398 mg/dL — AB (ref 65–99)
Potassium: 4.5 mmol/L (ref 3.5–5.1)
Sodium: 134 mmol/L — ABNORMAL LOW (ref 135–145)

## 2017-11-14 LAB — CBG MONITORING, ED: Glucose-Capillary: 391 mg/dL — ABNORMAL HIGH (ref 65–99)

## 2017-11-14 LAB — CBC
HCT: 39.3 % (ref 39.0–52.0)
HEMOGLOBIN: 13 g/dL (ref 13.0–17.0)
MCH: 31.5 pg (ref 26.0–34.0)
MCHC: 33.1 g/dL (ref 30.0–36.0)
MCV: 95.2 fL (ref 78.0–100.0)
Platelets: 228 10*3/uL (ref 150–400)
RBC: 4.13 MIL/uL — AB (ref 4.22–5.81)
RDW: 12.4 % (ref 11.5–15.5)
WBC: 5.9 10*3/uL (ref 4.0–10.5)

## 2017-11-14 NOTE — ED Notes (Signed)
Pt approached desk and asked about wait times. Discussed that we would like for him to stay and see a physician. Pt states he will wait until 2100.

## 2017-11-14 NOTE — ED Notes (Signed)
Pt states he will go home now and follow up with his physician in the morning.

## 2017-11-14 NOTE — ED Triage Notes (Signed)
Pt presents with onset of visual acuity change while at work today.  Pt reports he can't see in both peripheral visual fields, with vision blurred in front of him.  Pt reports R temporal headache that started with vision change.

## 2017-11-14 NOTE — ED Provider Notes (Signed)
Patient placed in Quick Look pathway, seen and evaluated   Chief Complaint: blurred vision  HPI:   Pt with PMHx PAF on xarelto, HTN, HLD, DM, presenting to the ED with complaint of an episode of blurred vision that began at 2pm. He states he was at work and began feeling lightheaded. He states he then began losing his peripheral vision bilaterally. Reports his vision changes have nearly resolved, though he has a dull right sided headache. Also states he thinks he is currently in a fib. Denies numbness or unilateral weakness to extremities, no slurred speech of facial droop. No falls or head trauma. No hx CVA.   ROS: + vision changes, +HA  Physical Exam:   Gen: No distress  Neuro: Awake and Alert  Skin: Warm    Focused Exam:  Mental Status:  Alert, oriented, thought content appropriate, able to give a coherent history. Speech fluent without evidence of aphasia. Able to follow 2 step commands without difficulty.  Cranial Nerves:  II:  Peripheral visual fields grossly normal, pupils equal, round, reactive to light III,IV, VI: ptosis not present, extra-ocular motions intact bilaterally  V,VII: smile symmetric, facial light touch sensation equal VIII: hearing grossly normal to voice  X: uvula elevates symmetrically  XI: bilateral shoulder shrug symmetric and strong XII: midline tongue extension without fassiculations Motor:  Normal tone. 5/5 in upper and lower extremities bilaterally including strong and equal grip strength and dorsiflexion/plantar flexion Sensory: Pinprick and light touch normal in all extremities.  Deep Tendon Reflexes: 2+ and symmetric in the biceps and patella Cerebellar: normal finger-to-nose with bilateral upper extremities Gait: normal gait and balance CV: distal pulses palpable throughout    Pt discussed with Dr. Anitra Lauth  Initiation of care has begun. The patient has been counseled on the process, plan, and necessity for staying for the completion/evaluation,  and the remainder of the medical screening examination    Davina Howlett, [place] N, PA-C 11/14/17 1741    Gwyneth Sprout, MD 11/20/17 2115

## 2017-11-15 NOTE — ED Notes (Signed)
Follow up call made Feels some better  Will follow up with pcp 0920  11/15/17  s Jadin Kagel rn

## 2017-12-12 ENCOUNTER — Encounter (INDEPENDENT_AMBULATORY_CARE_PROVIDER_SITE_OTHER): Payer: BLUE CROSS/BLUE SHIELD | Admitting: Ophthalmology

## 2018-01-10 ENCOUNTER — Other Ambulatory Visit: Payer: Self-pay

## 2018-01-10 MED ORDER — RIVAROXABAN 20 MG PO TABS: ORAL_TABLET | ORAL | 1 refills | Status: DC

## 2018-02-19 ENCOUNTER — Other Ambulatory Visit: Payer: Self-pay | Admitting: Physician Assistant

## 2018-02-19 DIAGNOSIS — I110 Hypertensive heart disease with heart failure: Secondary | ICD-10-CM

## 2018-02-21 ENCOUNTER — Ambulatory Visit: Payer: BLUE CROSS/BLUE SHIELD | Admitting: Physician Assistant

## 2018-03-14 ENCOUNTER — Other Ambulatory Visit: Payer: Self-pay | Admitting: Physician Assistant

## 2018-03-14 MED ORDER — LISINOPRIL 20 MG PO TABS: 20.0000 mg | ORAL_TABLET | Freq: Two times a day (BID) | ORAL | 0 refills | Status: DC

## 2018-03-14 NOTE — Telephone Encounter (Signed)
Pt's medication was sent to pt's pharmacy as requested. Confirmation received.  °

## 2018-03-19 NOTE — Progress Notes (Deleted)
Cardiology Office Note:    Date:  03/19/2018   ID:  Johnathan Archer, DOB 1971/05/25, MRN [id number]  PCP:  Johnathan Rancher, NP  Cardiologist:  No primary care provider on file. *** Electrophysiologist:  None   Referring MD: Johnathan Rancher, NP   No chief complaint on file. ***  History of Present Illness:    Johnathan Archer is a 47 y.o. male with chronic systolic CHF, nonischemic cardiomyopathy, atrial fibrillation, HTN, diabetes, sleep apnea, hyperthyroidism. Patient was previously followed by Dr. Donnie Archer before establishing with Dr. Elease Archer. He has run out of his medication several times in the past.  he has had difficult to control blood pressure and has been non-adherent to medications in the past.  He was last seen in 02/2017.  He was seen in the ED shortly after this and failed DCCV for recurrent AFlutter.  He was referred to EP but did not show up for his appointment.  Sleep study in 10/18 demonstrated severe obstructive sleep apnea.  He has not been reached to arrange CPAP titration.  ***  Johnathan Archer ***  Prior CV studies:   The following studies were reviewed today:  Echo 7/14 Severe LVH, EF 20-25%, diffuse HK, restrictive physiology, moderate MR, moderate LAE, moderately reduced RVSF, mild RAE, PASP 32 mmHg, trivial pericardial effusion  LHC 6/04 Left main coronary artery is normal. Left anterior descending normal. Circumflex coronary artery normal. Right coronary artery normal. IMPRESSION: 1. Severe diffuse hypokinesis with normal coronary arteries compatible witha cardiomyopathy. 2. Increased left ventricular end-diastolic pressure. 3. No significant coronary artery disease identified.  Past Medical History:  Diagnosis Date  . Atrial fibrillation with RVR (HCC) 10/2011   lone episode, converted with IV dilt, on coumadin given elevated CHADS2  . Chest pain, atypical 03/06/2015  . Chronic combined systolic and diastolic CHF (congestive heart failure) (HCC)    . ED (erectile dysfunction)   . History of chicken pox   . History of noncompliance with medical treatment 03/06/2015  . Hypertension   . Hypertensive heart disease with heart failure (HCC) 03/28/2016  . Hyperthyroidism 03/15/2016  . Long term current use of anticoagulant therapy 01/14/2013  . Mitral regurgitation    Moderate by echo 07/2011  . Nonischemic cardiomyopathy (HCC) 12/27/2012  . Normocytic anemia 03/15/2016  . Obesity (BMI 30-39.9) 01/21/2014  . OSA (obstructive sleep apnea)   . Paroxysmal atrial fibrillation (HCC) 10/19/2011   lone episode, converted with IV dilt, on coumadin given elevated CHADS2   . 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  cath 2004 negative for obstruction, last echo 07/2011 EF 35%. b) Normal coronary arteries by cath in 2004 (when EF was noted at 15%). c) Last EF 20% by echo 12/2012  . Type II diabetes mellitus, uncontrolled (HCC)   . Type II or unspecified type diabetes mellitus without mention of complication, uncontrolled   . Warfarin anticoagulation    Surgical Hx: The patient  has a past surgical history that includes Hernia repair (2002); Knee surgery; Cardiac catheterization (2004); US ECHOCARDIOGRAPHY (07/2011); and US ECHOCARDIOGRAPHY (12/2012).   Current Medications: No outpatient medications have been marked as taking for the 03/20/18 encounter (Appointment) with Tereso Newcomer T, PA-C.     Allergies:   Patient has no known allergies.   Social History   Tobacco Use  . Smoking status: Never Smoker  . Smokeless tobacco: Never Used  Substance Use Topics  . Alcohol use: No  Alcohol/week: 0.0 standard drinks    Comment: Occasional 1x/mo  . Drug use: No     Family Hx: The patient's family history includes Diabetes in his mother; Hypertension in his father. There is no history of Stroke, Coronary artery disease, Cancer, Heart attack, or Thyroid disease.  ROS:   Please  see the history of present illness.    ROS All other systems reviewed and are negative.   EKGs/Labs/Other Test Reviewed:    EKG:  EKG is *** ordered today.  The ekg ordered today demonstrates ***  Recent Labs: 11/14/2017: BUN 26; Creatinine, Ser 1.55; Hemoglobin 13.0; Platelets 228; Potassium 4.5; Sodium 134   Recent Lipid Panel Lab Results  Component Value Date/Time   CHOL 191 06/26/2014 03:26 PM   TRIG 223.0 (H) 06/26/2014 03:26 PM   HDL 40.30 06/26/2014 03:26 PM   CHOLHDL 5 06/26/2014 03:26 PM   LDLCALC 85 05/12/2014 04:24 PM   LDLDIRECT 111.5 06/26/2014 03:26 PM    Physical Exam:    VS:  There were no vitals taken for this visit.    Wt Readings from Last 3 Encounters:  11/14/17 215 lb (97.5 kg)  03/10/17 220 lb 1.9 oz (99.8 kg)  02/14/17 215 lb (97.5 kg)     ***Physical Exam  ASSESSMENT & PLAN:    Chronic combined systolic and diastolic CHF (congestive heart failure) (HCC)  Hypertensive heart disease with heart failure (HCC)  Paroxysmal atrial fibrillation (HCC)  OSA (obstructive sleep apnea)  Hyperthyroidism***  1. Chronic combined systolic and diastolic CHF (congestive heart failure) (HCC)  Nonischemic cardiomyopathy, EF 20-25 by last echo in 2014. Volume appears stable. Medical adherence has been difficult in the past. I suspect his blood pressure has been the major cause of his cardiomyopathy. Continue beta blocker, hydralazine, nitrates, ACE inhibitor, spironolactone.  2. Hypertensive heart disease with heart failure (HCC) Blood pressure is improved.  Continue current dose of carvedilol, furosemide, hydralazine, isosorbide mononitrate, spironolactone. Increase lisinopril to 20 mg twice a day. He has difficulty affording the co-pay and would like to spread out his return visits here. I have asked him to keep an eye on his blood pressure and send some readings after 2 weeks. If his blood pressure remains above target, I will likely adjust his hydralazine to 50  mg 3 times a day.  Check BMET in 2 weeks.  3. Paroxysmal atrial fibrillation (HCC)  Maintaining normal sinus rhythm. Continue amiodarone, carvedilol, rivaroxaban. Obtain LFTs, TSH at next visit.  4. OSA (obstructive sleep apnea) Follow-up with Dr. Mayford Knife as planned.  5. Hyperthyroidism He has not followed up with endocrinology. I have encouraged him to follow-up with endocrinology or to follow-up with primary care for his hyperthyroidism. As he is on amiodarone, he will need a TSH obtained at his next visit.  Dispo:  No follow-ups on file.   Medication Adjustments/Labs and Tests Ordered: Current medicines are reviewed at length with the patient today.  Concerns regarding medicines are outlined above.  Tests Ordered: No orders of the defined types were placed in this encounter.  Medication Changes: No orders of the defined types were placed in this encounter.   Signed, Tereso Newcomer, PA-C  03/19/2018 10:40 PM    [facility] [address] [place], [place], [place]  [address] Phone: [phone]; Fax: [phone]

## 2018-03-20 ENCOUNTER — Encounter

## 2018-03-20 ENCOUNTER — Ambulatory Visit: Payer: BLUE CROSS/BLUE SHIELD | Admitting: Physician Assistant

## 2018-03-20 DIAGNOSIS — R0989 Other specified symptoms and signs involving the circulatory and respiratory systems: Secondary | ICD-10-CM

## 2018-04-03 ENCOUNTER — Other Ambulatory Visit: Payer: Self-pay | Admitting: Cardiovascular Disease

## 2018-04-12 IMAGING — CR DG CHEST 2V
2 series · 2 of 2 positions shown · non-contrast
Comparison: 02/08/2017 and 03/15/2016

CLINICAL DATA: Tachycardia since this morning which
shortness-of-breath and fatigue.

EXAM:
CHEST  2 VIEW

[chest ap]
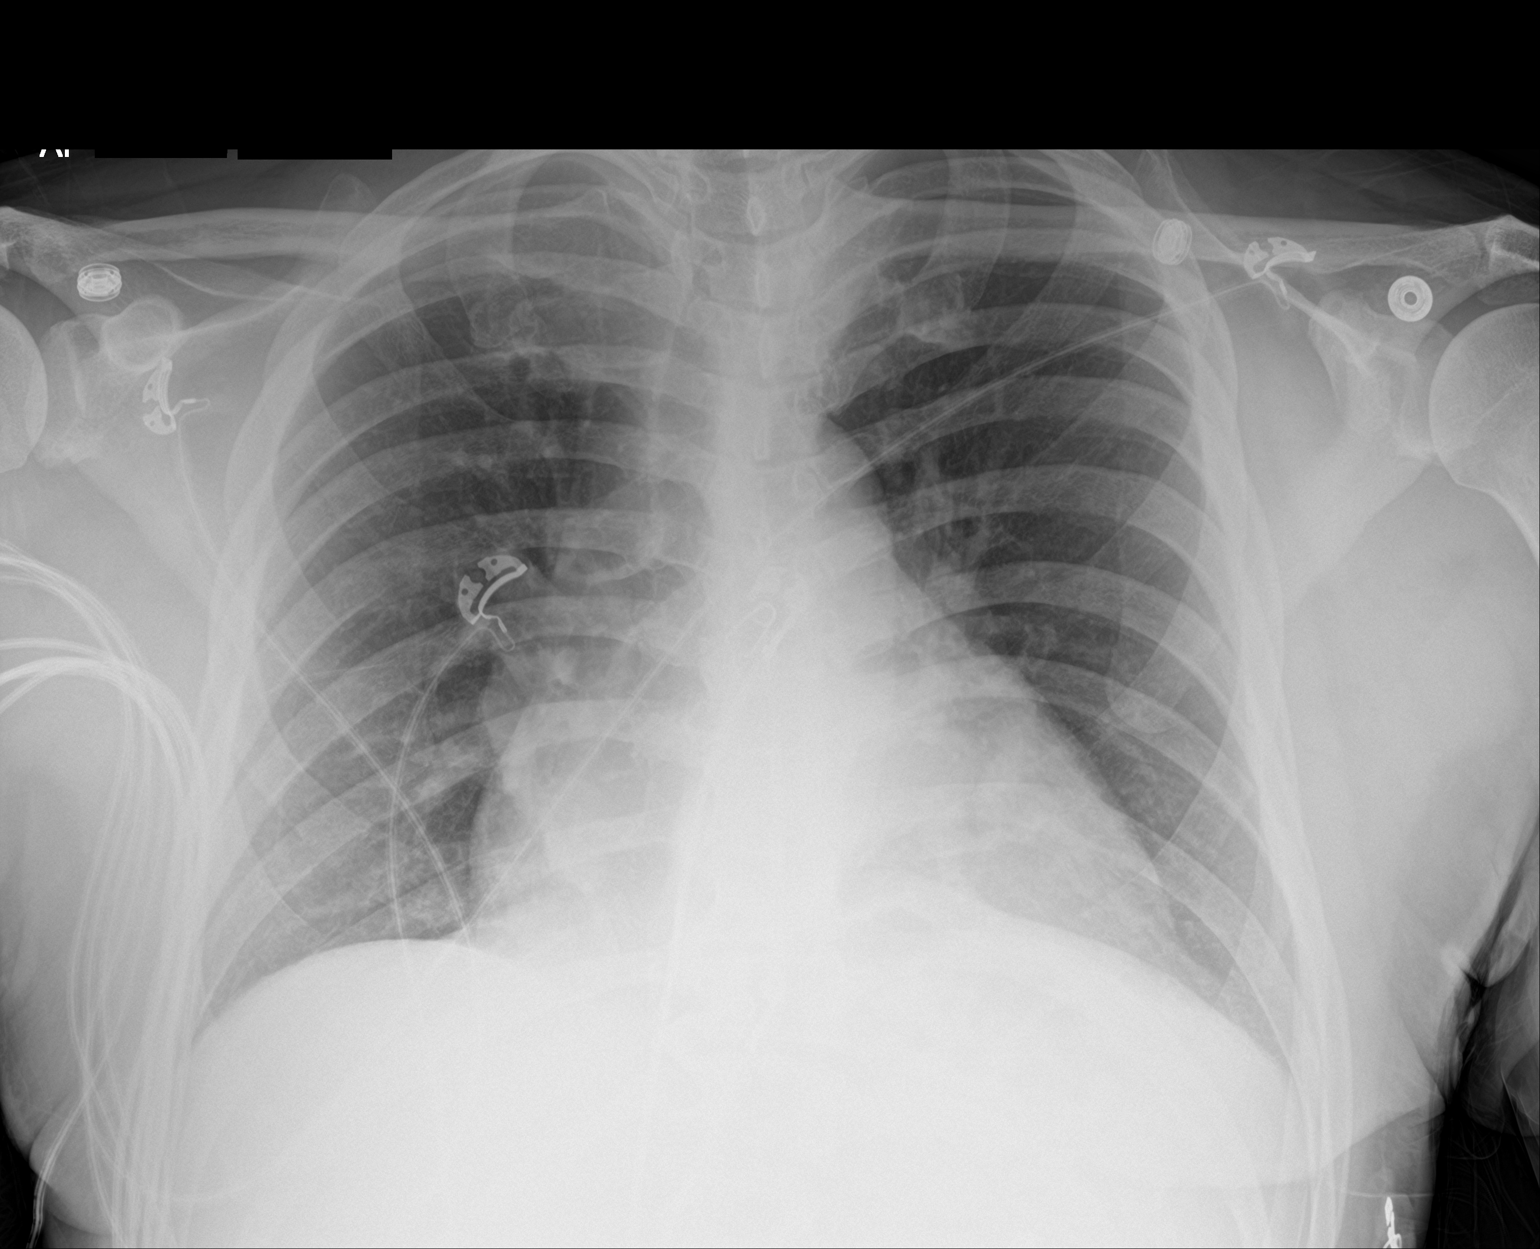

[chest lat]
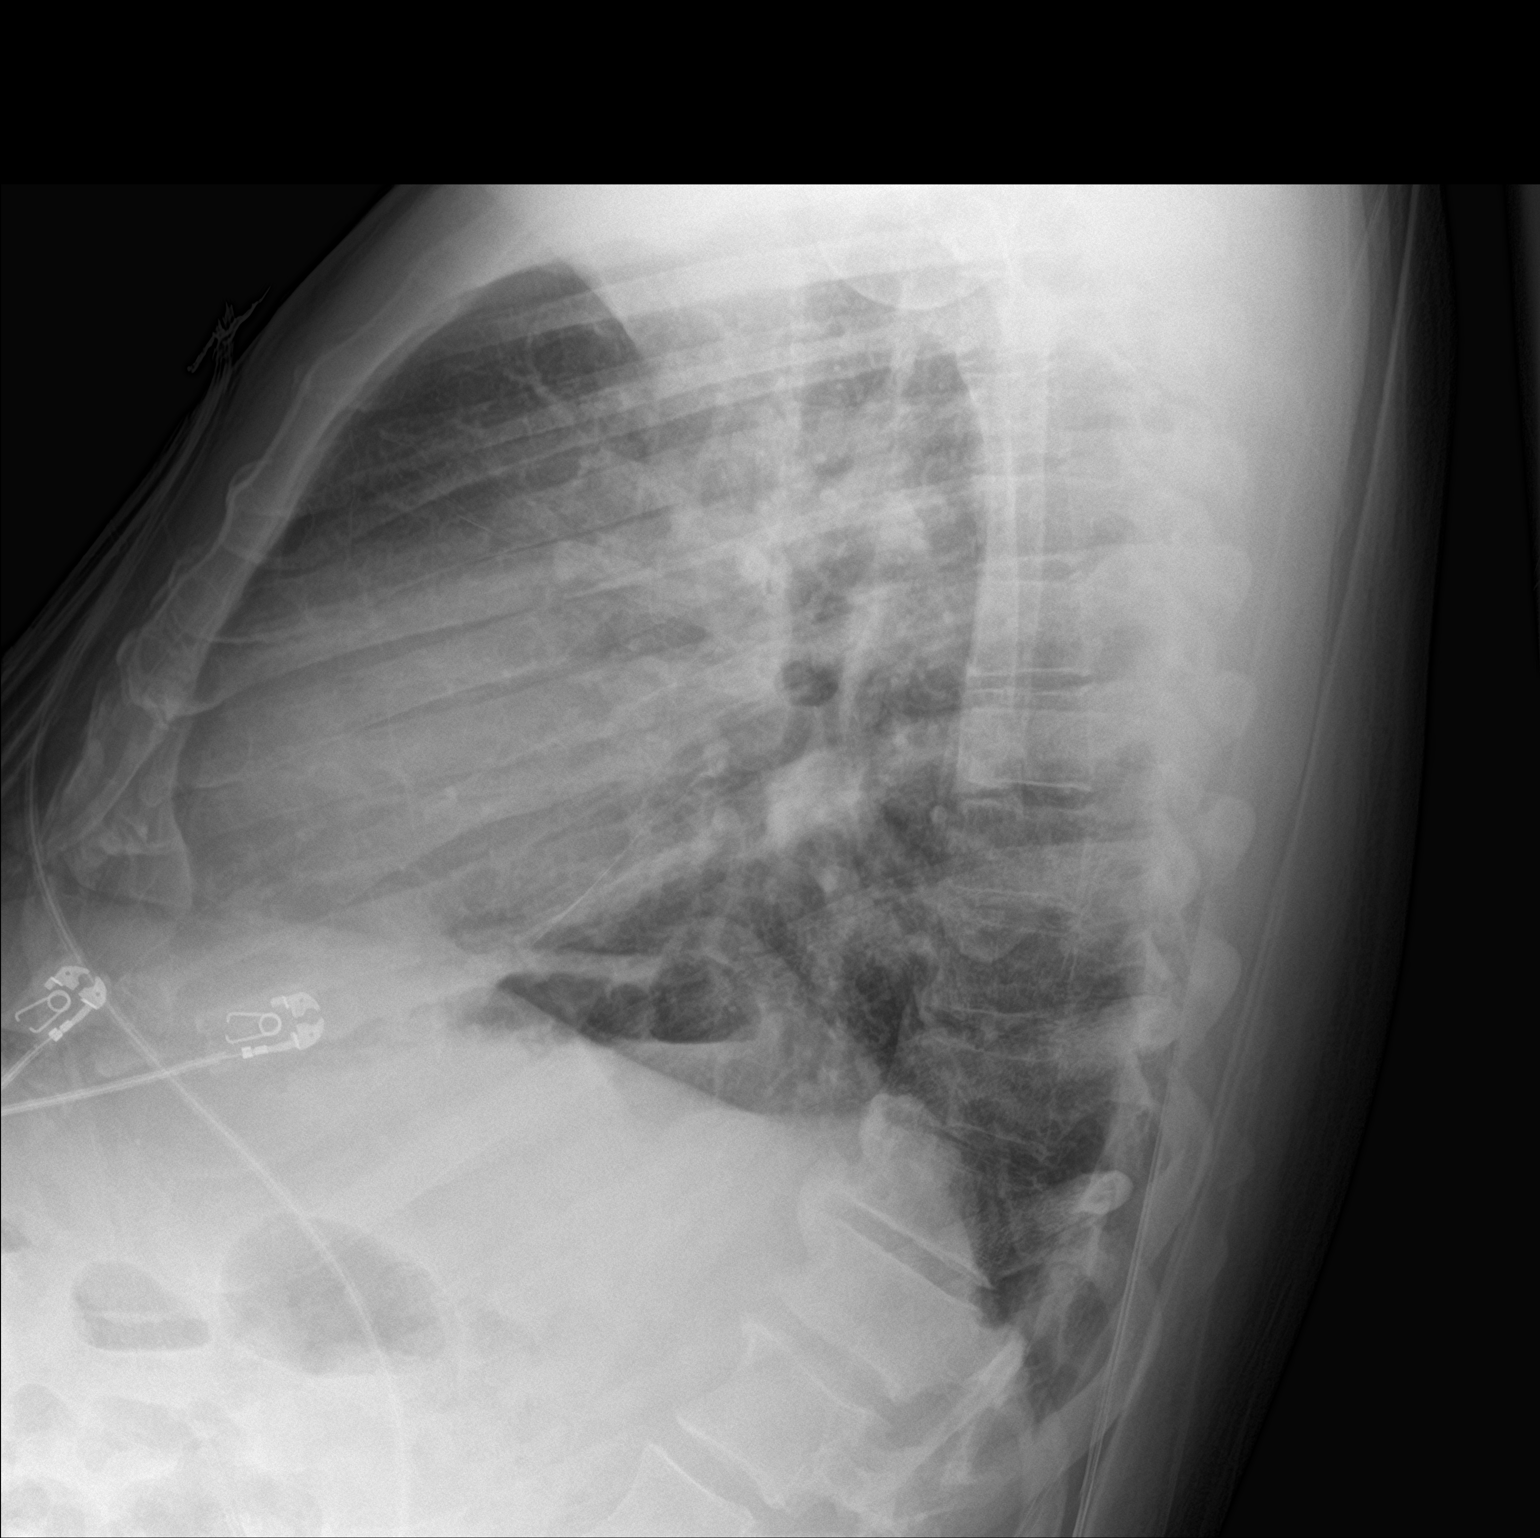

[2 of 2 positions shown; findings below may reference images not displayed]

FINDINGS: Lungs are adequately inflated and otherwise clear. Mild
cardiomegaly. Mild degenerate change of the spine. 2.2 cm linear
dense structure over the right neck base unchanged.
IMPRESSION: No active cardiopulmonary disease.

Mild cardiomegaly.

## 2018-05-18 ENCOUNTER — Other Ambulatory Visit: Payer: Self-pay | Admitting: Physician Assistant

## 2018-05-29 ENCOUNTER — Other Ambulatory Visit: Payer: Self-pay | Admitting: Cardiovascular Disease

## 2018-05-29 ENCOUNTER — Other Ambulatory Visit: Payer: Self-pay | Admitting: Physician Assistant

## 2018-05-29 DIAGNOSIS — I110 Hypertensive heart disease with heart failure: Secondary | ICD-10-CM

## 2018-05-30 ENCOUNTER — Other Ambulatory Visit: Payer: Self-pay

## 2018-05-30 DIAGNOSIS — I110 Hypertensive heart disease with heart failure: Secondary | ICD-10-CM

## 2018-05-30 MED ORDER — ISOSORBIDE MONONITRATE ER 30 MG PO TB24: 30.0000 mg | ORAL_TABLET | Freq: Every day | ORAL | 0 refills | Status: DC

## 2018-06-21 ENCOUNTER — Other Ambulatory Visit: Payer: Self-pay | Admitting: Cardiovascular Disease

## 2018-07-02 ENCOUNTER — Other Ambulatory Visit: Payer: Self-pay | Admitting: Cardiovascular Disease

## 2018-07-02 DIAGNOSIS — I110 Hypertensive heart disease with heart failure: Secondary | ICD-10-CM

## 2018-07-16 ENCOUNTER — Other Ambulatory Visit: Payer: Self-pay | Admitting: Cardiovascular Disease

## 2018-07-19 ENCOUNTER — Other Ambulatory Visit: Payer: Self-pay | Admitting: Cardiovascular Disease

## 2018-07-19 DIAGNOSIS — I110 Hypertensive heart disease with heart failure: Secondary | ICD-10-CM

## 2018-07-24 ENCOUNTER — Other Ambulatory Visit: Payer: Self-pay | Admitting: Cardiovascular Disease

## 2018-07-24 DIAGNOSIS — I480 Paroxysmal atrial fibrillation: Secondary | ICD-10-CM

## 2018-07-24 DIAGNOSIS — I110 Hypertensive heart disease with heart failure: Secondary | ICD-10-CM

## 2018-07-24 DIAGNOSIS — I5042 Chronic combined systolic (congestive) and diastolic (congestive) heart failure: Secondary | ICD-10-CM

## 2018-07-24 NOTE — Telephone Encounter (Signed)
Pt seen Dr Elease Hashimoto last on 01/2017 and seen Tereso Newcomer last on 02/2017 and was advised to come back in 6 weeks. He has not been seen since. He is requesting these 2 medication refills. Please advise!

## 2018-07-25 ENCOUNTER — Encounter: Payer: Self-pay | Admitting: *Deleted

## 2018-07-25 MED ORDER — AMIODARONE HCL 200 MG PO TABS: 200.0000 mg | ORAL_TABLET | Freq: Every day | ORAL | 0 refills | Status: AC

## 2018-07-25 MED ORDER — FUROSEMIDE 40 MG PO TABS: 40.0000 mg | ORAL_TABLET | Freq: Two times a day (BID) | ORAL | 0 refills | Status: DC

## 2018-07-25 MED ORDER — CARVEDILOL 25 MG PO TABS: ORAL_TABLET | ORAL | 0 refills | Status: DC

## 2018-07-25 MED ORDER — SPIRONOLACTONE 25 MG PO TABS: 25.0000 mg | ORAL_TABLET | Freq: Every day | ORAL | 0 refills | Status: DC

## 2018-07-25 MED ORDER — LISINOPRIL 20 MG PO TABS: 20.0000 mg | ORAL_TABLET | Freq: Two times a day (BID) | ORAL | 0 refills | Status: DC

## 2018-07-25 MED ORDER — HYDRALAZINE HCL 50 MG PO TABS: 50.0000 mg | ORAL_TABLET | Freq: Two times a day (BID) | ORAL | 0 refills | Status: DC

## 2018-07-25 NOTE — Telephone Encounter (Signed)
Operator called and said pt's meds have not been sent in yet,. I confirmed pt has not been seen in 2 yrs. I advised I will send in cardiac meds though pt needs to see Tereso Newcomer, PA in the next few weeks.

## 2018-07-25 NOTE — Telephone Encounter (Signed)
This encounter was created in error - please disregard.

## 2018-08-08 ENCOUNTER — Encounter: Payer: Self-pay | Admitting: Physician Assistant

## 2018-08-08 ENCOUNTER — Ambulatory Visit (INDEPENDENT_AMBULATORY_CARE_PROVIDER_SITE_OTHER): Payer: BLUE CROSS/BLUE SHIELD | Admitting: Physician Assistant

## 2018-08-08 VITALS — BP 140/98 | HR 80 | Ht 71.0 in | Wt 232.4 lb

## 2018-08-08 DIAGNOSIS — E0590 Thyrotoxicosis, unspecified without thyrotoxic crisis or storm: Secondary | ICD-10-CM

## 2018-08-08 DIAGNOSIS — I428 Other cardiomyopathies: Secondary | ICD-10-CM

## 2018-08-08 DIAGNOSIS — I4891 Unspecified atrial fibrillation: Secondary | ICD-10-CM | POA: Diagnosis not present

## 2018-08-08 DIAGNOSIS — I110 Hypertensive heart disease with heart failure: Secondary | ICD-10-CM | POA: Diagnosis not present

## 2018-08-08 DIAGNOSIS — I5042 Chronic combined systolic (congestive) and diastolic (congestive) heart failure: Secondary | ICD-10-CM | POA: Diagnosis not present

## 2018-08-08 DIAGNOSIS — IMO0002 Reserved for concepts with insufficient information to code with codable children: Secondary | ICD-10-CM

## 2018-08-08 DIAGNOSIS — I4892 Unspecified atrial flutter: Secondary | ICD-10-CM

## 2018-08-08 DIAGNOSIS — R079 Chest pain, unspecified: Secondary | ICD-10-CM

## 2018-08-08 MED ORDER — SACUBITRIL-VALSARTAN 49-51 MG PO TABS: 1.0000 | ORAL_TABLET | Freq: Two times a day (BID) | ORAL | 11 refills | Status: DC

## 2018-08-08 MED ORDER — SACUBITRIL-VALSARTAN 49-51 MG PO TABS: 1.0000 | ORAL_TABLET | Freq: Two times a day (BID) | ORAL | 11 refills | Status: AC

## 2018-08-08 MED ORDER — FUROSEMIDE 40 MG PO TABS: 60.0000 mg | ORAL_TABLET | Freq: Two times a day (BID) | ORAL | 3 refills | Status: AC

## 2018-08-08 NOTE — Progress Notes (Signed)
Cardiology Office Note:    Date:  08/08/2018   ID:  Johnathan Archer, DOB 1971/05/21, MRN [id number]  PCP:  Caroll RancherGray, Rebecca, NP  Cardiologist:  Kristeen MissPhilip Nahser, MD   Electrophysiologist:  None   Referring MD: Caroll RancherGray, Rebecca, NP   Chief Complaint  Patient presents with  . Follow-up    CHF, AFib     History of Present Illness:    Johnathan Archer is a 48 y.o. male with chronic systolic CHF, nonischemic cardiomyopathy, atrial fibrillation, HTN, diabetes, sleep apnea, hyperthyroidism, chronic kidney disease.  His BP has been difficult to control. He has been non-adherent with medications in the past.  He was referred to Endocrinology for hyperthyroidism in 2017, but we had a hard time getting him to go to the appointment.  He eventually did see Dr. Everardo AllEllison once in 05/2016 and was put on Methimazole.  He was seen in the ED in 01/2017 with recurrent AFib that required DCCV.  He had run out of his Amiodarone.  He was last seen in clinic by me in 02/2017.  He presented to the ED a few days later with recurrent AFlutter.  He was cardioverted but went back into AFlutter.  He was referred to EP but has not been seen since.  Sleep study in 03/2017 was notable for severe OSA with recommendations for CPAP.  He was seen in the ED again in 10/2017 and was noted to be in AFlutter with controlled HR at that time.     [name] returns for follow-up.  He is here alone.  He notes increasing lower extremity swelling.  He does admit to eating increased salt in his diet at times.  He has occasional discomfort in his chest.  He sometimes notices chest discomfort with exertion.  He has not had orthopnea or paroxysmal nocturnal dyspnea.  He denies syncope.  He does note significant fatigue.  He has followed up with primary care and notes that a recent thyroid test was normal.  Prior CV studies:   The following studies were reviewed today:  Echo 7/14 Severe LVH, EF 20-25%, diffuse HK, restrictive  physiology, moderate MR, moderate LAE, moderately reduced RVSF, mild RAE, PASP 32 mmHg, trivial pericardial effusion  LHC 6/04 Left main coronary artery is normal. Left anterior descending normal. Circumflex coronary artery normal. Right coronary artery normal. IMPRESSION: 1. Severe diffuse hypokinesis with normal coronary arteries compatible witha cardiomyopathy. 2. Increased left ventricular end-diastolic pressure. 3. No significant coronary artery disease identified.  Past Medical History:  Diagnosis Date  . Atrial fibrillation with RVR (HCC) 10/2011   lone episode, converted with IV dilt, on coumadin given elevated CHADS2  . Chest pain, atypical 03/06/2015  . Chronic combined systolic and diastolic CHF (congestive heart failure) (HCC)   . ED (erectile dysfunction)   . History of chicken pox   . History of noncompliance with medical treatment 03/06/2015  . Hypertension   . Hypertensive heart disease with heart failure (HCC) 03/28/2016  . Hyperthyroidism 03/15/2016  . Long term current use of anticoagulant therapy 01/14/2013  . Mitral regurgitation    Moderate by echo 07/2011  . Nonischemic cardiomyopathy (HCC) 12/27/2012  . Normocytic anemia 03/15/2016  . Obesity (BMI 30-39.9) 01/21/2014  . OSA (obstructive sleep apnea)   . Paroxysmal atrial fibrillation (HCC) 10/19/2011   lone episode, converted with IV dilt, on coumadin given elevated CHADS2   . Systolic and diastolic CHF, chronic (HCC) 2004   a) Idiopathic dilated CM, thought possibly due to viral  myocarditis.  cath 2004 negative for obstruction, last echo 07/2011 EF 35%. b) Normal coronary arteries by cath in 2004 (when EF was noted at 15%). c) Last EF 20% by echo 12/2012  . Type II diabetes mellitus, uncontrolled (HCC)   . Type II or unspecified type diabetes mellitus without mention of complication, uncontrolled   . Warfarin anticoagulation    Surgical Hx: The patient  has a past surgical history that includes Hernia repair  (2002); Knee surgery; Cardiac catheterization (2004); US ECHOCARDIOGRAPHY (07/2011); and US ECHOCARDIOGRAPHY (12/2012).   Current Medications: Current Meds  Medication Sig  . amiodarone (PACERONE) 200 MG tablet Take 1 tablet (200 mg total) by mouth daily.  . carvedilol (COREG) 25 MG tablet TAKE 1 & 1/2 (ONE & ONE-HALF) TABLETS BY MOUTH TWICE DAILY WITH  MEAL. (NEEDS OFFICE VISIT FOR FURTHER REFILLS 3RD ATTEMPT).  Marland Kitchen glucose blood test strip 1 each by Other route as needed for other. Check cbg in morning and 2 hours after largest meal of day.  . hydrALAZINE (APRESOLINE) 50 MG tablet Take 1 tablet (50 mg total) by mouth 2 (two) times daily. Please schedule overdue appt for future refills 1st attempt. Thank you  . Insulin Pen Needle 31G X 6 MM MISC by Does not apply route. Use for daily insulin injection.  . isosorbide mononitrate (IMDUR) 30 MG 24 hr tablet TAKE 1 TABLET BY MOUTH ONCE DAILY (PLEASE  MAKE  OVERDUE  APPT  FOR  FURTHER  REFILLS  [phone])  . metFORMIN (GLUCOPHAGE) 500 MG tablet Take 1 tablet (500 mg total) by mouth 2 (two) times daily with a meal.  . spironolactone (ALDACTONE) 25 MG tablet Take 1 tablet (25 mg total) by mouth daily.  Carlena Hurl 20 MG TABS tablet TAKE 1 TABLET BY MOUTH ONCE DAILY WITH SUPPER  . [DISCONTINUED] furosemide (LASIX) 40 MG tablet Take 1 tablet (40 mg total) by mouth 2 (two) times daily.  . [DISCONTINUED] lisinopril (PRINIVIL,ZESTRIL) 20 MG tablet Take 1 tablet (20 mg total) by mouth 2 (two) times daily.     Allergies:   Patient has no known allergies.   Social History   Tobacco Use  . Smoking status: Never Smoker  . Smokeless tobacco: Never Used  Substance Use Topics  . Alcohol use: No    Alcohol/week: 0.0 standard drinks    Comment: Occasional 1x/mo  . Drug use: No     Family Hx: The patient's family history includes Diabetes in his mother; Hypertension in his father. There is no history of Stroke, Coronary artery disease, Cancer, Heart attack,  or Thyroid disease.  ROS:   Please see the history of present illness.    Review of Systems  Constitution: Positive for malaise/fatigue.  Cardiovascular: Positive for irregular heartbeat and leg swelling.   All other systems reviewed and are negative.   EKGs/Labs/Other Test Reviewed:    EKG:  EKG is   ordered today.  The ekg ordered today demonstrates AFlutter, HR 80, normal axis, TW inversions V4-6, QTc 521,   Recent Labs: 11/14/2017: BUN 26; Creatinine, Ser 1.55; Hemoglobin 13.0; Platelets 228; Potassium 4.5; Sodium 134   Recent Lipid Panel Lab Results  Component Value Date/Time   CHOL 191 06/26/2014 03:26 PM   TRIG 223.0 (H) 06/26/2014 03:26 PM   HDL 40.30 06/26/2014 03:26 PM   CHOLHDL 5 06/26/2014 03:26 PM   LDLCALC 85 05/12/2014 04:24 PM   LDLDIRECT 111.5 06/26/2014 03:26 PM    Physical Exam:    VS:  BP Marland Kitchen)  140/98   Pulse 80   Ht 5\' 11"  (1.803 m)   Wt 232 lb 6.4 oz (105.4 kg)   SpO2 97%   BMI 32.41 kg/m     Wt Readings from Last 3 Encounters:  08/08/18 232 lb 6.4 oz (105.4 kg)  11/14/17 215 lb (97.5 kg)  03/10/17 220 lb 1.9 oz (99.8 kg)     Physical Exam  Constitutional: He is oriented to person, place, and time. He appears well-developed and well-nourished. No distress.  HENT:  Head: Normocephalic and atraumatic.  Eyes: No scleral icterus.  Neck: JVD present. No thyromegaly present.  Cardiovascular: Normal rate. An irregularly irregular rhythm present.  No murmur heard. Pulmonary/Chest: Effort normal. He has no rales.  Abdominal: Soft. He exhibits distension.  Musculoskeletal:        General: Edema (1-2+ bilat LE edema) present.  Lymphadenopathy:    He has no cervical adenopathy.  Neurological: He is alert and oriented to person, place, and time.  Skin: Skin is warm and dry.  Psychiatric: He has a normal mood and affect.    ASSESSMENT & PLAN:    Chronic combined systolic and diastolic CHF (congestive heart failure) (HCC) EF 20-25 by echo in 2014.   NYHA II-IIIa.  He displays evidence of volume excess on exam today.  We previously tried him on Entresto in the past.  I believe that it was stopped secondary to financial issues.  He is willing to try it again.    -Continue current dose of carvedilol, hydralazine, nitrates, spironolactone  -Stop lisinopril  -After 36 hours, start Entresto 49/51 mg twice daily  -Increase Lasix to 60 mg twice daily  -BMET 1 week  -Obtain recent labs from primary care  -Obtain echocardiogram    Hypertensive heart disease with heart failure (HCC) Blood pressure remains above target.  Change lisinopril to [facility] as noted.  Nonischemic cardiomyopathy (HCC) History of normal cardiac catheterization in 2004.  He does note symptoms of chest discomfort.  I will obtain a Lexiscan Myoview to rule out ischemia.  If this suggests significant ischemia, he will need cardiac catheterization.  Repeat echocardiogram as noted.  Atrial fibrillation/flutter (HCC) Rate is controlled.  He remains on amiodarone.  He also remains on rivaroxaban.  We wanted him to see EP last year but this was never done.  I will refer him to EP for further management of his atrial fibrillation/flutter.  Obtain recent labs from primary care.  If no recent TSH, LFTs, these will be obtained at his next visit.    Hyperthyroidism This has been followed by primary care.  He notes his TSH was recently normal.  Obtain recent labs.  Chest pain Somewhat atypical.  I suspect his chest pain is related to volume excess.  In any event, it has been a long time since he has been assessed for ischemia.  Therefore, I will set him up for a Lexiscan Myoview.  If this is significantly abnormal, he will need a cardiac catheterization.   Dispo:  Return in about 2 weeks (around 08/22/2018) for Close Follow Up, w/ Dr. Elease Hashimoto, or Tereso Newcomer, PA-C.   Medication Adjustments/Labs and Tests Ordered: Current medicines are reviewed at length with the patient today.  Concerns  regarding medicines are outlined above.  Tests Ordered: Orders Placed This Encounter  Procedures  . Basic metabolic panel  . Ambulatory referral to Cardiac Electrophysiology  . MYOCARDIAL PERFUSION IMAGING  . EKG 12-Lead  . ECHOCARDIOGRAM COMPLETE   Medication Changes: Meds ordered this  encounter  Medications  . DISCONTD: sacubitril-valsartan (ENTRESTO) 49-51 MG    Sig: Take 1 tablet by mouth 2 (two) times daily.    Dispense:  60 tablet    Refill:  11  . furosemide (LASIX) 40 MG tablet    Sig: Take 1.5 tablets (60 mg total) by mouth 2 (two) times daily.    Dispense:  180 tablet    Refill:  3  . DISCONTD: sacubitril-valsartan (ENTRESTO) 49-51 MG    Sig: Take 1 tablet by mouth 2 (two) times daily.    Dispense:  60 tablet    Refill:  11    Please Honor Card patient is presenting for Cecelia Byars: [id number]; Alvira Philips: [phone]; RXCPN: [id number]; [id number] ID: [id number]  . DISCONTD: sacubitril-valsartan (ENTRESTO) 49-51 MG    Sig: Take 1 tablet by mouth 2 (two) times daily.    Dispense:  60 tablet    Refill:  11    Please Honor Card patient is presenting for Cecelia Byars: [id number]; Alvira Philips: [phone]; RXCPN: [id number]; [id number] ID: [id number]  . DISCONTD: sacubitril-valsartan (ENTRESTO) 49-51 MG    Sig: Take 1 tablet by mouth 2 (two) times daily.    Dispense:  60 tablet    Refill:  11    Please Honor Card patient is presenting for Cecelia Byars: [id number]; Alvira Philips: [phone]; RXCPN: [id number]; [id number] ID: [id number]  . sacubitril-valsartan (ENTRESTO) 49-51 MG    Sig: Take 1 tablet by mouth 2 (two) times daily.    Dispense:  60 tablet    Refill:  [address], Tereso Newcomer, [place]  08/08/2018 3:33 PM    [facility] [address] [place], [place], [place]  [address] Phone: [phone]; Fax: [phone]

## 2018-08-08 NOTE — Patient Instructions (Addendum)
Medication Instructions:  Your physician has recommended you make the following change in your medication:  1. STOP LISINOPRIL  2. START ENTRESTO 49/51 MG TWICE DAILY.  Friday, February  21ST YOU WOULD TAKE YOUR FIRST DOSE. YOU HAVE BEEN GIVEN AN RX CARD TO GIVE TO YOUR PHARMACY.   3. INCREASE LASIX TO 60 MG (1.5 TABLET) TWICE DAILY.  If you need a refill on your cardiac medications before your next appointment, please call your pharmacy.   Lab work: TO BE DONE IN 1 WEEK: BMET  If you have labs (blood work) drawn today and your tests are completely normal, you will receive your results only by: Marland Kitchen MyChart Message (if you have MyChart) OR . A paper copy in the mail If you have any lab test that is abnormal or we need to change your treatment, we will call you to review the results.  Testing/Procedures: Your physician has requested that you have an echocardiogram. Echocardiography is a painless test that uses sound waves to create images of your heart. It provides your doctor with information about the size and shape of your heart and how well your heart's chambers and valves are working. This procedure takes approximately one hour. There are no restrictions for this procedure.  Your physician has requested that you have a lexiscan myoview. For further information please visit [email or web address] Please follow instruction sheet, as given.  Follow-Up: At [facility], you and your health needs are our priority.  As part of our continuing mission to provide you with exceptional heart care, we have created designated Provider Care Teams.  These Care Teams include your primary Cardiologist (physician) and Advanced Practice Providers (APPs -  Physician Assistants and Nurse Practitioners) who all work together to provide you with the care you need, when you need it. Marland Kitchen You will need a follow up appointment in:  2 WEEKS with Kristeen Miss, MD or Tereso Newcomer, PA-C. Marland Kitchen You have been referred to see  an Electrophysiologist.  Any Other Special Instructions Will Be Listed Below (If Applicable).   Echocardiogram An echocardiogram is a procedure that uses painless sound waves (ultrasound) to produce an image of the heart. Images from an echocardiogram can provide important information about:  Signs of coronary artery disease (CAD).  Aneurysm detection. An aneurysm is a weak or damaged part of an artery wall that bulges out from the normal force of blood pumping through the body.  Heart size and shape. Changes in the size or shape of the heart can be associated with certain conditions, including heart failure, aneurysm, and CAD.  Heart muscle function.  Heart valve function.  Signs of a past heart attack.  Fluid buildup around the heart.  Thickening of the heart muscle.  A tumor or infectious growth around the heart valves. Tell a health care provider about:  Any allergies you have.  All medicines you are taking, including vitamins, herbs, eye drops, creams, and over-the-counter medicines.  Any blood disorders you have.  Any surgeries you have had.  Any medical conditions you have.  Whether you are pregnant or may be pregnant. What are the risks? Generally, this is a safe procedure. However, problems may occur, including:  Allergic reaction to dye (contrast) that may be used during the procedure. What happens before the procedure? No specific preparation is needed. You may eat and drink normally. What happens during the procedure?   An IV tube may be inserted into one of your veins.  You may receive contrast through this  tube. A contrast is an injection that improves the quality of the pictures from your heart.  A gel will be applied to your chest.  A wand-like tool (transducer) will be moved over your chest. The gel will help to transmit the sound waves from the transducer.  The sound waves will harmlessly bounce off of your heart to allow the heart images to be  captured in real-time motion. The images will be recorded on a computer. The procedure may vary among health care providers and hospitals. What happens after the procedure?  You may return to your normal, everyday life, including diet, activities, and medicines, unless your health care provider tells you not to do that. Summary  An echocardiogram is a procedure that uses painless sound waves (ultrasound) to produce an image of the heart.  Images from an echocardiogram can provide important information about the size and shape of your heart, heart muscle function, heart valve function, and fluid buildup around your heart.  You do not need to do anything to prepare before this procedure. You may eat and drink normally.  After the echocardiogram is completed, you may return to your normal, everyday life, unless your health care provider tells you not to do that. This information is not intended to replace advice given to you by your health care provider. Make sure you discuss any questions you have with your health care provider. Document Released: 06/03/2000 Document Revised: 07/09/2016 Document Reviewed: 07/09/2016 Elsevier Interactive Patient Education  2019 [employer].

## 2018-08-14 ENCOUNTER — Telehealth (HOSPITAL_COMMUNITY): Payer: Self-pay | Admitting: *Deleted

## 2018-08-14 NOTE — Telephone Encounter (Signed)
Left message on voicemail per DPR in reference to upcoming appointment scheduled on 08/17/18 at 1015 with detailed instructions given per Myocardial Perfusion Study Information Sheet for the test. LM to arrive 15 minutes early, and that it is imperative to arrive on time for appointment to keep from having the test rescheduled. If you need to cancel or reschedule your appointment, please call the office within 24 hours of your appointment. Failure to do so may result in a cancellation of your appointment, and a $50 no show fee. Phone number given for call back for any questions.  Johnathan Archer, [name] No mychart

## 2018-08-17 ENCOUNTER — Encounter: Payer: Self-pay | Admitting: Physician Assistant

## 2018-08-17 ENCOUNTER — Ambulatory Visit (HOSPITAL_BASED_OUTPATIENT_CLINIC_OR_DEPARTMENT_OTHER): Payer: BLUE CROSS/BLUE SHIELD

## 2018-08-17 ENCOUNTER — Other Ambulatory Visit: Payer: BLUE CROSS/BLUE SHIELD | Admitting: *Deleted

## 2018-08-17 ENCOUNTER — Ambulatory Visit (HOSPITAL_COMMUNITY): Payer: BLUE CROSS/BLUE SHIELD | Attending: Cardiovascular Disease

## 2018-08-17 DIAGNOSIS — I4892 Unspecified atrial flutter: Secondary | ICD-10-CM

## 2018-08-17 DIAGNOSIS — I110 Hypertensive heart disease with heart failure: Secondary | ICD-10-CM

## 2018-08-17 DIAGNOSIS — I5042 Chronic combined systolic (congestive) and diastolic (congestive) heart failure: Secondary | ICD-10-CM

## 2018-08-17 DIAGNOSIS — IMO0002 Reserved for concepts with insufficient information to code with codable children: Secondary | ICD-10-CM

## 2018-08-17 DIAGNOSIS — I4891 Unspecified atrial fibrillation: Secondary | ICD-10-CM

## 2018-08-17 DIAGNOSIS — R079 Chest pain, unspecified: Secondary | ICD-10-CM | POA: Insufficient documentation

## 2018-08-17 DIAGNOSIS — I428 Other cardiomyopathies: Secondary | ICD-10-CM

## 2018-08-17 LAB — MYOCARDIAL PERFUSION IMAGING
LVDIAVOL: 196 mL (ref 62–150)
LVSYSVOL: 131 mL
NUC STRESS TID: 1.06
Peak HR: 90 {beats}/min
Rest HR: 78 {beats}/min
SDS: 0
SRS: 0
SSS: 0

## 2018-08-17 LAB — BASIC METABOLIC PANEL
BUN/Creatinine Ratio: 12 (ref 9–20)
BUN: 16 mg/dL (ref 6–24)
CALCIUM: 9 mg/dL (ref 8.7–10.2)
CHLORIDE: 100 mmol/L (ref 96–106)
CO2: 25 mmol/L (ref 20–29)
Creatinine, Ser: 1.37 mg/dL — ABNORMAL HIGH (ref 0.76–1.27)
GFR calc Af Amer: 70 mL/min/{1.73_m2} (ref >59)
GFR calc non Af Amer: 61 mL/min/{1.73_m2} (ref >59)
GLUCOSE: 236 mg/dL — AB (ref 65–99)
POTASSIUM: 3.6 mmol/L (ref 3.5–5.2)
Sodium: 140 mmol/L (ref 134–144)

## 2018-08-17 LAB — ECHOCARDIOGRAM COMPLETE
Height: 71 in
WEIGHTICAEL: 3712 [oz_av]

## 2018-08-17 MED ORDER — REGADENOSON 0.4 MG/5ML IV SOLN
0.4000 mg | Freq: Once | INTRAVENOUS | Status: AC
  Administered 2018-08-17: 0.4 mg via INTRAVENOUS

## 2018-08-17 MED ORDER — TECHNETIUM TC 99M TETROFOSMIN IV KIT
10.7000 | PACK | Freq: Once | INTRAVENOUS | Status: AC | PRN
  Administered 2018-08-17: 10.7 via INTRAVENOUS
  Filled 2018-08-17: qty 11

## 2018-08-17 MED ORDER — TECHNETIUM TC 99M TETROFOSMIN IV KIT
31.4000 | PACK | Freq: Once | INTRAVENOUS | Status: AC | PRN
  Administered 2018-08-17: 31.4 via INTRAVENOUS
  Filled 2018-08-17: qty 32

## 2018-08-19 ENCOUNTER — Encounter: Payer: Self-pay | Admitting: Physician Assistant

## 2018-08-20 ENCOUNTER — Telehealth: Payer: Self-pay

## 2018-08-20 NOTE — Telephone Encounter (Signed)
-----   Message from Beatrice Lecher, [place] sent at 08/19/2018  9:09 PM EST ----- Creatinine stable.  K+ normal.  Glucose high. Recommendations:  - Continue current medications and follow up as planned.   - FU with PCP for diabetes.  Send copy to PCP.  Tereso Newcomer, PA-C    08/19/2018 9:08 PM

## 2018-08-20 NOTE — Telephone Encounter (Signed)
Notes recorded by Sigurd Sos, RN on 08/20/2018 at 8:13 AM EST The patient has been notified of the result and verbalized understanding. All questions (if any) were answered. Sigurd Sos, RN 08/20/2018 8:13 AM

## 2018-08-21 ENCOUNTER — Other Ambulatory Visit: Payer: Self-pay | Admitting: Physician Assistant

## 2018-08-21 DIAGNOSIS — I110 Hypertensive heart disease with heart failure: Secondary | ICD-10-CM

## 2018-08-23 ENCOUNTER — Other Ambulatory Visit: Payer: Self-pay | Admitting: Physician Assistant

## 2018-08-23 DIAGNOSIS — I5042 Chronic combined systolic (congestive) and diastolic (congestive) heart failure: Secondary | ICD-10-CM

## 2018-08-24 ENCOUNTER — Ambulatory Visit: Payer: BLUE CROSS/BLUE SHIELD | Admitting: Physician Assistant

## 2018-08-24 DIAGNOSIS — R0989 Other specified symptoms and signs involving the circulatory and respiratory systems: Secondary | ICD-10-CM

## 2018-08-24 NOTE — Progress Notes (Deleted)
Cardiology Office Note:    Date:  08/24/2018   ID:  F…Johnathan Archer, DOB 10/04/1970, MRN [id number]  PCP:  Johnathan Rancher, NP  Cardiologist:  Kristeen Miss, MD *** Electrophysiologist:  None   Referring MD: Johnathan Rancher, NP   No chief complaint on file. ***  History of Present Illness:    Johnathan Archer is a 48 y.o. male with chronic systolic CHF, nonischemic cardiomyopathy, atrial fibrillation, difficult to control hypertension, diabetes, sleep apnea, hyperthyroidism, chronic kidney disease, hyperthyroidism.   He is on long term Amiodarone.  He was seen by Endocrinology in 2017 for hyperthyroidism and took Methimazole for a while, then stopped.  He has had recurrent AFib/Flutter.  He was last seen 08/08/18.  ***  Johnathan Archer ***  Prior CV studies:   The following studies were reviewed today:  *** Echo 7/14 Severe LVH, EF 20-25%, diffuse HK, restrictive physiology, moderate MR, moderate LAE, moderately reduced RVSF, mild RAE, PASP 32 mmHg, trivial pericardial effusion  LHC 6/04 Left main coronary artery is normal. Left anterior descending normal. Circumflex coronary artery normal. Right coronary artery normal. IMPRESSION: 1. Severe diffuse hypokinesis with normal coronary arteries compatible witha cardiomyopathy. 2. Increased left ventricular end-diastolic pressure. 3. No significant coronary artery disease identified.  Past Medical History:  Diagnosis Date  . Atrial fibrillation with RVR (HCC) 10/2011   lone episode, converted with IV dilt, on coumadin given elevated CHADS2  . Chest pain 03/06/2015   Myoview 07/2018: EF 33, no ischemia (EF normal by recent echo)  . Chronic combined systolic and diastolic CHF (congestive heart failure) (HCC)    Echo 2014: EF 20-25 // Echo 07/2018: mild LVE, EF 55, normal RVSF, mod LAE, mod MR, mild PI, mild aortic root dilation (38 mm)  . ED (erectile dysfunction)   . History of chicken pox   . History of noncompliance  with medical treatment 03/06/2015  . Hypertension   . Hypertensive heart disease with heart failure (HCC) 03/28/2016  . Hyperthyroidism 03/15/2016  . Long term current use of anticoagulant therapy 01/14/2013  . Mitral regurgitation    Moderate by echo 07/2011  . Nonischemic cardiomyopathy (HCC) 12/27/2012  . Normocytic anemia 03/15/2016  . Obesity (BMI 30-39.9) 01/21/2014  . OSA (obstructive sleep apnea)   . Paroxysmal atrial fibrillation (HCC) 10/19/2011   lone episode, converted with IV dilt, on coumadin given elevated CHADS2   . 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  cath 2004 negative for obstruction, last echo 07/2011 EF 35%. b) Normal coronary arteries by cath in 2004 (when EF was noted at 15%). c) Last EF 20% by echo 12/2012  . Type II diabetes mellitus, uncontrolled (HCC)   . Type II or unspecified type diabetes mellitus without mention of complication, uncontrolled   . Warfarin anticoagulation    Surgical Hx: The patient  has a past surgical history that includes Hernia repair (2002); Knee surgery; Cardiac catheterization (2004); US ECHOCARDIOGRAPHY (07/2011); and US ECHOCARDIOGRAPHY (12/2012).   Current Medications: No outpatient medications have been marked as taking for the 08/24/18 encounter (Appointment) with Tereso Newcomer T, PA-C.     Allergies:   Patient has no known allergies.   Social History   Tobacco Use  . Smoking status: Never Smoker  . Smokeless tobacco: Never Used  Substance Use Topics  . Alcohol use: No    Alcohol/week: 0.0 standard drinks    Comment: Occasional 1x/mo  . Drug use:  No     Family Hx: The patient's family history includes Diabetes in his mother; Hypertension in his father. There is no history of Stroke, Coronary artery disease, Cancer, Heart attack, or Thyroid disease.  ROS:   Please see the history of present illness.    ROS All other systems reviewed and are  negative.   EKGs/Labs/Other Test Reviewed:    EKG:  EKG is *** ordered today.  The ekg ordered today demonstrates ***  Recent Labs: 11/14/2017: Hemoglobin 13.0; Platelets 228 08/17/2018: BUN 16; Creatinine, Ser 1.37; Potassium 3.6; Sodium 140   Recent Lipid Panel Lab Results  Component Value Date/Time   CHOL 191 06/26/2014 03:26 PM   TRIG 223.0 (H) 06/26/2014 03:26 PM   HDL 40.30 06/26/2014 03:26 PM   CHOLHDL 5 06/26/2014 03:26 PM   LDLCALC 85 05/12/2014 04:24 PM   LDLDIRECT 111.5 06/26/2014 03:26 PM    Physical Exam:    VS:  There were no vitals taken for this visit.    Wt Readings from Last 3 Encounters:  08/17/18 232 lb (105.2 kg)  08/08/18 232 lb 6.4 oz (105.4 kg)  11/14/17 215 lb (97.5 kg)     ***Physical Exam  ASSESSMENT & PLAN:    No diagnosis found.***  Chronic combined systolic and diastolic CHF (congestive heart failure) (HCC) EF 20-25 by echo in 2014.  NYHA II-IIIa.  He displays evidence of volume excess on exam today.  We previously tried him on Entresto in the past.  I believe that it was stopped secondary to financial issues.  He is willing to try it again.               -Continue current dose of carvedilol, hydralazine, nitrates, spironolactone             -Stop lisinopril             -After 36 hours, start Entresto 49/51 mg twice daily             -Increase Lasix to 60 mg twice daily             -BMET 1 week             -Obtain recent labs from primary care             -Obtain echocardiogram                          Hypertensive heart disease with heart failure (HCC) Blood pressure remains above target.  Change lisinopril to [facility] as noted.  Nonischemic cardiomyopathy (HCC) History of normal cardiac catheterization in 2004.  He does note symptoms of chest discomfort.  I will obtain a Lexiscan Myoview to rule out ischemia.  If this suggests significant ischemia, he will need cardiac catheterization.  Repeat echocardiogram as noted.  Atrial  fibrillation/flutter (HCC) Rate is controlled.  He remains on amiodarone.  He also remains on rivaroxaban.  We wanted him to see EP last year but this was never done.  I will refer him to EP for further management of his atrial fibrillation/flutter.  Obtain recent labs from primary care.  If no recent TSH, LFTs, these will be obtained at his next visit.    Hyperthyroidism This has been followed by primary care.  He notes his TSH was recently normal.  Obtain recent labs.  Chest pain Somewhat atypical.  I suspect his chest pain is related to volume excess.  In any event, it has  been a long time since he has been assessed for ischemia.  Therefore, I will set him up for a Lexiscan Myoview.  If this is significantly abnormal, he will need a cardiac catheterization.   Dispo:  No follow-ups on file.   Medication Adjustments/Labs and Tests Ordered: Current medicines are reviewed at length with the patient today.  Concerns regarding medicines are outlined above.  Tests Ordered: No orders of the defined types were placed in this encounter.  Medication Changes: No orders of the defined types were placed in this encounter.   Signed, Tereso Newcomer, PA-C  08/24/2018 10:14 AM    [facility] [address] [place], [place], [place]  [address] Phone: [phone]; Fax: [phone]

## 2018-08-27 ENCOUNTER — Encounter: Payer: Self-pay | Admitting: Physician Assistant

## 2018-09-04 ENCOUNTER — Institutional Professional Consult (permissible substitution): Payer: BLUE CROSS/BLUE SHIELD | Admitting: Cardiology

## 2018-09-16 ENCOUNTER — Other Ambulatory Visit: Payer: Self-pay | Admitting: Physician Assistant

## 2018-09-18 ENCOUNTER — Telehealth: Payer: Self-pay | Admitting: Cardiovascular Disease

## 2018-09-18 NOTE — Telephone Encounter (Signed)
Called pt to inform him that he could come by the office to pick up a co-pay card, just ask the CMA downstairs to call and I would bring a card down to him. If advised the pt that if he has any other problems, questions or concerns to call the office. Pt verbalized understanding.

## 2018-09-18 NOTE — Telephone Encounter (Signed)
New Message           Patient is at the pharmacy and would like to know if he can get a $20.00 co-pay card for "Entusto" he states he has recv'd one before and they are asking him to pay $100.00 co-pay. Pls call patient and advise

## 2018-09-27 ENCOUNTER — Other Ambulatory Visit: Payer: Self-pay

## 2018-09-27 ENCOUNTER — Emergency Department (HOSPITAL_COMMUNITY): Payer: BLUE CROSS/BLUE SHIELD

## 2018-09-27 ENCOUNTER — Emergency Department (HOSPITAL_COMMUNITY): Payer: BLUE CROSS/BLUE SHIELD | Admitting: Anesthesiology

## 2018-09-27 ENCOUNTER — Inpatient Hospital Stay (HOSPITAL_COMMUNITY): Payer: BLUE CROSS/BLUE SHIELD

## 2018-09-27 ENCOUNTER — Inpatient Hospital Stay (HOSPITAL_COMMUNITY)
Admission: EM | Admit: 2018-09-27 | Discharge: 2018-10-19 | DRG: 023 | Disposition: E | Payer: BLUE CROSS/BLUE SHIELD | Attending: Neurology | Admitting: Neurology

## 2018-09-27 ENCOUNTER — Other Ambulatory Visit (HOSPITAL_COMMUNITY): Payer: BLUE CROSS/BLUE SHIELD

## 2018-09-27 ENCOUNTER — Encounter (HOSPITAL_COMMUNITY): Admission: EM | Disposition: E | Payer: Self-pay | Source: Home / Self Care | Attending: Neurology

## 2018-09-27 DIAGNOSIS — T8172XA Complication of vein following a procedure, not elsewhere classified, initial encounter: Secondary | ICD-10-CM | POA: Diagnosis not present

## 2018-09-27 DIAGNOSIS — I4891 Unspecified atrial fibrillation: Secondary | ICD-10-CM | POA: Diagnosis not present

## 2018-09-27 DIAGNOSIS — T17908A Unspecified foreign body in respiratory tract, part unspecified causing other injury, initial encounter: Secondary | ICD-10-CM

## 2018-09-27 DIAGNOSIS — J9600 Acute respiratory failure, unspecified whether with hypoxia or hypercapnia: Secondary | ICD-10-CM

## 2018-09-27 DIAGNOSIS — J9621 Acute and chronic respiratory failure with hypoxia: Secondary | ICD-10-CM | POA: Diagnosis present

## 2018-09-27 DIAGNOSIS — I169 Hypertensive crisis, unspecified: Secondary | ICD-10-CM | POA: Diagnosis not present

## 2018-09-27 DIAGNOSIS — G935 Compression of brain: Secondary | ICD-10-CM | POA: Diagnosis present

## 2018-09-27 DIAGNOSIS — J189 Pneumonia, unspecified organism: Secondary | ICD-10-CM | POA: Diagnosis not present

## 2018-09-27 DIAGNOSIS — E1159 Type 2 diabetes mellitus with other circulatory complications: Secondary | ICD-10-CM | POA: Diagnosis not present

## 2018-09-27 DIAGNOSIS — Z833 Family history of diabetes mellitus: Secondary | ICD-10-CM

## 2018-09-27 DIAGNOSIS — I428 Other cardiomyopathies: Secondary | ICD-10-CM | POA: Diagnosis present

## 2018-09-27 DIAGNOSIS — Y95 Nosocomial condition: Secondary | ICD-10-CM | POA: Diagnosis not present

## 2018-09-27 DIAGNOSIS — I809 Phlebitis and thrombophlebitis of unspecified site: Secondary | ICD-10-CM | POA: Diagnosis present

## 2018-09-27 DIAGNOSIS — I472 Ventricular tachycardia: Secondary | ICD-10-CM | POA: Diagnosis not present

## 2018-09-27 DIAGNOSIS — I130 Hypertensive heart and chronic kidney disease with heart failure and stage 1 through stage 4 chronic kidney disease, or unspecified chronic kidney disease: Secondary | ICD-10-CM | POA: Diagnosis present

## 2018-09-27 DIAGNOSIS — I6340 Cerebral infarction due to embolism of unspecified cerebral artery: Secondary | ICD-10-CM | POA: Diagnosis not present

## 2018-09-27 DIAGNOSIS — I63511 Cerebral infarction due to unspecified occlusion or stenosis of right middle cerebral artery: Secondary | ICD-10-CM | POA: Diagnosis not present

## 2018-09-27 DIAGNOSIS — E1151 Type 2 diabetes mellitus with diabetic peripheral angiopathy without gangrene: Secondary | ICD-10-CM | POA: Diagnosis present

## 2018-09-27 DIAGNOSIS — J9690 Respiratory failure, unspecified, unspecified whether with hypoxia or hypercapnia: Secondary | ICD-10-CM

## 2018-09-27 DIAGNOSIS — E870 Hyperosmolality and hypernatremia: Secondary | ICD-10-CM | POA: Diagnosis not present

## 2018-09-27 DIAGNOSIS — J690 Pneumonitis due to inhalation of food and vomit: Secondary | ICD-10-CM | POA: Diagnosis not present

## 2018-09-27 DIAGNOSIS — I161 Hypertensive emergency: Secondary | ICD-10-CM | POA: Diagnosis present

## 2018-09-27 DIAGNOSIS — I63411 Cerebral infarction due to embolism of right middle cerebral artery: Principal | ICD-10-CM

## 2018-09-27 DIAGNOSIS — J9622 Acute and chronic respiratory failure with hypercapnia: Secondary | ICD-10-CM | POA: Diagnosis present

## 2018-09-27 DIAGNOSIS — I6601 Occlusion and stenosis of right middle cerebral artery: Secondary | ICD-10-CM | POA: Diagnosis not present

## 2018-09-27 DIAGNOSIS — I639 Cerebral infarction, unspecified: Secondary | ICD-10-CM | POA: Diagnosis present

## 2018-09-27 DIAGNOSIS — E785 Hyperlipidemia, unspecified: Secondary | ICD-10-CM | POA: Diagnosis not present

## 2018-09-27 DIAGNOSIS — G9340 Encephalopathy, unspecified: Secondary | ICD-10-CM | POA: Diagnosis not present

## 2018-09-27 DIAGNOSIS — Z7189 Other specified counseling: Secondary | ICD-10-CM | POA: Diagnosis not present

## 2018-09-27 DIAGNOSIS — I5041 Acute combined systolic (congestive) and diastolic (congestive) heart failure: Secondary | ICD-10-CM | POA: Diagnosis not present

## 2018-09-27 DIAGNOSIS — Z515 Encounter for palliative care: Secondary | ICD-10-CM

## 2018-09-27 DIAGNOSIS — Z9289 Personal history of other medical treatment: Secondary | ICD-10-CM

## 2018-09-27 DIAGNOSIS — D696 Thrombocytopenia, unspecified: Secondary | ICD-10-CM | POA: Diagnosis not present

## 2018-09-27 DIAGNOSIS — E872 Acidosis: Secondary | ICD-10-CM | POA: Diagnosis present

## 2018-09-27 DIAGNOSIS — Z20828 Contact with and (suspected) exposure to other viral communicable diseases: Secondary | ICD-10-CM | POA: Diagnosis present

## 2018-09-27 DIAGNOSIS — G8194 Hemiplegia, unspecified affecting left nondominant side: Secondary | ICD-10-CM | POA: Diagnosis present

## 2018-09-27 DIAGNOSIS — I469 Cardiac arrest, cause unspecified: Secondary | ICD-10-CM | POA: Diagnosis not present

## 2018-09-27 DIAGNOSIS — I10 Essential (primary) hypertension: Secondary | ICD-10-CM | POA: Diagnosis present

## 2018-09-27 DIAGNOSIS — I509 Heart failure, unspecified: Secondary | ICD-10-CM | POA: Diagnosis not present

## 2018-09-27 DIAGNOSIS — Z794 Long term (current) use of insulin: Secondary | ICD-10-CM | POA: Diagnosis not present

## 2018-09-27 DIAGNOSIS — I340 Nonrheumatic mitral (valve) insufficiency: Secondary | ICD-10-CM | POA: Diagnosis present

## 2018-09-27 DIAGNOSIS — R739 Hyperglycemia, unspecified: Secondary | ICD-10-CM | POA: Diagnosis not present

## 2018-09-27 DIAGNOSIS — T884XXA Failed or difficult intubation, initial encounter: Secondary | ICD-10-CM | POA: Diagnosis not present

## 2018-09-27 DIAGNOSIS — R414 Neurologic neglect syndrome: Secondary | ICD-10-CM | POA: Diagnosis present

## 2018-09-27 DIAGNOSIS — E875 Hyperkalemia: Secondary | ICD-10-CM | POA: Diagnosis not present

## 2018-09-27 DIAGNOSIS — Z66 Do not resuscitate: Secondary | ICD-10-CM | POA: Diagnosis not present

## 2018-09-27 DIAGNOSIS — Z978 Presence of other specified devices: Secondary | ICD-10-CM

## 2018-09-27 DIAGNOSIS — R29810 Facial weakness: Secondary | ICD-10-CM | POA: Diagnosis present

## 2018-09-27 DIAGNOSIS — R29722 NIHSS score 22: Secondary | ICD-10-CM | POA: Diagnosis present

## 2018-09-27 DIAGNOSIS — I480 Paroxysmal atrial fibrillation: Secondary | ICD-10-CM | POA: Diagnosis not present

## 2018-09-27 DIAGNOSIS — E0590 Thyrotoxicosis, unspecified without thyrotoxic crisis or storm: Secondary | ICD-10-CM | POA: Diagnosis present

## 2018-09-27 DIAGNOSIS — Z8249 Family history of ischemic heart disease and other diseases of the circulatory system: Secondary | ICD-10-CM

## 2018-09-27 DIAGNOSIS — H5500 Unspecified nystagmus: Secondary | ICD-10-CM | POA: Diagnosis present

## 2018-09-27 DIAGNOSIS — Z7401 Bed confinement status: Secondary | ICD-10-CM

## 2018-09-27 DIAGNOSIS — N182 Chronic kidney disease, stage 2 (mild): Secondary | ICD-10-CM | POA: Diagnosis present

## 2018-09-27 DIAGNOSIS — G936 Cerebral edema: Secondary | ICD-10-CM | POA: Diagnosis present

## 2018-09-27 DIAGNOSIS — E669 Obesity, unspecified: Secondary | ICD-10-CM | POA: Diagnosis present

## 2018-09-27 DIAGNOSIS — R0603 Acute respiratory distress: Secondary | ICD-10-CM

## 2018-09-27 DIAGNOSIS — H510 Palsy (spasm) of conjugate gaze: Secondary | ICD-10-CM | POA: Diagnosis present

## 2018-09-27 DIAGNOSIS — J9601 Acute respiratory failure with hypoxia: Secondary | ICD-10-CM | POA: Diagnosis not present

## 2018-09-27 DIAGNOSIS — R1310 Dysphagia, unspecified: Secondary | ICD-10-CM | POA: Diagnosis present

## 2018-09-27 DIAGNOSIS — E1122 Type 2 diabetes mellitus with diabetic chronic kidney disease: Secondary | ICD-10-CM | POA: Diagnosis present

## 2018-09-27 DIAGNOSIS — S60521A Blister (nonthermal) of right hand, initial encounter: Secondary | ICD-10-CM | POA: Diagnosis not present

## 2018-09-27 DIAGNOSIS — M7989 Other specified soft tissue disorders: Secondary | ICD-10-CM | POA: Diagnosis not present

## 2018-09-27 DIAGNOSIS — D649 Anemia, unspecified: Secondary | ICD-10-CM | POA: Diagnosis present

## 2018-09-27 DIAGNOSIS — L0390 Cellulitis, unspecified: Secondary | ICD-10-CM | POA: Diagnosis not present

## 2018-09-27 DIAGNOSIS — Z91199 Patient's noncompliance with other medical treatment and regimen due to unspecified reason: Secondary | ICD-10-CM

## 2018-09-27 DIAGNOSIS — I5042 Chronic combined systolic (congestive) and diastolic (congestive) heart failure: Secondary | ICD-10-CM | POA: Diagnosis present

## 2018-09-27 DIAGNOSIS — G4733 Obstructive sleep apnea (adult) (pediatric): Secondary | ICD-10-CM | POA: Diagnosis present

## 2018-09-27 DIAGNOSIS — R4020 Unspecified coma: Secondary | ICD-10-CM | POA: Diagnosis not present

## 2018-09-27 DIAGNOSIS — Z79899 Other long term (current) drug therapy: Secondary | ICD-10-CM

## 2018-09-27 DIAGNOSIS — R339 Retention of urine, unspecified: Secondary | ICD-10-CM | POA: Diagnosis not present

## 2018-09-27 DIAGNOSIS — E1165 Type 2 diabetes mellitus with hyperglycemia: Secondary | ICD-10-CM | POA: Diagnosis present

## 2018-09-27 DIAGNOSIS — Z9911 Dependence on respirator [ventilator] status: Secondary | ICD-10-CM | POA: Diagnosis not present

## 2018-09-27 DIAGNOSIS — E11649 Type 2 diabetes mellitus with hypoglycemia without coma: Secondary | ICD-10-CM | POA: Diagnosis not present

## 2018-09-27 DIAGNOSIS — R509 Fever, unspecified: Secondary | ICD-10-CM | POA: Diagnosis not present

## 2018-09-27 DIAGNOSIS — R0602 Shortness of breath: Secondary | ICD-10-CM

## 2018-09-27 DIAGNOSIS — R1312 Dysphagia, oropharyngeal phase: Secondary | ICD-10-CM | POA: Diagnosis not present

## 2018-09-27 DIAGNOSIS — Z4659 Encounter for fitting and adjustment of other gastrointestinal appliance and device: Secondary | ICD-10-CM

## 2018-09-27 DIAGNOSIS — Z6832 Body mass index (BMI) 32.0-32.9, adult: Secondary | ICD-10-CM

## 2018-09-27 DIAGNOSIS — D72829 Elevated white blood cell count, unspecified: Secondary | ICD-10-CM | POA: Diagnosis not present

## 2018-09-27 DIAGNOSIS — Z7901 Long term (current) use of anticoagulants: Secondary | ICD-10-CM

## 2018-09-27 DIAGNOSIS — IMO0002 Reserved for concepts with insufficient information to code with codable children: Secondary | ICD-10-CM | POA: Diagnosis present

## 2018-09-27 DIAGNOSIS — N179 Acute kidney failure, unspecified: Secondary | ICD-10-CM | POA: Diagnosis present

## 2018-09-27 DIAGNOSIS — W449XXA Unspecified foreign body entering into or through a natural orifice, initial encounter: Secondary | ICD-10-CM

## 2018-09-27 DIAGNOSIS — Z9119 Patient's noncompliance with other medical treatment and regimen: Secondary | ICD-10-CM

## 2018-09-27 HISTORY — PX: RADIOLOGY WITH ANESTHESIA: SHX6223

## 2018-09-27 HISTORY — PX: IR CT HEAD LTD: IMG2386

## 2018-09-27 HISTORY — PX: IR ANGIO VERTEBRAL SEL SUBCLAVIAN INNOMINATE UNI R MOD SED: IMG5365

## 2018-09-27 HISTORY — PX: IR PERCUTANEOUS ART THROMBECTOMY/INFUSION INTRACRANIAL INC DIAG ANGIO: IMG6087

## 2018-09-27 LAB — POCT I-STAT 7, (LYTES, BLD GAS, ICA,H+H)
Acid-Base Excess: 3 mmol/L — ABNORMAL HIGH (ref 0.0–2.0)
Acid-base deficit: 1 mmol/L (ref 0.0–2.0)
Bicarbonate: 25.9 mmol/L (ref 20.0–28.0)
Bicarbonate: 26.9 mmol/L (ref 20.0–28.0)
Calcium, Ion: 1.24 mmol/L (ref 1.15–1.40)
Calcium, Ion: 1.18 mmol/L (ref 1.15–1.40)
HCT: 31 % — ABNORMAL LOW (ref 39.0–52.0)
HCT: 33 % — ABNORMAL LOW (ref 39.0–52.0)
Hemoglobin: 10.5 g/dL — ABNORMAL LOW (ref 13.0–17.0)
Hemoglobin: 11.2 g/dL — ABNORMAL LOW (ref 13.0–17.0)
O2 Saturation: 100 %
O2 Saturation: 100 %
Patient temperature: 97.5
Patient temperature: 97.6
Potassium: 3.4 mmol/L — ABNORMAL LOW (ref 3.5–5.1)
Potassium: 3.7 mmol/L (ref 3.5–5.1)
Sodium: 141 mmol/L (ref 135–145)
Sodium: 144 mmol/L (ref 135–145)
TCO2: 27 mmol/L (ref 22–32)
TCO2: 28 mmol/L (ref 22–32)
pCO2 arterial: 48.5 mmHg — ABNORMAL HIGH (ref 32.0–48.0)
pCO2 arterial: 37.8 mmHg (ref 32.0–48.0)
pH, Arterial: 7.332 — ABNORMAL LOW (ref 7.350–7.450)
pH, Arterial: 7.458 — ABNORMAL HIGH (ref 7.350–7.450)
pO2, Arterial: 183 mmHg — ABNORMAL HIGH (ref 83.0–108.0)
pO2, Arterial: 207 mmHg — ABNORMAL HIGH (ref 83.0–108.0)

## 2018-09-27 LAB — COMPREHENSIVE METABOLIC PANEL
ALT: 22 U/L (ref 0–44)
AST: 19 U/L (ref 15–41)
Albumin: 3.3 g/dL — ABNORMAL LOW (ref 3.5–5.0)
Alkaline Phosphatase: 50 U/L (ref 38–126)
Anion gap: 13 (ref 5–15)
BUN: 25 mg/dL — ABNORMAL HIGH (ref 6–20)
CO2: 21 mmol/L — ABNORMAL LOW (ref 22–32)
Calcium: 9.2 mg/dL (ref 8.9–10.3)
Chloride: 100 mmol/L (ref 98–111)
Creatinine, Ser: 1.42 mg/dL — ABNORMAL HIGH (ref 0.61–1.24)
GFR calc Af Amer: >60 mL/min (ref >60)
GFR calc non Af Amer: 58 mL/min — ABNORMAL LOW (ref >60)
Glucose, Bld: 434 mg/dL — ABNORMAL HIGH (ref 70–99)
Potassium: 4.6 mmol/L (ref 3.5–5.1)
Sodium: 134 mmol/L — ABNORMAL LOW (ref 135–145)
Total Bilirubin: 1.7 mg/dL — ABNORMAL HIGH (ref 0.3–1.2)
Total Protein: 7.3 g/dL (ref 6.5–8.1)

## 2018-09-27 LAB — DIFFERENTIAL
Abs Immature Granulocytes: 0.01 10*3/uL (ref 0.00–0.07)
Basophils Absolute: 0 10*3/uL (ref 0.0–0.1)
Basophils Relative: 0 %
Eosinophils Absolute: 0.1 10*3/uL (ref 0.0–0.5)
Eosinophils Relative: 2 %
Immature Granulocytes: 0 %
Lymphocytes Relative: 12 %
Lymphs Abs: 0.9 10*3/uL (ref 0.7–4.0)
Monocytes Absolute: 0.5 10*3/uL (ref 0.1–1.0)
Monocytes Relative: 7 %
Neutro Abs: 5.8 10*3/uL (ref 1.7–7.7)
Neutrophils Relative %: 79 %

## 2018-09-27 LAB — APTT: aPTT: 36 seconds (ref 24–36)

## 2018-09-27 LAB — I-STAT CREATININE, ED: Creatinine, Ser: 1.3 mg/dL — ABNORMAL HIGH (ref 0.61–1.24)

## 2018-09-27 LAB — CBC
HCT: 38.7 % — ABNORMAL LOW (ref 39.0–52.0)
Hemoglobin: 12.5 g/dL — ABNORMAL LOW (ref 13.0–17.0)
MCH: 31.6 pg (ref 26.0–34.0)
MCHC: 32.3 g/dL (ref 30.0–36.0)
MCV: 97.7 fL (ref 80.0–100.0)
Platelets: 241 10*3/uL (ref 150–400)
RBC: 3.96 MIL/uL — ABNORMAL LOW (ref 4.22–5.81)
RDW: 13.2 % (ref 11.5–15.5)
WBC: 7.3 10*3/uL (ref 4.0–10.5)
nRBC: 0 % (ref 0.0–0.2)

## 2018-09-27 LAB — GLUCOSE, CAPILLARY
Glucose-Capillary: 128 mg/dL — ABNORMAL HIGH (ref 70–99)
Glucose-Capillary: 169 mg/dL — ABNORMAL HIGH (ref 70–99)
Glucose-Capillary: 48 mg/dL — ABNORMAL LOW (ref 70–99)
Glucose-Capillary: 54 mg/dL — ABNORMAL LOW (ref 70–99)
Glucose-Capillary: 75 mg/dL (ref 70–99)

## 2018-09-27 LAB — PROTIME-INR
INR: 2.6 — ABNORMAL HIGH (ref 0.8–1.2)
Prothrombin Time: 27.4 seconds — ABNORMAL HIGH (ref 11.4–15.2)

## 2018-09-27 LAB — ETHANOL: Alcohol, Ethyl (B): <10 mg/dL (ref <10)

## 2018-09-27 LAB — TRIGLYCERIDES: Triglycerides: 50 mg/dL (ref <150)

## 2018-09-27 LAB — MRSA PCR SCREENING: MRSA by PCR: NEGATIVE

## 2018-09-27 LAB — CBG MONITORING, ED: Glucose-Capillary: 383 mg/dL — ABNORMAL HIGH (ref 70–99)

## 2018-09-27 SURGERY — IR WITH ANESTHESIA
Anesthesia: General

## 2018-09-27 MED ORDER — NITROGLYCERIN 1 MG/10 ML FOR IR/CATH LAB
INTRA_ARTERIAL | Status: AC
  Filled 2018-09-27: qty 10

## 2018-09-27 MED ORDER — SODIUM CHLORIDE 0.9 % IV SOLN
INTRAVENOUS | Status: DC
  Administered 2018-09-27 (×3): via INTRAVENOUS

## 2018-09-27 MED ORDER — SODIUM CHLORIDE 0.9 % IV SOLN
INTRAVENOUS | Status: DC
  Administered 2018-09-27: 17:00:00 1 mL via INTRAVENOUS

## 2018-09-27 MED ORDER — EPHEDRINE SULFATE-NACL 50-0.9 MG/10ML-% IV SOSY
PREFILLED_SYRINGE | INTRAVENOUS | Status: DC | PRN
  Administered 2018-09-27: 10 mg via INTRAVENOUS
  Administered 2018-09-27: 5 mg via INTRAVENOUS
  Administered 2018-09-27: 10 mg via INTRAVENOUS
  Administered 2018-09-27: 5 mg via INTRAVENOUS

## 2018-09-27 MED ORDER — NITROGLYCERIN 1 MG/10 ML FOR IR/CATH LAB
INTRA_ARTERIAL | Status: AC | PRN
  Administered 2018-09-27 (×6): 25 ug via INTRA_ARTERIAL

## 2018-09-27 MED ORDER — ALBUMIN HUMAN 5 % IV SOLN
INTRAVENOUS | Status: DC | PRN
  Administered 2018-09-27: 14:00:00 via INTRAVENOUS

## 2018-09-27 MED ORDER — CLOPIDOGREL BISULFATE 300 MG PO TABS
ORAL_TABLET | ORAL | Status: AC
  Filled 2018-09-27: qty 1

## 2018-09-27 MED ORDER — ORAL CARE MOUTH RINSE
15.0000 mL | OROMUCOSAL | Status: DC
  Administered 2018-09-27 – 2018-09-28 (×8): 15 mL via OROMUCOSAL

## 2018-09-27 MED ORDER — TIROFIBAN HCL IN NACL 5-0.9 MG/100ML-% IV SOLN
INTRAVENOUS | Status: AC
  Filled 2018-09-27: qty 100

## 2018-09-27 MED ORDER — LIDOCAINE HCL 1 % IJ SOLN
INTRAMUSCULAR | Status: AC
  Filled 2018-09-27: qty 20

## 2018-09-27 MED ORDER — SODIUM CHLORIDE 0.9 % IV SOLN
INTRAVENOUS | Status: DC | PRN
  Administered 2018-09-27: 20 ug/min via INTRAVENOUS

## 2018-09-27 MED ORDER — DEXTROSE 50 % IV SOLN
INTRAVENOUS | Status: DC | PRN
  Administered 2018-09-27: 25 mL via INTRAVENOUS

## 2018-09-27 MED ORDER — ROCURONIUM BROMIDE 50 MG/5ML IV SOSY
PREFILLED_SYRINGE | INTRAVENOUS | Status: DC | PRN
  Administered 2018-09-27: 50 mg via INTRAVENOUS

## 2018-09-27 MED ORDER — INSULIN ASPART 100 UNIT/ML ~~LOC~~ SOLN
SUBCUTANEOUS | Status: DC | PRN
  Administered 2018-09-27: 10 [IU] via SUBCUTANEOUS

## 2018-09-27 MED ORDER — PANTOPRAZOLE SODIUM 40 MG IV SOLR
40.0000 mg | INTRAVENOUS | Status: DC
  Administered 2018-09-27: 20:00:00 40 mg via INTRAVENOUS
  Filled 2018-09-27: qty 40

## 2018-09-27 MED ORDER — CHLORHEXIDINE GLUCONATE 0.12% ORAL RINSE (MEDLINE KIT)
15.0000 mL | Freq: Two times a day (BID) | OROMUCOSAL | Status: DC
  Administered 2018-09-27 – 2018-09-28 (×2): 15 mL via OROMUCOSAL

## 2018-09-27 MED ORDER — FENTANYL CITRATE (PF) 100 MCG/2ML IJ SOLN
50.0000 ug | INTRAMUSCULAR | Status: DC | PRN
  Administered 2018-09-27: 50 ug via INTRAVENOUS
  Filled 2018-09-27 (×2): qty 2

## 2018-09-27 MED ORDER — PROPOFOL 1000 MG/100ML IV EMUL
5.0000 ug/kg/min | INTRAVENOUS | Status: DC
  Administered 2018-09-27: 14:00:00 30 ug/kg/min via INTRAVENOUS
  Filled 2018-09-27: qty 100

## 2018-09-27 MED ORDER — INSULIN ASPART 100 UNIT/ML ~~LOC~~ SOLN
0.0000 [IU] | SUBCUTANEOUS | Status: DC
  Administered 2018-09-27 – 2018-09-28 (×4): 3 [IU] via SUBCUTANEOUS
  Administered 2018-09-28: 12:00:00 5 [IU] via SUBCUTANEOUS
  Administered 2018-09-28 (×2): 3 [IU] via SUBCUTANEOUS
  Administered 2018-09-29: 5 [IU] via SUBCUTANEOUS
  Administered 2018-09-29: 04:00:00 3 [IU] via SUBCUTANEOUS
  Administered 2018-09-29 (×2): 5 [IU] via SUBCUTANEOUS
  Administered 2018-09-29: 09:00:00 3 [IU] via SUBCUTANEOUS
  Administered 2018-09-30: 05:00:00 8 [IU] via SUBCUTANEOUS
  Administered 2018-09-30: 5 [IU] via SUBCUTANEOUS

## 2018-09-27 MED ORDER — MIDAZOLAM HCL 2 MG/2ML IJ SOLN
5.0000 mg | Freq: Once | INTRAMUSCULAR | Status: DC
  Filled 2018-09-27: qty 6

## 2018-09-27 MED ORDER — INSULIN ASPART 100 UNIT/ML ~~LOC~~ SOLN
SUBCUTANEOUS | Status: AC
  Filled 2018-09-27: qty 1

## 2018-09-27 MED ORDER — CLEVIDIPINE BUTYRATE 0.5 MG/ML IV EMUL
0.0000 mg/h | INTRAVENOUS | Status: DC
  Administered 2018-09-28: 09:00:00 12 mg/h via INTRAVENOUS
  Administered 2018-09-28 (×2): 9 mg/h via INTRAVENOUS
  Administered 2018-09-28: 03:00:00 2 mg/h via INTRAVENOUS
  Administered 2018-09-28: 07:00:00 8 mg/h via INTRAVENOUS
  Filled 2018-09-27 (×6): qty 50

## 2018-09-27 MED ORDER — VECURONIUM BROMIDE 10 MG IV SOLR
10.0000 mg | Freq: Once | INTRAVENOUS | Status: AC
  Administered 2018-09-27: 14:00:00 10 mg via INTRAVENOUS

## 2018-09-27 MED ORDER — DOCUSATE SODIUM 50 MG/5ML PO LIQD: 100.0000 mg | Freq: Two times a day (BID) | ORAL | Status: DC | PRN

## 2018-09-27 MED ORDER — IOHEXOL 350 MG/ML SOLN
75.0000 mL | Freq: Once | INTRAVENOUS | Status: AC | PRN
  Administered 2018-09-27: 13:00:00 75 mL via INTRAVENOUS

## 2018-09-27 MED ORDER — ETOMIDATE 2 MG/ML IV SOLN
INTRAVENOUS | Status: AC | PRN
  Administered 2018-09-27: 20 mg via INTRAVENOUS

## 2018-09-27 MED ORDER — CEFAZOLIN SODIUM-DEXTROSE 2-3 GM-%(50ML) IV SOLR
INTRAVENOUS | Status: DC | PRN
  Administered 2018-09-27: 2 g via INTRAVENOUS

## 2018-09-27 MED ORDER — ACETAMINOPHEN 325 MG PO TABS: 650.0000 mg | ORAL_TABLET | ORAL | Status: DC | PRN

## 2018-09-27 MED ORDER — ACETAMINOPHEN 160 MG/5ML PO SOLN: 650.0000 mg | ORAL | Status: DC | PRN

## 2018-09-27 MED ORDER — PROPOFOL 1000 MG/100ML IV EMUL: 0.0000 ug/kg/min | INTRAVENOUS | Status: DC

## 2018-09-27 MED ORDER — LACTATED RINGERS IV SOLN
INTRAVENOUS | Status: DC | PRN
  Administered 2018-09-27: 14:00:00 via INTRAVENOUS

## 2018-09-27 MED ORDER — VECURONIUM BROMIDE 10 MG IV SOLR
INTRAVENOUS | Status: AC
  Filled 2018-09-27: qty 10

## 2018-09-27 MED ORDER — ALBUTEROL SULFATE (2.5 MG/3ML) 0.083% IN NEBU
2.5000 mg | INHALATION_SOLUTION | RESPIRATORY_TRACT | Status: DC | PRN
  Filled 2018-09-27: qty 3

## 2018-09-27 MED ORDER — DEXTROSE 50 % IV SOLN
25.0000 g | INTRAVENOUS | Status: AC
  Administered 2018-09-27: 20:00:00 25 g via INTRAVENOUS

## 2018-09-27 MED ORDER — BISACODYL 10 MG RE SUPP: 10.0000 mg | Freq: Every day | RECTAL | Status: DC | PRN

## 2018-09-27 MED ORDER — SUCCINYLCHOLINE CHLORIDE 20 MG/ML IJ SOLN
INTRAMUSCULAR | Status: AC | PRN
  Administered 2018-09-27: 150 mg via INTRAVENOUS

## 2018-09-27 MED ORDER — DEXTROSE 50 % IV SOLN
INTRAVENOUS | Status: AC
  Filled 2018-09-27: qty 50

## 2018-09-27 MED ORDER — LABETALOL HCL 5 MG/ML IV SOLN: 10.0000 mg | INTRAVENOUS | Status: DC | PRN

## 2018-09-27 MED ORDER — STROKE: EARLY STAGES OF RECOVERY BOOK
Freq: Once | Status: AC
  Administered 2018-09-27: 18:00:00

## 2018-09-27 MED ORDER — DEXTROSE 50 % IV SOLN
INTRAVENOUS | Status: AC
  Administered 2018-09-27: 25 g via INTRAVENOUS
  Filled 2018-09-27: qty 50

## 2018-09-27 MED ORDER — GLYCOPYRROLATE 0.2 MG/ML IJ SOLN
INTRAMUSCULAR | Status: DC | PRN
  Administered 2018-09-27: 0.1 mg via INTRAVENOUS

## 2018-09-27 MED ORDER — FENTANYL CITRATE (PF) 100 MCG/2ML IJ SOLN: 50.0000 ug | INTRAMUSCULAR | Status: DC | PRN

## 2018-09-27 MED ORDER — GLYCOPYRROLATE PF 0.2 MG/ML IJ SOSY
PREFILLED_SYRINGE | INTRAMUSCULAR | Status: DC | PRN
  Administered 2018-09-27: .2 mg via INTRAVENOUS

## 2018-09-27 MED ORDER — HYDRALAZINE HCL 20 MG/ML IJ SOLN
10.0000 mg | INTRAMUSCULAR | Status: DC | PRN
  Administered 2018-09-27: 23:00:00 10 mg via INTRAVENOUS
  Administered 2018-09-27 – 2018-09-28 (×2): 20 mg via INTRAVENOUS
  Administered 2018-09-29 – 2018-09-30 (×2): 40 mg via INTRAVENOUS
  Filled 2018-09-27 (×2): qty 1
  Filled 2018-09-27: qty 2
  Filled 2018-09-27: qty 1
  Filled 2018-09-27: qty 2

## 2018-09-27 MED ORDER — ASPIRIN 325 MG PO TABS
ORAL_TABLET | ORAL | Status: AC
  Filled 2018-09-27: qty 1

## 2018-09-27 MED ORDER — EPTIFIBATIDE 20 MG/10ML IV SOLN
INTRAVENOUS | Status: AC
  Filled 2018-09-27: qty 10

## 2018-09-27 MED ORDER — ACETAMINOPHEN 325 MG PO TABS
650.0000 mg | ORAL_TABLET | ORAL | Status: DC | PRN
  Administered 2018-09-30 – 2018-10-10 (×2): 650 mg via ORAL
  Filled 2018-09-27 (×2): qty 2

## 2018-09-27 MED ORDER — CEFAZOLIN SODIUM-DEXTROSE 2-4 GM/100ML-% IV SOLN
INTRAVENOUS | Status: AC
  Filled 2018-09-27: qty 100

## 2018-09-27 MED ORDER — IOHEXOL 300 MG/ML  SOLN
300.0000 mL | Freq: Once | INTRAMUSCULAR | Status: AC | PRN
  Administered 2018-09-27: 100 mL via INTRAVENOUS

## 2018-09-27 MED ORDER — ACETAMINOPHEN 650 MG RE SUPP
650.0000 mg | RECTAL | Status: DC | PRN
  Administered 2018-09-28: 650 mg via RECTAL
  Filled 2018-09-27: qty 1

## 2018-09-27 MED ORDER — ACETAMINOPHEN 160 MG/5ML PO SOLN
650.0000 mg | ORAL | Status: DC | PRN
  Administered 2018-09-29 – 2018-10-11 (×22): 650 mg
  Filled 2018-09-27 (×23): qty 20.3

## 2018-09-27 MED ORDER — FENTANYL CITRATE (PF) 100 MCG/2ML IJ SOLN
150.0000 ug | Freq: Once | INTRAMUSCULAR | Status: DC
  Filled 2018-09-27: qty 4

## 2018-09-27 MED ORDER — TICAGRELOR 90 MG PO TABS
ORAL_TABLET | ORAL | Status: AC
  Filled 2018-09-27: qty 2

## 2018-09-27 MED ORDER — ACETAMINOPHEN 650 MG RE SUPP: 650.0000 mg | RECTAL | Status: DC | PRN

## 2018-09-27 NOTE — Consult Note (Signed)
NAME:  Johnathan Archer, MRN:  [id number], DOB:  Jul 22, 1970, LOS: 1 ADMISSION DATE:  Oct 27, 2018, CONSULTATION DATE:  2018/10/27 REFERRING MD:  Dr. Loni Beckwith, CHIEF COMPLAINT:  CVA  Brief History   48 yoM, LSW 1000 am, presenting with left sided paralysis and right gaze found to have acute right MCA M1 occlusion.  Difficult intubation for airway protection.  Taken for EVR with successful revascularization.    History of present illness   HPI obtained from medical chart review as patient is intubated/ sedated   48 year old male with history of Afib on xarelto, chronic systolic / dystolic HF, DMT2, HTN, MR, NICM, anemia, obesity, and OSA, last seen well around 1000 by coworkers found wedged between sink and bathroom stall at work.  Presented as code stroke with left sided paralysis and right gaze.  Hypertensive at 221/ 145.  Found on CT to have acute right MCA M1 occlusion.  Intubated for airway protection, was a difficult intubation.  Taken to IR with successful revascularization of right MCA M1 occlusion achieving TICI 2b revascularization.  Returns to ICU, PCCM to consult for ventilator management.   Past Medical History  Afib on xarelto, chronic systolic / dystolic HF, DMT2, HTN, MR, NICM, anemia, obesity, OSA, ED   Significant Hospital Events   4/9 Admitted / EVR  Consults:   Procedures:  4/9 ETT >> 4/9 R radial aline >> 4/9 cerebral angiogram   Significant Diagnostic Tests:  4/9 CTH >> 1. Hyperdense right MCA compatible with acute embolus. Early infarct in the right basal ganglia and insula 2. 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 Poor flow in right MCA branches. Small amount of clot in right A1 segment. Findings compatible with acute embolus. No significant atherosclerotic disease in the neck or head.  4/9 CTH post intervention >>  Hyperdensity within the right basal ganglia may indicate contrast staining or petechial hemorrhage. No significant mass effect.  Micro Data:   Antimicrobials:  4/9 ancef preop  Interim history/subjective:  Arrived on propofol 20 mcg/kg/min and neo at 30 mcg/min peripherally   Objective   Blood pressure (!) 221/145, pulse 77, temperature (!) 97.5 F (36.4 C), temperature source Temporal, resp. rate 16, weight 105 kg, SpO2 100 %.    Vent Mode: PRVC FiO2 (%):  [100 %] 100 % Set Rate:  [15 bmp] 15 bmp Vt Set:  [570 mL] 570 mL PEEP:  [5 cmH20] 5 cmH20   Intake/Output Summary (Last 24 hours) at 10/27/18 1619 Last data filed at Oct 27, 2018 1518 Gross per 24 hour  Intake 2250 ml  Output 100 ml  Net 2150 ml   Filed Weights   2018/10/27 1320  Weight: 105 kg    Examination: General:  Critically ill adult male sedated on MV HEENT: MM pink/moist, 7.5 ETT at 23, pupils 4/sluggish, c-collar in place Neuro: sedated, no response to noxious stimuli CV: SR 65, murmur to RSB, right groin site wnl, soft, dry dressing, +distal pulses PULM: even/non-labored on MV, lungs bilaterally coarse GI: soft, non-tender, bs active, condom catheter Extremities: warm/dry, no edema  Skin: no rashes, tattoos   Resolved Hospital Problem list    Assessment & Plan:  Right MCA M1 occlusion s/p EVR with successful revascularization P:  Per Stroke team and Neuro IR Neo/ cleviprex for Goal SBP 120-140 TTE pending MRI in am  Further stroke  workup per neurology  Ongoing neuro exams  Acute respiratory insufficiency  Difficult airway OSA P:  Full MV support, PRVC 8 cc/kg, rate 15 CXR and ABG now VAP protocol  SBT trials in am SLP post extubation PAD protocol with propofol (if pressure tolerates) and prn fentanyl for RASS goal 0/1  At risk AKI Slight NAGMA P:  NS at 50 ml/hr Trend UOP/ renal panel Check mag/ phos  Afib on xarelto P:  Tele monitor Hold xarelto Continue amiodarone  Chronic  systolic/ diastolic HF - 2/20 EF 55%, normal RV, mod MR, mild dilation of aortic root P:  Hold coreg, lasix, entresto  Hypertension P:  SBP goals above Holding home apresoline, imdur, spironolactone, coreg Check UDS  DMT2 P:  CBG q 4 SSI mod Hold home metformin  Hx normocytic anemia P:  Monitor for bleeding  Trend CBC  Best practice:  Diet: NPO Pain/Anxiety/Delirium protocol (if indicated): propofol/ prn fentanyl VAP protocol (if indicated): yes DVT prophylaxis: SCDs GI prophylaxis: PPI Glucose control: SSI, may need  Mobility: BR Code Status: Full Family Communication: no family at bedside, per Neurology Disposition: ICU  Labs   CBC: Recent Labs  Lab 10/13/2018 1248  WBC 7.3  NEUTROABS 5.8  HGB 12.5*  HCT 38.7*  MCV 97.7  PLT 241    Basic Metabolic Panel: Recent Labs  Lab 10/16/2018 1248 10/06/2018 1252  NA 134*  --   K 4.6  --   CL 100  --   CO2 21*  --   GLUCOSE 434*  --   BUN 25*  --   CREATININE 1.42* 1.30*  CALCIUM 9.2  --    GFR: Estimated Creatinine Clearance: 85.7 mL/min (A) (by C-G formula based on SCr of 1.3 mg/dL (H)). Recent Labs  Lab 10/10/2018 1248  WBC 7.3    Liver Function Tests: Recent Labs  Lab 10/15/2018 1248  AST 19  ALT 22  ALKPHOS 50  BILITOT 1.7*  PROT 7.3  ALBUMIN 3.3*   No results for input(s): LIPASE, AMYLASE in the last 168 hours. No results for input(s): AMMONIA in the last 168 hours.  ABG    Component Value Date/Time   TCO2 28 03/17/2009 1059     Coagulation Profile: Recent Labs  Lab 10/14/2018 1248  INR 2.6*    Cardiac Enzymes: No results for input(s): CKTOTAL, CKMB, CKMBINDEX, TROPONINI in the last 168 hours.  HbA1C: Hgb A1c MFr Bld  Date/Time Value Ref Range Status  03/11/2017 07:41 PM 8.0 (H) 4.8 - 5.6 % Final    Comment:    (NOTE) Pre diabetes:          5.7%-6.4% Diabetes:              >6.4% Glycemic control for   <7.0% adults with diabetes   05/12/2014 04:24 PM 8.9 (H) 4.6 - 6.5 %  Final    Comment:    Glycemic Control Guidelines for People with Diabetes:Non Diabetic:  <6%Goal of Therapy: <7%Additional Action Suggested:  >8%     CBG: Recent Labs  Lab 10/17/2018 1247  GLUCAP 383*    Review of Systems:   Unable  Past Medical History  He,  has a past medical history of Atrial fibrillation with RVR (HCC) (10/2011), Chest pain (03/06/2015), Chronic combined systolic and diastolic CHF (congestive heart failure) (HCC), ED (erectile dysfunction), History of chicken pox, History of noncompliance with medical treatment (03/06/2015), Hypertension, Hypertensive heart disease with heart failure (HCC) (03/28/2016), Hyperthyroidism (03/15/2016), Long term current use of anticoagulant  therapy (01/14/2013), Mitral regurgitation, Nonischemic cardiomyopathy (HCC) (12/27/2012), Normocytic anemia (03/15/2016), Obesity (BMI 30-39.9) (01/21/2014), OSA (obstructive sleep apnea), Paroxysmal atrial fibrillation (HCC) (10/19/2011), Systolic and diastolic CHF, chronic (HCC) (2004), Type II diabetes mellitus, uncontrolled (HCC), Type II or unspecified type diabetes mellitus without mention of complication, uncontrolled, and Warfarin anticoagulation.   Surgical History    Past Surgical History:  Procedure Laterality Date  . CARDIAC CATHETERIZATION  2004   no blockages  . HERNIA REPAIR  2002  . KNEE SURGERY     L knee in HS  . US ECHOCARDIOGRAPHY  07/2011   mod LVH, EF 35%, diffuse hypokinesis of entire myocardium, irreversible restrictive pattern (grade 4 diastolic dysfunction), mod MR, mod dilated LA/RA, decreased RV systolic fxn, no PFO  . US ECHOCARDIOGRAPHY  12/2012   severe LVH, EF 20-25%, mod MR, reduced RV systolic function     Social History   reports that he has never smoked. He has never used smokeless tobacco. He reports that he does not drink alcohol or use drugs.   Family History   His family history includes Diabetes in his mother; Hypertension in his father. There is no history of  Stroke, Coronary artery disease, Cancer, Heart attack, or Thyroid disease.   Allergies No Known Allergies   Home Medications  Prior to Admission medications   Medication Sig Start Date End Date Taking? Authorizing Provider  amiodarone (PACERONE) 200 MG tablet Take 1 tablet (200 mg total) by mouth daily. 07/25/18  Yes Weaver, Scott T, PA-C  carvedilol (COREG) 25 MG tablet Take 1.5 tablets (37.5 mg total) by mouth 2 (two) times daily with a meal. 08/21/18  Yes Weaver, Scott T, PA-C  furosemide (LASIX) 40 MG tablet Take 1.5 tablets (60 mg total) by mouth 2 (two) times daily. 08/08/18 11/06/18 Yes Weaver, Scott T, PA-C  hydrALAZINE (APRESOLINE) 50 MG tablet Take 1 tablet (50 mg total) by mouth 2 (two) times daily. 09/17/18  Yes Weaver, Scott T, PA-C  ipratropium (ATROVENT) 0.03 % nasal spray Place 2 sprays into both nostrils 2 (two) times daily.  08/30/18  Yes [provider]  isosorbide mononitrate (IMDUR) 30 MG 24 hr tablet Take 1 tablet (30 mg total) by mouth daily. 08/21/18  Yes Weaver, Scott T, PA-C  metFORMIN (GLUCOPHAGE) 500 MG tablet Take 1 tablet (500 mg total) by mouth 2 (two) times daily with a meal. Patient taking differently: Take 1,000 mg by mouth 2 (two) times daily with a meal.  10/27/15  Yes Rose, Kayla, PA-C  sacubitril-valsartan (ENTRESTO) 49-51 MG Take 1 tablet by mouth 2 (two) times daily. 08/08/18  Yes Tereso Newcomer T, PA-C  spironolactone (ALDACTONE) 25 MG tablet Take 1 tablet (25 mg total) by mouth daily. 08/23/18  Yes Weaver, Scott T, PA-C  XARELTO 20 MG TABS tablet TAKE 1 TABLET BY MOUTH ONCE DAILY WITH SUPPER Patient taking differently: Take 20 mg by mouth daily with supper.  07/25/18  Yes Weaver, Scott T, PA-C  glucose blood test strip 1 each by Other route as needed for other. Check cbg in morning and 2 hours after largest meal of day.    [provider]  Insulin Degludec (TRESIBA FLEXTOUCH) 200 UNIT/ML SOPN Inject into the skin. 8 units subcutaneously each day.     [provider]  Insulin Pen Needle 31G X 6 MM MISC by Does not apply route. Use for daily insulin injection.    [provider]  LEVEMIR FLEXTOUCH 100 UNIT/ML Pen  08/10/18   [provider]     Critical care time: 35 mins    Posey BoyerBrooke Avir Deruiter, MSN, AGACNP-BC [facility] Pgr: [phone] or if no answer [phone] 10/13/2018, 4:57 PM

## 2018-09-27 NOTE — Progress Notes (Signed)
Patient ID: Johnathan Archer, male   DOB: May 26, 1971, 48 y.o.   MRN: [id number] INR post procedure CT brain reveals no ICH ,mass effect or shift. RT basal ganglia contrast staining noted. RT groin hemostasis achieved with an 47F angioseal device. Distal pulses palpable DPs and PTs bilaterally.  S.Gardiner Espana MD

## 2018-09-27 NOTE — Procedures (Signed)
S/P rt common carotid arteriogram,followed by complete revascularization of RT MCA M 1 occlusion with x 2 passes with 76mm x 64mm embotrap retriever and x 2 passes with th the solitaire 42mm x 40 mmx retriever device achieving a TICI 2 b revascularization.

## 2018-09-27 NOTE — Progress Notes (Signed)
SLP Cancellation Note  Patient Details Name: Johnathan Archer MRN: [id number] DOB: 29-Mar-1971   Cancelled treatment:       Reason Eval/Treat Not Completed: Patient at procedure or test/unavailable. Pt not on floor at this time. SLP will follow up as able.   Corvette Orser I. Vear Clock, MS, CCC-SLP Acute Rehabilitation Services Office number [phone] Pager [phone]  Scheryl Marten 10/16/18, 2:44 PM

## 2018-09-27 NOTE — Progress Notes (Signed)
Patient ID: Johnathan Archer, male   DOB: 08/05/1970, 48 y.o.   MRN: [id number] INR  47 Y M LSW 1245. MRSS 0.  Acute onset rt gaze deviation with rt sided weakness. CT Brain NO ICH ASPECTS 8 CTA occluded  RT MCA M1 . Endovascular treatment of RT MCA  Occlusion D/W spouse. Procedure,risks alternatives reviewed. Risks of ICH 10 % ,worsening neuro function,death,inability to revascularize   and vascular injury discussed. Spouse understands and provided informed witnessed consent for the treatment. S.Kimberely Mccannon MD

## 2018-09-27 NOTE — Anesthesia Procedure Notes (Signed)
Arterial Line Insertion Start/End04/09/2018 1:45 PM, 09/27/2018 1:48 PM Performed by: CRNA  Patient location: OOR procedure area. Preanesthetic checklist: patient identified, IV checked, site marked, risks and benefits discussed, surgical consent, monitors and equipment checked, pre-op evaluation, timeout performed and anesthesia consent Emergency situation Left, radial was placed Catheter size: 20 G Hand hygiene performed , maximum sterile barriers used  and Seldinger technique used Allen's test indicative of satisfactory collateral circulation Attempts: 1 Procedure performed without using ultrasound guided technique. Following insertion, dressing applied and Biopatch. Post procedure assessment: normal  Patient tolerated the procedure well with no immediate complications.

## 2018-09-27 NOTE — ED Triage Notes (Signed)
Pt LKW 1000 found by coworkers wedged between sink and bathroom stall at work. L sided paralysis, R sided gaze.

## 2018-09-27 NOTE — H&P (Signed)
Neurology Consultation  Reason for Consult: Code stroke Referring Physician: ED  CC: Left-sided paralysis.  History is obtained from: EMS  HPI: MIKING COVIN is a 48 y.o. male with PMHx significant for hypertension, diabetes, CHF, and A. fib was brought to ED via EMS from his work when his coworkers found him on the floor in the restroom with left hemiparesis and right gaze deviation.  He was seen at his baseline around 10AM. Patient denies any recent illnesses or sick contact.  Is compliant with his medications.  ED course.  Alert and following simple command.  Unable to communicate. CT head without any hemorrhage, and CTA with  LKW: 10:00 tpa given?: no, patient is on Xarelto-last dose this morning. Premorbid modified Rankin scale (mRS):  0-Completely asymptomatic and back to baseline post-stroke 1-No significant post stroke disability and can perform usual duties with stroke symptoms 2-Slight disability-UNABLE to perform all activities but does not need assistance  3-Moderate disability-requires help but walks WITHOUT assistance 4-Needs assistance to walk and tend to bodily needs 5-Severe disability-bedridden, incontinent, needs constant attention 6- Death  ROS: A 14 point ROS was performed and is negative except as noted in the HPI.   Past Medical History:  Diagnosis Date  . Atrial fibrillation with RVR (HCC) 10/2011   lone episode, converted with IV dilt, on coumadin given elevated CHADS2  . Chest pain 03/06/2015   Myoview 07/2018: EF 33, no ischemia (EF normal by recent echo)  . Chronic combined systolic and diastolic CHF (congestive heart failure) (HCC)    Echo 2014: EF 20-25 // Echo 07/2018: mild LVE, EF 55, normal RVSF, mod LAE, mod MR, mild PI, mild aortic root dilation (38 mm)  . ED (erectile dysfunction)   . History of chicken pox   . History of noncompliance with medical treatment 03/06/2015  . Hypertension   . Hypertensive heart disease with heart failure  (HCC) 03/28/2016  . Hyperthyroidism 03/15/2016  . Long term current use of anticoagulant therapy 01/14/2013  . Mitral regurgitation    Moderate by echo 07/2011  . Nonischemic cardiomyopathy (HCC) 12/27/2012  . Normocytic anemia 03/15/2016  . Obesity (BMI 30-39.9) 01/21/2014  . OSA (obstructive sleep apnea)   . Paroxysmal atrial fibrillation (HCC) 10/19/2011   lone episode, converted with IV dilt, on coumadin given elevated CHADS2   . 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  cath 2004 negative for obstruction, last echo 07/2011 EF 35%. b) Normal coronary arteries by cath in 2004 (when EF was noted at 15%). c) Last EF 20% by echo 12/2012  . Type II diabetes mellitus, uncontrolled (HCC)   . Type II or unspecified type diabetes mellitus without mention of complication, uncontrolled   . Warfarin anticoagulation     Family History  Problem Relation Age of Onset  . Diabetes Mother   . Hypertension Father   . Stroke Neg Hx   . Coronary artery disease Neg Hx   . Cancer Neg Hx   . Heart attack Neg Hx   . Thyroid disease Neg Hx     Social History:   reports that he has never smoked. He has never used smokeless tobacco. He reports that he does not drink alcohol or use drugs.  Medications No current facility-administered medications for this encounter.   Current Outpatient Medications:  .  amiodarone (PACERONE) 200 MG tablet, Take 1 tablet (200 mg total) by mouth daily., Disp: 30 tablet, Rfl:  0 .  carvedilol (COREG) 25 MG tablet, Take 1.5 tablets (37.5 mg total) by mouth 2 (two) times daily with a meal., Disp: 135 tablet, Rfl: 3 .  furosemide (LASIX) 40 MG tablet, Take 1.5 tablets (60 mg total) by mouth 2 (two) times daily., Disp: 180 tablet, Rfl: 3 .  glucose blood test strip, 1 each by Other route as needed for other. Check cbg in morning and 2 hours after largest meal of day., Disp: , Rfl:  .  hydrALAZINE (APRESOLINE) 50  MG tablet, Take 1 tablet (50 mg total) by mouth 2 (two) times daily., Disp: 90 tablet, Rfl: 3 .  Insulin Degludec (TRESIBA FLEXTOUCH) 200 UNIT/ML SOPN, Inject into the skin. 8 units subcutaneously each day., Disp: , Rfl:  .  Insulin Pen Needle 31G X 6 MM MISC, by Does not apply route. Use for daily insulin injection., Disp: , Rfl:  .  isosorbide mononitrate (IMDUR) 30 MG 24 hr tablet, Take 1 tablet (30 mg total) by mouth daily., Disp: 90 tablet, Rfl: 3 .  metFORMIN (GLUCOPHAGE) 500 MG tablet, Take 1 tablet (500 mg total) by mouth 2 (two) times daily with a meal., Disp: 60 tablet, Rfl: 0 .  sacubitril-valsartan (ENTRESTO) 49-51 MG, Take 1 tablet by mouth 2 (two) times daily., Disp: 60 tablet, Rfl: 11 .  spironolactone (ALDACTONE) 25 MG tablet, Take 1 tablet (25 mg total) by mouth daily., Disp: 90 tablet, Rfl: 3 .  XARELTO 20 MG TABS tablet, TAKE 1 TABLET BY MOUTH ONCE DAILY WITH SUPPER, Disp: 90 tablet, Rfl: 0  Exam: Current vital signs: Temp (!) 97.5 F (36.4 C) (Temporal)  Vital signs in last 24 hours: Temp:  [97.5 F (36.4 C)] 97.5 F (36.4 C) (04/09 1246)  Physical Exam  Constitutional: Appears well-developed and well-nourished.  Eyes: No scleral injection HENT: No OP obstrucion Head: Normocephalic.  Cardiovascular: Normal rate and regular rhythm.  Respiratory: Effort normal, non-labored breathing GI: Soft.  No distension. There is no tenderness.  Skin: WDI  Neuro: Mental Status: Patient is awake, alert, and following simple commands. Patient is was not able to give a clear and coherent history. Right gaze deviation with left hemi neglect. Cranial Nerves: II: Visual Fields are full. Pupils are equal, round, and reactive to light.   III,IV, VI: EOMI without ptosis or diploplia.  Does not cross midline to the left. V: Facial sensation is symmetric to temperature VII: Left facial droop.  VIII: hearing is intact to voice Motor: Tone is normal. Bulk is normal. 5/5 strength was  present in right upper and lower extremities.  1/5 in the left upper and lower extremities. Sensory: Unable to detect any secretions on the left upper and lower extremities. Plantars: Toes are downgoing bilaterally.   NIHSS- 22  Labs I have reviewed labs in epic and the results pertinent to this consultation are:  CBC    Component Value Date/Time   WBC 7.3 09/30/2018 1248   RBC 3.96 (L) 10/12/2018 1248   HGB 12.5 (L) 10/05/2018 1248   HCT 38.7 (L) 10/12/2018 1248   PLT 241 10/03/2018 1248   MCV 97.7 09/21/2018 1248   MCH 31.6 10/01/2018 1248   MCHC 32.3 10/12/2018 1248   RDW 13.2 09/26/2018 1248   LYMPHSABS 0.9 10/01/2018 1248   MONOABS 0.5 10/12/2018 1248   EOSABS 0.1 10/08/2018 1248   BASOSABS 0.0 09/22/2018 1248    CMP     Component Value Date/Time   NA 140 08/17/2018 1117   K 3.6 08/17/2018  1117   CL 100 08/17/2018 1117   CO2 25 08/17/2018 1117   GLUCOSE 236 (H) 08/17/2018 1117   GLUCOSE 398 (H) 11/14/2017 1707   BUN 16 08/17/2018 1117   CREATININE 1.30 (H) 07/18/18 1252   CREATININE 1.20 05/18/2016 1012   CALCIUM 9.0 08/17/2018 1117   PROT 6.2 03/15/2016 0915   ALBUMIN 3.4 (L) 03/15/2016 0915   AST 20 03/15/2016 0915   ALT 43 03/15/2016 0915   ALKPHOS 47 03/15/2016 0915   BILITOT 1.2 03/15/2016 0915   GFRNONAA 61 08/17/2018 1117   GFRAA 70 08/17/2018 1117    Lipid Panel     Component Value Date/Time   CHOL 191 06/26/2014 1526   TRIG 223.0 (H) 06/26/2014 1526   HDL 40.30 06/26/2014 1526   CHOLHDL 5 06/26/2014 1526   VLDL 44.6 (H) 06/26/2014 1526   LDLCALC 85 05/12/2014 1624   LDLDIRECT 111.5 06/26/2014 1526     Imaging I have reviewed the images obtained:  CT-scan of the brain shows hyperdense right MCA compatible with acute embolus, early infarct in the right basal ganglia and insula.  Assessment:   Thelma CompChristopher D Pinkerton is a 48 y.o. male with PMHx significant for hypertension, diabetes, CHF, and A. fib was brought to ED via EMS from his  work when his coworkers found him on the floor in the restroom with left hemiparesis and right gaze deviation.  He was seen at his baseline around 10AM.  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   - Currently hyperglycemic. -SSI.  Chronic systolic heart failure -Avoid volume overload -Gentle IV fluids  Atrial fibrillation -Hold Xarelto -Resume depending on size of infarct  Arnetha CourserSumayya Amin MD PGY3 Pager [phone] 10/10/2018, 12:59 PM     NEUROHOSPITALIST ADDENDUM Performed a face to face diagnostic evaluation.   I have reviewed the contents of history and physical exam as documented by PA/ARNP/Resident and agree with above documentation.  I have discussed and formulated the above plan as documented. Edits to the note have been made as needed.  48 year old male with past medical history of atrial fibrillation on Xarelto, compliant with medication found on the bathroom floor.  Has right gaze deviation, paresis. No TPA patient on Xarelto.  Underwent mechanical thrombectomy, TPA 2B recanalization requiring 4 passes.  Post IR patient still plegic on the left side, following commands.  CT head performed showed contrast thinning versus hemorrhage in right basal ganglia. Will hold anticoagulation status, antiplatelets until MRI to determine size of stroke, as per hemorrhage and will make decision based on that.   This patient is neurologically critically ill due to Right MCA stroke status post mechanical  thrombectomy.  He is at risk for significant risk of neurological worsening from cerebral edema,  death from brain herniation, heart failure, hemorrhagic conversion, infection, respiratory failure and seizure. This patient's care requires constant monitoring of vital signs, hemodynamics, respiratory and cardiac monitoring, review of multiple databases, neurological assessment, discussion with family, other specialists and medical decision making of high complexity.  I spent 70  minutes  of neurocritical time in the care of this patient.       Georgiana Spinner  MD [facility] [phone]   If 7pm to 7am, please call on call as listed on AMION.

## 2018-09-27 NOTE — ED Provider Notes (Signed)
[facility] EMERGENCY DEPARTMENT Provider Note   CSN: [id number] Arrival date & time: 2018-10-07  1245  An emergency department physician performed an initial assessment on this suspected stroke patient at 1246.  History   Chief Complaint Chief Complaint  Patient presents with   Code Stroke    HPI Johnathan Archer is a 48 y.o. male.     HPI Patient arrived to the ED as code stroke.  He was initially seen at the bridge by Dr. Charm Barges and cleared.  After the CT angios it was determined that the patient needed emergent interventional radiology treatment.  Patient was returned to the emergency department for intubation.  History obtained from EMS note and neurology note.  Patient was at work and had gone into the restroom.  He did not come back for a period of time and coworkers went to check on him.  He was found lying on the floor with a left paralysis and staring off to the right.  He had last been seen at baseline at 10 AM.  Patient is on daily Xarelto with history of atrial fibrillation.  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  On arriving back to the emergency department, the patient had a significant right gaze deviation.  He could make some sonorous verbal responses.  No meaningful speech. Past Medical History:  Diagnosis Date   Atrial fibrillation with RVR (HCC) 10/2011   lone episode, converted with IV dilt, on coumadin given elevated CHADS2   Chest pain 03/06/2015   Myoview 07/2018: EF 33, no ischemia (EF normal by recent echo)   Chronic combined systolic and diastolic CHF (congestive heart failure) (HCC)    Echo 2014: EF 20-25 // Echo 07/2018: mild LVE, EF 55, normal RVSF, mod LAE, mod MR, mild PI, mild aortic root dilation (38 mm)   ED (erectile dysfunction)    History of chicken pox    History of noncompliance with medical treatment 03/06/2015   Hypertension    Hypertensive heart disease with heart failure (HCC)  03/28/2016   Hyperthyroidism 03/15/2016   Long term current use of anticoagulant therapy 01/14/2013   Mitral regurgitation    Moderate by echo 07/2011   Nonischemic cardiomyopathy (HCC) 12/27/2012   Normocytic anemia 03/15/2016   Obesity (BMI 30-39.9) 01/21/2014   OSA (obstructive sleep apnea)    Paroxysmal atrial fibrillation (HCC) 10/19/2011   lone episode, converted with IV dilt, on coumadin given elevated CHADS2    Systolic and diastolic CHF, chronic (HCC) 2004   a) Idiopathic dilated CM, 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  cath 2004 negative for obstruction, last echo 07/2011 EF 35%. b) Normal coronary arteries by cath in 2004 (when EF was noted at 15%). c) Last EF 20% by echo 12/2012   Type II diabetes mellitus, uncontrolled (HCC)    Type II or unspecified type diabetes mellitus without mention of complication, uncontrolled    Warfarin anticoagulation     Patient Active Problem List   Diagnosis Date Noted   Ischemic stroke (HCC) 10-07-18   Hypertensive heart disease with heart failure (HCC) 03/28/2016   Normocytic anemia 03/15/2016   Hyperthyroidism 03/15/2016   Chronic combined systolic and diastolic CHF (congestive heart failure) (HCC)    History of noncompliance with medical treatment 03/06/2015   Chest pain, atypical 03/06/2015   Obesity (BMI 30-39.9) 01/21/2014   Long term current use of anticoagulant therapy 01/14/2013   Nonischemic cardiomyopathy (HCC) 12/27/2012   Paroxysmal atrial fibrillation (  HCC) 10/19/2011   ED (erectile dysfunction)    Type II diabetes mellitus, uncontrolled (HCC)    OSA (obstructive sleep apnea)     Past Surgical History:  Procedure Laterality Date   CARDIAC CATHETERIZATION  2004   no blockages   HERNIA REPAIR  2002   KNEE SURGERY     L knee in HS   US ECHOCARDIOGRAPHY  07/2011   mod LVH, EF 35%, diffuse hypokinesis of entire myocardium, irreversible restrictive pattern (grade 4 diastolic dysfunction), mod  MR, mod dilated LA/RA, decreased RV systolic fxn, no PFO   US ECHOCARDIOGRAPHY  12/2012   severe LVH, EF 20-25%, mod MR, reduced RV systolic function        Home Medications    Prior to Admission medications   Medication Sig Start Date End Date Taking? Authorizing Provider  amiodarone (PACERONE) 200 MG tablet Take 1 tablet (200 mg total) by mouth daily. 07/25/18   Tereso Newcomer T, PA-C  carvedilol (COREG) 25 MG tablet Take 1.5 tablets (37.5 mg total) by mouth 2 (two) times daily with a meal. 08/21/18   Weaver, Scott T, PA-C  furosemide (LASIX) 40 MG tablet Take 1.5 tablets (60 mg total) by mouth 2 (two) times daily. 08/08/18 11/06/18  Tereso Newcomer T, PA-C  glucose blood test strip 1 each by Other route as needed for other. Check cbg in morning and 2 hours after largest meal of day.    [provider]  hydrALAZINE (APRESOLINE) 50 MG tablet Take 1 tablet (50 mg total) by mouth 2 (two) times daily. 09/17/18   Tereso Newcomer T, PA-C  Insulin Degludec (TRESIBA FLEXTOUCH) 200 UNIT/ML SOPN Inject into the skin. 8 units subcutaneously each day.    [provider]  Insulin Pen Needle 31G X 6 MM MISC by Does not apply route. Use for daily insulin injection.    [provider]  isosorbide mononitrate (IMDUR) 30 MG 24 hr tablet Take 1 tablet (30 mg total) by mouth daily. 08/21/18   Tereso Newcomer T, PA-C  metFORMIN (GLUCOPHAGE) 500 MG tablet Take 1 tablet (500 mg total) by mouth 2 (two) times daily with a meal. 10/27/15   Cheri Fowler, PA-C  sacubitril-valsartan (ENTRESTO) 49-51 MG Take 1 tablet by mouth 2 (two) times daily. 08/08/18   Tereso Newcomer T, PA-C  spironolactone (ALDACTONE) 25 MG tablet Take 1 tablet (25 mg total) by mouth daily. 08/23/18   Weaver, Scott T, PA-C  XARELTO 20 MG TABS tablet TAKE 1 TABLET BY MOUTH ONCE DAILY WITH SUPPER 07/25/18   Tereso Newcomer T, PA-C    Family History Family History  Problem Relation Age of Onset   Diabetes Mother    Hypertension Father     Stroke Neg Hx    Coronary artery disease Neg Hx    Cancer Neg Hx    Heart attack Neg Hx    Thyroid disease Neg Hx     Social History Social History   Tobacco Use   Smoking status: Never Smoker   Smokeless tobacco: Never Used  Substance Use Topics   Alcohol use: No    Alcohol/week: 0.0 standard drinks    Comment: Occasional 1x/mo   Drug use: No     Allergies   Patient has no known allergies.   Review of Systems Review of Systems Level 5 caveat cannot obtain review of systems due to patient condition.  Physical Exam Updated Vital Signs BP (!) 221/145    Pulse 77    Temp (!) 97.5  F (36.4 C) (Temporal)    Resp 16    Wt 105 kg    SpO2 100%    BMI 32.29 kg/m   Physical Exam Constitutional:      Comments: Patient is semi-obtunded.  Supine on the stretcher.  No respiratory distress but some sonorous respirations.  HENT:     Head: Normocephalic and atraumatic.     Mouth/Throat:     Comments: Patient appears to have capped dentition.  He has a large incisors and small mouth opening. Eyes:     Comments: Right gaze deviation  Cardiovascular:     Rate and Rhythm: Normal rate and regular rhythm.  Pulmonary:     Comments: Slightly sonorous respirations.  No gross respiratory distress.  Transmitted airway sounds bilaterally. Abdominal:     Comments: Abdomen soft obese mildly distended.  Musculoskeletal:     Comments: No evident deformities.  Skin:    General: Skin is warm and dry.  Neurological:     Comments: Semi-obtunded.  Forced gaze deviation.  Not following commands.      ED Treatments / Results  Labs (all labs ordered are listed, but only abnormal results are displayed) Labs Reviewed  PROTIME-INR - Abnormal; Notable for the following components:      Result Value   Prothrombin Time 27.4 (*)    INR 2.6 (*)    All other components within normal limits  CBC - Abnormal; Notable for the following components:   RBC 3.96 (*)    Hemoglobin 12.5 (*)     HCT 38.7 (*)    All other components within normal limits  COMPREHENSIVE METABOLIC PANEL - Abnormal; Notable for the following components:   Sodium 134 (*)    CO2 21 (*)    Glucose, Bld 434 (*)    BUN 25 (*)    Creatinine, Ser 1.42 (*)    Albumin 3.3 (*)    Total Bilirubin 1.7 (*)    GFR calc non Af Amer 58 (*)    All other components within normal limits  I-STAT CREATININE, ED - Abnormal; Notable for the following components:   Creatinine, Ser 1.30 (*)    All other components within normal limits  CBG MONITORING, ED - Abnormal; Notable for the following components:   Glucose-Capillary 383 (*)    All other components within normal limits  ETHANOL  APTT  DIFFERENTIAL  RAPID URINE DRUG SCREEN, HOSP PERFORMED  URINALYSIS, ROUTINE W REFLEX MICROSCOPIC  HIV ANTIBODY (ROUTINE TESTING W REFLEX)    EKG None  Radiology Ct Angio Head W Or Wo Contrast  Result Date: 26-Oct-2018 CLINICAL DATA:  Stroke.  Left-sided weakness EXAM: CT ANGIOGRAPHY HEAD AND NECK TECHNIQUE: Multidetector CT imaging of the head and neck was performed using the standard protocol during bolus administration of intravenous contrast. Multiplanar CT image reconstructions and MIPs were obtained to evaluate the vascular anatomy. Carotid stenosis measurements (when applicable) are obtained utilizing NASCET criteria, using the distal internal carotid diameter as the denominator. CONTRAST:  75mL OMNIPAQUE IOHEXOL 350 MG/ML SOLN COMPARISON:  CT head 2018-10-26 FINDINGS: CTA NECK FINDINGS Aortic arch: Standard branching. Imaged portion shows no evidence of aneurysm or dissection. No significant stenosis of the major arch vessel origins. Right carotid system: Right carotid system widely patent. No stenosis or atherosclerotic disease Left carotid system: Left carotid system widely patent. No stenosis or atherosclerotic disease Vertebral arteries: Both vertebral arteries are widely patent without significant stenosis or atherosclerotic  disease. Skeleton: No acute skeletal abnormality. Other neck: Negative  for mass or soft tissue swelling in the neck. Upper chest: Lung apices clear bilaterally. Review of the MIP images confirms the above findings CTA HEAD FINDINGS Anterior circulation: Right cavernous carotid patent. There is occlusion of the supraclinoid internal carotid artery just proximal to the bifurcation. Clot extends into the right middle cerebral artery M1 which is occluded. There is little flow in right MCA branches. Small amount of clot in the proximal right A1 segment which shows good flow possibly from the left side. Both anterior cerebral arteries are patent Left cavernous carotid widely patent. Left anterior and middle cerebral arteries widely patent without stenosis. Posterior circulation: No significant stenosis, proximal occlusion, aneurysm, or vascular malformation. 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 Poor flow in right MCA branches. Small amount of clot in right A1 segment. Findings compatible with acute embolus. No significant atherosclerotic disease in the neck or head. These results were called by telephone at the time of interpretation on 10/15/2018 at 1:19 pm to Dr. Arther Dames , who verbally acknowledged these results. Electronically Signed   By: Marlan Palau M.D.   On: 10/04/2018 13:21   Ct Angio Neck W Or Wo Contrast  Result Date: 09/29/2018 CLINICAL DATA:  Stroke.  Left-sided weakness EXAM: CT ANGIOGRAPHY HEAD AND NECK TECHNIQUE: Multidetector CT imaging of the head and neck was performed using the standard protocol during bolus administration of intravenous contrast. Multiplanar CT image reconstructions and MIPs were obtained to evaluate the vascular anatomy. Carotid stenosis  measurements (when applicable) are obtained utilizing NASCET criteria, using the distal internal carotid diameter as the denominator. CONTRAST:  75mL OMNIPAQUE IOHEXOL 350 MG/ML SOLN COMPARISON:  CT head 10/01/2018 FINDINGS: CTA NECK FINDINGS Aortic arch: Standard branching. Imaged portion shows no evidence of aneurysm or dissection. No significant stenosis of the major arch vessel origins. Right carotid system: Right carotid system widely patent. No stenosis or atherosclerotic disease Left carotid system: Left carotid system widely patent. No stenosis or atherosclerotic disease Vertebral arteries: Both vertebral arteries are widely patent without significant stenosis or atherosclerotic disease. Skeleton: No acute skeletal abnormality. Other neck: Negative for mass or soft tissue swelling in the neck. Upper chest: Lung apices clear bilaterally. Review of the MIP images confirms the above findings CTA HEAD FINDINGS Anterior circulation: Right cavernous carotid patent. There is occlusion of the supraclinoid internal carotid artery just proximal to the bifurcation. Clot extends into the right middle cerebral artery M1 which is occluded. There is little flow in right MCA branches. Small amount of clot in the proximal right A1 segment which shows good flow possibly from the left side. Both anterior cerebral arteries are patent Left cavernous carotid widely patent. Left anterior and middle cerebral arteries widely patent without stenosis. Posterior circulation: No significant stenosis, proximal occlusion, aneurysm, or vascular malformation. 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 Poor flow in right MCA branches. Small amount of clot in right A1 segment. Findings compatible with acute  embolus. No significant atherosclerotic disease in the neck or head. These results were called by telephone at the time of interpretation on 10/04/2018 at  1:19 pm to Dr. Arther Dames , who verbally acknowledged these results. Electronically Signed   By: Marlan Palau M.D.   On: 10-16-18 13:21   Ct Head Code Stroke Wo Contrast  Result Date: 16-Oct-2018 CLINICAL DATA:  Code stroke. Left-sided weakness. On Xarelto for atrial fibrillation EXAM: CT HEAD WITHOUT CONTRAST TECHNIQUE: Contiguous axial images were obtained from the base of the skull through the vertex without intravenous contrast. COMPARISON:  CT head 11/14/2017 FINDINGS: Brain: Ill-defined hypodensity right basal ganglia compatible with early infarction involving the inferior basal ganglia, including the putamen and external capsule and possibly the inferior insula. Negative for hemorrhage or mass effect. No midline shift. Ventricle size normal. Vascular: Extensive hyperdense right MCA involving the entire right M1 extending into the right M2. Other vessels negative Skull: Negative Sinuses/Orbits: Mild mucosal edema right maxillary sinus. Normal orbit Multiple dental caries Other: None ASPECTS (Alberta Stroke Program Early CT Score) - Ganglionic level infarction (caudate, lentiform nuclei, internal capsule, insula, M1-M3 cortex): 5 - Supraganglionic infarction (M4-M6 cortex): 3 Total score (0-10 with 10 being normal): 8 IMPRESSION: 1. Hyperdense right MCA compatible with acute embolus. Early infarct in the right basal ganglia and insula 2. ASPECTS is 8 3. These results were called by telephone at the time of interpretation on 2018-10-16 at 1:08 pm to Dr. Laurence Slate , who verbally acknowledged these results. Electronically Signed   By: Marlan Palau M.D.   On: 2018-10-16 13:10    Procedures Procedure Name: Intubation Date/Time: 16-Oct-2018 1:15 PM Performed by: Arby Barrette, MD Pre-anesthesia Checklist: Patient identified, Patient being monitored,  Emergency Drugs available, Timeout performed and Suction available Oxygen Delivery Method: Ambu bag Preoxygenation: Pre-oxygenation with 100% oxygen Induction Type: Rapid sequence Ventilation: Mask ventilation with difficulty and Two handed mask ventilation required Laryngoscope Size: 3 and Glidescope Grade View: Grade IV Tube size: 7.5 mm Number of attempts: 2 Airway Equipment and Method: Video-laryngoscopy Placement Confirmation: ETT inserted through vocal cords under direct vision,  CO2 detector,  Breath sounds checked- equal and bilateral and Positive ETCO2 Tube secured with: ETT holder Dental Injury: Injury to tongue  Difficulty Due To: Difficult Airway- due to anterior larynx, Difficult Airway- due to limited oral opening and Difficult Airway- due to dentition Future Recommendations: Recommend- induction with short-acting agent, and alternative techniques readily available      (including critical care time) CRITICAL CARE Performed by: Arby Barrette   Total critical care time: 20  minutes  Critical care time was exclusive of separately billable procedures and treating other patients.  Critical care was necessary to treat or prevent imminent or life-threatening deterioration.  Critical care was time spent personally by me on the following activities: development of treatment plan with patient and/or surrogate as well as nursing, discussions with consultants, evaluation of patient's response to treatment, examination of patient, obtaining history from patient or surrogate, ordering and performing treatments and interventions, ordering and review of laboratory studies, ordering and review of radiographic studies, pulse oximetry and re-evaluation of patient's condition. Medications Ordered in ED Medications   stroke: mapping our early stages of recovery book (has no administration in time range)  0.9 %  sodium chloride infusion (has no administration in time range)    acetaminophen (TYLENOL) tablet 650 mg (has no administration in time range)    Or  acetaminophen (TYLENOL) solution 650 mg (has no administration in time range)    Or  acetaminophen (TYLENOL) suppository 650 mg (has no administration in time range)  etomidate (AMIDATE) injection (20 mg Intravenous  Given 09/19/2018 1310)  succinylcholine (ANECTINE) injection (150 mg Intravenous Given 10/10/2018 1312)  tirofiban (AGGRASTAT) 5-0.9 MG/100ML-% injection (has no administration in time range)  ticagrelor (BRILINTA) 90 MG tablet (has no administration in time range)  aspirin 325 MG tablet (has no administration in time range)  clopidogrel (PLAVIX) 300 MG tablet (has no administration in time range)  lidocaine (XYLOCAINE) 1 % (with pres) injection (has no administration in time range)  nitroGLYCERIN 100 mcg/mL intra-arterial injection (has no administration in time range)  eptifibatide (INTEGRILIN) 20 MG/10ML injection (has no administration in time range)  insulin aspart (novoLOG) injection 0-15 Units (has no administration in time range)  propofol (DIPRIVAN) 1000 MG/100ML infusion (30 mcg/kg/min  105 kg Intravenous New Bag/Given 09/22/2018 1330)  vecuronium (NORCURON) 10 MG injection (has no administration in time range)  fentaNYL (SUBLIMAZE) injection 150 mcg (has no administration in time range)  midazolam (VERSED) injection 5 mg (has no administration in time range)  iohexol (OMNIPAQUE) 350 MG/ML injection 75 mL (75 mLs Intravenous Contrast Given 09/28/2018 1304)  vecuronium (NORCURON) injection 10 mg (10 mg Intravenous Given 09/28/2018 1330)     Initial Impression / Assessment and Plan / ED Course  I have reviewed the triage vital signs and the nursing notes.  Pertinent labs & imaging results that were available during my care of the patient were reviewed by me and considered in my medical decision making (see chart for details).       Patient with acute stroke, large MCA.  Seen immediately by neurology  for code stroke.  Return to the emergency department for intubation.  RSI but patient was difficult intubation requiring bagging between second attempt.  Patient has small airway with poor visualization an anterior glottis.  Ultimately on second attempt intubated with direct visualization.  Review of post intubation x-ray shows slight right mainstem suspected.  Tube withdrawn 2 cm, repeat x-ray will slightly deep.  Withdrawn another 1 cm.  Patient maintained 100% oxygen saturation and stable heart rate during repositioning of tube.  After last adjustment, auscultation is for bilateral and symmetric breath sounds with ventilator ventilating.  Patient started with a propofol bolus and drip and vecuronium for transport to IR suite.  Final Clinical Impressions(s) / ED Diagnoses   Final diagnoses:  Stroke [facility])  Stroke (cerebrum) [facility])    ED Discharge Orders    None       Arby Barrette, MD 09/21/2018 1350

## 2018-09-27 NOTE — Anesthesia Preprocedure Evaluation (Signed)
Anesthesia Evaluation  Patient identified by MRN, date of birth, ID band Patient awake    Reviewed: Allergy & Precautions, NPO status , Patient's Chart, lab work & pertinent test results  Airway Mallampati: II  TM Distance: >3 FB Neck ROM: Full    Dental no notable dental hx.    Pulmonary sleep apnea ,    Pulmonary exam normal breath sounds clear to auscultation       Cardiovascular hypertension, Pt. on medications + Peripheral Vascular Disease and +CHF  + Valvular Problems/Murmurs MR  Rhythm:Regular Rate:Normal + Systolic murmurs FK81-27%   Neuro/Psych negative neurological ROS  negative psych ROS   GI/Hepatic negative GI ROS, Neg liver ROS,   Endo/Other  diabetesHyperthyroidism   Renal/GU negative Renal ROS  negative genitourinary   Musculoskeletal negative musculoskeletal ROS (+)   Abdominal   Peds negative pediatric ROS (+)  Hematology negative hematology ROS (+)   Anesthesia Other Findings   Reproductive/Obstetrics negative OB ROS                             Anesthesia Physical Anesthesia Plan  ASA: IV and emergent  Anesthesia Plan: General   Post-op Pain Management:    Induction: Intravenous  PONV Risk Score and Plan: 2 and Ondansetron, Dexamethasone and Treatment may vary due to age or medical condition  Airway Management Planned: Oral ETT  Additional Equipment:   Intra-op Plan:   Post-operative Plan: Possible Post-op intubation/ventilation  Informed Consent: I have reviewed the patients History and Physical, chart, labs and discussed the procedure including the risks, benefits and alternatives for the proposed anesthesia with the patient or authorized representative who has indicated his/her understanding and acceptance.     Dental advisory given  Plan Discussed with: CRNA and Surgeon  Anesthesia Plan Comments:         Anesthesia Quick Evaluation

## 2018-09-27 NOTE — Progress Notes (Signed)
FiO2 dropped to 30% per ABG results.

## 2018-09-27 NOTE — Code Documentation (Signed)
48yo male arriving to [facility] via [facility] at 46. Patient from work where he was LKW at 1000 and later found in the bathroom. EMS was called and activated a code stroke for right gaze and left hemiplegia. Stroke team to the bedside on patient arrival. Labs drawn and patient cleared for CT by Dr. Charm Barges. Patient to CT with team. NIHSS 22, see documentation for details and code stroke times. Patient with right gaze, left hemianopsia, left hemiplegia and neglect and severe dysarthria on exam. CT completed followed by CTA head and neck which showed a right MCA occlusion. IR pre-notified initially at 1246 on arrival when VAN+ and then again once LVO confirmed on CTA. Patient is on Xarelto and confirms that he took his dose this morning and does not miss doses. He is contraindicated for treatment with tPA. Patient transported back to the ED at 1303 for intubation prior to the procedure. Patient's wife called and updated on plan of care. Patient then transported to IR with bedside handoff to IR and anesthesia team.

## 2018-09-27 NOTE — Progress Notes (Signed)
Patient was transported from IR to CT and then to 4N29 without any complications.

## 2018-09-27 NOTE — Progress Notes (Signed)
Assisted tele visit to patient with wife and son.  [phone]   Melrose Nakayama, RN

## 2018-09-27 NOTE — Transfer of Care (Signed)
Immediate Anesthesia Transfer of Care Note  Patient: Johnathan Archer  Procedure(s) Performed: IR WITH ANESTHESIA ([name] )  Patient Location: ICU  Anesthesia Type:General  Level of Consciousness: Patient remains intubated per anesthesia plan  Airway & Oxygen Therapy: Patient remains intubated per anesthesia plan and Patient placed on Ventilator (see vital sign flow sheet for setting)  Post-op Assessment: Report given to RN and Post -op Vital signs reviewed and stable  Post vital signs: Reviewed and stable  Last Vitals:  Vitals Value Taken Time  BP    Temp    Pulse 66 10/13/2018  4:48 PM  Resp 21 10/15/2018  4:47 PM  SpO2 99 % 10/16/2018  4:48 PM  Vitals shown include unvalidated device data.  Last Pain:  Vitals:   09/23/2018 1630  TempSrc: Axillary  PainSc:          Complications: No apparent anesthesia complications

## 2018-09-28 ENCOUNTER — Inpatient Hospital Stay (HOSPITAL_COMMUNITY): Payer: BLUE CROSS/BLUE SHIELD

## 2018-09-28 ENCOUNTER — Encounter (HOSPITAL_COMMUNITY): Payer: Self-pay | Admitting: Interventional Radiology

## 2018-09-28 DIAGNOSIS — J9601 Acute respiratory failure with hypoxia: Secondary | ICD-10-CM

## 2018-09-28 DIAGNOSIS — E1159 Type 2 diabetes mellitus with other circulatory complications: Secondary | ICD-10-CM

## 2018-09-28 DIAGNOSIS — I4891 Unspecified atrial fibrillation: Secondary | ICD-10-CM

## 2018-09-28 DIAGNOSIS — Z794 Long term (current) use of insulin: Secondary | ICD-10-CM

## 2018-09-28 DIAGNOSIS — R1312 Dysphagia, oropharyngeal phase: Secondary | ICD-10-CM

## 2018-09-28 DIAGNOSIS — E785 Hyperlipidemia, unspecified: Secondary | ICD-10-CM

## 2018-09-28 DIAGNOSIS — I5041 Acute combined systolic (congestive) and diastolic (congestive) heart failure: Secondary | ICD-10-CM

## 2018-09-28 DIAGNOSIS — I639 Cerebral infarction, unspecified: Secondary | ICD-10-CM

## 2018-09-28 DIAGNOSIS — R739 Hyperglycemia, unspecified: Secondary | ICD-10-CM

## 2018-09-28 LAB — SODIUM
Sodium: 142 mmol/L (ref 135–145)
Sodium: 146 mmol/L — ABNORMAL HIGH (ref 135–145)
Sodium: 148 mmol/L — ABNORMAL HIGH (ref 135–145)

## 2018-09-28 LAB — BASIC METABOLIC PANEL
Anion gap: 12 (ref 5–15)
BUN: 25 mg/dL — ABNORMAL HIGH (ref 6–20)
CO2: 20 mmol/L — ABNORMAL LOW (ref 22–32)
Calcium: 8.4 mg/dL — ABNORMAL LOW (ref 8.9–10.3)
Chloride: 107 mmol/L (ref 98–111)
Creatinine, Ser: 1.51 mg/dL — ABNORMAL HIGH (ref 0.61–1.24)
GFR calc Af Amer: >60 mL/min (ref >60)
GFR calc non Af Amer: 54 mL/min — ABNORMAL LOW (ref >60)
Glucose, Bld: 186 mg/dL — ABNORMAL HIGH (ref 70–99)
Potassium: 3.9 mmol/L (ref 3.5–5.1)
Sodium: 139 mmol/L (ref 135–145)

## 2018-09-28 LAB — CBC WITH DIFFERENTIAL/PLATELET
Abs Immature Granulocytes: 0.02 10*3/uL (ref 0.00–0.07)
Basophils Absolute: 0 10*3/uL (ref 0.0–0.1)
Basophils Relative: 0 %
Eosinophils Absolute: 0 10*3/uL (ref 0.0–0.5)
Eosinophils Relative: 0 %
HCT: 37 % — ABNORMAL LOW (ref 39.0–52.0)
Hemoglobin: 11.9 g/dL — ABNORMAL LOW (ref 13.0–17.0)
Immature Granulocytes: 0 %
Lymphocytes Relative: 8 %
Lymphs Abs: 0.8 10*3/uL (ref 0.7–4.0)
MCH: 31.2 pg (ref 26.0–34.0)
MCHC: 32.2 g/dL (ref 30.0–36.0)
MCV: 97.1 fL (ref 80.0–100.0)
Monocytes Absolute: 0.7 10*3/uL (ref 0.1–1.0)
Monocytes Relative: 7 %
Neutro Abs: 8.1 10*3/uL — ABNORMAL HIGH (ref 1.7–7.7)
Neutrophils Relative %: 85 %
Platelets: 187 10*3/uL (ref 150–400)
RBC: 3.81 MIL/uL — ABNORMAL LOW (ref 4.22–5.81)
RDW: 13.4 % (ref 11.5–15.5)
WBC: 9.7 10*3/uL (ref 4.0–10.5)
nRBC: 0 % (ref 0.0–0.2)

## 2018-09-28 LAB — LIPID PANEL
Cholesterol: 134 mg/dL (ref 0–200)
HDL: 53 mg/dL (ref >40)
LDL Cholesterol: 73 mg/dL (ref 0–99)
Total CHOL/HDL Ratio: 2.5 RATIO
Triglycerides: 40 mg/dL (ref <150)
VLDL: 8 mg/dL (ref 0–40)

## 2018-09-28 LAB — POCT I-STAT 7, (LYTES, BLD GAS, ICA,H+H)
Acid-base deficit: 3 mmol/L — ABNORMAL HIGH (ref 0.0–2.0)
Bicarbonate: 21.3 mmol/L (ref 20.0–28.0)
Calcium, Ion: 1.15 mmol/L (ref 1.15–1.40)
HCT: 35 % — ABNORMAL LOW (ref 39.0–52.0)
Hemoglobin: 11.9 g/dL — ABNORMAL LOW (ref 13.0–17.0)
O2 Saturation: 99 %
Patient temperature: 97.7
Potassium: 3.9 mmol/L (ref 3.5–5.1)
Sodium: 142 mmol/L (ref 135–145)
TCO2: 22 mmol/L (ref 22–32)
pCO2 arterial: 33.4 mmHg (ref 32.0–48.0)
pH, Arterial: 7.41 (ref 7.350–7.450)
pO2, Arterial: 112 mmHg — ABNORMAL HIGH (ref 83.0–108.0)

## 2018-09-28 LAB — RAPID URINE DRUG SCREEN, HOSP PERFORMED
Amphetamines: NOT DETECTED
Barbiturates: NOT DETECTED
Benzodiazepines: NOT DETECTED
Cocaine: NOT DETECTED
Opiates: NOT DETECTED
Tetrahydrocannabinol: NOT DETECTED

## 2018-09-28 LAB — URINALYSIS, ROUTINE W REFLEX MICROSCOPIC
Bilirubin Urine: NEGATIVE
Glucose, UA: NEGATIVE mg/dL
Hgb urine dipstick: NEGATIVE
Ketones, ur: 5 mg/dL — AB
Leukocytes,Ua: NEGATIVE
Nitrite: NEGATIVE
Protein, ur: NEGATIVE mg/dL
Specific Gravity, Urine: >1.046 — ABNORMAL HIGH (ref 1.005–1.030)
pH: 5 (ref 5.0–8.0)

## 2018-09-28 LAB — GLUCOSE, CAPILLARY
Glucose-Capillary: 175 mg/dL — ABNORMAL HIGH (ref 70–99)
Glucose-Capillary: 191 mg/dL — ABNORMAL HIGH (ref 70–99)
Glucose-Capillary: 245 mg/dL — ABNORMAL HIGH (ref 70–99)
Glucose-Capillary: 177 mg/dL — ABNORMAL HIGH (ref 70–99)
Glucose-Capillary: 161 mg/dL — ABNORMAL HIGH (ref 70–99)
Glucose-Capillary: 182 mg/dL — ABNORMAL HIGH (ref 70–99)

## 2018-09-28 LAB — PHOSPHORUS: Phosphorus: 3.3 mg/dL (ref 2.5–4.6)

## 2018-09-28 LAB — MAGNESIUM: Magnesium: 1.8 mg/dL (ref 1.7–2.4)

## 2018-09-28 LAB — HEMOGLOBIN A1C
Hgb A1c MFr Bld: 8.9 % — ABNORMAL HIGH (ref 4.8–5.6)
Mean Plasma Glucose: 208.73 mg/dL

## 2018-09-28 LAB — TSH: TSH: 1.49 u[IU]/mL (ref 0.350–4.500)

## 2018-09-28 LAB — ECHOCARDIOGRAM COMPLETE: Weight: 3703.73 oz

## 2018-09-28 LAB — HIV ANTIBODY (ROUTINE TESTING W REFLEX): HIV Screen 4th Generation wRfx: NONREACTIVE

## 2018-09-28 MED ORDER — PRO-STAT SUGAR FREE PO LIQD
30.0000 mL | Freq: Three times a day (TID) | ORAL | Status: DC
  Administered 2018-09-28 – 2018-10-10 (×36): 30 mL
  Filled 2018-09-28 (×35): qty 30

## 2018-09-28 MED ORDER — NON FORMULARY: 1.0000 | Freq: Four times a day (QID) | Status: DC

## 2018-09-28 MED ORDER — GLUCERNA 1.5 CAL PO LIQD
237.0000 mL | Freq: Four times a day (QID) | ORAL | Status: DC
  Administered 2018-09-28 – 2018-10-03 (×18): 237 mL
  Filled 2018-09-28 (×28): qty 237

## 2018-09-28 MED ORDER — SODIUM CHLORIDE 0.9 % IV SOLN
INTRAVENOUS | Status: DC | PRN
  Administered 2018-10-03: 07:00:00 250 mL via INTRAVENOUS
  Administered 2018-10-05: 500 mL via INTRAVENOUS

## 2018-09-28 MED ORDER — ORAL CARE MOUTH RINSE
15.0000 mL | Freq: Two times a day (BID) | OROMUCOSAL | Status: DC
  Administered 2018-09-28 – 2018-09-29 (×3): 15 mL via OROMUCOSAL

## 2018-09-28 MED ORDER — PANTOPRAZOLE SODIUM 40 MG PO PACK
40.0000 mg | PACK | Freq: Every day | ORAL | Status: DC
  Administered 2018-09-28 – 2018-10-10 (×11): 40 mg
  Filled 2018-09-28 (×12): qty 20

## 2018-09-28 MED ORDER — ASPIRIN 300 MG RE SUPP
300.0000 mg | Freq: Every day | RECTAL | Status: DC
  Administered 2018-09-28 – 2018-09-30 (×2): 300 mg via RECTAL
  Filled 2018-09-28 (×2): qty 1

## 2018-09-28 MED ORDER — ASPIRIN EC 325 MG PO TBEC
325.0000 mg | DELAYED_RELEASE_TABLET | Freq: Every day | ORAL | Status: DC
  Administered 2018-09-29: 09:00:00 325 mg via ORAL
  Filled 2018-09-28 (×2): qty 1

## 2018-09-28 MED ORDER — PANTOPRAZOLE SODIUM 40 MG PO TBEC: 40.0000 mg | DELAYED_RELEASE_TABLET | Freq: Every day | ORAL | Status: DC

## 2018-09-28 MED ORDER — SODIUM CHLORIDE 3 % IV SOLN
INTRAVENOUS | Status: DC
  Administered 2018-09-28 (×2): 75 mL/h via INTRAVENOUS
  Administered 2018-09-28: 20 mL/h via INTRAVENOUS
  Administered 2018-09-29 (×2): 75 mL/h via INTRAVENOUS
  Filled 2018-09-28 (×5): qty 500

## 2018-09-28 MED ORDER — LABETALOL HCL 5 MG/ML IV SOLN
10.0000 mg | INTRAVENOUS | Status: DC | PRN
  Administered 2018-09-29 – 2018-09-30 (×5): 20 mg via INTRAVENOUS
  Filled 2018-09-28 (×5): qty 4

## 2018-09-28 MED ORDER — CLEVIDIPINE BUTYRATE 0.5 MG/ML IV EMUL
0.0000 mg/h | INTRAVENOUS | Status: DC
  Administered 2018-09-28: 18:00:00 8 mg/h via INTRAVENOUS
  Administered 2018-09-28: 21:00:00 6 mg/h via INTRAVENOUS
  Administered 2018-09-29: 04:00:00 4 mg/h via INTRAVENOUS
  Administered 2018-09-29 (×2): 10 mg/h via INTRAVENOUS
  Filled 2018-09-28 (×2): qty 100
  Filled 2018-09-28: qty 50

## 2018-09-28 MED ORDER — HEPARIN SODIUM (PORCINE) 5000 UNIT/ML IJ SOLN
5000.0000 [IU] | Freq: Three times a day (TID) | INTRAMUSCULAR | Status: DC
  Administered 2018-09-28 – 2018-10-09 (×33): 5000 [IU] via SUBCUTANEOUS
  Filled 2018-09-28 (×32): qty 1

## 2018-09-28 MED ORDER — PANTOPRAZOLE SODIUM 40 MG PO TBEC
40.0000 mg | DELAYED_RELEASE_TABLET | Freq: Every day | ORAL | Status: DC
  Administered 2018-10-02 – 2018-10-03 (×2): 40 mg via ORAL
  Filled 2018-09-28 (×3): qty 1

## 2018-09-28 NOTE — Evaluation (Signed)
Clinical/Bedside Swallow Evaluation Patient Details  Name: Johnathan Archer MRN: [id number] Date of Birth: 1971-02-28  Today's Date: 09/28/2018 Time: SLP Start Time (ACUTE ONLY): 1410 SLP Stop Time (ACUTE ONLY): 1418 SLP Time Calculation (min) (ACUTE ONLY): 8 min  Past Medical History:  Past Medical History:  Diagnosis Date  . Atrial fibrillation with RVR (HCC) 10/2011   lone episode, converted with IV dilt, on coumadin given elevated CHADS2  . Chest pain 03/06/2015   Myoview 07/2018: EF 33, no ischemia (EF normal by recent echo)  . Chronic combined systolic and diastolic CHF (congestive heart failure) (HCC)    Echo 2014: EF 20-25 // Echo 07/2018: mild LVE, EF 55, normal RVSF, mod LAE, mod MR, mild PI, mild aortic root dilation (38 mm)  . ED (erectile dysfunction)   . History of chicken pox   . History of noncompliance with medical treatment 03/06/2015  . Hypertension   . Hypertensive heart disease with heart failure (HCC) 03/28/2016  . Hyperthyroidism 03/15/2016  . Long term current use of anticoagulant therapy 01/14/2013  . Mitral regurgitation    Moderate by echo 07/2011  . Nonischemic cardiomyopathy (HCC) 12/27/2012  . Normocytic anemia 03/15/2016  . Obesity (BMI 30-39.9) 01/21/2014  . OSA (obstructive sleep apnea)   . Paroxysmal atrial fibrillation (HCC) 10/19/2011   lone episode, converted with IV dilt, on coumadin given elevated CHADS2   . 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  cath 2004 negative for obstruction, last echo 07/2011 EF 35%. b) Normal coronary arteries by cath in 2004 (when EF was noted at 15%). c) Last EF 20% by echo 12/2012  . Type II diabetes mellitus, uncontrolled (HCC)   . Type II or unspecified type diabetes mellitus without mention of complication, uncontrolled   . Warfarin anticoagulation    Past Surgical History:  Past Surgical History:  Procedure Laterality Date  . CARDIAC  CATHETERIZATION  2004   no blockages  . HERNIA REPAIR  2002  . KNEE SURGERY     L knee in HS  . US ECHOCARDIOGRAPHY  07/2011   mod LVH, EF 35%, diffuse hypokinesis of entire myocardium, irreversible restrictive pattern (grade 4 diastolic dysfunction), mod MR, mod dilated LA/RA, decreased RV systolic fxn, no PFO  . US ECHOCARDIOGRAPHY  12/2012   severe LVH, EF 20-25%, mod MR, reduced RV systolic function   HPI:  47 yoM, LSW 1000 am, presenting with left sided paralysis and right gaze found to have acute right MCA M1 occlusion.  Difficult intubation for airway protection.  Taken for EVR with successful revascularization.  Intubated from 4/9 to 4/10. MRI shows Large acute right MCA territory infarct: Restricted diffusion seen involving the right frontal, temporal, and parietal lobes, compatible with acute right MCA territory infarct. Involvement of the right basal ganglia and internal capsule, with patchy involvement of the right centrum semi ovale. Associated regional mass effect with mild attenuation of the right lateral ventricle. Associated edema and mild regional mass effect without significant midline shift. Scattered small volume petechial hemorrhage without associated hemorrhagic transformation.   Assessment / Plan / Recommendation Clinical Impression  Pt demonstrates significant oral dysphagia secondary to right CN V, VII and XII weakness and probable X deficits as well. Pt intubated only breifly, but vocal quality hoarse, cough very weak and ineffective. Pt has anterior spillage and decreased sensation of ice in right buccal cavity. Pt is at high risk of aspiration at this time  and is likely to need objective testing as he continues to recover. Alternative means of nutrition are recommended at this time, check for improved sustained arousal and stronger cough/vocal quality for MBS readiness.  SLP Visit Diagnosis: Dysphagia, oropharyngeal phase (R13.12)    Aspiration Risk  Severe aspiration  risk    Diet Recommendation NPO;Alternative means - temporary        Other  Recommendations     Follow up Recommendations        Frequency and Duration min 2x/week  2 weeks       Prognosis        Swallow Study   General HPI: 47 yoM, LSW 1000 am, presenting with left sided paralysis and right gaze found to have acute right MCA M1 occlusion.  Difficult intubation for airway protection.  Taken for EVR with successful revascularization.  Intubated from 4/9 to 4/10. MRI shows Large acute right MCA territory infarct: Restricted diffusion seen involving the right frontal, temporal, and parietal lobes, compatible with acute right MCA territory infarct. Involvement of the right basal ganglia and internal capsule, with patchy involvement of the right centrum semi ovale. Associated regional mass effect with mild attenuation of the right lateral ventricle. Associated edema and mild regional mass effect without significant midline shift. Scattered small volume petechial hemorrhage without associated hemorrhagic transformation. Type of Study: Bedside Swallow Evaluation Diet Prior to this Study: NPO Respiratory Status: Nasal cannula History of Recent Intubation: Yes Length of Intubations (days): 2 days Date extubated: 09/28/18 Behavior/Cognition: Alert;Cooperative Oral Care Completed by SLP: Yes Oral Cavity - Dentition: Adequate natural dentition Vision: Impaired for self-feeding Self-Feeding Abilities: Needs assist Patient Positioning: Upright in bed Baseline Vocal Quality: Hoarse;Breathy;Low vocal intensity Volitional Cough: Weak Volitional Swallow: Able to elicit    Oral/Motor/Sensory Function Overall Oral Motor/Sensory Function: Severe impairment Facial ROM: Reduced right;Suspected CN VII (facial) dysfunction Facial Symmetry: Abnormal symmetry right;Suspected CN VII (facial) dysfunction Facial Strength: Reduced right;Suspected CN VII (facial) dysfunction Facial Sensation: Reduced  right;Suspected CN V (Trigeminal) dysfunction Lingual ROM: Reduced right;Suspected CN XII (hypoglossal) dysfunction Lingual Symmetry: Abnormal symmetry right;Suspected CN XII (hypoglossal) dysfunction Lingual Strength: Reduced Lingual Sensation: Reduced   Ice Chips Ice chips: Impaired Presentation: Spoon Oral Phase Impairments: Reduced labial seal;Reduced lingual movement/coordination Pharyngeal Phase Impairments: Cough - Delayed;Wet Vocal Quality   Thin Liquid Thin Liquid: Impaired Pharyngeal  Phase Impairments: Wet Vocal Quality;Cough - Immediate    Nectar Thick Nectar Thick Liquid: Not tested   Honey Thick Honey Thick Liquid: Not tested   Puree Puree: Not tested   Solid     Solid: Not tested     Harlon Ditty, MA CCC-SLP  Acute Rehabilitation Services Pager [phone] Office [phone]  Valerya Maxton, Riley Nearing 09/28/2018,3:37 PM

## 2018-09-28 NOTE — Progress Notes (Signed)
PT Cancellation Note  Patient Details Name: Johnathan Archer MRN: [id number] DOB: Jan 22, 1971   Cancelled Treatment:    Reason Eval/Treat Not Completed: Patient not medically ready (per discussion with RN, may extubate today).  Laurina Bustle, PT, DPT Acute Rehabilitation Services Pager [phone] Office [phone]    Vanetta Mulders 09/28/2018, 9:25 AM

## 2018-09-28 NOTE — Procedures (Signed)
Cortrak  Person Inserting Tube:  Dao Mearns, RD Tube Type:  Cortrak - 43 inches Tube Location:  Right nare Initial Placement:  Stomach Secured by: Bridle Technique Used to Measure Tube Placement:  Documented cm marking at nare/ corner of mouth Cortrak Secured At:  73 cm    No x-ray is required. RN may begin using tube.   If the tube becomes dislodged please keep the tube and contact the Cortrak team at [email or web address] (password [email or web address]) for replacement.  If after hours and replacement cannot be delayed, place a NG tube and confirm placement with an abdominal x-ray.    Jessikah Dicker RD, LDN Clinical Nutrition Pager # - [phone]    

## 2018-09-28 NOTE — Progress Notes (Signed)
STROKE TEAM PROGRESS NOTE   INTERVAL HISTORY Pt still intubated but open eyes on voice and able to follow simple commands on the right. Plan for extubation today. However, he dose have right partial ptosis and right pupil dilation but reserved pupillary reflex. Right gaze nystagmus persistent. Concerning for brainstem injury. With MRI images, it does show that pt has right temporal pole edema, compressing on the right cerebral peduncle. OK to continue 3% saline.   Vitals:   09/28/18 0645 09/28/18 0700 09/28/18 0715 09/28/18 0900  BP: 125/75 131/77 131/72   Pulse: 81 83 84   Resp: (!) 21 20 (!) 21   Temp:    98.7 F (37.1 C)  TempSrc:    Axillary  SpO2: 98% 99% 98%   Weight:        CBC:  Recent Labs  Lab 09/19/2018 1248  09/28/18 0224 09/28/18 0316  WBC 7.3  --  9.7  --   NEUTROABS 5.8  --  8.1*  --   HGB 12.5*   < > 11.9* 11.9*  HCT 38.7*   < > 37.0* 35.0*  MCV 97.7  --  97.1  --   PLT 241  --  187  --    < > = values in this interval not displayed.    Basic Metabolic Panel:  Recent Labs  Lab 09/23/2018 1248 09/19/2018 1252  09/28/18 0224 09/28/18 0316 09/28/18 0655  NA 134*  --    < > 139 142 142  K 4.6  --    < > 3.9 3.9  --   CL 100  --   --  107  --   --   CO2 21*  --   --  20*  --   --   GLUCOSE 434*  --   --  186*  --   --   BUN 25*  --   --  25*  --   --   CREATININE 1.42* 1.30*  --  1.51*  --   --   CALCIUM 9.2  --   --  8.4*  --   --   MG  --   --   --  1.8  --   --   PHOS  --   --   --  3.3  --   --    < > = values in this interval not displayed.   Lipid Panel:     Component Value Date/Time   CHOL 134 09/28/2018 0224   TRIG 40 09/28/2018 0224   HDL 53 09/28/2018 0224   CHOLHDL 2.5 09/28/2018 0224   VLDL 8 09/28/2018 0224   LDLCALC 73 09/28/2018 0224   HgbA1c:  Lab Results  Component Value Date   HGBA1C 8.9 (H) 09/28/2018   Urine Drug Screen:     Component Value Date/Time   LABOPIA NONE DETECTED 09/28/2018 0230   COCAINSCRNUR NONE DETECTED  09/28/2018 0230   COCAINSCRNUR NEGATIVE 11/10/2011 0739   LABBENZ NONE DETECTED 09/28/2018 0230   LABBENZ NEGATIVE 11/10/2011 0739   AMPHETMU NONE DETECTED 09/28/2018 0230   THCU NONE DETECTED 09/28/2018 0230   LABBARB NONE DETECTED 09/28/2018 0230    Alcohol Level     Component Value Date/Time   ETH <10 09/30/2018 1248    IMAGING Ct Angio Head W Or Wo Contrast  Result Date: 10/09/2018 CLINICAL DATA:  Stroke.  Left-sided weakness EXAM: CT ANGIOGRAPHY HEAD AND NECK TECHNIQUE: Multidetector CT imaging of the head and neck was performed using  the standard protocol during bolus administration of intravenous contrast. Multiplanar CT image reconstructions and MIPs were obtained to evaluate the vascular anatomy. Carotid stenosis measurements (when applicable) are obtained utilizing NASCET criteria, using the distal internal carotid diameter as the denominator. CONTRAST:  75mL OMNIPAQUE IOHEXOL 350 MG/ML SOLN COMPARISON:  CT head 09/23/2018 FINDINGS: CTA NECK FINDINGS Aortic arch: Standard branching. Imaged portion shows no evidence of aneurysm or dissection. No significant stenosis of the major arch vessel origins. Right carotid system: Right carotid system widely patent. No stenosis or atherosclerotic disease Left carotid system: Left carotid system widely patent. No stenosis or atherosclerotic disease Vertebral arteries: Both vertebral arteries are widely patent without significant stenosis or atherosclerotic disease. Skeleton: No acute skeletal abnormality. Other neck: Negative for mass or soft tissue swelling in the neck. Upper chest: Lung apices clear bilaterally. Review of the MIP images confirms the above findings CTA HEAD FINDINGS Anterior circulation: Right cavernous carotid patent. There is occlusion of the supraclinoid internal carotid artery just proximal to the bifurcation. Clot extends into the right middle cerebral artery M1 which is occluded. There is little flow in right MCA branches. Small  amount of clot in the proximal right A1 segment which shows good flow possibly from the left side. Both anterior cerebral arteries are patent Left cavernous carotid widely patent. Left anterior and middle cerebral arteries widely patent without stenosis. Posterior circulation: No significant stenosis, proximal occlusion, aneurysm, or vascular malformation. 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 Poor flow in right MCA branches. Small amount of clot in right A1 segment. Findings compatible with acute embolus. No significant atherosclerotic disease in the neck or head. These results were called by telephone at the time of interpretation on 10/02/2018 at 1:19 pm to Dr. Arther Dames , who verbally acknowledged these results. Electronically Signed   By: Marlan Palau M.D.   On: 09/25/2018 13:21   Ct Head Wo Contrast  Result Date: 09/26/2018 CLINICAL DATA:  Stroke follow-up EXAM: CT HEAD WITHOUT CONTRAST TECHNIQUE: Contiguous axial images were obtained from the base of the skull through the vertex without intravenous contrast. COMPARISON:  CTA head neck 10/08/2018 FINDINGS: Brain: There is hyperdensity within the right basal ganglia. No midline shift. Mild mass effect on the right lateral ventricle. Left hemisphere is normal. Vascular: Intravascular enhancement secondary to recent contrast administration. Skull: The visualized skull base, calvarium and extracranial soft tissues are normal. Sinuses/Orbits: No fluid levels or advanced mucosal thickening of the visualized paranasal sinuses. No mastoid or middle ear effusion. The orbits are normal. IMPRESSION: Hyperdensity within the right basal ganglia may indicate contrast staining or petechial hemorrhage. No significant mass effect.  Electronically Signed   By: Deatra Robinson M.D.   On: 10/02/2018 16:33   Ct Angio Neck W Or Wo Contrast  Result Date: 10/02/2018 CLINICAL DATA:  Stroke.  Left-sided weakness EXAM: CT ANGIOGRAPHY HEAD AND NECK TECHNIQUE: Multidetector CT imaging of the head and neck was performed using the standard protocol during bolus administration of intravenous contrast. Multiplanar CT image reconstructions and MIPs were obtained to evaluate the vascular anatomy. Carotid stenosis measurements (when applicable) are obtained utilizing NASCET criteria, using the distal internal carotid diameter as the denominator. CONTRAST:  75mL OMNIPAQUE IOHEXOL 350 MG/ML SOLN COMPARISON:  CT head 10/10/2018 FINDINGS: CTA NECK FINDINGS Aortic  arch: Standard branching. Imaged portion shows no evidence of aneurysm or dissection. No significant stenosis of the major arch vessel origins. Right carotid system: Right carotid system widely patent. No stenosis or atherosclerotic disease Left carotid system: Left carotid system widely patent. No stenosis or atherosclerotic disease Vertebral arteries: Both vertebral arteries are widely patent without significant stenosis or atherosclerotic disease. Skeleton: No acute skeletal abnormality. Other neck: Negative for mass or soft tissue swelling in the neck. Upper chest: Lung apices clear bilaterally. Review of the MIP images confirms the above findings CTA HEAD FINDINGS Anterior circulation: Right cavernous carotid patent. There is occlusion of the supraclinoid internal carotid artery just proximal to the bifurcation. Clot extends into the right middle cerebral artery M1 which is occluded. There is little flow in right MCA branches. Small amount of clot in the proximal right A1 segment which shows good flow possibly from the left side. Both anterior cerebral arteries are patent Left cavernous carotid widely patent. Left anterior and middle cerebral arteries widely patent without stenosis. Posterior  circulation: No significant stenosis, proximal occlusion, aneurysm, or vascular malformation. 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 Poor flow in right MCA branches. Small amount of clot in right A1 segment. Findings compatible with acute embolus. No significant atherosclerotic disease in the neck or head. These results were called by telephone at the time of interpretation on 10/06/2018 at 1:19 pm to Dr. Arther Dames , who verbally acknowledged these results. Electronically Signed   By: Marlan Palau M.D.   On: 09/29/2018 13:21   Mr Maxine Glenn Head Wo Contrast  Result Date: 09/28/2018 CLINICAL DATA:  Initial evaluation for acute stroke, found to have right M1 occlusion, status post catheter directed revascularization. EXAM: MRI HEAD WITHOUT CONTRAST MRA HEAD WITHOUT CONTRAST TECHNIQUE: Multiplanar, multiecho pulse sequences of the brain and surrounding structures were obtained without intravenous contrast. Angiographic images of the head were obtained using MRA technique without contrast. COMPARISON:  Prior CTs from 10/04/2018 FINDINGS: MRI HEAD FINDINGS Brain: Cerebral volume within normal limits for age. Mild for age chronic microvascular ischemic changes present within the periventricular white matter. Small focus of encephalomalacia at the lateral right cerebellum likely related to prior ischemia. Restricted diffusion seen involving the right frontal, temporal, and parietal lobes, compatible with acute right MCA territory infarct. Involvement of the right basal ganglia and internal capsule, with patchy involvement of the right centrum semi ovale. Associated regional mass effect with mild attenuation of the right lateral ventricle. No significant midline shift at this time. No  hydrocephalus or ventricular trapping. Basilar cisterns are patent. Scattered petechial hemorrhage within the area of infarction, primarily involving the basal ganglia and insular/subinsular region. No frank hemorrhagic transformation. Additional punctate acute ischemic nonhemorrhagic cortical infarct noted at the anterior left frontal lobe (series 5, image 88). No other evidence for acute or subacute ischemia. Gray-white matter differentiation otherwise maintained. No other evidence for acute or chronic intracranial hemorrhage. No mass lesion, midline shift or mass effect. No extra-axial fluid collection. Pituitary gland suprasellar region normal. Midline structures intact. Vascular: Major intracranial vascular flow voids are maintained. Skull and upper cervical spine: Craniocervical junction normal. Upper cervical spine within normal limits. Bone marrow signal intensity normal. No scalp soft tissue abnormality. Sinuses/Orbits: Globes and orbital soft tissues within normal limits. Moderate mucosal thickening  throughout the ethmoidal air cells, right sphenoid sinus, and right greater than left maxillary sinuses. Fluid seen within the nasopharynx. Patient likely intubated. Trace opacity noted within the bilateral mastoid air cells. Inner ear structures normal. Other: None. MRA HEAD FINDINGS ANTERIOR CIRCULATION: Distal cervical segments of the internal carotid arteries are patent with symmetric antegrade flow. Petrous, cavernous, and supraclinoid segments patent without flow-limiting stenosis. A1 segments patent bilaterally. Normal anterior communicating artery. Anterior cerebral arteries patent to their distal aspects without stenosis. M1 segments widely patent bilaterally. Previously seen right M1 occlusion has been revascularized. Normal MCA bifurcations. Symmetric and robust perfusion seen within the MCA branches bilaterally. POSTERIOR CIRCULATION: Vertebral arteries patent to the vertebrobasilar junction without  stenosis. Patent left PICA. Right PICA not seen. Basilar patent to its distal aspect without stenosis. Superior cerebral arteries patent bilaterally. Both of the posterior cerebral arteries supplied via the basilar as well small posterior communicating arteries. PCAs widely patent to their distal aspects. No intracranial aneurysm. IMPRESSION: MRI HEAD IMPRESSION: 1. Large acute right MCA territory infarct as above. Associated edema and mild regional mass effect without significant midline shift. Scattered small volume petechial hemorrhage without associated hemorrhagic transformation. 2. 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 3. Underlying mild chronic small vessel ischemic disease. MRA HEAD IMPRESSION: Normal intracranial MRA. Previously seen right M1 occlusion has been cleared, with wide patency and robust perfusion now seen throughout the right MCA and its branches. Electronically Signed   By: Rise Mu M.D.   On: 09/28/2018 01:33   Mr Brain Wo Contrast  Result Date: 09/28/2018 CLINICAL DATA:  Initial evaluation for acute stroke, found to have right M1 occlusion, status post catheter directed revascularization. EXAM: MRI HEAD WITHOUT CONTRAST MRA HEAD WITHOUT CONTRAST TECHNIQUE: Multiplanar, multiecho pulse sequences of the brain and surrounding structures were obtained without intravenous contrast. Angiographic images of the head were obtained using MRA technique without contrast. COMPARISON:  Prior CTs from 10/06/2018 FINDINGS: MRI HEAD FINDINGS Brain: Cerebral volume within normal limits for age. Mild for age chronic microvascular ischemic changes present within the periventricular white matter. Small focus of encephalomalacia at the lateral right cerebellum likely related to prior ischemia. Restricted diffusion seen involving the right frontal, temporal, and parietal lobes, compatible with acute right MCA territory infarct. Involvement of the right basal ganglia and internal capsule,  with patchy involvement of the right centrum semi ovale. Associated regional mass effect with mild attenuation of the right lateral ventricle. No significant midline shift at this time. No hydrocephalus or ventricular trapping. Basilar cisterns are patent. Scattered petechial hemorrhage within the area of infarction, primarily involving the basal ganglia and insular/subinsular region. No frank hemorrhagic transformation. Additional punctate acute ischemic nonhemorrhagic cortical infarct noted at the anterior left frontal lobe (series 5, image 88). No other evidence for acute or subacute ischemia. Gray-white matter differentiation otherwise maintained. No other evidence for acute or chronic intracranial hemorrhage. No mass lesion, midline shift or mass effect. No extra-axial fluid collection. Pituitary gland suprasellar region normal. Midline structures intact. Vascular: Major intracranial vascular flow voids are maintained. Skull and upper cervical spine: Craniocervical junction normal. Upper cervical spine within normal limits. Bone marrow signal intensity normal. No scalp soft tissue abnormality. Sinuses/Orbits: Globes and orbital soft tissues within normal limits. Moderate mucosal thickening throughout the ethmoidal air cells, right sphenoid sinus, and right greater than left maxillary sinuses. Fluid seen within the nasopharynx. Patient likely intubated. Trace opacity noted within the bilateral mastoid air cells. Inner ear structures normal. Other: None. MRA HEAD FINDINGS  ANTERIOR CIRCULATION: Distal cervical segments of the internal carotid arteries are patent with symmetric antegrade flow. Petrous, cavernous, and supraclinoid segments patent without flow-limiting stenosis. A1 segments patent bilaterally. Normal anterior communicating artery. Anterior cerebral arteries patent to their distal aspects without stenosis. M1 segments widely patent bilaterally. Previously seen right M1 occlusion has been  revascularized. Normal MCA bifurcations. Symmetric and robust perfusion seen within the MCA branches bilaterally. POSTERIOR CIRCULATION: Vertebral arteries patent to the vertebrobasilar junction without stenosis. Patent left PICA. Right PICA not seen. Basilar patent to its distal aspect without stenosis. Superior cerebral arteries patent bilaterally. Both of the posterior cerebral arteries supplied via the basilar as well small posterior communicating arteries. PCAs widely patent to their distal aspects. No intracranial aneurysm. IMPRESSION: MRI HEAD IMPRESSION: 1. Large acute right MCA territory infarct as above. Associated edema and mild regional mass effect without significant midline shift. Scattered small volume petechial hemorrhage without associated hemorrhagic transformation. 2. 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 3. Underlying mild chronic small vessel ischemic disease. MRA HEAD IMPRESSION: Normal intracranial MRA. Previously seen right M1 occlusion has been cleared, with wide patency and robust perfusion now seen throughout the right MCA and its branches. Electronically Signed   By: Rise MuBenjamin  McClintock M.D.   On: 09/28/2018 01:33   Ct Head Code Stroke Wo Contrast  Result Date: 10/04/2018 CLINICAL DATA:  Code stroke. Left-sided weakness. On Xarelto for atrial fibrillation EXAM: CT HEAD WITHOUT CONTRAST TECHNIQUE: Contiguous axial images were obtained from the base of the skull through the vertex without intravenous contrast. COMPARISON:  CT head 11/14/2017 FINDINGS: Brain: Ill-defined hypodensity right basal ganglia compatible with early infarction involving the inferior basal ganglia, including the putamen and external capsule and possibly the inferior insula. Negative for hemorrhage or mass effect. No midline shift. Ventricle size normal. Vascular: Extensive hyperdense right MCA involving the entire right M1 extending into the right M2. Other vessels negative Skull: Negative  Sinuses/Orbits: Mild mucosal edema right maxillary sinus. Normal orbit Multiple dental caries Other: None ASPECTS ([place] Stroke Program Early CT Score) - Ganglionic level infarction (caudate, lentiform nuclei, internal capsule, insula, M1-M3 cortex): 5 - Supraganglionic infarction (M4-M6 cortex): 3 Total score (0-10 with 10 being normal): 8 IMPRESSION: 1. Hyperdense right MCA compatible with acute embolus. Early infarct in the right basal ganglia and insula 2. ASPECTS is 8 3. These results were called by telephone at the time of interpretation on 10/10/2018 at 1:08 pm to Dr. Laurence SlateAroor , who verbally acknowledged these results. Electronically Signed   By: Marlan Palauharles  Clark M.D.   On: 27-Oct-2018 13:10   Cerebral Angiogram 09/24/2018 S/P rt common carotid arteriogram,followed by complete revascularization of RT MCA M 1 occlusion with x 2 passes with 5mm x 33mm embotrap retriever and x 2 passes with th the solitaire 4mm x 40 mmx retriever device achieving a TICI 2 b revascularization.  2D Echocardiogram   1. The left ventricle has normal systolic function with an ejection fraction of 60-65%. The cavity size was normal. There is mildly increased left ventricular wall thickness. Left ventricular diastolic parameters were normal.  2. The right ventricle has normal systolic function. The cavity was normal. There is no increase in right ventricular wall thickness.  3. Negative bubble study for right to left shunt.  4. Trivial pericardial effusion is present.  5. Mild thickening of the mitral valve leaflet.  6. The tricuspid valve is grossly normal.  7. The aortic valve is tricuspid. Mild thickening of the aortic valve. Sclerosis without any evidence  of stenosis of the aortic valve.   PHYSICAL EXAM  Temp:  [95.4 F (35.2 C)-99.6 F (37.6 C)] 99.6 F (37.6 C) (04/10 1100) Pulse Rate:  [59-93] 92 (04/10 1300) Resp:  [13-25] 23 (04/10 1100) BP: (97-166)/(56-102) 129/81 (04/10 1300) SpO2:  [98 %-100 %] 98 % (04/10  1300) Arterial Line BP: (103-163)/(54-83) 133/67 (04/10 1200) FiO2 (%):  [30 %-50 %] 30 % (04/10 0836)  General - Well nourished, well developed, intubated off sedation.  Ophthalmologic - fundi not visualized due to noncooperation.  Cardiovascular - Regular rate and rhythm.  Neuro - intubated off sedation, eyes open, following simple commands. Right partial ptosis, eyes in mid position, sustained nystagmus with right gaze, left gaze incomplete, right pupil 3.63mm and left 2mm, both reactive to light. Blinking to visual threat on the right, not on the left, able to track bilaterally. Corneal reflex present, gag and cough present. Breathing over the vent.  Facial symmetry not able to test due to ET tube.  Tongue midline in mouth. Spontaneous moving RUE and RLE against gravity, RUE 4/5, RLE 3/5. LUE 0/5 and LLE slight withdraw on pain stimulation. DTR 1+ and left babinski. Sensation symmetrical subjectively, coordination not cooperative and gait not tested.   ASSESSMENT/PLAN Johnathan Archer is a 48 y.o. male with history of HTN, DB, CHF and AF on Xarelto found down at work, presenting with L hemiparesis and R gaze.   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 Early infarct R BG and insula.    CTA head & neck ELO R ICA, R M1, R M2. R A1 clot.   Cerebral angio R M1 TICI2b reperfusion  CT head hyperdensity R BG, contrast vs hmg. No mass effect  MRI  Large R MCA infarct, associated edema and mass effect w/o shift. prob small R cerebellar infarct. Small vessel disease.   MRA  Normal, previous R M1 open  2D Echo EF 60-65%. No source of embolus   LDL 73  HgbA1c 8.9  SCDs or VTE prophylaxis Xarelto (rivaroxaban) daily prior to admission, now on No antithrombotic. Will start ASA PR today but consider resume DOAC in 5-7 days post stroke.   Therapy  recommendations:  pending   Disposition:  pending   Acute Respiratory Failure Difficult airway  Intubated in ED for IR  extubation today  CCM onboard  Right cerebral peduncle compression  Pt with right ptosis, right pupil dilation with reserved reflex, right gaze sustained nystagmus  MRI showed right temporal pole edema with cerebral peduncle compression  continue 3% at this time via peripheral - consider PICC if prolonged 3% use  Na 142->142  Goal Na 145-155  Check Na q6h  CT repeat in am  Atrial Fibrillation w/ RVR  Home anticoagulation:  Xarelto (rivaroxaban) daily  . Home meds: amiodarone 200 . Rate controlled now . On ASA PR  consider resume DOAC in 5-7 days post stroke.   Chronic combined systolic and diastolic CHF  Home meds: Coreg 37.5 bid, lasix 60 bid, hydralazine 50 bid, isosorbide 30, entresto bid  CXR 10/08/2018 lungs clear  Consider to resume once po access if appropriate  Hypertension  Home meds:  Coreg 37.5 bid, lasix 60 bid, hydralazine 50 bid, isosorbide 30, entresto bid  BP goal 120-140 x 24h post IR  On cleviprex  Consider to resume po BP meds  once po access  Wean off as able . Long-term BP goal normotensive  Hyperlipidemia  Home meds:  No statin  LDL 73, goal < 70  Add statin once able to swallow   Continue statin at discharge  Diabetes type II   Home meds:  Metformin 500 bid, insulin degludec 8u  HgbA1c 8.9, goal < 7.0  Uncontrolled  hyperglycemia  CBGs  SSI  Other Stroke Risk Factors  Obesity, Body mass index is 32.29 kg/m., recommend weight loss, diet and exercise as appropriate   Obstructive sleep apnea  Nonischemic CM  Other Active Problems  CKD stage II Cr 1.42->1.51  Hospital day # 1  This patient is critically ill due to right MCA large infarct, A. fib RVR, diabetes, hyperglycemia, CHF and at significant risk of neurological worsening, death form recurrent stroke, hemorrhagic conversion, heart  failure, seizure, sepsis, aspiration pneumonia. This patient's care requires constant monitoring of vital signs, hemodynamics, respiratory and cardiac monitoring, review of multiple databases, neurological assessment, discussion with family, other specialists and medical decision making of high complexity. I spent 40 minutes of neurocritical care time in the care of this patient.  Marvel Plan, MD PhD Stroke Neurology 09/28/2018 3:27 PM    To contact Stroke Continuity provider, please refer to [email or web address]. After hours, contact General Neurology

## 2018-09-28 NOTE — Progress Notes (Signed)
SLP Cancellation Note  Patient Details Name: Johnathan Archer MRN: [id number] DOB: 10-Nov-1970   Cancelled treatment:       Reason Eval/Treat Not Completed: Medical issues which prohibited therapy;Patient not medically ready. Pt is currently intubated. SLP will follow up.   Danelly Hassinger I. Vear Clock, MS, CCC-SLP Acute Rehabilitation Services Office number [phone] Pager [phone]  Scheryl Marten 09/28/2018, 8:47 AM

## 2018-09-28 NOTE — Progress Notes (Signed)
NAME:  Johnathan Archer, MRN:  [id number], DOB:  [birth year], LOS: 1 ADMISSION DATE:  05-Oct-2018, CONSULTATION DATE:  2018/10/05 REFERRING MD:  Dr. Loni Beckwith, CHIEF COMPLAINT:  CVA  Brief History   48 yoM, LSW 1000 am, presenting with left sided paralysis and right gaze found to have acute right MCA M1 occlusion.  Difficult intubation for airway protection.  Taken for EVR with successful revascularization.    History of present illness   HPI obtained from medical chart review as patient is intubated/ sedated   48 year old male with history of Afib on xarelto, chronic systolic / dystolic HF, DMT2, HTN, MR, NICM, anemia, obesity, and OSA, last seen well around 1000 by coworkers found wedged between sink and bathroom stall at work.  Presented as code stroke with left sided paralysis and right gaze.  Hypertensive at 221/ 145.  Found on CT to have acute right MCA M1 occlusion.  Intubated for airway protection, was a difficult intubation.  Taken to IR with successful revascularization of right MCA M1 occlusion achieving TICI 2b revascularization.  Returns to ICU, PCCM to consult for ventilator management.   Past Medical History  Afib on xarelto, chronic systolic / dystolic HF, DMT2, HTN, MR, NICM, anemia, obesity, OSA, ED   Significant Hospital Events   4/9 Admitted / EVR  Consults:   Procedures:  4/9 ETT >> 4/9 R radial aline >> 4/9 cerebral angiogram   Significant Diagnostic Tests:  4/9 CTH >> 1. Hyperdense right MCA compatible with acute embolus. Early infarct in the right basal ganglia and insula 2. 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 Poor flow in right MCA branches. Small amount of clot in right A1 segment. Findings compatible with acute embolus. No significant atherosclerotic disease in the neck or head.  4/9 CTH post intervention >>  Hyperdensity within the right basal ganglia may indicate contrast staining or petechial hemorrhage. No significant mass effect.  4/10 MRI brain >> evolving right MCA M1 stroke, culprit vessel is patent.  Some petechial changes without overt hemorrhagic conversion, no shift  Micro Data:   Antimicrobials:  4/9 ancef preop  Interim history/subjective:  Tolerating PSV currently. MRI brain from this morning as detailed below Adequate BP control Note rising serum creatinine  Objective   Blood pressure 131/72, pulse 84, temperature 98.7 F (37.1 C), temperature source Axillary, resp. rate (!) 21, weight 105 kg, SpO2 98 %.    Vent Mode: CPAP;PSV FiO2 (%):  [30 %-100 %] 30 % Set Rate:  [15 bmp-18 bmp] 18 bmp Vt Set:  [570 mL] 570 mL PEEP:  [5 cmH20] 5 cmH20 Pressure Support:  [5 cmH20] 5 cmH20 Plateau Pressure:  [16 cmH20-19 cmH20] 16 cmH20   Intake/Output Summary (Last 24 hours) at 09/28/2018 1009 Last data filed at 09/28/2018 0700 Gross per 24 hour  Intake 3048.4 ml  Output 1945 ml  Net 1103.4 ml   Filed Weights   2018-10-05 1320  Weight: 105 kg    Examination: General: Critically ill man, on mechanical ventilation, HEENT: ET tube in place, pupils equal, c-collar in place Neuro: Wakes easily to voice, tries to phonate but unintelligible, follows commands on the right, flaccid on the left.  Attempts to cough on command but weak CV: Regular, 2/6 systolic murmur PULM: Clear bilateral breath sounds GI: Soft, nondistended with positive bowel sounds Extremities: No significant  edema Skin: No rash  Resolved Hospital Problem list    Assessment & Plan:  Right MCA M1 occlusion s/p EVR with successful revascularization P:  Plans per stroke team and interventional radiology MRI from 4/10 with expected changes from right MCA stroke, no shift, some petechial change, patent vessel post procedure Discussed with Dr. Roda Shutters today, likely stop 3% NaCl 4/10 Systolic blood pressure goal  161-096, Cleviprex if needed Patient was found down, can likely clear his C-spine once he is extubated and can participate with exam  Acute respiratory insufficiency  Difficult airway OSA P:  Tolerating PS at this time.  Meets criteria for extubation although difficult to assess his swallowing coordination, airway protection with ET tube in place.  Attempt to extubate 4/10, follow closely, push pulmonary hygiene  At risk dysphasia SLP evaluation post extubation  Acute renal failure, consider hypertensive, contrast media P:  Follow urine output, BMP trend  Afib on xarelto P:  Xarelto on hold, defer to neurology as to when this can be restarted in light of the petechial changes on his MRI brain 4/10 Amiodarone to restart either per tube or p.o. when able to tolerate  Chronic systolic/ diastolic HF - 2/20 EF 55%, normal RV, mod MR, mild dilation of aortic root P:  Carvedilol, hydralazine, furosemide, Imdur, Aldactone, Entresto currently on hold Restart as able to tolerate.  Wean Cleviprex to off Echocardiogram pending  Hypertension, UDS negative P:  SBP goals as above Antihypertensives held as above  DMT2 P:  Sliding-scale insulin per protocol Home metformin and Tresiba insulin are on hold  Hx normocytic anemia P:  Follow CBC  Best practice:  Diet: NPO, SLP consult pending Pain/Anxiety/Delirium protocol (if indicated): propofol/ prn fentanyl VAP protocol (if indicated): 4/9 DVT prophylaxis: SCDs GI prophylaxis: PPI Glucose control: SSI Mobility: BR Code Status: Full Family Communication: Discussed with patient 4/10 Disposition: ICU  Labs   CBC: Recent Labs  Lab 09/20/2018 1248 10/15/2018 1716 10/12/2018 1919 09/28/18 0224 09/28/18 0316  WBC 7.3  --   --  9.7  --   NEUTROABS 5.8  --   --  8.1*  --   HGB 12.5* 10.5* 11.2* 11.9* 11.9*  HCT 38.7* 31.0* 33.0* 37.0* 35.0*  MCV 97.7  --   --  97.1  --   PLT 241  --   --  187  --     Basic Metabolic Panel:  Recent Labs  Lab 09/25/2018 1248 09/28/2018 1252 09/30/2018 1716 10/07/2018 1919 09/28/18 0224 09/28/18 0316 09/28/18 0655  NA 134*  --  141 144 139 142 142  K 4.6  --  3.4* 3.7 3.9 3.9  --   CL 100  --   --   --  107  --   --   CO2 21*  --   --   --  20*  --   --   GLUCOSE 434*  --   --   --  186*  --   --   BUN 25*  --   --   --  25*  --   --   CREATININE 1.42* 1.30*  --   --  1.51*  --   --   CALCIUM 9.2  --   --   --  8.4*  --   --   MG  --   --   --   --  1.8  --   --   PHOS  --   --   --   --  3.3  --   --    GFR: Estimated Creatinine Clearance: 73.8 mL/min (A) (by C-G formula based on SCr of 1.51 mg/dL (H)). Recent Labs  Lab 2018-10-03 1248 09/28/18 0224  WBC 7.3 9.7    Liver Function Tests: Recent Labs  Lab 10/03/2018 1248  AST 19  ALT 22  ALKPHOS 50  BILITOT 1.7*  PROT 7.3  ALBUMIN 3.3*   No results for input(s): LIPASE, AMYLASE in the last 168 hours. No results for input(s): AMMONIA in the last 168 hours.  ABG    Component Value Date/Time   PHART 7.410 09/28/2018 0316   PCO2ART 33.4 09/28/2018 0316   PO2ART 112.0 (H) 09/28/2018 0316   HCO3 21.3 09/28/2018 0316   TCO2 22 09/28/2018 0316   ACIDBASEDEF 3.0 (H) 09/28/2018 0316   O2SAT 99.0 09/28/2018 0316     Coagulation Profile: Recent Labs  Lab 2018-10-03 1248  INR 2.6*    Cardiac Enzymes: No results for input(s): CKTOTAL, CKMB, CKMBINDEX, TROPONINI in the last 168 hours.  HbA1C: Hgb A1c MFr Bld  Date/Time Value Ref Range Status  09/28/2018 02:24 AM 8.9 (H) 4.8 - 5.6 % Final    Comment:    (NOTE) Pre diabetes:          5.7%-6.4% Diabetes:              >6.4% Glycemic control for   <7.0% adults with diabetes   03/11/2017 07:41 PM 8.0 (H) 4.8 - 5.6 % Final    Comment:    (NOTE) Pre diabetes:          5.7%-6.4% Diabetes:              >6.4% Glycemic control for   <7.0% adults with diabetes     CBG: Recent Labs  Lab 2018/10/03 1931 10-03-2018 2001 2018/10/03 2331 09/28/18 0325 09/28/18 0827   GLUCAP 48* 128* 169* 175* 191*     Critical care time: 34 mins     Levy Pupa, MD, PhD 09/28/2018, 10:22 AM [facility] [phone] or if no answer [phone]

## 2018-09-28 NOTE — Progress Notes (Signed)
Video call conducted with wife and family. Pt waving at camera. Voice too soft to hear.

## 2018-09-28 NOTE — Evaluation (Addendum)
Physical Therapy Evaluation Patient Details Name: Johnathan Archer MRN: [id number] DOB: 1970-11-13 Today's Date: 09/28/2018   History of Present Illness  48 year old male with history of Afib, chronic systolic/dystolic HF, DMT2, HTN, MR, NICM, anemia, obesity, and OSA, found down by coworkers wedged between sink and bathroom stall at work.  Presented as code stroke with left sided paralysis and right gaze.  Hypertensive at 221/ 145.  Found on CT to have acute right MCA M1 occlusion. Pt Intubated for airway protection. Taken to IR with successful revascularization of right MCA M1 occlusion. Pt extubated 4/10.   Clinical Impression  Pt admitted with above. Pt lethargic during session, tending to keep eyes closed (extubated 1 hour prior to evaluation). However, pt following simple commands ~75% of the time, nodding yes/no to therapist questions. Pt requiring two person maximal assistance for bed mobility. Increased assist required to maintain RUE in lap as pt tends to push strongly with RUE. Limited mostly by increased secretions/coughing after ~5-10 minutes upright. Presents with decreased functional mobility secondary to left hemiparesis, balance deficits and communication impairments. Suspect pt will progress well once more awake given PLOF and age. Recommending CIR to maximize functional independence.     Follow Up Recommendations CIR;Supervision/Assistance - 24 hour    Equipment Recommendations  Other (comment)(TBD )    Recommendations for Other Services Rehab consult     Precautions / Restrictions Precautions Precautions: Fall Restrictions Weight Bearing Restrictions: No      Mobility  Bed Mobility Overal bed mobility: Needs Assistance Bed Mobility: Supine to Sit;Sit to Supine     Supine to sit: Max assist;+2 for physical assistance;+2 for safety/equipment Sit to supine: Total assist;+2 for physical assistance;+2 for safety/equipment   General bed mobility comments:  verbal cues for pt to use RUE/RLE to assist with reaching towards bedrail and moving LE over EOB, assist for fully achieving LEs over EOB and to elevate trunk, pt tends to push with RUE creating strong L lateral lean   Transfers                 General transfer comment: deferred this session  Ambulation/Gait                Stairs            Wheelchair Mobility    Modified Rankin (Stroke Patients Only) Modified Rankin (Stroke Patients Only) Pre-Morbid Rankin Score: No symptoms Modified Rankin: Severe disability     Balance Overall balance assessment: Needs assistance Sitting-balance support: Feet supported;Single extremity supported Sitting balance-Leahy Scale: Poor Sitting balance - Comments: mod-maxA for sitting balance; max multimodal cues to maintain R hand in his lap (requires physical assist to do so) as pt wants to push with his RUE creating strong L lateral lean Postural control: Left lateral lean                                   Pertinent Vitals/Pain Pain Assessment: Faces Faces Pain Scale: No hurt Pain Intervention(s): Monitored during session    Home Living Family/patient expects to be discharged to:: Private residence Living Arrangements: Spouse/significant other Available Help at Discharge: Family Type of Home: House Home Access: Stairs to enter     Home Layout: Two level        Prior Function Level of Independence: Independent         Comments: pt nodding head yes when asked if he was  working PTA     Hand Dominance   Dominant Hand: Right    Extremity/Trunk Assessment   Upper Extremity Assessment Upper Extremity Assessment: LUE deficits/detail LUE Deficits / Details: no trace movement noted today, digits tending to maintain flexed at rest - will continue to monitor for splinting needs LUE Sensation: decreased light touch LUE Coordination: decreased fine motor;decreased gross motor    Lower Extremity  Assessment Lower Extremity Assessment: LLE deficits/detail LLE Deficits / Details: no active movement noted       Communication   Communication: No difficulties  Cognition Arousal/Alertness: Lethargic Behavior During Therapy: Flat affect Overall Cognitive Status: Impaired/Different from baseline Area of Impairment: Attention;Following commands;Safety/judgement;Awareness                   Current Attention Level: Sustained   Following Commands: Follows one step commands with increased time;Follows one step commands inconsistently Safety/Judgement: Decreased awareness of safety;Decreased awareness of deficits Awareness: Intellectual   General Comments: pt tending to maintain eyes closed, will open with max cues but only maintains briefly; pt approx 75% of one step commands given mulitmodal cues but requires repetition to maintain adherence to instruction; pt does not verbalize during session but nodding head yes/no to therapist questions, appears to be appropriate      General Comments General comments (skin integrity, edema, etc.): VSS throughout, pt with coughing spell sitting up requiring suction to clear secretions; pt also with swelling noted to RUE (suspect due to IV) with RN made aware end of session    Exercises     Assessment/Plan    PT Assessment Patient needs continued PT services  PT Problem List Decreased strength;Decreased range of motion;Decreased activity tolerance;Decreased balance;Decreased mobility;Decreased coordination;Decreased cognition       PT Treatment Interventions DME instruction;Gait training;Functional mobility training;Therapeutic activities;Therapeutic exercise;Balance training;Neuromuscular re-education;Cognitive remediation;Patient/family education    PT Goals (Current goals can be found in the Care Plan section)  Acute Rehab PT Goals Patient Stated Goal: requesting water PT Goal Formulation: Patient unable to participate in goal  setting Time For Goal Achievement: 10/12/18 Potential to Achieve Goals: Good    Frequency Min 4X/week   Barriers to discharge        Co-evaluation PT/OT/SLP Co-Evaluation/Treatment: Yes Reason for Co-Treatment: To address functional/ADL transfers;Necessary to address cognition/behavior during functional activity;For patient/therapist safety PT goals addressed during session: Mobility/safety with mobility;Balance OT goals addressed during session: ADL's and self-care;Strengthening/ROM       AM-PAC PT "6 Clicks" Mobility  Outcome Measure Help needed turning from your back to your side while in a flat bed without using bedrails?: Total Help needed moving from lying on your back to sitting on the side of a flat bed without using bedrails?: Total Help needed moving to and from a bed to a chair (including a wheelchair)?: Total Help needed standing up from a chair using your arms (e.g., wheelchair or bedside chair)?: Total Help needed to walk in hospital room?: Total Help needed climbing 3-5 steps with a railing? : Total 6 Click Score: 6    End of Session   Activity Tolerance: Patient limited by fatigue Patient left: in bed;with call bell/phone within reach Nurse Communication: Mobility status PT Visit Diagnosis: Other abnormalities of gait and mobility (R26.89);Hemiplegia and hemiparesis Hemiplegia - Right/Left: Left Hemiplegia - dominant/non-dominant: Non-dominant Hemiplegia - caused by: Cerebral infarction    Time: 5015-8682 PT Time Calculation (min) (ACUTE ONLY): 26 min   Charges:   PT Evaluation $PT Eval Moderate Complexity: 1 Mod  Laurina Bustle, PT, DPT Acute Rehabilitation Services Pager [phone] Office [phone]   Vanetta Mulders 09/28/2018, 3:46 PM

## 2018-09-28 NOTE — Progress Notes (Signed)
NIR round note via telephone due to new regulations. Donnella Sham, Charity fundraiser.  Patient underwent an image-guided cerebral arteriogram with emergent mechanical thrombectomy of right MCA M1 segment achieving a TICI 2b revascularization 10/15/2018 by Dr. Corliss Skains.  RN reports patient has just been extubated.  Alert, awake, and oriented x3. Speech and comprehension intact. PERRL bilaterally, right side 4 mm and left side 3 mm. Bilateral eyes cross midline to right with some nystagmus, bilateral eyes cross midline to left but right eye does not cross midline completely when looking left. No facial asymmetry. Tongue midline. Can spontaneously move right side, can wiggle left toes, no spontaneous movements of LUE. Visual fields, pronator drift, fine motor and coordination, gait, romberg, and heel to toe not assessed. Distal pulses 2+ bilaterally. Right groin site soft without active bleeding or hematoma.  Plans per neurology, appreciate and agree with management. Please call NIR with questions/concerns.  Waylan Boga Joei Frangos, PA-C 09/28/2018, 10:28 AM

## 2018-09-28 NOTE — Progress Notes (Signed)
Belongings - wedding ring and black stone ring taken down to security.  Per Dr Roda Shutters:  Will reassess need for 3% tomorrow AM. Will determine whether to place PICC line at that time. He is ok to continue 3% in peripheral IV past the 24 hour mark.  BP parameters elevated to <180

## 2018-09-28 NOTE — Progress Notes (Signed)
Transported patient to MRI while patient was on the vent. Patient remained stable during the transport.   

## 2018-09-28 NOTE — Significant Event (Signed)
PCCM Interval Note  Post extubation I was able to do a good c-spine exam with pt's participation. No pain experienced with palpation, gentle rotation, then finally controlled flexion.   Based on this I cleared his c-spine clinically and removed the c-collar.   Levy Pupa, MD, PhD 09/28/2018, 11:10 AM [facility] [phone] or if no answer [phone]

## 2018-09-28 NOTE — Progress Notes (Signed)
Assisted tele visit to patient with wife and young daughter.  Ann Lions, RN

## 2018-09-28 NOTE — Progress Notes (Signed)
Initial Nutrition Assessment  DOCUMENTATION CODES:   Not applicable  INTERVENTION:   1 container of Glucerna 1.5 QID 30 ml Prostat TID  Provides: 1724 kcal, 123 grams protein, and 720 ml free water.   TF regimen and Cleviprex provides: 2588 kcal   NUTRITION DIAGNOSIS:   Inadequate oral intake related to inability to eat as evidenced by NPO status.  GOAL:   Patient will meet greater than or equal to 90% of their needs  MONITOR:   TF tolerance, Diet advancement  REASON FOR ASSESSMENT:   Consult Enteral/tube feeding initiation and management  ASSESSMENT:   Pt with PMH of Afib on xarelto, chronic systolic/dystolic HF, DM, HTN, MR, anemia, obesity admitted with R MCA s/p IR for revascularization.    4/10 Cortrak placed  Medications reviewed and include: 3% hypertonic saline Cleviprex @ 18 provides: 864 kcal  Labs reviewed: Lab Results  Component Value Date   HGBA1C 8.9 (H) 09/28/2018     NUTRITION - FOCUSED PHYSICAL EXAM:  Deferred   Diet Order:   Diet Order            Diet NPO time specified  Diet effective now              EDUCATION NEEDS:   Not appropriate for education at this time  Skin:  Skin Assessment: (R groin incision)  Last BM:  unknown  Height:   Ht Readings from Last 1 Encounters:  08/17/18 5\' 11"  (1.803 m)    Weight:   Wt Readings from Last 1 Encounters:  2018/10/03 105 kg    Ideal Body Weight:     BMI:  Body mass index is 32.29 kg/m.  Estimated Nutritional Needs:   Kcal:  1700-1900  Protein:  95-110 grams  Fluid:  >1.7 L/day  Kendell Bane RD, LDN, CNSC [phone] Pager [phone] After Hours Pager

## 2018-09-28 NOTE — Progress Notes (Signed)
RT note: Rt instructed patient with use of Incentive spirometer. Patient displayed understand but has poor effort at this time.

## 2018-09-28 NOTE — Progress Notes (Signed)
Inpatient Diabetes Program Recommendations  AACE/ADA: New Consensus Statement on Inpatient Glycemic Control (2015)  Target Ranges:  Prepandial:   less than 140 mg/dL      Peak postprandial:   less than 180 mg/dL (1-2 hours)      Critically ill patients:  140 - 180 mg/dL   Lab Results  Component Value Date   GLUCAP 245 (H) 09/28/2018   HGBA1C 8.9 (H) 09/28/2018    Review of Glycemic Control Results for AXIL, NARD (MRN [id number]) as of 09/28/2018 15:07  Ref. Range 09/28/2018 03:25 09/28/2018 08:27 09/28/2018 11:14  Glucose-Capillary Latest Ref Range: 70 - 99 mg/dL 038 (H) 882 (H) 800 (H)   Diabetes history: DM 2 Outpatient Diabetes medications: Tresiba 8 units daily, Metformin 1000 mg bid Current orders for Inpatient glycemic control:  Novolog moderate q 4 hours Inpatient Diabetes Program Recommendations:   May consider adding Lantus 8 units daily (this is home dose).   Thanks  Beryl Meager, RN, BC-ADM Inpatient Diabetes Coordinator Pager [phone] (8a-5p)

## 2018-09-28 NOTE — Progress Notes (Signed)
  Echocardiogram 2D Echocardiogram has been performed.  Celene Skeen 09/28/2018, 8:37 AM

## 2018-09-28 NOTE — Evaluation (Addendum)
Occupational Therapy Evaluation Patient Details Name: Johnathan Archer MRN: [id number] DOB: 06-Oct-1970 Today's Date: 09/28/2018    History of Present Illness 48 year old male with history of Afib, chronic systolic/dystolic HF, DMT2, HTN, MR, NICM, anemia, obesity, and OSA, found down by coworkers wedged between sink and bathroom stall at work.  Presented as code stroke with left sided paralysis and right gaze.  Hypertensive at 221/ 145.  Found on CT to have acute right MCA M1 occlusion. Pt Intubated for airway protection. Taken to IR with successful revascularization of right MCA M1 occlusion. Pt extubated 4/10.    Clinical Impression   This 48 y/o male presents with the above. Pt lethargic this session initially and requires increased time and noxious stimuli to arouse. Pt continues to maintain eyes closed off and on during session but does nod head yes/no to therapist questions and intermittently following one step commands given multimodal cues to do so. Pt was working PTA and reports he lives with spouse. He currently presents with cognitive impairments, L side weakness and decreased sitting balance. Pt requiring two person assist for safe completion of bed mobility and to sit EOB. Pt tolerates sitting EOB with mod-maxA and increased assist to maintain RUE in lap as pt tends to push strongly with RUE creating strong L lateral lean. Pt tolerating sitting up fairly well with VSS, though with increased secretions/coughing while upright and requiring assist for suction to address/clear secretions. He currently requires modA for UB ADL and max-totalA for LB ADL. Pt will benefit from continued acute OT services and feel he is an appropriate candidate for CIR level therapy services at time of discharge to maximize his safety and independence with ADL and mobility. Will follow.     Follow Up Recommendations  CIR;Supervision/Assistance - 24 hour    Equipment Recommendations  Other (comment)(TBA)     Recommendations for Other Services       Precautions / Restrictions Precautions Precautions: Fall Restrictions Weight Bearing Restrictions: No      Mobility Bed Mobility Overal bed mobility: Needs Assistance Bed Mobility: Supine to Sit;Sit to Supine     Supine to sit: Max assist;+2 for physical assistance;+2 for safety/equipment Sit to supine: Total assist;+2 for physical assistance;+2 for safety/equipment   General bed mobility comments: verbal cues for pt to use RUE/RLE to assist with reaching towards bedrail and moving LE over EOB, assist for fully achieving LEs over EOB and to elevate trunk, pt tends to push with RUE creating strong L lateral lean   Transfers                 General transfer comment: deferred this session    Balance Overall balance assessment: Needs assistance Sitting-balance support: Feet supported;Single extremity supported Sitting balance-Leahy Scale: Poor Sitting balance - Comments: mod-maxA for sitting balance; max multimodal cues to maintain R hand in his lap (requires physical assist to do so) as pt wants to push with his RUE creating strong L lateral lean Postural control: Left lateral lean                                 ADL either performed or assessed with clinical judgement   ADL Overall ADL's : Needs assistance/impaired Eating/Feeding: NPO   Grooming: Minimal assistance;Bed level   Upper Body Bathing: Moderate assistance;Sitting Upper Body Bathing Details (indicate cue type and reason): supported sitting Lower Body Bathing: Maximal assistance;+2 for physical assistance;+2 for  safety/equipment;Sitting/lateral leans   Upper Body Dressing : Moderate assistance;Sitting   Lower Body Dressing: Maximal assistance;Total assistance;+2 for physical assistance;+2 for safety/equipment;Sitting/lateral leans;Bed level Lower Body Dressing Details (indicate cue type and reason): totalA to don socks at bed level     Toileting-  Clothing Manipulation and Hygiene: Maximal assistance;+2 for physical assistance;+2 for safety/equipment;Bed level         General ADL Comments: pt able to sit EOB briefly with OT/PT; began coughing and with suctioning needs therefore returned to supine to address     Vision         Perception     Praxis      Pertinent Vitals/Pain Pain Assessment: Faces Faces Pain Scale: No hurt Pain Intervention(s): Monitored during session     Hand Dominance Right   Extremity/Trunk Assessment Upper Extremity Assessment Upper Extremity Assessment: LUE deficits/detail LUE Deficits / Details: no trace movement noted today, digits tending to maintain flexed at rest - will continue to monitor for splinting needs LUE Sensation: decreased light touch LUE Coordination: decreased fine motor;decreased gross motor   Lower Extremity Assessment Lower Extremity Assessment: Defer to PT evaluation       Communication Communication Communication: No difficulties   Cognition Arousal/Alertness: Lethargic Behavior During Therapy: Flat affect Overall Cognitive Status: Impaired/Different from baseline Area of Impairment: Attention;Following commands;Safety/judgement;Awareness                   Current Attention Level: Sustained   Following Commands: Follows one step commands with increased time;Follows one step commands inconsistently Safety/Judgement: Decreased awareness of safety;Decreased awareness of deficits Awareness: Intellectual   General Comments: pt tending to maintain eyes closed, will open with max cues but only maintains briefly; pt approx 75% of one step commands given mulitmodal cues but requires repetition to maintain adherence to instruction; pt does not verbalize during session but nodding head yes/no to therapist questions, appears to be appropriately   General Comments  VSS throughout, pt with coughing spell sitting up requiring suction to clear secretions; pt also with  swelling noted to RUE (suspect due to IV) with RN made aware end of session    Exercises     Shoulder Instructions      Home Living Family/patient expects to be discharged to:: Private residence Living Arrangements: Spouse/significant other Available Help at Discharge: Family Type of Home: House Home Access: Stairs to enter     Home Layout: Two level                          Prior Functioning/Environment Level of Independence: Independent        Comments: pt nodding head yes when asked if he was working PTA        OT Problem List: Decreased strength;Decreased range of motion;Decreased activity tolerance;Impaired balance (sitting and/or standing);Decreased coordination;Decreased cognition;Decreased safety awareness;Impaired UE functional use;Impaired tone;Impaired sensation      OT Treatment/Interventions: Self-care/ADL training;Therapeutic exercise;DME and/or AE instruction;Energy conservation;Neuromuscular education;Therapeutic activities;Patient/family education;Balance training;Visual/perceptual remediation/compensation;Cognitive remediation/compensation    OT Goals(Current goals can be found in the care plan section) Acute Rehab OT Goals Patient Stated Goal: requesting water OT Goal Formulation: With patient Time For Goal Achievement: 10/12/18 Potential to Achieve Goals: Good  OT Frequency: Min 3X/week   Barriers to D/C:            Co-evaluation PT/OT/SLP Co-Evaluation/Treatment: Yes Reason for Co-Treatment: For patient/therapist safety;Complexity of the patient's impairments (multi-system involvement);Necessary to address cognition/behavior during functional activity;To address functional/ADL  transfers   OT goals addressed during session: ADL's and self-care;Strengthening/ROM      AM-PAC OT "6 Clicks" Daily Activity     Outcome Measure Help from another person eating meals?: Total(NPO) Help from another person taking care of personal grooming?: A  Lot Help from another person toileting, which includes using toliet, bedpan, or urinal?: Total Help from another person bathing (including washing, rinsing, drying)?: A Lot Help from another person to put on and taking off regular upper body clothing?: A Lot Help from another person to put on and taking off regular lower body clothing?: Total 6 Click Score: 9   End of Session Equipment Utilized During Treatment: Oxygen Nurse Communication: Mobility status  Activity Tolerance: Patient tolerated treatment well Patient left: in bed;with call bell/phone within reach;with bed alarm set  OT Visit Diagnosis: Hemiplegia and hemiparesis;Muscle weakness (generalized) (M62.81) Hemiplegia - Right/Left: Left Hemiplegia - dominant/non-dominant: Non-Dominant Hemiplegia - caused by: Cerebral infarction                Time: 1610-9604: 1113-1146 OT Time Calculation (min): 33 min Charges:  OT General Charges $OT Visit: 1 Visit OT Evaluation $OT Eval Moderate Complexity: 1 Mod  Marcy SirenBreanna Donda Friedli, OT [facility] Pager [phone] Office [phone]   Orlando PennerBreanna L Kemontae Dunklee 09/28/2018, 1:52 PM

## 2018-09-28 NOTE — Progress Notes (Signed)
Rehab Admissions Coordinator Note:  Patient was screened by Clois Dupes for appropriateness for an Inpatient Acute Rehab Consult per OT recommendation. Pt just  extubated today. I would like to see how pt progresses over the weekend before proceeding with an inpt rehab consult.I will follow up on Monday.   Clois Dupes RN MSN 09/28/2018, 2:03 PM  I can be reached at [phone].

## 2018-09-28 NOTE — Procedures (Signed)
Extubation Procedure Note  Patient Details:   Name: [name] DOB: [birth year] MRN: [id number]   Airway Documentation:  Airway 7.5 mm (Active)  Secured at (cm) 25 cm 09/28/2018  8:36 AM  Measured From Lips 09/28/2018  8:36 AM  Secured Location Center 09/28/2018  8:36 AM  Secured By [employer] 09/28/2018  8:36 AM  Tube Holder Repositioned Yes 09/28/2018  8:36 AM  Cuff Pressure (cm H2O) 26 cm H2O 09/28/2018  8:36 AM  Site Condition Dry 09/28/2018  8:36 AM   Vent end date: (not recorded) Vent end time: (not recorded)   Evaluation  O2 sats: stable throughout Complications: No apparent complications Patient did tolerate procedure well. Bilateral Breath Sounds: Clear   Yes  Extubated patient to 4L nasal canula. Patient oriented to time and place.  Morley Kos 09/28/2018, 10:23 AM

## 2018-09-29 ENCOUNTER — Inpatient Hospital Stay (HOSPITAL_COMMUNITY): Payer: BLUE CROSS/BLUE SHIELD

## 2018-09-29 DIAGNOSIS — I509 Heart failure, unspecified: Secondary | ICD-10-CM

## 2018-09-29 DIAGNOSIS — G936 Cerebral edema: Secondary | ICD-10-CM

## 2018-09-29 DIAGNOSIS — I480 Paroxysmal atrial fibrillation: Secondary | ICD-10-CM

## 2018-09-29 LAB — BASIC METABOLIC PANEL
Anion gap: 10 (ref 5–15)
BUN: 25 mg/dL — ABNORMAL HIGH (ref 6–20)
CO2: 22 mmol/L (ref 22–32)
Calcium: 8.3 mg/dL — ABNORMAL LOW (ref 8.9–10.3)
Chloride: 122 mmol/L — ABNORMAL HIGH (ref 98–111)
Creatinine, Ser: 1.53 mg/dL — ABNORMAL HIGH (ref 0.61–1.24)
GFR calc Af Amer: >60 mL/min (ref >60)
GFR calc non Af Amer: 53 mL/min — ABNORMAL LOW (ref >60)
Glucose, Bld: 186 mg/dL — ABNORMAL HIGH (ref 70–99)
Potassium: 3.9 mmol/L (ref 3.5–5.1)
Sodium: 154 mmol/L — ABNORMAL HIGH (ref 135–145)

## 2018-09-29 LAB — GLUCOSE, CAPILLARY
Glucose-Capillary: 154 mg/dL — ABNORMAL HIGH (ref 70–99)
Glucose-Capillary: 172 mg/dL — ABNORMAL HIGH (ref 70–99)
Glucose-Capillary: 219 mg/dL — ABNORMAL HIGH (ref 70–99)
Glucose-Capillary: 224 mg/dL — ABNORMAL HIGH (ref 70–99)
Glucose-Capillary: 239 mg/dL — ABNORMAL HIGH (ref 70–99)
Glucose-Capillary: 246 mg/dL — ABNORMAL HIGH (ref 70–99)

## 2018-09-29 LAB — CBC
HCT: 36.8 % — ABNORMAL LOW (ref 39.0–52.0)
Hemoglobin: 11.2 g/dL — ABNORMAL LOW (ref 13.0–17.0)
MCH: 30.9 pg (ref 26.0–34.0)
MCHC: 30.4 g/dL (ref 30.0–36.0)
MCV: 101.7 fL — ABNORMAL HIGH (ref 80.0–100.0)
Platelets: 176 10*3/uL (ref 150–400)
RBC: 3.62 MIL/uL — ABNORMAL LOW (ref 4.22–5.81)
RDW: 14 % (ref 11.5–15.5)
WBC: 7.3 10*3/uL (ref 4.0–10.5)
nRBC: 0 % (ref 0.0–0.2)

## 2018-09-29 LAB — SODIUM
Sodium: 153 mmol/L — ABNORMAL HIGH (ref 135–145)
Sodium: 157 mmol/L — ABNORMAL HIGH (ref 135–145)

## 2018-09-29 MED ORDER — HYDRALAZINE HCL 50 MG PO TABS
50.0000 mg | ORAL_TABLET | Freq: Two times a day (BID) | ORAL | Status: DC
  Administered 2018-09-29 – 2018-10-04 (×9): 50 mg
  Filled 2018-09-29 (×9): qty 1

## 2018-09-29 MED ORDER — ISOSORBIDE MONONITRATE ER 30 MG PO TB24: 30.0000 mg | ORAL_TABLET | Freq: Every day | ORAL | Status: DC

## 2018-09-29 MED ORDER — INSULIN GLARGINE 100 UNIT/ML ~~LOC~~ SOLN
10.0000 [IU] | Freq: Every day | SUBCUTANEOUS | Status: DC
  Administered 2018-09-29: 23:00:00 10 [IU] via SUBCUTANEOUS
  Filled 2018-09-29 (×2): qty 0.1

## 2018-09-29 MED ORDER — CARVEDILOL 12.5 MG PO TABS
25.0000 mg | ORAL_TABLET | Freq: Two times a day (BID) | ORAL | Status: DC
  Administered 2018-09-29 – 2018-10-03 (×8): 25 mg
  Filled 2018-09-29 (×9): qty 2

## 2018-09-29 MED ORDER — ATORVASTATIN CALCIUM 10 MG PO TABS
20.0000 mg | ORAL_TABLET | Freq: Every day | ORAL | Status: DC
  Administered 2018-09-29: 19:00:00 20 mg via ORAL
  Filled 2018-09-29: qty 2

## 2018-09-29 MED ORDER — FUROSEMIDE 40 MG PO TABS
60.0000 mg | ORAL_TABLET | Freq: Two times a day (BID) | ORAL | Status: DC
  Administered 2018-09-29: 60 mg via ORAL
  Filled 2018-09-29: qty 1

## 2018-09-29 MED ORDER — FUROSEMIDE 40 MG PO TABS
60.0000 mg | ORAL_TABLET | Freq: Two times a day (BID) | ORAL | Status: DC
  Administered 2018-09-30: 60 mg
  Filled 2018-09-29: qty 1

## 2018-09-29 MED ORDER — SACUBITRIL-VALSARTAN 49-51 MG PO TABS
1.0000 | ORAL_TABLET | Freq: Two times a day (BID) | ORAL | Status: DC
  Administered 2018-09-29 – 2018-10-03 (×7): 1 via ORAL
  Filled 2018-09-29 (×8): qty 1

## 2018-09-29 MED ORDER — SPIRONOLACTONE 25 MG PO TABS
25.0000 mg | ORAL_TABLET | Freq: Every day | ORAL | Status: DC
  Administered 2018-09-30 – 2018-10-03 (×4): 25 mg
  Filled 2018-09-29 (×4): qty 1

## 2018-09-29 MED ORDER — HYDRALAZINE HCL 50 MG PO TABS: 50.0000 mg | ORAL_TABLET | Freq: Two times a day (BID) | ORAL | Status: DC

## 2018-09-29 MED ORDER — SPIRONOLACTONE 25 MG PO TABS
25.0000 mg | ORAL_TABLET | Freq: Every day | ORAL | Status: DC
  Filled 2018-09-29: qty 1

## 2018-09-29 MED ORDER — AMIODARONE HCL 200 MG PO TABS
200.0000 mg | ORAL_TABLET | Freq: Every day | ORAL | Status: DC
  Administered 2018-09-29 – 2018-10-10 (×12): 200 mg
  Filled 2018-09-29 (×13): qty 1

## 2018-09-29 MED ORDER — ATORVASTATIN CALCIUM 10 MG PO TABS
20.0000 mg | ORAL_TABLET | Freq: Every day | ORAL | Status: DC
  Administered 2018-09-30 – 2018-10-10 (×11): 20 mg
  Filled 2018-09-29 (×11): qty 2

## 2018-09-29 MED ORDER — SODIUM CHLORIDE 3 % IV SOLN
INTRAVENOUS | Status: DC
  Administered 2018-09-29 (×2): 75 mL/h via INTRAVENOUS
  Filled 2018-09-29 (×5): qty 500

## 2018-09-29 NOTE — Plan of Care (Signed)
Elink:  Video chat with family 

## 2018-09-29 NOTE — Progress Notes (Signed)
NAME:  Johnathan Archer, MRN:  [id number], DOB:  15-Jul-1970, LOS: 2 ADMISSION DATE:  10-02-2018, CONSULTATION DATE:  2018/10/02 REFERRING MD:  Dr. Loni Beckwith, CHIEF COMPLAINT:  CVA  Brief History   47 yoM, LSW 1000 am, presenting with left sided paralysis and right gaze found to have acute right MCA M1 occlusion.  Difficult intubation for airway protection.  Taken for EVR with successful revascularization.    History of present illness   HPI obtained from medical chart review as patient is intubated/ sedated   48 year old male with history of Afib on xarelto, chronic systolic / dystolic HF, DMT2, HTN, MR, NICM, anemia, obesity, and OSA, last seen well around 1000 by coworkers found wedged between sink and bathroom stall at work.  Presented as code stroke with left sided paralysis and right gaze.  Hypertensive at 221/ 145.  Found on CT to have acute right MCA M1 occlusion.  Intubated for airway protection, was a difficult intubation.  Taken to IR with successful revascularization of right MCA M1 occlusion achieving TICI 2b revascularization.  Returns to ICU, PCCM to consult for ventilator management.   Past Medical History  Afib on xarelto, chronic systolic / dystolic HF, DMT2, HTN, MR, NICM, anemia, obesity, OSA, ED   Significant Hospital Events   4/9 Admitted / EVR  Consults:   Procedures:  4/9 ETT >> 4/9 R radial aline >> 4/9 cerebral angiogram   Significant Diagnostic Tests:  4/9 CTH >> 1. Hyperdense right MCA compatible with acute embolus. Early infarct in the right basal ganglia and insula 2. 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 Poor flow in right MCA branches. Small amount of clot in right A1 segment. Findings compatible with acute embolus. No significant atherosclerotic disease in the neck or head.  4/9 CTH post intervention >>  Hyperdensity within the right basal ganglia may indicate contrast staining or petechial hemorrhage. No significant mass effect.  4/10 MRI brain >> evolving right MCA M1 stroke, culprit vessel is patent.  Some petechial changes without overt hemorrhagic conversion, no shift  Echocardiogram 4/10 >> normal LV function 60-65%, mildly increased LV wall thickness without diastolic dysfunction, normal RV function, no evidence of shunt by bubble study,  Micro Data:   Antimicrobials:  4/9 ancef preop  Interim history/subjective:  Tolerated extubation 4/10 Remains on Cleviprex Failed swallowing evaluation 4/10, left n.p.o. with gastric tube in place  Objective   Blood pressure (!) 166/96, pulse 87, temperature 98.9 F (37.2 C), temperature source Axillary, resp. rate (!) 21, weight 97.6 kg, SpO2 99 %.        Intake/Output Summary (Last 24 hours) at 09/29/2018 0843 Last data filed at 09/29/2018 0700 Gross per 24 hour  Intake 2581.89 ml  Output 1380 ml  Net 1201.89 ml   Filed Weights   02-Oct-2018 1320 09/29/18 0500  Weight: 105 kg 97.6 kg    Examination: General: Ill-appearing gentleman, comfortable lying in bed HEENT: Oropharynx clear Neuro: Wakes easily to voice, able to phonate, follows commands on the right, flaccid on the left.  Somewhat weak cough CV: Regular, 2/6 systolic murmur PULM: Overall clear with some referred upper airway noise from secretions GI: Soft, nondistended with positive bowel sounds Extremities: No significant edema Skin: No rash  Resolved Hospital Problem list    Assessment & Plan:  Right MCA M1 occlusion s/p EVR  with successful revascularization P:  Plans per stroke team and interventional radiology Remains on hypertonic saline, duration per neurology plans Systolic blood pressure goal 120-140, remains on Cleviprex 10.  Consider restart enteral blood pressure control as below  Acute respiratory insufficiency  Difficult airway OSA P:  Extubated  successfully, continue pulmonary hygiene Would like to defer restarting nocturnal CPAP while I am concerned about his airway protection, restart when we feel this is safe  At risk dysphasia Tube feeding running, NG tube in place Continue follow-up evaluations with SLP  Acute renal failure, consider hypertensive, contrast media P:  Follow BMP, urine output  Afib on xarelto P:  Xarelto on hold, defer to neurology as to when this can be restarted in light of the petechial changes on his MRI brain 4/10 Restart amiodarone per tube on 4/11  Chronic systolic/ diastolic HF - 2/20 EF 55%, normal RV, mod MR, mild dilation of aortic root.  Echocardiogram 4/11 with improved LV function and no evidence of impaired LV relaxation as above P:  Restart home carvedilol at lower dose 4/11 Continue to hold hydralazine, furosemide, Imdur, Aldactone, Entresto but consider restart over the next few days Wean Cleviprex to off as able  Hypertension, UDS negative P:  SBP goals as above Adding back lower dose carvedilol 4/11, others when able  DMT2 P:  Sliding-scale insulin per protocol Home metformin and Tresiba insulin are on hold  Hx normocytic anemia P:  Follow CBC  Best practice:  Diet: NPO, SLP follow-up Pain/Anxiety/Delirium protocol (if indicated): Off VAP protocol (if indicated): Completed DVT prophylaxis: SCDs GI prophylaxis: PPI Glucose control: SSI Mobility: BR Code Status: Full Family Communication: Discussed with patient 4/10 and 4/11 Disposition: ICU  Labs   CBC: Recent Labs  Lab 10-22-2018 1248 2018/10/22 1716 10-22-2018 1919 09/28/18 0224 09/28/18 0316 09/29/18 0320  WBC 7.3  --   --  9.7  --  7.3  NEUTROABS 5.8  --   --  8.1*  --   --   HGB 12.5* 10.5* 11.2* 11.9* 11.9* 11.2*  HCT 38.7* 31.0* 33.0* 37.0* 35.0* 36.8*  MCV 97.7  --   --  97.1  --  101.7*  PLT 241  --   --  187  --  176    Basic Metabolic Panel: Recent Labs  Lab Oct 22, 2018 1248 Oct 22, 2018 1252  10/22/18 1716 22-Oct-2018 1919 09/28/18 0224 09/28/18 0316 09/28/18 0655 09/28/18 1545 09/28/18 2200 09/29/18 0320  NA 134*  --  141 144 139 142 142 146* 148* 154*  K 4.6  --  3.4* 3.7 3.9 3.9  --   --   --  3.9  CL 100  --   --   --  107  --   --   --   --  122*  CO2 21*  --   --   --  20*  --   --   --   --  22  GLUCOSE 434*  --   --   --  186*  --   --   --   --  186*  BUN 25*  --   --   --  25*  --   --   --   --  25*  CREATININE 1.42* 1.30*  --   --  1.51*  --   --   --   --  1.53*  CALCIUM 9.2  --   --   --  8.4*  --   --   --   --  8.3*  MG  --   --   --   --  1.8  --   --   --   --   --   PHOS  --   --   --   --  3.3  --   --   --   --   --    GFR: Estimated Creatinine Clearance: 70.3 mL/min (A) (by C-G formula based on SCr of 1.53 mg/dL (H)). Recent Labs  Lab 10/02/2018 1248 09/28/18 0224 09/29/18 0320  WBC 7.3 9.7 7.3    Liver Function Tests: Recent Labs  Lab 09/26/2018 1248  AST 19  ALT 22  ALKPHOS 50  BILITOT 1.7*  PROT 7.3  ALBUMIN 3.3*   No results for input(s): LIPASE, AMYLASE in the last 168 hours. No results for input(s): AMMONIA in the last 168 hours.  ABG    Component Value Date/Time   PHART 7.410 09/28/2018 0316   PCO2ART 33.4 09/28/2018 0316   PO2ART 112.0 (H) 09/28/2018 0316   HCO3 21.3 09/28/2018 0316   TCO2 22 09/28/2018 0316   ACIDBASEDEF 3.0 (H) 09/28/2018 0316   O2SAT 99.0 09/28/2018 0316     Coagulation Profile: Recent Labs  Lab 10/09/2018 1248  INR 2.6*    Cardiac Enzymes: No results for input(s): CKTOTAL, CKMB, CKMBINDEX, TROPONINI in the last 168 hours.  HbA1C: Hgb A1c MFr Bld  Date/Time Value Ref Range Status  09/28/2018 02:24 AM 8.9 (H) 4.8 - 5.6 % Final    Comment:    (NOTE) Pre diabetes:          5.7%-6.4% Diabetes:              >6.4% Glycemic control for   <7.0% adults with diabetes   03/11/2017 07:41 PM 8.0 (H) 4.8 - 5.6 % Final    Comment:    (NOTE) Pre diabetes:          5.7%-6.4% Diabetes:               >6.4% Glycemic control for   <7.0% adults with diabetes     CBG: Recent Labs  Lab 09/28/18 1556 09/28/18 1927 09/28/18 2326 09/29/18 0309 09/29/18 0745  GLUCAP 177* 161* 182* 172* 154*     Levy Pupa, MD, PhD 09/29/2018, 8:54 AM [facility] [phone] or if no answer [phone]

## 2018-09-29 NOTE — Progress Notes (Signed)
Physical Therapy Treatment Patient Details Name: Johnathan Archer MRN: [id number] DOB: [birth year] Today's Date: 09/29/2018    History of Present Illness 48 year old male with history of Afib, chronic systolic/dystolic HF, DMT2, HTN, MR, NICM, anemia, obesity, and OSA, found down by coworkers wedged between sink and bathroom stall at work.  Presented as code stroke with left sided paralysis and right gaze.  Hypertensive at 221/ 145.  Found on CT to have acute right MCA M1 occlusion. Pt Intubated for airway protection. Taken to IR with successful revascularization of right MCA M1 occlusion. Pt extubated 4/10.     PT Comments    Pt was participatory throughout, but eyes closed most of the session.  Pt showed decreased attention to the left and was unable to track to the left past midline, but could turn attention across to the left with max cues.  Emphasis on transitions, sitting balance/truncal activation and transfer to the chair.    Follow Up Recommendations  CIR;Supervision/Assistance - 24 hour     Equipment Recommendations  Other (comment)(TBA)    Recommendations for Other Services       Precautions / Restrictions Precautions Precautions: Fall    Mobility  Bed Mobility Overal bed mobility: Needs Assistance Bed Mobility: Supine to Sit     Supine to sit: Max assist;+2 for physical assistance;+2 for safety/equipment     General bed mobility comments: LE's assisted off bed, cues for direction and truncal assist up via R elbow.    Transfers Overall transfer level: Needs assistance   Transfers: Squat Pivot Transfers     Squat pivot transfers: Max assist;+2 physical assistance     General transfer comment: cues for direction, face to face assist with pt helping with right u and LE's  Ambulation/Gait             General Gait Details: unable   Stairs             Wheelchair Mobility    Modified Rankin (Stroke Patients Only) Modified Rankin (Stroke  Patients Only) Pre-Morbid Rankin Score: No symptoms Modified Rankin: Severe disability     Balance Overall balance assessment: Needs assistance Sitting-balance support: Feet supported;Single extremity supported Sitting balance-Leahy Scale: Poor Sitting balance - Comments: sat EOB for approx 8 min working on activating trunk.  Overall needing max assist of 1-2 persons.  More activation of extensors than abs or obliques                                    Cognition Arousal/Alertness: Lethargic Behavior During Therapy: Flat affect Overall Cognitive Status: (NT formally)                     Current Attention Level: Sustained   Following Commands: Follows one step commands with increased time       General Comments: eyes closed 90% of time, but pt will open to command.      Exercises Other Exercises Other Exercises: ROM to bil LE.  PROM to L LE,  A/resistance to R LE    General Comments General comments (skin integrity, edema, etc.): vss overall      Pertinent Vitals/Pain Pain Assessment: Faces Faces Pain Scale: Hurts a little bit Pain Location: iv site Pain Descriptors / Indicators: Grimacing Pain Intervention(s): Monitored during session    Home Living  Prior Function            PT Goals (current goals can now be found in the care plan section) Acute Rehab PT Goals PT Goal Formulation: Patient unable to participate in goal setting Time For Goal Achievement: 10/12/18 Potential to Achieve Goals: Good Progress towards PT goals: Progressing toward goals    Frequency    Min 4X/week      PT Plan Current plan remains appropriate    Co-evaluation              AM-PAC PT "6 Clicks" Mobility   Outcome Measure  Help needed turning from your back to your side while in a flat bed without using bedrails?: Total Help needed moving from lying on your back to sitting on the side of a flat bed without using  bedrails?: Total Help needed moving to and from a bed to a chair (including a wheelchair)?: Total Help needed standing up from a chair using your arms (e.g., wheelchair or bedside chair)?: Total Help needed to walk in hospital room?: Total Help needed climbing 3-5 steps with a railing? : Total 6 Click Score: 6    End of Session   Activity Tolerance: Patient tolerated treatment well;Patient limited by fatigue Patient left: in chair;with call bell/phone within reach;with chair alarm set Nurse Communication: Mobility status PT Visit Diagnosis: Other abnormalities of gait and mobility (R26.89);Hemiplegia and hemiparesis Hemiplegia - Right/Left: Left Hemiplegia - dominant/non-dominant: Non-dominant Hemiplegia - caused by: Cerebral infarction     Time: 4332-9518 PT Time Calculation (min) (ACUTE ONLY): 25 min  Charges:  $Therapeutic Activity: 8-22 mins $Neuromuscular Re-education: 8-22 mins                     09/29/2018  Garwood Bing, PT Acute Rehabilitation Services [phone]  (pager) [phone]  (office)   Johnathan Archer 09/29/2018, 4:24 PM

## 2018-09-29 NOTE — Progress Notes (Signed)
  Speech Language Pathology Treatment: Dysphagia  Patient Details Name: Johnathan Archer MRN: [id number] DOB: 03-14-1971 Today's Date: 09/29/2018 Time: 4008-6761 SLP Time Calculation (min) (ACUTE ONLY): 12 min  Assessment / Plan / Recommendation Clinical Impression  Pt seen for skilled ST treatment targeting dysphagia. Pt sitting upright in chair, lethargic and requires max cues (sternal rub, consistent tactile and verbal stimulation) to maintain alertness, attention initially. Oral care completed, with pt requiring tactile assist for sufficient jaw opening; weak lingual protrusion noted. Max cues initially to manipulate ice chips and initiate pharyngeal swallow; as trials progressed and with SLP cuing, pt maintained eyes open and began leaning toward spoon to retrieve small ice chips. Timing, automaticity improved with better attention/arousal, but pt continues to exhibit signs of pharyngeal dysphagia, with delayed, wet coughing after ice chips. Cough, vocal quality remain weak. Continue NPO with cortrak; SLP to continue to follow and provide opportunities for PO trials. Hopeful that with improvements in sustained attention, arousal, and cough strength, pt will be able to participate in instrumental testing of swallow function (MBS).     HPI HPI: 87 yoM, LSW 1000 am, presenting with left sided paralysis and right gaze found to have acute right MCA M1 occlusion.  Difficult intubation for airway protection.  Taken for EVR with successful revascularization.  Intubated from 4/9 to 4/10. MRI shows Large acute right MCA territory infarct: Restricted diffusion seen involving the right frontal, temporal, and parietal lobes, compatible with acute right MCA territory infarct. Involvement of the right basal ganglia and internal capsule, with patchy involvement of the right centrum semi ovale. Associated regional mass effect with mild attenuation of the right lateral ventricle. Associated edema and mild  regional mass effect without significant midline shift. Scattered small volume petechial hemorrhage without associated hemorrhagic transformation.      SLP Plan  Continue with current plan of care       Recommendations  Diet recommendations: NPO Medication Administration: Via alternative means                Plan: Continue with current plan of care       GO              Rondel Baton, MS, CCC-SLP Speech-Language Pathologist Acute Rehabilitation Services Pager: [phone] Office: [phone]   Arlana Lindau 09/29/2018, 3:15 PM

## 2018-09-29 NOTE — Anesthesia Postprocedure Evaluation (Signed)
Anesthesia Post Note  Patient: Johnathan Archer  Procedure(s) Performed: IR WITH ANESTHESIA ([name] )     Patient location during evaluation: SICU Anesthesia Type: General Level of consciousness: sedated Pain management: pain level controlled Vital Signs Assessment: post-procedure vital signs reviewed and stable Respiratory status: patient remains intubated per anesthesia plan Cardiovascular status: stable Postop Assessment: no apparent nausea or vomiting Anesthetic complications: no    Last Vitals:  Vitals:   09/29/18 1953 09/29/18 2000  BP: (!) 174/90 (!) 178/98  Pulse: 94 95  Resp: (!) 31 (!) 32  Temp:    SpO2: 97% 97%    Last Pain:  Vitals:   09/29/18 1845  TempSrc: Oral  PainSc:                  Tonianne Fine S

## 2018-09-29 NOTE — Progress Notes (Signed)
STROKE TEAM PROGRESS NOTE   INTERVAL HISTORY Pt RN at bedside.  Patient was extubated yesterday, this morning he seems more lethargic than yesterday.  Not open eyes bilaterally, however able to follow simple commands on the right arm and leg.  No facial expression.  Pupil equal now bilaterally, however limited extraocular movement.  CT this morning showed stable infarct but increased regional mass-effect midline shift 4 mm.  Sodium 153, continue 3% saline  Vitals:   09/29/18 0800 09/29/18 0830 09/29/18 0900 09/29/18 0930  BP: (!) 154/126     Pulse: 95 96 95 96  Resp: 20 (!) 30 (!) 25 (!) 24  Temp: 98.9 F (37.2 C)     TempSrc: Axillary     SpO2: 99% 97% 96% 97%  Weight:        CBC:  Recent Labs  Lab Jul 17, 2018 1248  09/28/18 0224 09/28/18 0316 09/29/18 0320  WBC 7.3  --  9.7  --  7.3  NEUTROABS 5.8  --  8.1*  --   --   HGB 12.5*   < > 11.9* 11.9* 11.2*  HCT 38.7*   < > 37.0* 35.0* 36.8*  MCV 97.7  --  97.1  --  101.7*  PLT 241  --  187  --  176   < > = values in this interval not displayed.    Basic Metabolic Panel:  Recent Labs  Lab 09/28/18 0224 09/28/18 0316  09/29/18 0320 09/29/18 1000  NA 139 142   < > 154* 153*  K 3.9 3.9  --  3.9  --   CL 107  --   --  122*  --   CO2 20*  --   --  22  --   GLUCOSE 186*  --   --  186*  --   BUN 25*  --   --  25*  --   CREATININE 1.51*  --   --  1.53*  --   CALCIUM 8.4*  --   --  8.3*  --   MG 1.8  --   --   --   --   PHOS 3.3  --   --   --   --    < > = values in this interval not displayed.   Lipid Panel:     Component Value Date/Time   CHOL 134 09/28/2018 0224   TRIG 40 09/28/2018 0224   HDL 53 09/28/2018 0224   CHOLHDL 2.5 09/28/2018 0224   VLDL 8 09/28/2018 0224   LDLCALC 73 09/28/2018 0224   HgbA1c:  Lab Results  Component Value Date   HGBA1C 8.9 (H) 09/28/2018   Urine Drug Screen:     Component Value Date/Time   LABOPIA NONE DETECTED 09/28/2018 0230   COCAINSCRNUR NONE DETECTED 09/28/2018 0230    COCAINSCRNUR NEGATIVE 11/10/2011 0739   LABBENZ NONE DETECTED 09/28/2018 0230   LABBENZ NEGATIVE 11/10/2011 0739   AMPHETMU NONE DETECTED 09/28/2018 0230   THCU NONE DETECTED 09/28/2018 0230   LABBARB NONE DETECTED 09/28/2018 0230    Alcohol Level     Component Value Date/Time   ETH <10 Jan 28, 202020 1248    IMAGING Ct Angio Head W Or Wo Contrast  Result Date: 09/20/2018 CLINICAL DATA:  Stroke.  Left-sided weakness EXAM: CT ANGIOGRAPHY HEAD AND NECK TECHNIQUE: Multidetector CT imaging of the head and neck was performed using the standard protocol during bolus administration of intravenous contrast. Multiplanar CT image reconstructions and MIPs were obtained to evaluate the  vascular anatomy. Carotid stenosis measurements (when applicable) are obtained utilizing NASCET criteria, using the distal internal carotid diameter as the denominator. CONTRAST:  75mL OMNIPAQUE IOHEXOL 350 MG/ML SOLN COMPARISON:  CT head 10/12/2018 FINDINGS: CTA NECK FINDINGS Aortic arch: Standard branching. Imaged portion shows no evidence of aneurysm or dissection. No significant stenosis of the major arch vessel origins. Right carotid system: Right carotid system widely patent. No stenosis or atherosclerotic disease Left carotid system: Left carotid system widely patent. No stenosis or atherosclerotic disease Vertebral arteries: Both vertebral arteries are widely patent without significant stenosis or atherosclerotic disease. Skeleton: No acute skeletal abnormality. Other neck: Negative for mass or soft tissue swelling in the neck. Upper chest: Lung apices clear bilaterally. Review of the MIP images confirms the above findings CTA HEAD FINDINGS Anterior circulation: Right cavernous carotid patent. There is occlusion of the supraclinoid internal carotid artery just proximal to the bifurcation. Clot extends into the right middle cerebral artery M1 which is occluded. There is little flow in right MCA branches. Small amount of clot in  the proximal right A1 segment which shows good flow possibly from the left side. Both anterior cerebral arteries are patent Left cavernous carotid widely patent. Left anterior and middle cerebral arteries widely patent without stenosis. Posterior circulation: No significant stenosis, proximal occlusion, aneurysm, or vascular malformation. 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 Poor flow in right MCA branches. Small amount of clot in right A1 segment. Findings compatible with acute embolus. No significant atherosclerotic disease in the neck or head. These results were called by telephone at the time of interpretation on 10/17/2018 at 1:19 pm to Dr. Arther Dames , who verbally acknowledged these results. Electronically Signed   By: Marlan Palau M.D.   On: 10/03/2018 13:21   Ct Head Wo Contrast  Result Date: 10/13/2018 CLINICAL DATA:  Stroke follow-up EXAM: CT HEAD WITHOUT CONTRAST TECHNIQUE: Contiguous axial images were obtained from the base of the skull through the vertex without intravenous contrast. COMPARISON:  CTA head neck 10/14/2018 FINDINGS: Brain: There is hyperdensity within the right basal ganglia. No midline shift. Mild mass effect on the right lateral ventricle. Left hemisphere is normal. Vascular: Intravascular enhancement secondary to recent contrast administration. Skull: The visualized skull base, calvarium and extracranial soft tissues are normal. Sinuses/Orbits: No fluid levels or advanced mucosal thickening of the visualized paranasal sinuses. No mastoid or middle ear effusion. The orbits are normal. IMPRESSION: Hyperdensity within the right basal ganglia may indicate contrast staining or petechial hemorrhage. No significant mass effect. Electronically Signed    By: Deatra Robinson M.D.   On: 09/19/2018 16:33   Ct Angio Neck W Or Wo Contrast  Result Date: 10/03/2018 CLINICAL DATA:  Stroke.  Left-sided weakness EXAM: CT ANGIOGRAPHY HEAD AND NECK TECHNIQUE: Multidetector CT imaging of the head and neck was performed using the standard protocol during bolus administration of intravenous contrast. Multiplanar CT image reconstructions and MIPs were obtained to evaluate the vascular anatomy. Carotid stenosis measurements (when applicable) are obtained utilizing NASCET criteria, using the distal internal carotid diameter as the denominator. CONTRAST:  75mL OMNIPAQUE IOHEXOL 350 MG/ML SOLN COMPARISON:  CT head 10/13/2018 FINDINGS: CTA NECK FINDINGS Aortic arch: Standard branching. Imaged portion shows no evidence of aneurysm or dissection. No significant stenosis of the major arch vessel  origins. Right carotid system: Right carotid system widely patent. No stenosis or atherosclerotic disease Left carotid system: Left carotid system widely patent. No stenosis or atherosclerotic disease Vertebral arteries: Both vertebral arteries are widely patent without significant stenosis or atherosclerotic disease. Skeleton: No acute skeletal abnormality. Other neck: Negative for mass or soft tissue swelling in the neck. Upper chest: Lung apices clear bilaterally. Review of the MIP images confirms the above findings CTA HEAD FINDINGS Anterior circulation: Right cavernous carotid patent. There is occlusion of the supraclinoid internal carotid artery just proximal to the bifurcation. Clot extends into the right middle cerebral artery M1 which is occluded. There is little flow in right MCA branches. Small amount of clot in the proximal right A1 segment which shows good flow possibly from the left side. Both anterior cerebral arteries are patent Left cavernous carotid widely patent. Left anterior and middle cerebral arteries widely patent without stenosis. Posterior circulation: No significant  stenosis, proximal occlusion, aneurysm, or vascular malformation. 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 Poor flow in right MCA branches. Small amount of clot in right A1 segment. Findings compatible with acute embolus. No significant atherosclerotic disease in the neck or head. These results were called by telephone at the time of interpretation on 10/18/2018 at 1:19 pm to Dr. Arther Dames , who verbally acknowledged these results. Electronically Signed   By: Marlan Palau M.D.   On: 10/03/2018 13:21   Mr Maxine Glenn Head Wo Contrast  Result Date: 09/28/2018 CLINICAL DATA:  Initial evaluation for acute stroke, found to have right M1 occlusion, status post catheter directed revascularization. EXAM: MRI HEAD WITHOUT CONTRAST MRA HEAD WITHOUT CONTRAST TECHNIQUE: Multiplanar, multiecho pulse sequences of the brain and surrounding structures were obtained without intravenous contrast. Angiographic images of the head were obtained using MRA technique without contrast. COMPARISON:  Prior CTs from 10/04/2018 FINDINGS: MRI HEAD FINDINGS Brain: Cerebral volume within normal limits for age. Mild for age chronic microvascular ischemic changes present within the periventricular white matter. Small focus of encephalomalacia at the lateral right cerebellum likely related to prior ischemia. Restricted diffusion seen involving the right frontal, temporal, and parietal lobes, compatible with acute right MCA territory infarct. Involvement of the right basal ganglia and internal capsule, with patchy involvement of the right centrum semi ovale. Associated regional mass effect with mild attenuation of the right lateral ventricle. No significant midline shift at this time. No hydrocephalus or ventricular  trapping. Basilar cisterns are patent. Scattered petechial hemorrhage within the area of infarction, primarily involving the basal ganglia and insular/subinsular region. No frank hemorrhagic transformation. Additional punctate acute ischemic nonhemorrhagic cortical infarct noted at the anterior left frontal lobe (series 5, image 88). No other evidence for acute or subacute ischemia. Gray-white matter differentiation otherwise maintained. No other evidence for acute or chronic intracranial hemorrhage. No mass lesion, midline shift or mass effect. No extra-axial fluid collection. Pituitary gland suprasellar region normal. Midline structures intact. Vascular: Major intracranial vascular flow voids are maintained. Skull and upper cervical spine: Craniocervical junction normal. Upper cervical spine within normal limits. Bone marrow signal intensity normal. No scalp soft tissue abnormality. Sinuses/Orbits: Globes and orbital soft tissues within normal limits. Moderate mucosal thickening throughout the ethmoidal air cells, right sphenoid sinus, and right greater than left maxillary sinuses. Fluid seen within the nasopharynx.  Patient likely intubated. Trace opacity noted within the bilateral mastoid air cells. Inner ear structures normal. Other: None. MRA HEAD FINDINGS ANTERIOR CIRCULATION: Distal cervical segments of the internal carotid arteries are patent with symmetric antegrade flow. Petrous, cavernous, and supraclinoid segments patent without flow-limiting stenosis. A1 segments patent bilaterally. Normal anterior communicating artery. Anterior cerebral arteries patent to their distal aspects without stenosis. M1 segments widely patent bilaterally. Previously seen right M1 occlusion has been revascularized. Normal MCA bifurcations. Symmetric and robust perfusion seen within the MCA branches bilaterally. POSTERIOR CIRCULATION: Vertebral arteries patent to the vertebrobasilar junction without stenosis. Patent left PICA.  Right PICA not seen. Basilar patent to its distal aspect without stenosis. Superior cerebral arteries patent bilaterally. Both of the posterior cerebral arteries supplied via the basilar as well small posterior communicating arteries. PCAs widely patent to their distal aspects. No intracranial aneurysm. IMPRESSION: MRI HEAD IMPRESSION: 1. Large acute right MCA territory infarct as above. Associated edema and mild regional mass effect without significant midline shift. Scattered small volume petechial hemorrhage without associated hemorrhagic transformation. 2. 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 3. Underlying mild chronic small vessel ischemic disease. MRA HEAD IMPRESSION: Normal intracranial MRA. Previously seen right M1 occlusion has been cleared, with wide patency and robust perfusion now seen throughout the right MCA and its branches. Electronically Signed   By: Rise Mu M.D.   On: 09/28/2018 01:33   Mr Brain Wo Contrast  Result Date: 09/28/2018 CLINICAL DATA:  Initial evaluation for acute stroke, found to have right M1 occlusion, status post catheter directed revascularization. EXAM: MRI HEAD WITHOUT CONTRAST MRA HEAD WITHOUT CONTRAST TECHNIQUE: Multiplanar, multiecho pulse sequences of the brain and surrounding structures were obtained without intravenous contrast. Angiographic images of the head were obtained using MRA technique without contrast. COMPARISON:  Prior CTs from 10/16/2018 FINDINGS: MRI HEAD FINDINGS Brain: Cerebral volume within normal limits for age. Mild for age chronic microvascular ischemic changes present within the periventricular white matter. Small focus of encephalomalacia at the lateral right cerebellum likely related to prior ischemia. Restricted diffusion seen involving the right frontal, temporal, and parietal lobes, compatible with acute right MCA territory infarct. Involvement of the right basal ganglia and internal capsule, with patchy involvement of the  right centrum semi ovale. Associated regional mass effect with mild attenuation of the right lateral ventricle. No significant midline shift at this time. No hydrocephalus or ventricular trapping. Basilar cisterns are patent. Scattered petechial hemorrhage within the area of infarction, primarily involving the basal ganglia and insular/subinsular region. No frank hemorrhagic transformation. Additional punctate acute ischemic nonhemorrhagic cortical infarct noted at the anterior left frontal lobe (series 5, image 88). No other evidence for acute or subacute ischemia. Gray-white matter differentiation otherwise maintained. No other evidence for acute or chronic intracranial hemorrhage. No mass lesion, midline shift or mass effect. No extra-axial fluid collection. Pituitary gland suprasellar region normal. Midline structures intact. Vascular: Major intracranial vascular flow voids are maintained. Skull and upper cervical spine: Craniocervical junction normal. Upper cervical spine within normal limits. Bone marrow signal intensity normal. No scalp soft tissue abnormality. Sinuses/Orbits: Globes and orbital soft tissues within normal limits. Moderate mucosal thickening throughout the ethmoidal air cells, right sphenoid sinus, and right greater than left maxillary sinuses. Fluid seen within the nasopharynx. Patient likely intubated. Trace opacity noted within the bilateral mastoid air cells. Inner ear structures normal. Other: None. MRA HEAD FINDINGS ANTERIOR CIRCULATION: Distal cervical segments of the internal carotid arteries are patent with symmetric antegrade flow. Petrous, cavernous, and supraclinoid  segments patent without flow-limiting stenosis. A1 segments patent bilaterally. Normal anterior communicating artery. Anterior cerebral arteries patent to their distal aspects without stenosis. M1 segments widely patent bilaterally. Previously seen right M1 occlusion has been revascularized. Normal MCA bifurcations.  Symmetric and robust perfusion seen within the MCA branches bilaterally. POSTERIOR CIRCULATION: Vertebral arteries patent to the vertebrobasilar junction without stenosis. Patent left PICA. Right PICA not seen. Basilar patent to its distal aspect without stenosis. Superior cerebral arteries patent bilaterally. Both of the posterior cerebral arteries supplied via the basilar as well small posterior communicating arteries. PCAs widely patent to their distal aspects. No intracranial aneurysm. IMPRESSION: MRI HEAD IMPRESSION: 1. Large acute right MCA territory infarct as above. Associated edema and mild regional mass effect without significant midline shift. Scattered small volume petechial hemorrhage without associated hemorrhagic transformation. 2. 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 3. Underlying mild chronic small vessel ischemic disease. MRA HEAD IMPRESSION: Normal intracranial MRA. Previously seen right M1 occlusion has been cleared, with wide patency and robust perfusion now seen throughout the right MCA and its branches. Electronically Signed   By: Rise Mu M.D.   On: 09/28/2018 01:33   Ct Head Code Stroke Wo Contrast  Result Date: 10/08/2018 CLINICAL DATA:  Code stroke. Left-sided weakness. On Xarelto for atrial fibrillation EXAM: CT HEAD WITHOUT CONTRAST TECHNIQUE: Contiguous axial images were obtained from the base of the skull through the vertex without intravenous contrast. COMPARISON:  CT head 11/14/2017 FINDINGS: Brain: Ill-defined hypodensity right basal ganglia compatible with early infarction involving the inferior basal ganglia, including the putamen and external capsule and possibly the inferior insula. Negative for hemorrhage or mass effect. No midline shift. Ventricle size normal. Vascular: Extensive hyperdense right MCA involving the entire right M1 extending into the right M2. Other vessels negative Skull: Negative Sinuses/Orbits: Mild mucosal edema right maxillary  sinus. Normal orbit Multiple dental caries Other: None ASPECTS ([place] Stroke Program Early CT Score) - Ganglionic level infarction (caudate, lentiform nuclei, internal capsule, insula, M1-M3 cortex): 5 - Supraganglionic infarction (M4-M6 cortex): 3 Total score (0-10 with 10 being normal): 8 IMPRESSION: 1. Hyperdense right MCA compatible with acute embolus. Early infarct in the right basal ganglia and insula 2. ASPECTS is 8 3. These results were called by telephone at the time of interpretation on 10/15/2018 at 1:08 pm to Dr. Laurence Slate , who verbally acknowledged these results. Electronically Signed   By: Marlan Palau M.D.   On: 10/10/2018 13:10   Cerebral Angiogram 10/15/2018 S/P rt common carotid arteriogram,followed by complete revascularization of RT MCA M 1 occlusion with x 2 passes with 5mm x 33mm embotrap retriever and x 2 passes with th the solitaire 4mm x 40 mmx retriever device achieving a TICI 2 b revascularization.  CT Head WO Contrast 09/29/2018 IMPRESSION: 1. Continued normal expected interval evolution of moderate right MCA territory infarct, stable in size and distribution as compared to previous MRI. Associated small volume petechial hemorrhage without hemorrhagic transformation, stable. 2. Increasing regional mass effect and edema with associated 4 mm right-to-left shift. No hydrocephalus or ventricular trapping. 3. Otherwise stable head CT, with no other new acute intracranial abnormality.  2D Echocardiogram   1. The left ventricle has normal systolic function with an ejection fraction of 60-65%. The cavity size was normal. There is mildly increased left ventricular wall thickness. Left ventricular diastolic parameters were normal.  2. The right ventricle has normal systolic function. The cavity was normal. There is no increase in right ventricular wall thickness.  3. Negative  bubble study for right to left shunt.  4. Trivial pericardial effusion is present.  5. Mild thickening of the  mitral valve leaflet.  6. The tricuspid valve is grossly normal.  7. The aortic valve is tricuspid. Mild thickening of the aortic valve. Sclerosis without any evidence of stenosis of the aortic valve.   PHYSICAL EXAM  Temp:  [98.6 F (37 C)-99.6 F (37.6 C)] 98.9 F (37.2 C) (04/11 0800) Pulse Rate:  [74-96] 96 (04/11 0930) Resp:  [15-30] 24 (04/11 0930) BP: (122-168)/(71-126) 154/126 (04/11 0800) SpO2:  [96 %-100 %] 97 % (04/11 0930) Arterial Line BP: (120-193)/(65-92) 183/87 (04/11 0930) Weight:  [97.6 kg] 97.6 kg (04/11 0500)  General - Well nourished, well developed, lethargic.  Ophthalmologic - fundi not visualized due to noncooperation.  Cardiovascular - Regular rate and rhythm.  Neuro - eyes  closed, however able to following simple commands on the right.   Bilateral ptosis,  not able to open eyes, eyes in mid position, pupil bilateral 2.5 mm, sluggish to light. No blinking to visual threat  bilaterally,  do not track, minimal extraocular movement  bilaterally.  Corneal reflex present, gag and cough present.  Bilateral facial weakness.  Tongue midline in mouth. Spontaneous moving RUE and RLE against gravity, RUE 3/5, RLE 2+/5. LUE 0/5 and LLE slight withdraw on pain stimulation. DTR 1+ and left babinski. Sensation and coordination not cooperative and gait not tested.   ASSESSMENT/PLAN [name] is a 48 y.o. male with history of HTN, DB, CHF and AF on Xarelto found down at work, presenting with L hemiparesis and R gaze.   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 Early infarct R BG and insula.    CTA head & neck ELO R ICA, R M1, R M2. R A1 clot.   Cerebral angio R M1 TICI2b reperfusion  CT head hyperdensity R BG, contrast vs hmg. No mass effect  MRI  Large R MCA infarct, associated edema and mass effect w/o shift. prob  small R cerebellar infarct. Small vessel disease.   MRA  Normal, previous R M1 open  2D Echo EF 60-65%. No source of embolus   LDL 73  HgbA1c 8.9  SCDs or VTE prophylaxis Xarelto (rivaroxaban) daily prior to admission, now on No antithrombotic.  On ASA 325 but consider resume DOAC in 5-7 days post stroke.   Therapy recommendations: CIR  Disposition:  pending   Right cerebral peduncle compression  09/28/2018 pt with right ptosis, right pupil dilation with reserved reflex, right gaze sustained nystagmus -> 09/29/2018 bilateral proptosis, minimal extraocular movement bilaterally, pupil equal sluggish to light, bilateral facial weakness, dysphagia  MRI right temporal pole edema with cerebral peduncle compression  CT repeat 09/29/2018- Increasing regional mass effect and edema with associated 4 mm right-to-left shift.   MRI repeat pending  continue 3% at this time via peripheral for 48 more hours  Na 142->142-146-148-154-153  Goal Na 150-155  Check Na q6h    Atrial Fibrillation w/ RVR  Home anticoagulation:  Xarelto (rivaroxaban) daily  . Home meds: amiodarone 200 . Rate controlled now . On ASA 325  Consider resume DOAC in 5-7 days post stroke.   Chronic combined systolic and diastolic CHF  Home meds: Coreg 37.5 bid, lasix 60 bid, hydralazine 50 bid, isosorbide 30, entresto bid, spironolactone 25  CXR 2018-10-22 lungs  clear  resumed home meds - Coreg 25 bid, lasix 60 bid, hydralazine 50 bid, isosorbide 30, entresto bid, spironolactone 25  Hypertension  Home meds:  Coreg 37.5 bid, lasix 60 bid, hydralazine 50 bid, isosorbide 30, entresto bid, spironolactone 25  BP goal < 180  Off cleviprex  On Coreg 25 bid, lasix 60 bid, hydralazine 50 bid, isosorbide 30, entresto bid, spironolactone 25 . Long-term BP goal normotensive  Hyperlipidemia  Home meds:  No statin  LDL 73, goal < 70  Add lipitor 20  Continue statin at discharge  Diabetes type II   Home meds:   Metformin 500 bid, insulin degludec 8u  HgbA1c 8.9, goal < 7.0  Uncontrolled  Hyperglycemia  Add lantus 10U Qhs  Hold off metformin due to elevated Cre  CBGs  SSI  Other Stroke Risk Factors  Obesity, Body mass index is 30.01 kg/m., recommend weight loss, diet and exercise as appropriate   Obstructive sleep apnea  Nonischemic CM  Other Active Problems  CKD stage II Cr 1.42->1.51->1.54->1.53  Hospital day # 2  This patient is critically ill due to right MCA large infarct, A. fib RVR, diabetes, hyperglycemia, CHF and at significant risk of neurological worsening, death form recurrent stroke, hemorrhagic conversion, heart failure, seizure, sepsis, aspiration pneumonia. This patient's care requires constant monitoring of vital signs, hemodynamics, respiratory and cardiac monitoring, review of multiple databases, neurological assessment, discussion with family, other specialists and medical decision making of high complexity. I spent 40 minutes of neurocritical care time in the care of this patient. I had long discussion with pt wife Helmut Muster over the phone, updated pt current condition, treatment plan and potential prognosis as well as further testing with MRI tomorrow. She expressed understanding and appreciation. I answered her questions to her satisfaction.  Marvel Plan, MD PhD Stroke Neurology 09/29/2018 6:12 PM   To contact Stroke Continuity provider, please refer to [email or web address]. After hours, contact General Neurology

## 2018-09-29 NOTE — Progress Notes (Signed)
Nutrition Brief Note  RD consulted for Enteral/tube feeding initiation and management. Pt was started on 1 can of Glucerna 1.5 QID + 30 ml Prostat TID yesterday. Plan to continue current intervention today, RN made aware. See full assessment on 4/10.   If nutrition issues arise, please re-consult RD.   Vanessa Kick RD, LDN Clinical Nutrition Pager # [phone]

## 2018-09-30 ENCOUNTER — Inpatient Hospital Stay (HOSPITAL_COMMUNITY): Payer: BLUE CROSS/BLUE SHIELD

## 2018-09-30 DIAGNOSIS — R509 Fever, unspecified: Secondary | ICD-10-CM

## 2018-09-30 LAB — POCT I-STAT 7, (LYTES, BLD GAS, ICA,H+H)
Acid-base deficit: 1 mmol/L (ref 0.0–2.0)
Acid-base deficit: 2 mmol/L (ref 0.0–2.0)
Bicarbonate: 23.4 mmol/L (ref 20.0–28.0)
Bicarbonate: 22.8 mmol/L (ref 20.0–28.0)
Calcium, Ion: 1.25 mmol/L (ref 1.15–1.40)
Calcium, Ion: 1.24 mmol/L (ref 1.15–1.40)
HCT: 34 % — ABNORMAL LOW (ref 39.0–52.0)
HCT: 33 % — ABNORMAL LOW (ref 39.0–52.0)
Hemoglobin: 11.6 g/dL — ABNORMAL LOW (ref 13.0–17.0)
Hemoglobin: 11.2 g/dL — ABNORMAL LOW (ref 13.0–17.0)
O2 Saturation: 97 %
O2 Saturation: 99 %
Patient temperature: 101.9
Patient temperature: 101.5
Potassium: 3.8 mmol/L (ref 3.5–5.1)
Potassium: 3.5 mmol/L (ref 3.5–5.1)
Sodium: 164 mmol/L (ref 135–145)
Sodium: 167 mmol/L (ref 135–145)
TCO2: 25 mmol/L (ref 22–32)
TCO2: 24 mmol/L (ref 22–32)
pCO2 arterial: 40.1 mmHg (ref 32.0–48.0)
pCO2 arterial: 39 mmHg (ref 32.0–48.0)
pH, Arterial: 7.382 (ref 7.350–7.450)
pH, Arterial: 7.381 (ref 7.350–7.450)
pO2, Arterial: 98 mmHg (ref 83.0–108.0)
pO2, Arterial: 125 mmHg — ABNORMAL HIGH (ref 83.0–108.0)

## 2018-09-30 LAB — CBC
HCT: 41.6 % (ref 39.0–52.0)
Hemoglobin: 12 g/dL — ABNORMAL LOW (ref 13.0–17.0)
MCH: 30.4 pg (ref 26.0–34.0)
MCHC: 28.8 g/dL — ABNORMAL LOW (ref 30.0–36.0)
MCV: 105.3 fL — ABNORMAL HIGH (ref 80.0–100.0)
Platelets: 149 10*3/uL — ABNORMAL LOW (ref 150–400)
RBC: 3.95 MIL/uL — ABNORMAL LOW (ref 4.22–5.81)
RDW: 14.3 % (ref 11.5–15.5)
WBC: 8.2 10*3/uL (ref 4.0–10.5)
nRBC: 0 % (ref 0.0–0.2)

## 2018-09-30 LAB — GLUCOSE, CAPILLARY
Glucose-Capillary: 287 mg/dL — ABNORMAL HIGH (ref 70–99)
Glucose-Capillary: 222 mg/dL — ABNORMAL HIGH (ref 70–99)
Glucose-Capillary: 175 mg/dL — ABNORMAL HIGH (ref 70–99)
Glucose-Capillary: 233 mg/dL — ABNORMAL HIGH (ref 70–99)
Glucose-Capillary: 284 mg/dL — ABNORMAL HIGH (ref 70–99)
Glucose-Capillary: 273 mg/dL — ABNORMAL HIGH (ref 70–99)

## 2018-09-30 LAB — BASIC METABOLIC PANEL
Anion gap: 9 (ref 5–15)
BUN: 23 mg/dL — ABNORMAL HIGH (ref 6–20)
CO2: 25 mmol/L (ref 22–32)
Calcium: 8.7 mg/dL — ABNORMAL LOW (ref 8.9–10.3)
Chloride: 129 mmol/L — ABNORMAL HIGH (ref 98–111)
Creatinine, Ser: 1.57 mg/dL — ABNORMAL HIGH (ref 0.61–1.24)
GFR calc Af Amer: 60 mL/min — ABNORMAL LOW (ref >60)
GFR calc non Af Amer: 51 mL/min — ABNORMAL LOW (ref >60)
Glucose, Bld: 298 mg/dL — ABNORMAL HIGH (ref 70–99)
Potassium: 4 mmol/L (ref 3.5–5.1)
Sodium: 163 mmol/L (ref 135–145)

## 2018-09-30 LAB — SODIUM
Sodium: 159 mmol/L — ABNORMAL HIGH (ref 135–145)
Sodium: 165 mmol/L (ref 135–145)
Sodium: 155 mmol/L — ABNORMAL HIGH (ref 135–145)

## 2018-09-30 LAB — TRIGLYCERIDES: Triglycerides: 91 mg/dL (ref <150)

## 2018-09-30 MED ORDER — DEXTROSE 5 % IV SOLN
INTRAVENOUS | Status: DC
  Administered 2018-09-30: 1 mL via INTRAVENOUS
  Administered 2018-10-03: 12:00:00 via INTRAVENOUS

## 2018-09-30 MED ORDER — PROPOFOL 1000 MG/100ML IV EMUL
0.0000 ug/kg/min | INTRAVENOUS | Status: DC
  Administered 2018-09-30: 10 ug/kg/min via INTRAVENOUS
  Filled 2018-09-30: qty 100

## 2018-09-30 MED ORDER — ISOSORBIDE DINITRATE 10 MG PO TABS
10.0000 mg | ORAL_TABLET | Freq: Three times a day (TID) | ORAL | Status: DC
  Administered 2018-09-30 – 2018-10-10 (×33): 10 mg
  Filled 2018-09-30 (×38): qty 1

## 2018-09-30 MED ORDER — ORAL CARE MOUTH RINSE
15.0000 mL | OROMUCOSAL | Status: DC
  Administered 2018-09-30 – 2018-10-01 (×14): 15 mL via OROMUCOSAL

## 2018-09-30 MED ORDER — MIDAZOLAM HCL 2 MG/2ML IJ SOLN: 2.0000 mg | INTRAMUSCULAR | Status: DC | PRN

## 2018-09-30 MED ORDER — ROCURONIUM BROMIDE 50 MG/5ML IV SOLN
100.0000 mg | Freq: Once | INTRAVENOUS | Status: AC
  Administered 2018-09-30: 100 mg via INTRAVENOUS

## 2018-09-30 MED ORDER — FENTANYL CITRATE (PF) 100 MCG/2ML IJ SOLN: 100.0000 ug | INTRAMUSCULAR | Status: DC | PRN

## 2018-09-30 MED ORDER — DOCUSATE SODIUM 50 MG/5ML PO LIQD: 100.0000 mg | Freq: Two times a day (BID) | ORAL | Status: DC | PRN

## 2018-09-30 MED ORDER — FREE WATER
300.0000 mL | Status: DC
  Administered 2018-09-30 – 2018-10-02 (×20): 300 mL

## 2018-09-30 MED ORDER — SODIUM CHLORIDE 0.9 % IV SOLN
3.0000 g | Freq: Four times a day (QID) | INTRAVENOUS | Status: DC
  Administered 2018-09-30 – 2018-10-04 (×15): 3 g via INTRAVENOUS
  Filled 2018-09-30 (×22): qty 3

## 2018-09-30 MED ORDER — ETOMIDATE 2 MG/ML IV SOLN
20.0000 mg | Freq: Once | INTRAVENOUS | Status: AC
  Administered 2018-09-30: 04:00:00 20 mg via INTRAVENOUS

## 2018-09-30 MED ORDER — CHLORHEXIDINE GLUCONATE 0.12% ORAL RINSE (MEDLINE KIT)
15.0000 mL | Freq: Two times a day (BID) | OROMUCOSAL | Status: DC
  Administered 2018-09-30 – 2018-10-01 (×3): 15 mL via OROMUCOSAL

## 2018-09-30 MED ORDER — ASPIRIN 325 MG PO TABS
325.0000 mg | ORAL_TABLET | Freq: Every day | ORAL | Status: DC
  Administered 2018-10-01 – 2018-10-10 (×10): 325 mg
  Filled 2018-09-30 (×11): qty 1

## 2018-09-30 MED ORDER — MIDAZOLAM HCL 2 MG/2ML IJ SOLN
INTRAMUSCULAR | Status: AC
  Administered 2018-09-30: 2 mg
  Filled 2018-09-30: qty 2

## 2018-09-30 MED ORDER — INSULIN GLARGINE 100 UNIT/ML ~~LOC~~ SOLN
15.0000 [IU] | Freq: Every day | SUBCUTANEOUS | Status: DC
  Administered 2018-09-30 – 2018-10-01 (×2): 15 [IU] via SUBCUTANEOUS
  Filled 2018-09-30 (×3): qty 0.15

## 2018-09-30 MED ORDER — INSULIN ASPART 100 UNIT/ML ~~LOC~~ SOLN
0.0000 [IU] | SUBCUTANEOUS | Status: DC
  Administered 2018-09-30: 13:00:00 4 [IU] via SUBCUTANEOUS
  Administered 2018-09-30 (×2): 7 [IU] via SUBCUTANEOUS
  Administered 2018-09-30 – 2018-10-01 (×2): 11 [IU] via SUBCUTANEOUS
  Administered 2018-10-01: 04:00:00 4 [IU] via SUBCUTANEOUS

## 2018-09-30 MED ORDER — FREE WATER: 300.0000 mL | Status: DC

## 2018-09-30 MED ORDER — FENTANYL CITRATE (PF) 100 MCG/2ML IJ SOLN
INTRAMUSCULAR | Status: AC
  Administered 2018-09-30: 50 ug
  Filled 2018-09-30: qty 2

## 2018-09-30 MED ORDER — DEXTROSE 5 % IV SOLN: INTRAVENOUS | Status: DC

## 2018-09-30 NOTE — Progress Notes (Signed)
Pharmacy Antibiotic Note  Johnathan Archer is a 48 y.o. male admitted on 10-27-2018 with stroke and underwent emergent mechanical thrombectomy. PTA Xarelto on hold.  Extubated on 4/11 then reintubated overnight. Temp to 102.  Pharmacy has been consulted for Unasyn dosing for aspiration pneumonia coverage. Blood, urine and tracheal aspirate culture sent.  Creatinine trended up but crcl remains > 50 ml/min.  Plan:  Unasyn 3 gm IV q6hrs  Follow renal function, culture data, clinical progress.  Height: 6' (182.9 cm) Weight: 213 lb 13.5 oz (97 kg) IBW/kg (Calculated) : 77.6  Temp (24hrs), Avg:100.4 F (38 C), Min:97.7 F (36.5 C), Max:102 F (38.9 C)  Recent Labs  Lab 10-27-18 1248 Oct 27, 2018 1252 09/28/18 0224 09/29/18 0320 09/30/18 0428  WBC 7.3  --  9.7 7.3 8.2  CREATININE 1.42* 1.30* 1.51* 1.53* 1.57*    Estimated Creatinine Clearance: 69.5 mL/min (A) (by C-G formula based on SCr of 1.57 mg/dL (H)).    No Known Allergies  Antimicrobials this admission:  Cefazolin 2gm IV x 1 pre-op on 4/9  Unasyn 4/12 >>  Dose adjustments this admission:  n/a  Microbiology results:  4/12 blood x 2 -  4/12 urine -  4/12 tracheal aspirate -  4/9 MRSA PCR negative  Thank you for allowing pharmacy to be a part of this patient's care.  Dennie Fetters, [place] Pager: [phone] or phone: [phone] 09/30/2018 11:34 AM

## 2018-09-30 NOTE — Progress Notes (Signed)
NAME:  Johnathan Archer, MRN:  [id number], DOB:  December 24, 1970, LOS: 3 ADMISSION DATE:  09/30/2018, CONSULTATION DATE:  10/10/2018 REFERRING MD:  Dr. Loni Beckwith, CHIEF COMPLAINT:  CVA  Brief History   48 yoM, LSW 1000 am, presenting with left sided paralysis and right gaze found to have acute right MCA M1 occlusion.  Difficult intubation for airway protection.  Taken for EVR with successful revascularization.    History of present illness   HPI obtained from medical chart review as patient is intubated/ sedated   48 year old male with history of Afib on xarelto, chronic systolic / dystolic HF, DMT2, HTN, MR, NICM, anemia, obesity, and OSA, last seen well around 1000 by coworkers found wedged between sink and bathroom stall at work.  Presented as code stroke with left sided paralysis and right gaze.  Hypertensive at 221/ 145.  Found on CT to have acute right MCA M1 occlusion.  Intubated for airway protection, was a difficult intubation.  Taken to IR with successful revascularization of right MCA M1 occlusion achieving TICI 2b revascularization.  Returns to ICU, PCCM to consult for ventilator management.   Past Medical History  Afib on xarelto, chronic systolic / dystolic HF, DMT2, HTN, MR, NICM, anemia, obesity, OSA, ED   Significant Hospital Events   4/9 Admitted / EVR  Consults:   Procedures:  4/9 ETT >>4/10; 4/11 >> 4/9 R radial aline >> 4/9 cerebral angiogram   Significant Diagnostic Tests:  4/9 CTH >> 1. Hyperdense right MCA compatible with acute embolus. Early infarct in the right basal ganglia and insula 2. 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 Poor flow in right MCA branches. Small amount of clot in right A1 segment. Findings compatible with acute embolus. No significant atherosclerotic disease in the neck or head.  4/9 CTH post intervention  >> Hyperdensity within the right basal ganglia may indicate contrast staining or petechial hemorrhage. No significant mass effect.  4/10 MRI brain >> evolving right MCA M1 stroke, culprit vessel is patent.  Some petechial changes without overt hemorrhagic conversion, no shift  Echocardiogram 4/10 >> normal LV function 60-65%, mildly increased LV wall thickness without diastolic dysfunction, normal RV function, no evidence of shunt by bubble study,  4/12 MRI Brain >> continued interval evolution large right MCA CVA, scattered petechial hemorrhage slightly increased, increased gyral swelling with edema and regional mass-effect.  Right to left shift increased from 4 mm to 6 mm.  No hydrocephalus.  A few new subcentimeter acute left cerebral infarcts  Micro Data:   Antimicrobials:  4/9 ancef preop  Interim history/subjective:  Required reintubation 4/11 for increased work of breathing, poor airway protection and evolving encephalopathy Repeat MRI brain as above Hyperglycemia overnight, SSI adjusted Interval rise in sodium, 167 this morning on 3% saline, held   Objective   Blood pressure (!) 146/79, pulse 92, temperature (!) 101.9 F (38.8 C), temperature source Axillary, resp. rate (!) 23, height 6' (1.829 m), weight 97 kg, SpO2 98 %.    Vent Mode: PRVC FiO2 (%):  [40 %-60 %] 40 % Set Rate:  [20 bmp] 20 bmp Vt Set:  [620 mL] 620 mL PEEP:  [5 cmH20] 5 cmH20 Plateau Pressure:  [16 cmH20-17 cmH20] 16 cmH20   Intake/Output Summary (Last 24 hours) at 09/30/2018 0939 Last data filed at 09/30/2018 0700 Gross per 24 hour  Intake 1708.37 ml  Output 2600 ml  Net -891.63 ml   Filed Weights   05-Oct-2018 1320 09/29/18 0500 09/30/18 0630  Weight: 105 kg 97.6 kg 97 kg    Examination: General: Ill-appearing gentleman, comfortable lying in bed HEENT: Oropharynx clear Neuro: Wakes easily to voice, able to phonate, follows commands on the right, flaccid on the left.  Somewhat weak cough CV:  Regular, 2/6 systolic murmur PULM: Overall clear with some referred upper airway noise from secretions GI: Soft, nondistended with positive bowel sounds Extremities: No significant edema Skin: No rash  Resolved Hospital Problem list    Assessment & Plan:  Right MCA M1 occlusion s/p EVR with successful revascularization P:  Plans per stroke team and interventional radiology, suspect the degree of his neurological injury impacting airway protection. Hypertonic saline held this morning 4/12 by neurology, low-dose free water with goal to get sodium back down to mid 150s Systolic blood pressure goal 120-140  Acute respiratory failure, impaired airway protection Difficult airway OSA P:  Required reintubation for secretion management and airway protection on 4/11.  Question whether he may require tracheostomy.  Hopefully there will be some neurological improvement, allow reattempt extubation without committing to trach Consider nocturnal CPAP if/when he is extubated depending on the degree of his airway protection  Dysphasia Continue tube feeding SLP follow-up if/when he is extubated again  Acute renal failure, consider hypertensive, contrast media.  Serum creatinine stable 1.57 P:  Continue to follow BMP, urine output  Fever, T 101.5 4/12 am Has received Tylenol Obtain blood, uring resp cx data 4/12 Add unasyn on 4/12 given evolving R sided infiltrate; high risk aspiration event  Afib on xarelto P:  Xarelto on hold, defer to neurology as to when this can be restarted in light of the petechial changes on his MRI brain 4/10 and 4/12 Amiodarone restarted 4/11  Chronic systolic/ diastolic HF - 2/20 EF 55%, normal RV, mod MR, mild dilation of aortic root.  Echocardiogram 4/11 with improved LV function and no evidence of impaired LV relaxation as above P:  Restarted home carvedilol at lower dose 4/11, Entresto on 4/12  Hypertension, UDS negative P:  SBP goals as above Added back  lower dose carvedilol 4/11 Entresto, aldactone, hydralazine restarted Lasix was started 4/12 but will hold given renal fxn and evolving hypernatremia  DMT2 P:  Sliding-scale insulin per protocol, resistant scale  Increase lantus to 15u on 4/12 Home metformin and Tresiba insulin are on hold  Hx normocytic anemia P:  Follow CBC  Best practice:  Diet: NPO, TF Pain/Anxiety/Delirium protocol (if indicated): fent, versed prn VAP protocol (if indicated): 4/11 DVT prophylaxis: SCDs GI prophylaxis: PPI Glucose control: SSI resistant  Mobility: BR Code Status: Full Family Communication: Dr Lucinda Dell discussed with wife 4/11 Disposition: ICU  Labs   CBC: Recent Labs  Lab Oct 05, 2018 1248  09/28/18 0224 09/28/18 0316 09/29/18 0320 09/30/18 0323 09/30/18 0428 09/30/18 0624  WBC 7.3  --  9.7  --  7.3  --  8.2  --   NEUTROABS 5.8  --  8.1*  --   --   --   --   --   HGB 12.5*   < > 11.9* 11.9* 11.2* 11.6* 12.0* 11.2*  HCT 38.7*   < > 37.0* 35.0* 36.8* 34.0* 41.6 33.0*  MCV 97.7  --  97.1  --  101.7*  --  105.3*  --   PLT 241  --  187  --  176  --  149*  --    < > =  values in this interval not displayed.    Basic Metabolic Panel: Recent Labs  Lab 09/24/2018 1248 10/16/2018 1252  09/28/18 0224 09/28/18 0316  09/29/18 0320  09/29/18 1953 09/30/18 0039 09/30/18 0323 09/30/18 0428 09/30/18 0624  NA 134*  --    < > 139 142   < > 154*   < > 157* 159* 164* 163* 167*  K 4.6  --    < > 3.9 3.9  --  3.9  --   --   --  3.8 4.0 3.5  CL 100  --   --  107  --   --  122*  --   --   --   --  129*  --   CO2 21*  --   --  20*  --   --  22  --   --   --   --  25  --   GLUCOSE 434*  --   --  186*  --   --  186*  --   --   --   --  298*  --   BUN 25*  --   --  25*  --   --  25*  --   --   --   --  23*  --   CREATININE 1.42* 1.30*  --  1.51*  --   --  1.53*  --   --   --   --  1.57*  --   CALCIUM 9.2  --   --  8.4*  --   --  8.3*  --   --   --   --  8.7*  --   MG  --   --   --  1.8  --   --    --   --   --   --   --   --   --   PHOS  --   --   --  3.3  --   --   --   --   --   --   --   --   --    < > = values in this interval not displayed.   GFR: Estimated Creatinine Clearance: 69.5 mL/min (A) (by C-G formula based on SCr of 1.57 mg/dL (H)). Recent Labs  Lab 10/09/2018 1248 09/28/18 0224 09/29/18 0320 09/30/18 0428  WBC 7.3 9.7 7.3 8.2    Liver Function Tests: Recent Labs  Lab 09/22/2018 1248  AST 19  ALT 22  ALKPHOS 50  BILITOT 1.7*  PROT 7.3  ALBUMIN 3.3*   No results for input(s): LIPASE, AMYLASE in the last 168 hours. No results for input(s): AMMONIA in the last 168 hours.  ABG    Component Value Date/Time   PHART 7.381 09/30/2018 0624   PCO2ART 39.0 09/30/2018 0624   PO2ART 125.0 (H) 09/30/2018 0624   HCO3 22.8 09/30/2018 0624   TCO2 24 09/30/2018 0624   ACIDBASEDEF 2.0 09/30/2018 0624   O2SAT 99.0 09/30/2018 0624     Coagulation Profile: Recent Labs  Lab 10/17/2018 1248  INR 2.6*    Cardiac Enzymes: No results for input(s): CKTOTAL, CKMB, CKMBINDEX, TROPONINI in the last 168 hours.  HbA1C: Hgb A1c MFr Bld  Date/Time Value Ref Range Status  09/28/2018 02:24 AM 8.9 (H) 4.8 - 5.6 % Final    Comment:    (NOTE) Pre diabetes:          5.7%-6.4% Diabetes:              >  6.4% Glycemic control for   <7.0% adults with diabetes   03/11/2017 07:41 PM 8.0 (H) 4.8 - 5.6 % Final    Comment:    (NOTE) Pre diabetes:          5.7%-6.4% Diabetes:              >6.4% Glycemic control for   <7.0% adults with diabetes     CBG: Recent Labs  Lab 09/29/18 1541 09/29/18 1929 09/29/18 2336 09/30/18 0420 09/30/18 0801  GLUCAP 219* 224* 239* 287* 222*    Independent CC time 34 minutes  Levy Pupa, MD, PhD 09/30/2018, 9:39 AM [facility] [phone] or if no answer [phone]

## 2018-09-30 NOTE — Progress Notes (Signed)
OVERNIGHT PCCM NOTE  Indication: called to bedside for increased wob  Evaluation: patient was breathing around 30 times a minute with accessory muscle use.  rhonchorus breath sounds heard on exam.   NT suction was tried multiple times without success.  Nebulizer treatment also attempted.  Patient wife called and consented for intubation for WOB.    Procedure:  ETT placed 24 cm at lip.  Placement confirmed with etc02, auscultation.  cxr pending.    Plan:  - pending cxr  - will go to MRI later tonite

## 2018-09-30 NOTE — Progress Notes (Signed)
Pt transported from 4N29 to CT on vent and back with no complications.

## 2018-09-30 NOTE — Progress Notes (Signed)
Serum Na+ 163, 3% turned off and notified Dr. Wilford Corner

## 2018-09-30 NOTE — Procedures (Signed)
Endotracheal Intubation Procedure Note  Indication for endotracheal intubation: airway compromise, impending airway compromise and impending respiratory failure. Airway Assessment: Mallampati Class: I (soft palate, uvula, fauces, and tonsillar pillars visible). Sedation: etomidate. Paralytic: rocuronium. Lidocaine: no. Atropine: no. Equipment: video laryngoscope blade. Cricoid Pressure: yes. Number of attempts: 1. ETT location confirmed by by auscultation, by CXR and ETCO2 monitor.  Johnathan Archer 09/30/2018

## 2018-09-30 NOTE — Progress Notes (Signed)
After CXR, ETT advanced 2cm per respiratory therapy d/t cuff leak

## 2018-09-30 NOTE — Progress Notes (Signed)
STROKE TEAM PROGRESS NOTE   INTERVAL HISTORY Pt RN and Dr. Delton Coombes are at bedside.  Patient was re-intubated yesterday afternoon for airway protection. He continues to have fever this am and Tmax 101.9 and blood and urine culture were ordered. He had MRI this am did not show significant changes from last MRI but with mild progression of mass effect. Na 167 will start free water.   Vitals:   09/30/18 0615 09/30/18 0630 09/30/18 0645 09/30/18 0700  BP: (!) 141/80 (!) 141/76 (!) 153/82 (!) 146/79  Pulse: 91 92 92 92  Resp: (!) 23 (!) 24 (!) 24 (!) 23  Temp:  (!) 102 F (38.9 C)    TempSrc:      SpO2: 100% 100% 100% 98%  Weight:  97 kg    Height:        CBC:  Recent Labs  Lab October 15, 2018 1248  09/28/18 0224  09/29/18 0320  09/30/18 0428 09/30/18 0624  WBC 7.3  --  9.7  --  7.3  --  8.2  --   NEUTROABS 5.8  --  8.1*  --   --   --   --   --   HGB 12.5*   < > 11.9*   < > 11.2*   < > 12.0* 11.2*  HCT 38.7*   < > 37.0*   < > 36.8*   < > 41.6 33.0*  MCV 97.7  --  97.1  --  101.7*  --  105.3*  --   PLT 241  --  187  --  176  --  149*  --    < > = values in this interval not displayed.    Basic Metabolic Panel:  Recent Labs  Lab 09/28/18 0224  09/29/18 0320  09/30/18 0428 09/30/18 0624  NA 139   < > 154*   < > 163* 167*  K 3.9   < > 3.9   < > 4.0 3.5  CL 107  --  122*  --  129*  --   CO2 20*  --  22  --  25  --   GLUCOSE 186*  --  186*  --  298*  --   BUN 25*  --  25*  --  23*  --   CREATININE 1.51*  --  1.53*  --  1.57*  --   CALCIUM 8.4*  --  8.3*  --  8.7*  --   MG 1.8  --   --   --   --   --   PHOS 3.3  --   --   --   --   --    < > = values in this interval not displayed.   Lipid Panel:     Component Value Date/Time   CHOL 134 09/28/2018 0224   TRIG 91 09/30/2018 0428   HDL 53 09/28/2018 0224   CHOLHDL 2.5 09/28/2018 0224   VLDL 8 09/28/2018 0224   LDLCALC 73 09/28/2018 0224   HgbA1c:  Lab Results  Component Value Date   HGBA1C 8.9 (H) 09/28/2018   Urine Drug  Screen:     Component Value Date/Time   LABOPIA NONE DETECTED 09/28/2018 0230   COCAINSCRNUR NONE DETECTED 09/28/2018 0230   COCAINSCRNUR NEGATIVE 11/10/2011 0739   LABBENZ NONE DETECTED 09/28/2018 0230   LABBENZ NEGATIVE 11/10/2011 0739   AMPHETMU NONE DETECTED 09/28/2018 0230   THCU NONE DETECTED 09/28/2018 0230   LABBARB NONE DETECTED 09/28/2018 0230  Alcohol Level     Component Value Date/Time   ETH <10 09/28/2018 1248    IMAGING Ct Angio Head W Or Wo Contrast  Result Date: 10/10/2018 CLINICAL DATA:  Stroke.  Left-sided weakness EXAM: CT ANGIOGRAPHY HEAD AND NECK TECHNIQUE: Multidetector CT imaging of the head and neck was performed using the standard protocol during bolus administration of intravenous contrast. Multiplanar CT image reconstructions and MIPs were obtained to evaluate the vascular anatomy. Carotid stenosis measurements (when applicable) are obtained utilizing NASCET criteria, using the distal internal carotid diameter as the denominator. CONTRAST:  61mL OMNIPAQUE IOHEXOL 350 MG/ML SOLN COMPARISON:  CT head 10/15/2018 FINDINGS: CTA NECK FINDINGS Aortic arch: Standard branching. Imaged portion shows no evidence of aneurysm or dissection. No significant stenosis of the major arch vessel origins. Right carotid system: Right carotid system widely patent. No stenosis or atherosclerotic disease Left carotid system: Left carotid system widely patent. No stenosis or atherosclerotic disease Vertebral arteries: Both vertebral arteries are widely patent without significant stenosis or atherosclerotic disease. Skeleton: No acute skeletal abnormality. Other neck: Negative for mass or soft tissue swelling in the neck. Upper chest: Lung apices clear bilaterally. Review of the MIP images confirms the above findings CTA HEAD FINDINGS Anterior circulation: Right cavernous carotid patent. There is occlusion of the supraclinoid internal carotid artery just proximal to the bifurcation. Clot  extends into the right middle cerebral artery M1 which is occluded. There is little flow in right MCA branches. Small amount of clot in the proximal right A1 segment which shows good flow possibly from the left side. Both anterior cerebral arteries are patent Left cavernous carotid widely patent. Left anterior and middle cerebral arteries widely patent without stenosis. Posterior circulation: No significant stenosis, proximal occlusion, aneurysm, or vascular malformation. 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 Poor flow in right MCA branches. Small amount of clot in right A1 segment. Findings compatible with acute embolus. No significant atherosclerotic disease in the neck or head. These results were called by telephone at the time of interpretation on 10/03/2018 at 1:19 pm to Dr. Arther Dames , who verbally acknowledged these results. Electronically Signed   By: Marlan Palau M.D.   On: 10/17/2018 13:21   Ct Head Wo Contrast  Result Date: 09/26/2018 CLINICAL DATA:  Stroke follow-up EXAM: CT HEAD WITHOUT CONTRAST TECHNIQUE: Contiguous axial images were obtained from the base of the skull through the vertex without intravenous contrast. COMPARISON:  CTA head neck 10/01/2018 FINDINGS: Brain: There is hyperdensity within the right basal ganglia. No midline shift. Mild mass effect on the right lateral ventricle. Left hemisphere is normal. Vascular: Intravascular enhancement secondary to recent contrast administration. Skull: The visualized skull base, calvarium and extracranial soft tissues are normal. Sinuses/Orbits: No fluid levels or advanced mucosal thickening of the visualized paranasal sinuses. No mastoid or middle ear effusion. The orbits are normal. IMPRESSION: Hyperdensity  within the right basal ganglia may indicate contrast staining or petechial hemorrhage. No significant mass effect. Electronically Signed   By: Deatra Robinson M.D.   On: 09/30/2018 16:33   Ct Angio Neck W Or Wo Contrast  Result Date: 10/05/2018 CLINICAL DATA:  Stroke.  Left-sided weakness EXAM: CT ANGIOGRAPHY HEAD AND NECK TECHNIQUE: Multidetector CT imaging of the head and neck was performed using the standard protocol during  bolus administration of intravenous contrast. Multiplanar CT image reconstructions and MIPs were obtained to evaluate the vascular anatomy. Carotid stenosis measurements (when applicable) are obtained utilizing NASCET criteria, using the distal internal carotid diameter as the denominator. CONTRAST:  54mL OMNIPAQUE IOHEXOL 350 MG/ML SOLN COMPARISON:  CT head 09/26/2018 FINDINGS: CTA NECK FINDINGS Aortic arch: Standard branching. Imaged portion shows no evidence of aneurysm or dissection. No significant stenosis of the major arch vessel origins. Right carotid system: Right carotid system widely patent. No stenosis or atherosclerotic disease Left carotid system: Left carotid system widely patent. No stenosis or atherosclerotic disease Vertebral arteries: Both vertebral arteries are widely patent without significant stenosis or atherosclerotic disease. Skeleton: No acute skeletal abnormality. Other neck: Negative for mass or soft tissue swelling in the neck. Upper chest: Lung apices clear bilaterally. Review of the MIP images confirms the above findings CTA HEAD FINDINGS Anterior circulation: Right cavernous carotid patent. There is occlusion of the supraclinoid internal carotid artery just proximal to the bifurcation. Clot extends into the right middle cerebral artery M1 which is occluded. There is little flow in right MCA branches. Small amount of clot in the proximal right A1 segment which shows good flow possibly from the left side. Both anterior cerebral arteries are patent Left cavernous  carotid widely patent. Left anterior and middle cerebral arteries widely patent without stenosis. Posterior circulation: No significant stenosis, proximal occlusion, aneurysm, or vascular malformation. 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 Poor flow in right MCA branches. Small amount of clot in right A1 segment. Findings compatible with acute embolus. No significant atherosclerotic disease in the neck or head. These results were called by telephone at the time of interpretation on 10/06/2018 at 1:19 pm to Dr. Arther Dames , who verbally acknowledged these results. Electronically Signed   By: Marlan Palau M.D.   On: 10/18/2018 13:21   Mr Maxine Glenn Head Wo Contrast  Result Date: 09/28/2018 CLINICAL DATA:  Initial evaluation for acute stroke, found to have right M1 occlusion, status post catheter directed revascularization. EXAM: MRI HEAD WITHOUT CONTRAST MRA HEAD WITHOUT CONTRAST TECHNIQUE: Multiplanar, multiecho pulse sequences of the brain and surrounding structures were obtained without intravenous contrast. Angiographic images of the head were obtained using MRA technique without contrast. COMPARISON:  Prior CTs from 10/12/2018 FINDINGS: MRI HEAD FINDINGS Brain: Cerebral volume within normal limits for age. Mild for age chronic microvascular ischemic changes present within the periventricular white matter. Small focus of encephalomalacia at the lateral right cerebellum likely related to prior ischemia. Restricted diffusion seen involving the right frontal, temporal, and parietal lobes, compatible with acute right MCA territory infarct. Involvement of the right basal ganglia and internal capsule, with patchy involvement of the right centrum semi ovale. Associated regional mass  effect with mild attenuation of the right lateral ventricle. No significant midline shift at this time. No hydrocephalus or ventricular trapping. Basilar cisterns are patent. Scattered petechial hemorrhage within the area of infarction, primarily involving the basal ganglia and insular/subinsular region. No frank hemorrhagic transformation. Additional punctate acute ischemic nonhemorrhagic cortical infarct noted at the anterior left frontal lobe (series 5, image 88). No other evidence for acute or subacute ischemia. Gray-white matter differentiation otherwise maintained. No other evidence for acute or chronic intracranial hemorrhage. No mass lesion, midline shift or mass effect. No  extra-axial fluid collection. Pituitary gland suprasellar region normal. Midline structures intact. Vascular: Major intracranial vascular flow voids are maintained. Skull and upper cervical spine: Craniocervical junction normal. Upper cervical spine within normal limits. Bone marrow signal intensity normal. No scalp soft tissue abnormality. Sinuses/Orbits: Globes and orbital soft tissues within normal limits. Moderate mucosal thickening throughout the ethmoidal air cells, right sphenoid sinus, and right greater than left maxillary sinuses. Fluid seen within the nasopharynx. Patient likely intubated. Trace opacity noted within the bilateral mastoid air cells. Inner ear structures normal. Other: None. MRA HEAD FINDINGS ANTERIOR CIRCULATION: Distal cervical segments of the internal carotid arteries are patent with symmetric antegrade flow. Petrous, cavernous, and supraclinoid segments patent without flow-limiting stenosis. A1 segments patent bilaterally. Normal anterior communicating artery. Anterior cerebral arteries patent to their distal aspects without stenosis. M1 segments widely patent bilaterally. Previously seen right M1 occlusion has been revascularized. Normal MCA bifurcations. Symmetric and robust perfusion seen within the MCA  branches bilaterally. POSTERIOR CIRCULATION: Vertebral arteries patent to the vertebrobasilar junction without stenosis. Patent left PICA. Right PICA not seen. Basilar patent to its distal aspect without stenosis. Superior cerebral arteries patent bilaterally. Both of the posterior cerebral arteries supplied via the basilar as well small posterior communicating arteries. PCAs widely patent to their distal aspects. No intracranial aneurysm. IMPRESSION: MRI HEAD IMPRESSION: 1. Large acute right MCA territory infarct as above. Associated edema and mild regional mass effect without significant midline shift. Scattered small volume petechial hemorrhage without associated hemorrhagic transformation. 2. 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 3. Underlying mild chronic small vessel ischemic disease. MRA HEAD IMPRESSION: Normal intracranial MRA. Previously seen right M1 occlusion has been cleared, with wide patency and robust perfusion now seen throughout the right MCA and its branches. Electronically Signed   By: Rise Mu M.D.   On: 09/28/2018 01:33   Mr Brain Wo Contrast  Result Date: 09/28/2018 CLINICAL DATA:  Initial evaluation for acute stroke, found to have right M1 occlusion, status post catheter directed revascularization. EXAM: MRI HEAD WITHOUT CONTRAST MRA HEAD WITHOUT CONTRAST TECHNIQUE: Multiplanar, multiecho pulse sequences of the brain and surrounding structures were obtained without intravenous contrast. Angiographic images of the head were obtained using MRA technique without contrast. COMPARISON:  Prior CTs from 10/15/2018 FINDINGS: MRI HEAD FINDINGS Brain: Cerebral volume within normal limits for age. Mild for age chronic microvascular ischemic changes present within the periventricular white matter. Small focus of encephalomalacia at the lateral right cerebellum likely related to prior ischemia. Restricted diffusion seen involving the right frontal, temporal, and parietal lobes,  compatible with acute right MCA territory infarct. Involvement of the right basal ganglia and internal capsule, with patchy involvement of the right centrum semi ovale. Associated regional mass effect with mild attenuation of the right lateral ventricle. No significant midline shift at this time. No hydrocephalus or ventricular trapping. Basilar cisterns are patent. Scattered petechial hemorrhage within the area of infarction, primarily involving the basal ganglia and insular/subinsular region. No frank hemorrhagic transformation. Additional punctate acute ischemic nonhemorrhagic cortical infarct noted at the anterior left frontal lobe (series 5, image 88). No other evidence for acute or subacute ischemia. Gray-white matter differentiation otherwise maintained. No other evidence for acute or chronic intracranial hemorrhage. No mass lesion, midline shift or mass effect. No extra-axial fluid collection. Pituitary gland suprasellar region normal. Midline structures intact. Vascular: Major intracranial vascular flow voids are maintained. Skull and upper cervical spine: Craniocervical junction normal. Upper cervical spine within normal limits. Bone marrow signal intensity normal. No scalp soft  tissue abnormality. Sinuses/Orbits: Globes and orbital soft tissues within normal limits. Moderate mucosal thickening throughout the ethmoidal air cells, right sphenoid sinus, and right greater than left maxillary sinuses. Fluid seen within the nasopharynx. Patient likely intubated. Trace opacity noted within the bilateral mastoid air cells. Inner ear structures normal. Other: None. MRA HEAD FINDINGS ANTERIOR CIRCULATION: Distal cervical segments of the internal carotid arteries are patent with symmetric antegrade flow. Petrous, cavernous, and supraclinoid segments patent without flow-limiting stenosis. A1 segments patent bilaterally. Normal anterior communicating artery. Anterior cerebral arteries patent to their distal aspects  without stenosis. M1 segments widely patent bilaterally. Previously seen right M1 occlusion has been revascularized. Normal MCA bifurcations. Symmetric and robust perfusion seen within the MCA branches bilaterally. POSTERIOR CIRCULATION: Vertebral arteries patent to the vertebrobasilar junction without stenosis. Patent left PICA. Right PICA not seen. Basilar patent to its distal aspect without stenosis. Superior cerebral arteries patent bilaterally. Both of the posterior cerebral arteries supplied via the basilar as well small posterior communicating arteries. PCAs widely patent to their distal aspects. No intracranial aneurysm. IMPRESSION: MRI HEAD IMPRESSION: 1. Large acute right MCA territory infarct as above. Associated edema and mild regional mass effect without significant midline shift. Scattered small volume petechial hemorrhage without associated hemorrhagic transformation. 2. 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 3. Underlying mild chronic small vessel ischemic disease. MRA HEAD IMPRESSION: Normal intracranial MRA. Previously seen right M1 occlusion has been cleared, with wide patency and robust perfusion now seen throughout the right MCA and its branches. Electronically Signed   By: Rise Mu M.D.   On: 09/28/2018 01:33   Ct Head Code Stroke Wo Contrast  Result Date: 10/05/2018 CLINICAL DATA:  Code stroke. Left-sided weakness. On Xarelto for atrial fibrillation EXAM: CT HEAD WITHOUT CONTRAST TECHNIQUE: Contiguous axial images were obtained from the base of the skull through the vertex without intravenous contrast. COMPARISON:  CT head 11/14/2017 FINDINGS: Brain: Ill-defined hypodensity right basal ganglia compatible with early infarction involving the inferior basal ganglia, including the putamen and external capsule and possibly the inferior insula. Negative for hemorrhage or mass effect. No midline shift. Ventricle size normal. Vascular: Extensive hyperdense right MCA involving  the entire right M1 extending into the right M2. Other vessels negative Skull: Negative Sinuses/Orbits: Mild mucosal edema right maxillary sinus. Normal orbit Multiple dental caries Other: None ASPECTS ([place] Stroke Program Early CT Score) - Ganglionic level infarction (caudate, lentiform nuclei, internal capsule, insula, M1-M3 cortex): 5 - Supraganglionic infarction (M4-M6 cortex): 3 Total score (0-10 with 10 being normal): 8 IMPRESSION: 1. Hyperdense right MCA compatible with acute embolus. Early infarct in the right basal ganglia and insula 2. ASPECTS is 8 3. These results were called by telephone at the time of interpretation on 10/07/2018 at 1:08 pm to Dr. Laurence Slate , who verbally acknowledged these results. Electronically Signed   By: Marlan Palau M.D.   On: 10/13/2018 13:10   Cerebral Angiogram 10/17/2018 S/P rt common carotid arteriogram,followed by complete revascularization of RT MCA M 1 occlusion with x 2 passes with 5mm x 33mm embotrap retriever and x 2 passes with th the solitaire 4mm x 40 mmx retriever device achieving a TICI 2 b revascularization.  CT Head WO Contrast 09/29/2018 IMPRESSION: 1. Continued normal expected interval evolution of moderate right MCA territory infarct, stable in size and distribution as compared to previous MRI. Associated small volume petechial hemorrhage without hemorrhagic transformation, stable. 2. Increasing regional mass effect and edema with associated 4 mm right-to-left shift. No hydrocephalus or ventricular trapping.  3. Otherwise stable head CT, with no other new acute intracranial abnormality.  2D Echocardiogram   1. The left ventricle has normal systolic function with an ejection fraction of 60-65%. The cavity size was normal. There is mildly increased left ventricular wall thickness. Left ventricular diastolic parameters were normal.  2. The right ventricle has normal systolic function. The cavity was normal. There is no increase in right ventricular  wall thickness.  3. Negative bubble study for right to left shunt.  4. Trivial pericardial effusion is present.  5. Mild thickening of the mitral valve leaflet.  6. The tricuspid valve is grossly normal.  7. The aortic valve is tricuspid. Mild thickening of the aortic valve. Sclerosis without any evidence of stenosis of the aortic valve.  Mr Brain Wo Contrast 09/30/2018 CLINICAL DATA:  Follow-up examination for acute stroke. EXAM: MRI HEAD WITHOUT CONTRAST TECHNIQUE: Multiplanar, multiecho pulse sequences of the brain and surrounding structures were obtained without intravenous contrast. COMPARISON:  Comparison made with prior CT from 09/29/2018 and previous MRI from 09/28/2018. FINDINGS: Brain: Continued interval evolution of large right MCA territory infarct involving the right frontal, temporal, and occipital lobes, with prominent involvement of the right basal ganglia. Overall, distribution relatively stable from previous. Associated scattered petechial hemorrhage throughout the area of infarction has increased from previous. Additionally, associated gyral swelling and edema with regional mass effect also continues to increase, with increased mass effect on the right lateral ventricle as compared to previous MRI. Right-to-left shift now measures 6 mm, previously 4 mm on prior head CT. No hydrocephalus or ventricular trapping. Mild basilar cistern crowding without effacement. Previously noted punctate left frontal cortical infarct again noted. Few additional scattered subcentimeter acute ischemic infarcts now seen within the left frontal lobe (series 5, images 79, 78, 81, 87), not definitely seen on previous. Few additional small punctate infarcts noted along the anterior genu of the corpus callosum (series 5, image 75). No significant mass effect or hemorrhage. Small chronic right occipital and right cerebellar infarcts again noted. Remainder the brain is otherwise stable in appearance with no new  finding. Vascular: Major intracranial vascular flow voids maintained. Skull and upper cervical spine: Craniocervical junction normal. Upper cervical spine normal. No focal marrow replacing lesion. Scalp soft tissues unremarkable. Sinuses/Orbits: Globes and orbital soft tissues within normal limits. Moderate mucosal thickening throughout the paranasal sinuses, overall worse on the right. Nasogastric tube partially visualized. Small right greater than left mastoid effusions. Other: None. IMPRESSION: 1. Continued interval evolution of large right MCA territory infarct, overall stable in distribution from previous MRI. Associated petechial hemorrhage has increased from previous without frank hemorrhagic transformation. 2. Increasing regional mass effect with slightly worsened 6 mm of right-to-left midline shift. No hydrocephalus or ventricular trapping. 3. Few additional new subcentimeter acute ischemic nonhemorrhagic infarcts involving the left cerebral hemisphere as above. 4. Otherwise stable brain MRI with small chronic right occipital and cerebellar infarcts. No other new acute intracranial abnormality. Electronically Signed   By: Rise Mu M.D.   On: 09/30/2018 06:53    PHYSICAL EXAM  Temp:  [97.7 F (36.5 C)-102 F (38.9 C)] 102 F (38.9 C) (04/12 0630) Pulse Rate:  [80-107] 92 (04/12 0700) Resp:  [0-39] 23 (04/12 0700) BP: (133-195)/(73-138) 146/79 (04/12 0700) SpO2:  [89 %-100 %] 98 % (04/12 0700) Arterial Line BP: (156-183)/(73-87) 172/80 (04/11 1200) FiO2 (%):  [40 %-60 %] 40 % (04/12 0725) Weight:  [97 kg] 97 kg (04/12 0630)  General - Well nourished, well developed, intubated not  on sedation,   Ophthalmologic - fundi not visualized due to noncooperation.  Cardiovascular - Regular rate and rhythm, not in afib.  Neuro - intubated off sedation, lethargic, eyes closed (eyelid apraxia??), however able to following simple commands on the right. Not able to open eyes, eyes  disconjugate, with left eye upward and right eye downward position, pupil bilateral 2.5 mm, sluggish to light. No blinking to visual threat bilaterally, right gaze with sustained nystagmus, left gaze palsy. Corneal reflex weak on the left but present on the right, gag and cough present.  Bilateral facial weakness. Tongue midline in mouth. Spontaneous moving RUE and RLE barely against gravity, RUE 3-/5, RLE 2/5. LUE 0/5 and LLE slight withdraw on pain stimulation. DTR 1+ and left babinski. Sensation and coordination not cooperative and gait not tested.   ASSESSMENT/PLAN Mr. KRISTI HYER is a 48 y.o. male with history of HTN, DB, CHF and AF on Xarelto found down at work, presenting with L hemiparesis and R gaze.   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 Early infarct R BG and insula.    CTA head & neck ELO R ICA, R M1, R M2. R A1 clot.   Cerebral angio R M1 TICI2b reperfusion  CT head hyperdensity R BG, contrast vs hmg. No mass effect  MRI  Large R MCA infarct, associated edema and mass effect w/o shift. prob small R cerebellar infarct. Small vessel disease.   MRA  Normal, previous R M1 open  2D Echo EF 60-65%. No source of embolus   LDL 73  HgbA1c 8.9  SCDs or VTE prophylaxis Xarelto (rivaroxaban) daily prior to admission, now on No antithrombotic.  On ASA 325 but consider resume DOAC in 5-7 days post stroke.   Therapy recommendations: CIR  Disposition:  pending   Right cerebral peduncle compression  09/28/2018 pt with right ptosis, right pupil dilation with reserved reflex, right gaze sustained nystagmus -> 09/29/2018 bilateral proptosis, minimal extraocular movement bilaterally, pupil equal sluggish to light, bilateral facial weakness, dysphagia  MRI right temporal pole edema with cerebral peduncle compression  CT repeat 09/29/2018-  Increasing regional mass effect and edema with associated 4 mm right-to-left shift.   MRI repeat increased MLS to 6mm, brainstem compression stable  Na 142->142-146-148-154-153->159->164->167  Goal Na 150-155  Check Na q6h  On free water and D5 for 24h and re-evaluate  Atrial Fibrillation w/ RVR  Home anticoagulation:  Xarelto (rivaroxaban) daily  . Home meds: amiodarone 200 . Rate controlled now . On ASA 325  Consider resume DOAC in 5-7 days post stroke.   Chronic combined systolic and diastolic CHF  Home meds: Coreg 37.5 bid, lasix 60 bid, hydralazine 50 bid, isosorbide 30, entresto bid, spironolactone 25  CXR 2018-10-08 lungs clear  resumed home meds - Coreg 25 bid, lasix 60 bid, hydralazine 50 bid, isosorbide 30, entresto bid, spironolactone 25  Fever   Tmax 100.5->101.9  Blood culture pending  Urine culture pending  CCM on board  Continue hydration  CXR showed right basal small pleural effusion  Hypertension  Home meds:  Coreg 37.5 bid, lasix 60 bid, hydralazine 50 bid, isosorbide 30, entresto bid, spironolactone 25  BP goal < 180  Off cleviprex  On Coreg 25 bid, lasix 60 bid, hydralazine 50 bid, isosorbide 30, entresto bid, spironolactone 25 . Long-term BP goal normotensive  Hyperlipidemia  Home meds:  No statin  LDL 73, goal < 70  Add lipitor 20  Continue statin at discharge  Diabetes type II   Home meds:  Metformin 500 bid, insulin degludec 8u  HgbA1c 8.9, goal < 7.0  Uncontrolled  Hyperglycemia  CCM on board  Hold off metformin due to elevated Cre  CBGs  SSI  Other Stroke Risk Factors  Obesity, Body mass index is 29 kg/m., recommend weight loss, diet and exercise as appropriate   Obstructive sleep apnea  Nonischemic CM  Other Active Problems  CKD stage II Cr 1.42->1.51->1.54->1.53->1.57  Hospital day # 3  This patient is critically ill due to right MCA large infarct, A. fib RVR, diabetes, hyperglycemia, CHF and at  significant risk of neurological worsening, death form recurrent stroke, hemorrhagic conversion, heart failure, seizure, sepsis, aspiration pneumonia. This patient's care requires constant monitoring of vital signs, hemodynamics, respiratory and cardiac monitoring, review of multiple databases, neurological assessment, discussion with family, other specialists and medical decision making of high complexity. I spent 40 minutes of neurocritical care time in the care of this patient. I had long discussion with pt wife Helmut Musterlicia over the phone, updated pt current condition, treatment plan and potential prognosis. She expressed understanding and appreciation. I answered her questions to her satisfaction.  Marvel PlanJindong Mohmed Farver, MD PhD Stroke Neurology 09/30/2018 10:42 AM   To contact Stroke Continuity provider, please refer to [email or web address]. After hours, contact General Neurology

## 2018-09-30 NOTE — Progress Notes (Signed)
ETT advance 2cm.  ETT now at 26 at the lips.

## 2018-10-01 ENCOUNTER — Encounter (HOSPITAL_COMMUNITY): Payer: Self-pay | Admitting: Interventional Radiology

## 2018-10-01 DIAGNOSIS — E870 Hyperosmolality and hypernatremia: Secondary | ICD-10-CM

## 2018-10-01 DIAGNOSIS — I639 Cerebral infarction, unspecified: Secondary | ICD-10-CM

## 2018-10-01 LAB — BASIC METABOLIC PANEL
Anion gap: 8 (ref 5–15)
BUN: 30 mg/dL — ABNORMAL HIGH (ref 6–20)
CO2: 28 mmol/L (ref 22–32)
Calcium: 8.8 mg/dL — ABNORMAL LOW (ref 8.9–10.3)
Chloride: 129 mmol/L — ABNORMAL HIGH (ref 98–111)
Creatinine, Ser: 1.67 mg/dL — ABNORMAL HIGH (ref 0.61–1.24)
GFR calc Af Amer: 55 mL/min — ABNORMAL LOW (ref >60)
GFR calc non Af Amer: 48 mL/min — ABNORMAL LOW (ref >60)
Glucose, Bld: 74 mg/dL (ref 70–99)
Potassium: 3.6 mmol/L (ref 3.5–5.1)
Sodium: 165 mmol/L (ref 135–145)

## 2018-10-01 LAB — GLUCOSE, CAPILLARY
Glucose-Capillary: 125 mg/dL — ABNORMAL HIGH (ref 70–99)
Glucose-Capillary: 66 mg/dL — ABNORMAL LOW (ref 70–99)
Glucose-Capillary: 95 mg/dL (ref 70–99)
Glucose-Capillary: 136 mg/dL — ABNORMAL HIGH (ref 70–99)
Glucose-Capillary: 255 mg/dL — ABNORMAL HIGH (ref 70–99)
Glucose-Capillary: 211 mg/dL — ABNORMAL HIGH (ref 70–99)

## 2018-10-01 LAB — CBC
HCT: 36 % — ABNORMAL LOW (ref 39.0–52.0)
Hemoglobin: 10.6 g/dL — ABNORMAL LOW (ref 13.0–17.0)
MCH: 31.4 pg (ref 26.0–34.0)
MCHC: 29.4 g/dL — ABNORMAL LOW (ref 30.0–36.0)
MCV: 106.5 fL — ABNORMAL HIGH (ref 80.0–100.0)
Platelets: 139 10*3/uL — ABNORMAL LOW (ref 150–400)
RBC: 3.38 MIL/uL — ABNORMAL LOW (ref 4.22–5.81)
RDW: 14.4 % (ref 11.5–15.5)
WBC: 7.6 10*3/uL (ref 4.0–10.5)
nRBC: 0 % (ref 0.0–0.2)

## 2018-10-01 LAB — URINE CULTURE: Culture: NO GROWTH

## 2018-10-01 LAB — SODIUM
Sodium: 163 mmol/L (ref 135–145)
Sodium: 162 mmol/L (ref 135–145)
Sodium: 159 mmol/L — ABNORMAL HIGH (ref 135–145)

## 2018-10-01 MED ORDER — INSULIN ASPART 100 UNIT/ML ~~LOC~~ SOLN
0.0000 [IU] | SUBCUTANEOUS | Status: DC
  Administered 2018-10-01: 8 [IU] via SUBCUTANEOUS
  Administered 2018-10-01: 5 [IU] via SUBCUTANEOUS
  Administered 2018-10-02: 3 [IU] via SUBCUTANEOUS
  Administered 2018-10-02: 12:00:00 2 [IU] via SUBCUTANEOUS
  Administered 2018-10-02: 04:00:00 3 [IU] via SUBCUTANEOUS
  Administered 2018-10-03: 05:00:00 5 [IU] via SUBCUTANEOUS
  Administered 2018-10-03 (×2): 3 [IU] via SUBCUTANEOUS
  Administered 2018-10-03 – 2018-10-04 (×3): 2 [IU] via SUBCUTANEOUS
  Administered 2018-10-04: 3 [IU] via SUBCUTANEOUS
  Administered 2018-10-04: 5 [IU] via SUBCUTANEOUS
  Administered 2018-10-04: 2 [IU] via SUBCUTANEOUS
  Administered 2018-10-04 – 2018-10-05 (×3): 3 [IU] via SUBCUTANEOUS
  Administered 2018-10-05 (×2): 2 [IU] via SUBCUTANEOUS

## 2018-10-01 MED ORDER — ORAL CARE MOUTH RINSE
15.0000 mL | Freq: Two times a day (BID) | OROMUCOSAL | Status: DC
  Administered 2018-10-01 – 2018-10-02 (×3): 15 mL via OROMUCOSAL

## 2018-10-01 MED ORDER — CHLORHEXIDINE GLUCONATE 0.12 % MT SOLN
15.0000 mL | Freq: Two times a day (BID) | OROMUCOSAL | Status: DC
  Administered 2018-10-01 – 2018-10-02 (×2): 15 mL via OROMUCOSAL
  Filled 2018-10-01 (×2): qty 15

## 2018-10-01 MED ORDER — INSULIN ASPART 100 UNIT/ML ~~LOC~~ SOLN
4.0000 [IU] | Freq: Four times a day (QID) | SUBCUTANEOUS | Status: DC
  Administered 2018-10-01 (×2): 4 [IU] via SUBCUTANEOUS

## 2018-10-01 NOTE — Procedures (Signed)
Extubation Procedure Note  Patient Details:   Name: Johnathan Archer DOB: 04/15/1971 MRN: [id number]   Airway Documentation:    Vent end date: 10/01/18 Vent end time: 1242   Evaluation  O2 sats: stable throughout Complications: No apparent complications Patient did tolerate procedure well. Bilateral Breath Sounds: Diminished   Yes  Peggye Form 10/01/2018, 12:43 PM

## 2018-10-01 NOTE — Progress Notes (Signed)
NAME:  Johnathan Archer, MRN:  [id number], DOB:  1970/07/22, LOS: 4 ADMISSION DATE:  Oct 12, 2018, CONSULTATION DATE:  10/12/18 REFERRING MD:  Dr. Loni Beckwith, CHIEF COMPLAINT:  CVA  Brief History   48 yoM, LSW 1000 am, presenting with left sided paralysis and right gaze found to have acute right MCA M1 occlusion.  Difficult intubation for airway protection.  Taken for EVR with successful revascularization.    History of present illness   HPI obtained from medical chart review as patient is intubated/ sedated   48 year old male with history of Afib on xarelto, chronic systolic / dystolic HF, DMT2, HTN, MR, NICM, anemia, obesity, and OSA, last seen well around 1000 by coworkers found wedged between sink and bathroom stall at work.  Presented as code stroke with left sided paralysis and right gaze.  Hypertensive at 221/ 145.  Found on CT to have acute right MCA M1 occlusion.  Intubated for airway protection, was a difficult intubation.  Taken to IR with successful revascularization of right MCA M1 occlusion achieving TICI 2b revascularization.  Returns to ICU, PCCM to consult for ventilator management.   Past Medical History  Afib on xarelto, chronic systolic / dystolic HF, DMT2, HTN, MR, NICM, anemia, obesity, OSA, ED   Significant Hospital Events   4/9 Admitted / EVR 4/10 Extubated, started on 3% saline 4/11 Remains on Cleviprex, Failed swallowing evaluation 4/10, left n.p.o. with gastric tube in place, fevers 4/12 Intubated early am for airway compromise/ repeat MRI, rising Na, 3% held, pan-cultured and started on abx   Consults:   Procedures:  4/9 ETT >>4/10; 4/12 >> 4/9 R radial aline >> 4/9 cerebral angiogram   Significant Diagnostic Tests:  4/9 CTH >> 1. Hyperdense right MCA compatible with acute embolus. Early infarct in the right basal ganglia and insula 2. ASPECTS is 8  4/9 CTA head/ neck >> Acute occlusion of the terminal right internal carotid artery with large amount  of clot extending into the right MCA M1 and M2 branches. Poor flow in right MCA branches. Small amount of clot in right A1 segment. Findings compatible with acute embolus. No significant atherosclerotic disease in the neck or head.  4/9 CTH post intervention >> Hyperdensity within the right basal ganglia may indicate contrast staining or petechial hemorrhage. No significant mass effect.  4/10 MRI brain >> evolving right MCA M1 stroke, culprit vessel is patent.  Some petechial changes without overt hemorrhagic conversion, no shift  Echocardiogram 4/10 >> normal LV function 60-65%, mildly increased LV wall thickness without diastolic dysfunction, normal RV function, no evidence of shunt by bubble study,  4/12 MRI Brain >> continued interval evolution large right MCA CVA, scattered petechial hemorrhage slightly increased, increased gyral swelling with edema and regional mass-effect.  Right to left shift increased from 4 mm to 6 mm.  No hydrocephalus.  A few new subcentimeter acute left cerebral infarcts  Micro Data:  4/9 MRSA PCR >> 4/12 BC x 2 >> 4/12 UC >> 4/12 trach asp >>  Antimicrobials:  4/9 ancef preop  Interim history/subjective:  Na 165- off hypertonic saline since 4/12 Slowly increasing sCr, UOP remains adequate  Weaning 10/5 Following command on Right   Objective   Blood pressure 135/84, pulse 73, temperature 97.9 F (36.6 C), temperature source Axillary, resp. rate 17, height 6' (1.829 m), weight 97 kg, SpO2 100 %.    Vent Mode: PRVC FiO2 (%):  [40 %] 40 % Set Rate:  [20 bmp] 20 bmp Vt Set:  [  620 mL] 620 mL PEEP:  [5 cmH20] 5 cmH20 Plateau Pressure:  [11 cmH20-21 cmH20] 18 cmH20   Intake/Output Summary (Last 24 hours) at 10/01/2018 1021 Last data filed at 10/01/2018 0400 Gross per 24 hour  Intake 667.35 ml  Output 1875 ml  Net -1207.65 ml   Filed Weights   10/09/18 1320 09/29/18 0500 09/30/18 0630  Weight: 105 kg 97.6 kg 97 kg   Examination: General:   Critically ill adult male in NAD on vent  HEENT: MM pink/moist, right nare cortrak, pupils disconjugate/ slight upward, reactive Neuro: does not open eyes, but f/c on right side, dense left hemiplegia CV: RR, no mumur PULM: even/non-labored, lungs bilaterally coarse, diminished right base ZO:XWRU, non-tender, bs active  Extremities: warm/dry, no edema  Skin: no rashes   Resolved Hospital Problem list    Assessment & Plan:  Right MCA M1 occlusion s/p EVR with successful revascularization P:  Plans per stroke team and interventional radiology, suspect the degree of his neurological injury impacting airway protection. Goal Na 150-155, continue Na q 6 hr Hypertonic saline held 4/12, ongoing free water 300 ml q 3hr D5W at 25 ml/hr SBP goal < 180  Acute respiratory failure, impaired airway protection Difficult airway OSA - CXR yest w/ evolving RLL infiltrate, high ETT P:  Continue full MV support Tolerating PSV, minimal secretions, may be able to extubate later Consider CXR in am if remains intubated VAP measures  NPO will need SLP post extubation  Dysphasia Continue tube feeding SLP follow-up if/when he is extubated again  Acute renal failure, consider hypertensive, contrast media.   P:  Slowing rising sCr, good UOP still Continue to follow BMP, urine output  Likely RLL aspiration  Fever- improving  P:  Tylenol PRN Continue unasyn  Follow culture data   Afib on xarelto P:  Xarelto on hold, defer to neurology as to when this can be restarted in light of the petechial changes on his MRI brain 4/10 and 4/12 Amiodarone restarted 4/11  Chronic systolic/ diastolic HF - 2/20 EF 55%, normal RV, mod MR, mild dilation of aortic root.  Echocardiogram 4/11 with improved LV function and no evidence of impaired LV relaxation as above P:  Restarted home carvedilol at lower dose 4/11, Entresto on 4/12  Hypertension, UDS negative P:  SBP goals < 180 Continue coreg 25 mg BID,  ASA, hydralazine  BID, entresto, spironolactone  Holding lasix given Na Prn labetalol/ apresoline  DMT2 P:  Sliding-scale insulin - resistant scale decrease to mod given episode of hypoglycemia lantus 15 units daily  Home metformin and Tresiba insulin are on hold  Hx normocytic anemia P:  Follow CBC  Best practice:  Diet: NPO, TF Pain/Anxiety/Delirium protocol (if indicated): fent, versed prn VAP protocol (if indicated): 4/11 DVT prophylaxis: SCDs GI prophylaxis: PPI Glucose control: SSI resistant  Mobility: BR Code Status: Full Family Communication: Dr Lucinda Dell discussed with wife 4/11 Disposition: ICU  Labs   CBC: Recent Labs  Lab 10/09/18 1248  09/28/18 0224  09/29/18 0320 09/30/18 0323 09/30/18 0428 09/30/18 0624 10/01/18 0712  WBC 7.3  --  9.7  --  7.3  --  8.2  --  7.6  NEUTROABS 5.8  --  8.1*  --   --   --   --   --   --   HGB 12.5*   < > 11.9*   < > 11.2* 11.6* 12.0* 11.2* 10.6*  HCT 38.7*   < > 37.0*   < > 36.8*  34.0* 41.6 33.0* 36.0*  MCV 97.7  --  97.1  --  101.7*  --  105.3*  --  106.5*  PLT 241  --  187  --  176  --  149*  --  139*   < > = values in this interval not displayed.    Basic Metabolic Panel: Recent Labs  Lab 10/10/2018 1248 10/13/2018 1252  09/28/18 0224  09/29/18 0320  09/30/18 0323 09/30/18 0428 09/30/18 6203 09/30/18 1229 09/30/18 1810 10/01/18 0053 10/01/18 0712  NA 134*  --    < > 139   < > 154*   < > 164* 163* 167* 165* 155* 163* 165*  K 4.6  --    < > 3.9   < > 3.9  --  3.8 4.0 3.5  --   --   --  3.6  CL 100  --   --  107  --  122*  --   --  129*  --   --   --   --  129*  CO2 21*  --   --  20*  --  22  --   --  25  --   --   --   --  28  GLUCOSE 434*  --   --  186*  --  186*  --   --  298*  --   --   --   --  74  BUN 25*  --   --  25*  --  25*  --   --  23*  --   --   --   --  30*  CREATININE 1.42* 1.30*  --  1.51*  --  1.53*  --   --  1.57*  --   --   --   --  1.67*  CALCIUM 9.2  --   --  8.4*  --  8.3*  --   --   8.7*  --   --   --   --  8.8*  MG  --   --   --  1.8  --   --   --   --   --   --   --   --   --   --   PHOS  --   --   --  3.3  --   --   --   --   --   --   --   --   --   --    < > = values in this interval not displayed.   GFR: Estimated Creatinine Clearance: 65.3 mL/min (A) (by C-G formula based on SCr of 1.67 mg/dL (H)). Recent Labs  Lab 09/28/18 0224 09/29/18 0320 09/30/18 0428 10/01/18 0712  WBC 9.7 7.3 8.2 7.6    Liver Function Tests: Recent Labs  Lab 10/16/2018 1248  AST 19  ALT 22  ALKPHOS 50  BILITOT 1.7*  PROT 7.3  ALBUMIN 3.3*   No results for input(s): LIPASE, AMYLASE in the last 168 hours. No results for input(s): AMMONIA in the last 168 hours.  ABG    Component Value Date/Time   PHART 7.381 09/30/2018 0624   PCO2ART 39.0 09/30/2018 0624   PO2ART 125.0 (H) 09/30/2018 0624   HCO3 22.8 09/30/2018 0624   TCO2 24 09/30/2018 0624   ACIDBASEDEF 2.0 09/30/2018 0624   O2SAT 99.0 09/30/2018 0624     Coagulation Profile: Recent Labs  Lab 09/30/2018 1248  INR  2.6*    Cardiac Enzymes: No results for input(s): CKTOTAL, CKMB, CKMBINDEX, TROPONINI in the last 168 hours.  HbA1C: Hgb A1c MFr Bld  Date/Time Value Ref Range Status  09/28/2018 02:24 AM 8.9 (H) 4.8 - 5.6 % Final    Comment:    (NOTE) Pre diabetes:          5.7%-6.4% Diabetes:              >6.4% Glycemic control for   <7.0% adults with diabetes   03/11/2017 07:41 PM 8.0 (H) 4.8 - 5.6 % Final    Comment:    (NOTE) Pre diabetes:          5.7%-6.4% Diabetes:              >6.4% Glycemic control for   <7.0% adults with diabetes     CBG: Recent Labs  Lab 09/30/18 1923 09/30/18 2309 10/01/18 0311 10/01/18 0800 10/01/18 0949  GLUCAP 284* 273* 125* 66* 95    CCT time 35 mins  Posey Boyer, MSN, AGACNP-BC [facility] Pgr: [phone] or if no answer [phone] 10/01/2018, 10:21 AM

## 2018-10-01 NOTE — Progress Notes (Signed)
STROKE TEAM PROGRESS NOTE   INTERVAL HISTORY Patient remains intubated but is not sedated and weaning well on the ventilator.  Is awake and interactive and follows commands well on the right side consistently.  Blood pressure is adequately controlled.  He still has low-grade fever 100.5.  Vitals:   10/01/18 0400 10/01/18 0500 10/01/18 0600 10/01/18 0756  BP: 116/71 133/81 135/84   Pulse: 74 72 76 73  Resp: Temp: (!) 100.5 F (38.1 C)   97.9 F (36.6 C)  TempSrc: Oral   Axillary  SpO2: 100% 100% 100% 100%  Weight:      Height:        CBC:  Recent Labs  Lab 09/29/18 1248  09/28/18 0224  09/30/18 0428 09/30/18 0624 10/01/18 0712  WBC 7.3  --  9.7   < > 8.2  --  7.6  NEUTROABS 5.8  --  8.1*  --   --   --   --   HGB 12.5*   < > 11.9*   < > 12.0* 11.2* 10.6*  HCT 38.7*   < > 37.0*   < > 41.6 33.0* 36.0*  MCV 97.7  --  97.1   < > 105.3*  --  106.5*  PLT 241  --  187   < > 149*  --  139*   < > = values in this interval not displayed.    Basic Metabolic Panel:  Recent Labs  Lab 09/28/18 0224  09/30/18 0428 09/30/18 0624  10/01/18 0053 10/01/18 0712  NA 139   < > 163* 167*   < > 163* 165*  K 3.9   < > 4.0 3.5  --   --  3.6  CL 107   < > 129*  --   --   --  129*  CO2 20*   < > 25  --   --   --  28  GLUCOSE 186*   < > 298*  --   --   --  74  BUN 25*   < > 23*  --   --   --  30*  CREATININE 1.51*   < > 1.57*  --   --   --  1.67*  CALCIUM 8.4*   < > 8.7*  --   --   --  8.8*  MG 1.8  --   --   --   --   --   --   PHOS 3.3  --   --   --   --   --   --    < > = values in this interval not displayed.    IMAGING past 24h No results found.   PHYSICAL EXAM   General -obese middle-aged African-American male who is intubated but not on sedation. Ophthalmologic - fundi not visualized due to noncooperation.  Cardiovascular - Regular rate and rhythm, not in afib.  Neuro - intubated off sedation, drowsy but can be easily aroused. able to following simple commands  on the right. , eyes disconjugate, with left eye upward and right eye downward position, pupil bilateral 2.5 mm, sluggish to light. No blinking to visual threat bilaterally, right gaze with sustained nystagmus, left gaze palsy. Corneal reflex weak on the left but present on the right, gag and cough present.  Bilateral facial weakness. Tongue midline in mouth. Spontaneous moving RUE and RLE well against gravity, RUE 3-/5, RLE 2/5. LUE 0/5 and LLE slight withdraw on  pain stimulation. DTR 1+ and left babinski. Sensation and coordination not cooperative and gait not tested.   ASSESSMENT/PLAN Johnathan Archer is a 48 y.o. male with history of HTN, DB, CHF and AF on Xarelto found down at work, presenting with L hemiparesis and R gaze.   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 Early infarct R BG and insula.    CTA head & neck ELO R ICA, R M1, R M2. R A1 clot.   Cerebral angio R M1 TICI2b reperfusion  CT head hyperdensity R BG, contrast vs hmg. No mass effect  MRI  Large R MCA infarct, associated edema and mass effect w/o shift. prob small R cerebellar infarct. Small vessel disease.   MRA  Normal, previous R M1 open  2D Echo EF 60-65%. No source of embolus   LDL 73  HgbA1c 8.9  Heparin 5000 units sq tid or VTE prophylaxis Xarelto (rivaroxaban) daily prior to admission, now on No antithrombotic.  On ASA 325, consider resume DOAC in 5-7 days post stroke.   Therapy recommendations: CIR  Disposition:  pending   Cerebral edema - Right cerebral peduncle compression Induced hypernatremia  09/28/2018 pt with right ptosis, right pupil dilation with reserved reflex, right gaze sustained nystagmus -> 09/29/2018 bilateral proptosis, minimal extraocular movement bilaterally, pupil equal sluggish to light, bilateral facial weakness, dysphagia  MRI right temporal  pole edema with cerebral peduncle compression  CT repeat 09/29/2018- Increasing regional mass effect and edema with associated 4 mm right-to-left shift.   MRI repeat increased MLS to 68mm, brainstem compression stable  Na 165  Goal Na 150-155  Check Na q6h  On free water 30 q 3h and D5 for 24h - continue for now, re-evaluate  Acute Respiratory Failure OSA  Initial intubation in ED for IR  Extubated 4/10  reintubated 4/11 d/t increased WOB  ? Trach  Trial extubation  Consider nocturnal CPAP if extubated  CCM onboard  Atrial Fibrillation w/ RVR  Home anticoagulation:  Xarelto (rivaroxaban) daily  . Home meds: amiodarone 200 . Rate controlled now . On ASA 325  Consider resume DOAC in 5-7 days post stroke.   Chronic combined systolic and diastolic CHF  Home meds: Coreg 37.5 bid, lasix 60 bid, hydralazine 50 bid, isosorbide 30, entresto bid, spironolactone 25  CXR 10-02-2018 lungs clear  resumed home meds - Coreg 25 bid, lasix 60 bid, hydralazine 50 bid, isosorbide 30, entresto bid, spironolactone 25  Fever   Tmax 101.9  Blood culture no growth so far   urine culture no growth so far   resp culture reincubated  CCM on board  Continue hydration  CXR showed right basal small pleural effusion  Unasyn 4/12>>  Hypertension  Home meds:  Coreg 37.5 bid, lasix 60 bid, hydralazine 50 bid, isosorbide 30, entresto bid, spironolactone 25  BP goal < 180  Off cleviprex  On Coreg 25 bid, lasix 60 bid, hydralazine 50 bid, isosorbide 30, entresto bid, spironolactone 25 . Long-term BP goal normotensive  Hyperlipidemia  Home meds:  No statin  LDL 73, goal < 70  Add lipitor 20  Continue statin at discharge  Diabetes type II uncontrolled  Home meds:  Metformin 500 bid, insulin degludec 8u  HgbA1c 8.9, goal < 7.0  Hyperglycemia  CCM on board  On Lantus, novolog 0-15 q4h  Hold off metformin  due to elevated Cre  Add novolog 4u qid DB  RN  CBGs  SSI  Other Stroke Risk Factors  Obesity, Body mass index is 29 kg/m., recommend weight loss, diet and exercise as appropriate   Obstructive sleep apnea  Nonischemic CM  Other Active Problems  AKI on CKD stage II Cr 1.67  Normocytic Anemia 12.5->10.6, likely dilution. MCV 106.5  Thrombocytopenia 241->139  Hospital day # 4  I have personally obtained history,examined this patient, reviewed notes, independently viewed imaging studies, participated in medical decision making and plan of care.ROS completed by me personally and pertinent positives fully documented  I have made any additions or clarifications directly to the above note. Agree with note above.  The patient seems to be neurologically stable and is breathing well spontaneously.  It may be worthwhile giving him a second trial of extubation.  Discussed with critical care MD Dr Vassie Loll , patient as well as with his wife and daughter via video call and explained prognosis and plan of care and answered questions. This patient is critically ill and at significant risk of neurological worsening, death and care requires constant monitoring of vital signs, hemodynamics,respiratory and cardiac monitoring, extensive review of multiple databases, frequent neurological assessment, discussion with family, other specialists and medical decision making of high complexity.I have made any additions or clarifications directly to the above note.This critical care time does not reflect procedure time, or teaching time or supervisory time of PA/NP/Med Resident etc but could involve care discussion time.  I spent 35 minutes of neurocritical care time  in the care of  this patient.     Delia Heady, MD Medical Director [facility] Stroke Center Pager: [phone] 10/01/2018 1:20 PM   To contact Stroke Continuity provider, please refer to [email or web address]. After hours, contact General Neurology

## 2018-10-01 NOTE — Progress Notes (Signed)
Assisted tele visit to patient with family  

## 2018-10-01 NOTE — Progress Notes (Addendum)
Inpatient Diabetes Program Recommendations  AACE/ADA: New Consensus Statement on Inpatient Glycemic Control   Target Ranges:  Prepandial:   less than 140 mg/dL      Peak postprandial:   less than 180 mg/dL (1-2 hours)      Critically ill patients:  140 - 180 mg/dL   Results for EILERT, OCHELTREE (MRN [id number]) as of 10/01/2018 10:22  Ref. Range 09/30/2018 08:01 09/30/2018 11:30 09/30/2018 15:43 09/30/2018 19:23 09/30/2018 23:09 10/01/2018 03:11 10/01/2018 08:00 10/01/2018 09:49  Glucose-Capillary Latest Ref Range: 70 - 99 mg/dL 800 (H) 634 (H) 949 (H) 284 (H) 273 (H) 125 (H) 66 (L) 95   Review of Glycemic Control  Diabetes history: DM2 Outpatient Diabetes medications: Tresiba 8 mg daily, Metformin 750 mg BID Current orders for Inpatient glycemic control: Lantus 15 units QHS, Novolog 0-20 units Q4H; Glucerna 237 ml QID (10:00, 14:00, 18:00, 22:00)  Inpatient Diabetes Program Recommendations:   Correction (SSI): Please consider decreasing Novolog correction to Novolog 0-15 units Q4H.  Insulin - Meal Coverage: Please consider ordering Novolog 4 units QID for tube feeding coverage (scheduled at same timing as Glucerna 237 ml QID; 10:00, 14:00, 18:00, 22:00).   Thanks, Orlando Penner, RN, MSN, CDE Diabetes Coordinator Inpatient Diabetes Program [phone] (Team Pager from 8am to 5pm)

## 2018-10-01 NOTE — Progress Notes (Signed)
SLP Cancellation Note  Patient Details Name: Johnathan Archer MRN: [id number] DOB: [birth year]   Cancelled treatment:       Reason Eval/Treat Not Completed: Medical issues which prohibited therapy;Patient not medically ready; reintubated 4/12; potential for extubation today.  SLP will follow for readiness.   Blenda Mounts Laurice 10/01/2018, 11:49 AM

## 2018-10-01 NOTE — Progress Notes (Signed)
Assisted tele visit to patient with wife.  [phone]   Melrose Nakayama, RN

## 2018-10-01 NOTE — Plan of Care (Signed)
Pt has cortrak and receives bolus tube feedings at goal. SLP evaluation orders.

## 2018-10-01 NOTE — Progress Notes (Signed)
Physical Therapy Treatment Patient Details Name: Johnathan Archer MRN: [id number] DOB: 05-Apr-1971 Today's Date: 10/01/2018    History of Present Illness 48 year old male with history of Afib, chronic systolic/dystolic HF, DMT2, HTN, MR, NICM, anemia, obesity, and OSA, found down by coworkers wedged between sink and bathroom stall at work.  Presented as code stroke with left sided paralysis and right gaze.  Hypertensive at 221/ 145.  Found on CT to have acute right MCA M1 occlusion. Pt Intubated for airway protection. Taken to IR with successful revascularization of right MCA M1 occlusion. Pt extubated 4/10. Reintubated 4/12-4/13.    PT Comments    Patient very lethargic this session limiting tolerance and participation. S/p extubation earlier in AM prior to session. Prefers to keep eyes closed during session but able to open to command for short periods of time esp when sitting EOB. Worked on sitting EOB and reaching outside boS to activate core/trunk as well as posture. Pt with left gaze preference but able to look right with cues but not sustain. No active movement noted in LUE/LLE. VSS throughout session. Pt with gargling noises noted post session; cues provided to cough. Will continue to follow and progress as tolerated.   Follow Up Recommendations  CIR;Supervision/Assistance - 24 hour     Equipment Recommendations  Other (comment)(TBA)    Recommendations for Other Services       Precautions / Restrictions Precautions Precautions: Fall Restrictions Weight Bearing Restrictions: No    Mobility  Bed Mobility Overal bed mobility: Needs Assistance Bed Mobility: Supine to Sit     Supine to sit: +2 for physical assistance;HOB elevated;Max assist Sit to supine: Max assist;+2 for physical assistance;HOB elevated   General bed mobility comments: Requires Max A to get to EOB due to decreased arousal. Able to initiate trunk down into bed but assist needed with LEs to return to  supine.   Transfers                 General transfer comment: Not attempted due to decreased arousal.  Ambulation/Gait                 Stairs             Wheelchair Mobility    Modified Rankin (Stroke Patients Only) Modified Rankin (Stroke Patients Only) Pre-Morbid Rankin Score: No symptoms Modified Rankin: Severe disability     Balance Overall balance assessment: Needs assistance Sitting-balance support: Feet supported;Single extremity supported Sitting balance-Leahy Scale: Poor Sitting balance - Comments: sat EOB for approx 10 min working on activating trunk and upright posture.  Cues to reach outside BoS towards right to activate lateral flexors and for neck positioning/gaze. Able to initiate some movements with max encouragement but needs constant stimulus to stay alert                                    Cognition Arousal/Alertness: Lethargic Behavior During Therapy: Flat affect Overall Cognitive Status: No family/caregiver present to determine baseline cognitive functioning Area of Impairment: Attention;Following commands                   Current Attention Level: Focused   Following Commands: Follows one step commands with increased time       General Comments: Pt with minimal verbalizations during session requiring max encouragement. Eyes closed for most of session, opens to command for short periods of time. Very sleepy  throughout. Left gaze preference but able to gaze right with cues.       Exercises      General Comments General comments (skin integrity, edema, etc.): VSS throughout. Pt with snoring like breathing sounds post session; gargling noted as well, attempting to get pt to cough.       Pertinent Vitals/Pain Pain Assessment: Faces Faces Pain Scale: No hurt    Home Living                      Prior Function            PT Goals (current goals can now be found in the care plan section)  Progress towards PT goals: Progressing toward goals(slowly)    Frequency    Min 4X/week      PT Plan Current plan remains appropriate    Co-evaluation PT/OT/SLP Co-Evaluation/Treatment: Yes Reason for Co-Treatment: To address functional/ADL transfers;For patient/therapist safety;Complexity of the patient's impairments (multi-system involvement) PT goals addressed during session: Mobility/safety with mobility;Balance        AM-PAC PT "6 Clicks" Mobility   Outcome Measure  Help needed turning from your back to your side while in a flat bed without using bedrails?: Total Help needed moving from lying on your back to sitting on the side of a flat bed without using bedrails?: Total Help needed moving to and from a bed to a chair (including a wheelchair)?: Total Help needed standing up from a chair using your arms (e.g., wheelchair or bedside chair)?: Total Help needed to walk in hospital room?: Total Help needed climbing 3-5 steps with a railing? : Total 6 Click Score: 6    End of Session Equipment Utilized During Treatment: Oxygen Activity Tolerance: Patient limited by lethargy Patient left: in bed;with call bell/phone within reach;with bed alarm set Nurse Communication: Mobility status PT Visit Diagnosis: Other abnormalities of gait and mobility (R26.89);Hemiplegia and hemiparesis Hemiplegia - Right/Left: Left Hemiplegia - dominant/non-dominant: Non-dominant Hemiplegia - caused by: Cerebral infarction     Time: 9373-4287 PT Time Calculation (min) (ACUTE ONLY): 25 min  Charges:  $Neuromuscular Re-education: 8-22 mins                     [name], PT, DPT Acute Rehabilitation Services Pager [phone] Office [phone]       Blake Divine A Lanier Ensign 10/01/2018, 4:29 PM

## 2018-10-01 NOTE — Progress Notes (Signed)
Nutrition Follow-up  DOCUMENTATION CODES:   Not applicable  INTERVENTION:   Continue  1 container of Glucerna 1.5 QID via Cortrak  30 ml Prostat TID  Provides: 1724 kcal, 123 grams protein, and 720 ml free water.  Total free water: 3120 ml   NUTRITION DIAGNOSIS:   Inadequate oral intake related to inability to eat as evidenced by NPO status. Ongoing  GOAL:   Patient will meet greater than or equal to 90% of their needs Met.   MONITOR:   TF tolerance, Diet advancement  REASON FOR ASSESSMENT:   Consult Enteral/tube feeding initiation and management  ASSESSMENT:   Pt with PMH of Afib on xarelto, chronic systolic/dystolic HF, DM, HTN, MR, anemia, obesity admitted with R MCA s/p IR for revascularization.    4/10 Cortrak placed 4/11 intubated 4/13 extubated   Pt discussed during ICU rounds and with RN.   Per RN pt with low blood sugar this am after correction. Spoke with DM coordinator who will discuss TF coverage for bolus feedings  Medications reviewed and include: 3% hypertonic saline 300 ml free water every 3 hours = 2400 ml  Moderate sliding scale  15 units Lantus HS Cleviprex off Labs reviewed: Na 162 (H) decreasing with free water Lab Results  Component Value Date   HGBA1C 8.9 (H) 09/28/2018     NUTRITION - FOCUSED PHYSICAL EXAM:  Deferred   Diet Order:   Diet Order            Diet NPO time specified  Diet effective now              EDUCATION NEEDS:   Not appropriate for education at this time  Skin:  Skin Assessment: (R groin incision)  Last BM:  unknown  Height:   Ht Readings from Last 1 Encounters:  09/30/18 6' (1.829 m)    Weight:   Wt Readings from Last 1 Encounters:  09/30/18 97 kg    Ideal Body Weight:     BMI:  Body mass index is 29 kg/m.  Estimated Nutritional Needs:   Kcal:  1700-1900  Protein:  95-110 grams  Fluid:  >1.7 L/day  Maylon Peppers RD, LDN, CNSC [phone] Pager [phone] After Hours Pager

## 2018-10-01 NOTE — Progress Notes (Signed)
Occupational Therapy Treatment Patient Details Name: Johnathan Archer MRN: [id number] DOB: 06-25-1970 Today's Date: 10/01/2018    History of present illness 48 year old male with history of Afib, chronic systolic/dystolic HF, DMT2, HTN, MR, NICM, anemia, obesity, and OSA, found down by coworkers wedged between sink and bathroom stall at work.  Presented as code stroke with left sided paralysis and right gaze.  Hypertensive at 221/ 145.  Found on CT to have acute right MCA M1 occlusion. Pt Intubated for airway protection. Taken to IR with successful revascularization of right MCA M1 occlusion. Pt extubated 4/10. Reintubated 4/12-4/13.   OT comments  Pt seen in conjunction with PT.  Pt very lethargic this pm.  Worked on EOB sitting balance.  He required max A, overall, but was able to maintain balance foe brief periods with min A.  Attempted to work on reaching to improve weight shift, however, pt only engaged in this a few times before indicating he was fatigued and refusing further  Activity. No active movement of Lt UE noted.   Assisted back to bed.    Follow Up Recommendations  CIR;Supervision/Assistance - 24 hour    Equipment Recommendations  Other (comment)    Recommendations for Other Services      Precautions / Restrictions Precautions Precautions: Fall Restrictions Weight Bearing Restrictions: No       Mobility Bed Mobility Overal bed mobility: Needs Assistance Bed Mobility: Supine to Sit     Supine to sit: +2 for physical assistance;HOB elevated;Max assist Sit to supine: Max assist;+2 for physical assistance;HOB elevated   General bed mobility comments: Requires Max A to get to EOB due to decreased arousal. Able to initiate trunk down into bed but assist needed with LEs to return to supine.   Transfers                 General transfer comment: Not attempted due to decreased arousal.    Balance Overall balance assessment: Needs  assistance Sitting-balance support: Feet supported;Single extremity supported Sitting balance-Leahy Scale: Poor Sitting balance - Comments: sat EOB for approx 10 min working on activating trunk and upright posture.  Cues to reach outside BoS towards right to activate lateral flexors and for neck positioning/gaze. Able to initiate some movements with max encouragement but needs constant stimulus to stay alert                                   ADL either performed or assessed with clinical judgement   ADL Overall ADL's : Needs assistance/impaired     Grooming: Wash/dry hands;Wash/dry face;Maximal assistance;Sitting                                       Vision   Additional Comments: Pt with Rt gaze preference, but will track to the Rt to locate therapist and performed reaching activities to the Rt    Perception     Praxis      Cognition Arousal/Alertness: Lethargic Behavior During Therapy: Flat affect Overall Cognitive Status: Impaired/Different from baseline Area of Impairment: Attention;Following commands                   Current Attention Level: Focused   Following Commands: Follows one step commands inconsistently;Follows one step commands with increased time       General Comments: Pt  with minimal verbalizations during session requiring max encouragement. Eyes closed for most of session, opens to command for short periods of time. Very sleepy throughout. Left gaze preference but able to gaze right with cues.         Exercises Exercises: Other exercises   Shoulder Instructions       General Comments VSS throughout.  Pt with snoring respirations at end of session     Pertinent Vitals/ Pain       Pain Assessment: Faces Faces Pain Scale: No hurt  Home Living                                          Prior Functioning/Environment              Frequency  Min 3X/week        Progress Toward  Goals  OT Goals(current goals can now be found in the care plan section)  Progress towards OT goals: Not progressing toward goals - comment(due to lethargy )     Plan Discharge plan remains appropriate    Co-evaluation    PT/OT/SLP Co-Evaluation/Treatment: Yes Reason for Co-Treatment: Complexity of the patient's impairments (multi-system involvement);Necessary to address cognition/behavior during functional activity;For patient/therapist safety;To address functional/ADL transfers PT goals addressed during session: Mobility/safety with mobility;Balance OT goals addressed during session: ADL's and self-care;Strengthening/ROM      AM-PAC OT "6 Clicks" Daily Activity     Outcome Measure   Help from another person eating meals?: Total Help from another person taking care of personal grooming?: A Lot Help from another person toileting, which includes using toliet, bedpan, or urinal?: Total Help from another person bathing (including washing, rinsing, drying)?: Total Help from another person to put on and taking off regular upper body clothing?: Total Help from another person to put on and taking off regular lower body clothing?: Total 6 Click Score: 7    End of Session Equipment Utilized During Treatment: Oxygen  OT Visit Diagnosis: Hemiplegia and hemiparesis;Muscle weakness (generalized) (M62.81) Hemiplegia - Right/Left: Left Hemiplegia - dominant/non-dominant: Non-Dominant Hemiplegia - caused by: Cerebral infarction   Activity Tolerance Patient limited by lethargy   Patient Left in bed;with call bell/phone within reach;with bed alarm set   Nurse Communication Mobility status        Time: 6213-0865 OT Time Calculation (min): 25 min  Charges: OT General Charges $OT Visit: 1 Visit OT Treatments $Neuromuscular Re-education: 8-22 mins  Jeani Hawking, OTR/L Acute Rehabilitation Services Pager [phone] Office [phone]    Jeani Hawking M 10/01/2018, 4:50  PM

## 2018-10-02 ENCOUNTER — Inpatient Hospital Stay (HOSPITAL_COMMUNITY): Payer: BLUE CROSS/BLUE SHIELD

## 2018-10-02 DIAGNOSIS — E870 Hyperosmolality and hypernatremia: Secondary | ICD-10-CM | POA: Diagnosis not present

## 2018-10-02 DIAGNOSIS — I10 Essential (primary) hypertension: Secondary | ICD-10-CM | POA: Diagnosis present

## 2018-10-02 DIAGNOSIS — J189 Pneumonia, unspecified organism: Secondary | ICD-10-CM

## 2018-10-02 LAB — BASIC METABOLIC PANEL
Anion gap: 9 (ref 5–15)
BUN: 32 mg/dL — ABNORMAL HIGH (ref 6–20)
CO2: 27 mmol/L (ref 22–32)
Calcium: 8.6 mg/dL — ABNORMAL LOW (ref 8.9–10.3)
Chloride: 124 mmol/L — ABNORMAL HIGH (ref 98–111)
Creatinine, Ser: 1.75 mg/dL — ABNORMAL HIGH (ref 0.61–1.24)
GFR calc Af Amer: 52 mL/min — ABNORMAL LOW (ref >60)
GFR calc non Af Amer: 45 mL/min — ABNORMAL LOW (ref >60)
Glucose, Bld: 204 mg/dL — ABNORMAL HIGH (ref 70–99)
Potassium: 3.8 mmol/L (ref 3.5–5.1)
Sodium: 160 mmol/L — ABNORMAL HIGH (ref 135–145)

## 2018-10-02 LAB — CULTURE, RESPIRATORY

## 2018-10-02 LAB — CBC
HCT: 34.6 % — ABNORMAL LOW (ref 39.0–52.0)
Hemoglobin: 10.2 g/dL — ABNORMAL LOW (ref 13.0–17.0)
MCH: 31.1 pg (ref 26.0–34.0)
MCHC: 29.5 g/dL — ABNORMAL LOW (ref 30.0–36.0)
MCV: 105.5 fL — ABNORMAL HIGH (ref 80.0–100.0)
Platelets: 151 10*3/uL (ref 150–400)
RBC: 3.28 MIL/uL — ABNORMAL LOW (ref 4.22–5.81)
RDW: 14.2 % (ref 11.5–15.5)
WBC: 6.7 10*3/uL (ref 4.0–10.5)
nRBC: 0 % (ref 0.0–0.2)

## 2018-10-02 LAB — BLOOD GAS, ARTERIAL
Acid-Base Excess: 3.4 mmol/L — ABNORMAL HIGH (ref 0.0–2.0)
Bicarbonate: 27.9 mmol/L (ref 20.0–28.0)
Drawn by: 350431
O2 Content: 2 L/min
O2 Saturation: 96.2 %
Patient temperature: 98.1
pCO2 arterial: 45.1 mmHg (ref 32.0–48.0)
pH, Arterial: 7.406 (ref 7.350–7.450)
pO2, Arterial: 84.2 mmHg (ref 83.0–108.0)

## 2018-10-02 LAB — GLUCOSE, CAPILLARY
Glucose-Capillary: 112 mg/dL — ABNORMAL HIGH (ref 70–99)
Glucose-Capillary: 119 mg/dL — ABNORMAL HIGH (ref 70–99)
Glucose-Capillary: 123 mg/dL — ABNORMAL HIGH (ref 70–99)
Glucose-Capillary: 130 mg/dL — ABNORMAL HIGH (ref 70–99)
Glucose-Capillary: 156 mg/dL — ABNORMAL HIGH (ref 70–99)
Glucose-Capillary: 158 mg/dL — ABNORMAL HIGH (ref 70–99)

## 2018-10-02 LAB — PHOSPHORUS: Phosphorus: 3.2 mg/dL (ref 2.5–4.6)

## 2018-10-02 LAB — SODIUM: Sodium: 158 mmol/L — ABNORMAL HIGH (ref 135–145)

## 2018-10-02 LAB — MAGNESIUM: Magnesium: 2.4 mg/dL (ref 1.7–2.4)

## 2018-10-02 LAB — CULTURE, RESPIRATORY W GRAM STAIN: Culture: NORMAL

## 2018-10-02 MED ORDER — SODIUM CHLORIDE 3 % IV SOLN
INTRAVENOUS | Status: DC
  Administered 2018-10-03: 50 mL/h via INTRAVENOUS
  Filled 2018-10-02 (×3): qty 500

## 2018-10-02 MED ORDER — ALBUTEROL SULFATE (2.5 MG/3ML) 0.083% IN NEBU
INHALATION_SOLUTION | RESPIRATORY_TRACT | Status: AC
  Filled 2018-10-02: qty 3

## 2018-10-02 MED ORDER — ALBUTEROL SULFATE (2.5 MG/3ML) 0.083% IN NEBU
2.5000 mg | INHALATION_SOLUTION | Freq: Four times a day (QID) | RESPIRATORY_TRACT | Status: DC
  Administered 2018-10-03 – 2018-10-08 (×22): 2.5 mg via RESPIRATORY_TRACT
  Filled 2018-10-02 (×23): qty 3

## 2018-10-02 MED ORDER — IOHEXOL 350 MG/ML SOLN: 100.0000 mL | Freq: Once | INTRAVENOUS | Status: DC | PRN

## 2018-10-02 MED ORDER — INSULIN ASPART 100 UNIT/ML ~~LOC~~ SOLN
6.0000 [IU] | Freq: Four times a day (QID) | SUBCUTANEOUS | Status: DC
  Administered 2018-10-02 – 2018-10-04 (×8): 6 [IU] via SUBCUTANEOUS

## 2018-10-02 MED ORDER — INSULIN GLARGINE 100 UNIT/ML ~~LOC~~ SOLN
18.0000 [IU] | Freq: Every day | SUBCUTANEOUS | Status: DC
  Administered 2018-10-03: 18 [IU] via SUBCUTANEOUS
  Filled 2018-10-02 (×3): qty 0.18

## 2018-10-02 MED ORDER — IOHEXOL 350 MG/ML SOLN
100.0000 mL | Freq: Once | INTRAVENOUS | Status: AC | PRN
  Administered 2018-10-02: 100 mL via INTRAVENOUS

## 2018-10-02 NOTE — Progress Notes (Signed)
PCCM Brief Interval Progress Note  Called to bedside to evaluate patient for possible intubation.  Patient examined at bedside with Dr. Ardeth Perfect (PCCM) and Dr. Otelia Limes (Neurology) and plan discussed between specialties.   Patient has diminished cough and gag, and is snoring. His mental status is waxing and waning but he is following some commands (RUE, RLE, weakly nods and shakes head, opens mouth when asked.) ABG checked: 7.4/45/84.2/3.4/27.9/96.2   Plan Risks and benefit of intubation discussed between Dr. Ardeth Perfect, Dr. Otelia Limes, myself.  At this time, we will not intubate the patient prior to going to CT Head.  It is felt that the risks of intubation (with prior failed extubation) outweigh the benefit of intubation given appropriate ABG, weakly intact airway protection and wax/waning mental status.  Given prior failed extubations, it is thought that if patient is intubated, liberation is unlikely.  PCCM and Neurology will discuss findings of CT Head when resulted.    Critical Care Time: 30 minutes   Tessie Fass MSN, AGACNP-BC [facility] Pulmonary/Critical Care Medicine [phone] If no answer, [phone] 10/02/2018, 10:59 PM

## 2018-10-02 NOTE — Significant Event (Addendum)
Rapid Response Event Note  Overview:Called for help placing STAT orders on patient, RR RN walked to unit to help RA and subsequently assessed pt. Time Called: 2120 Arrival Time: 2122 Event Type: Neurologic, Respiratory  Initial Focused Assessment: Pt laying in bed with eyes closed, tube feeding like secretions oozing from mouth.  Respirations labored with accessory muscle use and snoring. Pt suctioned and pulled up in the bed.  Pt will not open eyes or speak but will squeeze R hand with repeated prompting. L side flaccid(baseline) and pt aphasic. Pupils 2/sluggish. Lungs rhonchus t/o. T-98.1, HR-75 SR, BP-157/79, RR-40, SpO2-91% on 2L [place].  Interventions: STAT albuterol tx given  STAT PCXR-low volumes with cardiomegaly and vascular congestion STAT ABG-7.40/45.1/84.2/27.9 STAT CT head (done after PCCM and [place] cleared to go to CT)   Plan of Care (if not transferred): PCCM to bedside, orders to take pt to CT without intubating.  Pt to CT scan, CT-head-worsening shift and edema.  CTA done and pt immediately dropped SpO2-60s, HR-50s to PEA.  Code blue called. Pt taken to trauma room C for ED assist with intubation.  Pt regained pulse prior to intubation and lost it again right after intubation.  See Code Blue sheet for details.  Pt transferred to 4N26. Event Summary: Name of Physician Notified: [place] at 2105(PTA RRT)    at      Dr.Sommer @2140 , returned call at 2210 Dr. Otelia Limes @ [address] [place]

## 2018-10-02 NOTE — Evaluation (Signed)
Speech Language Pathology Evaluation Patient Details Name: Johnathan Archer MRN: [id number] DOB: [birth year] Today's Date: 10/02/2018 Time: 7408-1448 SLP Time Calculation (min) (ACUTE ONLY): 17 min  Problem List:  Patient Active Problem List   Diagnosis Date Noted  . Ischemic stroke (HCC) 09/30/2018  . Middle cerebral artery embolism, right 10/02/2018  . Hypertensive heart disease with heart failure (HCC) 03/28/2016  . Normocytic anemia 03/15/2016  . Hyperthyroidism 03/15/2016  . Chronic combined systolic and diastolic CHF (congestive heart failure) (HCC)   . History of noncompliance with medical treatment 03/06/2015  . Chest pain, atypical 03/06/2015  . Obesity (BMI 30-39.9) 01/21/2014  . Long term current use of anticoagulant therapy 01/14/2013  . Nonischemic cardiomyopathy (HCC) 12/27/2012  . Paroxysmal atrial fibrillation (HCC) 10/19/2011  . ED (erectile dysfunction)   . Type II diabetes mellitus, uncontrolled (HCC)   . OSA (obstructive sleep apnea)    Past Medical History:  Past Medical History:  Diagnosis Date  . Atrial fibrillation with RVR (HCC) 10/2011   lone episode, converted with IV dilt, on coumadin given elevated CHADS2  . Chest pain 03/06/2015   Myoview 07/2018: EF 33, no ischemia (EF normal by recent echo)  . Chronic combined systolic and diastolic CHF (congestive heart failure) (HCC)    Echo 2014: EF 20-25 // Echo 07/2018: mild LVE, EF 55, normal RVSF, mod LAE, mod MR, mild PI, mild aortic root dilation (38 mm)  . ED (erectile dysfunction)   . History of chicken pox   . History of noncompliance with medical treatment 03/06/2015  . Hypertension   . Hypertensive heart disease with heart failure (HCC) 03/28/2016  . Hyperthyroidism 03/15/2016  . Long term current use of anticoagulant therapy 01/14/2013  . Mitral regurgitation    Moderate by echo 07/2011  . Nonischemic cardiomyopathy (HCC) 12/27/2012  . Normocytic anemia 03/15/2016  . Obesity (BMI 30-39.9)  01/21/2014  . OSA (obstructive sleep apnea)   . Paroxysmal atrial fibrillation (HCC) 10/19/2011   lone episode, converted with IV dilt, on coumadin given elevated CHADS2   . 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  cath 2004 negative for obstruction, last echo 07/2011 EF 35%. b) Normal coronary arteries by cath in 2004 (when EF was noted at 15%). c) Last EF 20% by echo 12/2012  . Type II diabetes mellitus, uncontrolled (HCC)   . Type II or unspecified type diabetes mellitus without mention of complication, uncontrolled   . Warfarin anticoagulation    Past Surgical History:  Past Surgical History:  Procedure Laterality Date  . CARDIAC CATHETERIZATION  2004   no blockages  . HERNIA REPAIR  2002  . IR ANGIO VERTEBRAL SEL SUBCLAVIAN INNOMINATE UNI R MOD SED  09/25/2018  . IR CT HEAD LTD  09/28/2018  . IR PERCUTANEOUS ART THROMBECTOMY/INFUSION INTRACRANIAL INC DIAG ANGIO  10/12/2018  . KNEE SURGERY     L knee in HS  . RADIOLOGY WITH ANESTHESIA [name] 10/14/2018   Procedure: IR WITH ANESTHESIA;  Surgeon: Julieanne Cotton, MD;  Location: [facility] OR;  Service: Radiology;  Laterality: [name];  . US ECHOCARDIOGRAPHY  07/2011   mod LVH, EF 35%, diffuse hypokinesis of entire myocardium, irreversible restrictive pattern (grade 4 diastolic dysfunction), mod MR, mod dilated LA/RA, decreased RV systolic fxn, no PFO  . US ECHOCARDIOGRAPHY  12/2012   severe LVH, EF 20-25%, mod MR, reduced RV systolic function   HPI:  48 yo presenting with left sided paralysis and  right gaze found to have acute right MCA M1 occlusion. Per char taken for EVR with successful revascularization. Intubated from 4/9 to 4/10. Reintubated 4/12-4/13 due to increased WOB. BSE performed 4/10 recommending continue NPO with MBS when appropriate. MRI shows Large acute right MCA territory infarct: Restricted diffusion seen involving the right frontal, temporal, and parietal lobes,  compatible with acute right MCA territory infarct. Involvement of the right basal ganglia and internal capsule, with patchy involvement of the right centrum semi ovale. Associated regional mass effect with mild attenuation of the right lateral ventricle. Associated edema and mild regional mass effect without significant midline shift. Scattered small volume petechial hemorrhage without associated hemorrhagic transformation. CXR 4/14 improving airspace disease at the right lung base. Resolved right   Assessment / Plan / Recommendation Clinical Impression  Pt demonstrated difficulty with language, cognition and speech during limited assessment. Initially there was no spontaneous verbal expression and no response during requests for imitation of words. He did imitate SLP humming briefly with initial suspicion of language involvement until he verbalized "water" and "that was good water". SLP was unsuccessful in eliciting additional verbal expression exacerbated by lethargy and decreased attention. Inaccurate responses to y/n questions. Pt indicated he was left handed with OT documenting right handedness in chart (confirmed via phone call to wife that pt is right handed). He followed one step commands with good consistency. Further diagnostic therapy will determine pt's receeptive and expressive language abilities. Dysarthria present from respiratory and phonatory standpoint. Educated wife briefly via phone re: language-cognitive eval.       SLP Assessment  SLP Recommendation/Assessment: Patient needs continued Theme park manager Pathology Services SLP Visit Diagnosis: Dysarthria and anarthria (R47.1);Cognitive communication deficit (R41.841)    Follow Up Recommendations  Inpatient Rehab    Frequency and Duration min 2x/week  2 weeks      SLP Evaluation Cognition  Overall Cognitive Status: Impaired/Different from baseline Arousal/Alertness: Lethargic Orientation Level: Oriented to person;Oriented to  place(via yes/no gestures) Attention: Sustained Sustained Attention: Impaired Sustained Attention Impairment: Verbal basic;Functional basic Memory: (will continue to assess) Awareness: Impaired Awareness Impairment: Emergent impairment Problem Solving: Impaired Problem Solving Impairment: Functional basic Executive Function: Self Monitoring;Self Correcting Safety/Judgment: Impaired       Comprehension  Auditory Comprehension Overall Auditory Comprehension: Impaired Yes/No Questions: Impaired Basic Biographical Questions: 51-75% accurate Basic Immediate Environment Questions: 25-49% accurate Commands: Impaired Two Step Basic Commands: 0-24% accurate Interfering Components: Attention;Processing speed Visual Recognition/Discrimination Discrimination: Not tested Reading Comprehension Reading Status: (TBA)    Expression Expression Primary Mode of Expression: Other (comment)(primarily gestures) Verbal Expression Overall Verbal Expression: Impaired Initiation: Impaired Level of Generative/Spontaneous Verbalization: Word Repetition: (imitated humming- very low intensity) Naming: (no response) Pragmatics: Impairment Impairments: Abnormal affect;Eye contact Interfering Components: Attention Written Expression Dominant Hand: Right(indicated he was left hand, wife clarified he is right hande) Written Expression: (TBA)   Oral / Motor  Oral Motor/Sensory Function Overall Oral Motor/Sensory Function: Severe impairment Facial ROM: Suspected CN VII (facial) dysfunction Facial Symmetry: Abnormal symmetry right;Suspected CN VII (facial) dysfunction Motor Speech Overall Motor Speech: Impaired Respiration: Impaired Level of Impairment: Word Phonation: Low vocal intensity;Wet;Hoarse Resonance: Within functional limits Articulation: Impaired Level of Impairment: Phrase Intelligibility: Intelligibility reduced Word: 75-100% accurate Phrase: 75-100% accurate Motor Planning:  Impaired Level of Impairment: Sentence Motor Speech Errors: Unaware   GO                    Royce Macadamia 10/02/2018, 11:56 AM   Breck Coons Lonell Face.Ed [employer]  Pathologist Pager [phone] Office [phone]

## 2018-10-02 NOTE — Progress Notes (Signed)
PT Cancellation Note  Patient Details Name: Johnathan Archer MRN: [id number] DOB: 08/19/1970   Cancelled Treatment:    Reason Eval/Treat Not Completed: Other (comment) - Pt transferring to 3W upon PT arrival to room. PT to check back as schedule allows.  Nicola Police, PT Acute Rehabilitation Services Pager [phone]  Office [phone]    [name] 10/02/2018, 1:23 PM

## 2018-10-02 NOTE — Progress Notes (Addendum)
Inpatient Diabetes Program Recommendations  AACE/ADA: New Consensus Statement on Inpatient Glycemic Control  Target Ranges:  Prepandial:   less than 140 mg/dL      Peak postprandial:   less than 180 mg/dL (1-2 hours)      Critically ill patients:  140 - 180 mg/dL   Results for ISAHIA, BROICH (MRN [id number]) as of 10/02/2018 08:43  Ref. Range 10/01/2018 08:00 10/01/2018 09:49 10/01/2018 12:08 10/01/2018 15:52 10/01/2018 20:05 10/01/2018 23:59 10/02/2018 03:35 10/02/2018 08:19  Glucose-Capillary Latest Ref Range: 70 - 99 mg/dL 66 (L) 95 768 (H) 115 (H) 211 (H) 158 (H) 156 (H) 119 (H)   Review of Glycemic Control  Diabetes history: DM2 Outpatient Diabetes medications: Tresiba 8 mg daily, Metformin 750 mg BID Current orders for Inpatient glycemic control: Lantus 15 units QHS, Novolog 0-15 units Q4H, Novolog 4 units QID (for tube feeding coverage); Glucerna 237 ml QID (10:00, 14:00, 18:00, 22:00)  Inpatient Diabetes Program Recommendations:   Insulin-Basal: Please consider increasing Lantus to 18 units QHS.  Insulin - Tube Feeding Coverage: Please consider increasing tube feeding coverage to Novolog 6 units QID for tube feeding coverage (scheduled at same timing as Glucerna 237 ml QID; 10:00, 14:00, 18:00, 22:00).   Thanks, Orlando Penner, RN, MSN, CDE Diabetes Coordinator Inpatient Diabetes Program [phone] (Team Pager from 8am to 5pm)

## 2018-10-02 NOTE — Progress Notes (Signed)
eLink Physician-Brief Progress Note Patient Name: Johnathan Archer DOB: 03/26/1971 MRN: [id number]   Date of Service  10/02/2018  HPI/Events of Note  I and o cath multiple times now.  eICU Interventions  Insert foley cath     Intervention Category Intermediate Interventions: Other:  Ranee Gosselin 10/02/2018, 12:52 AM

## 2018-10-02 NOTE — Progress Notes (Addendum)
S/O: Patient seen in conjunction with CCM for worsened level of consciousness and sonorous breathing with aphasia.   BP (!) 157/79 (BP Location: Right Arm)   Pulse 74   Temp 98.2 F (36.8 C) (Oral)   Resp 20   Ht 6' (1.829 m)   Wt 97 kg   SpO2 95%   BMI 29.00 kg/m   General: Tachypneic with sonorous breathing.   Ment: Intermittently with eyes closed in a sleep-like state with lack of responsiveness to commands. With sternal rub, opens eyes without gazing at stimuli, but will follow commands and answer questions by squeezing examiner's hand with his right hand for "yes". CN: Pupils equal and round. Blinks to threat on right but not on the left. Weak doll's eye reflex. Right gaze preference can be overcome with oculocephalic maneuver. No nystagmus. Left facial droop.  Motor: Flaccid LUE and LLE.  Moves RUE and RLE to command without increased latency. Sensory: Will respond to stimulation of LUE and LLE after a delay by squeezing examiner's hand with his right.    Labs: ABG was unremarkable  A/R: 48 year old male admitted for large right MCA stroke, now with new onset of dense expressive aphasia and decreased LOC 1. DDx for worsening based on exam includes hemorrhagic conversion of right MCA stroke, worsened mass effect and new left MCA stroke.  2. STAT CT head preliminary review reveals progression of hypodensity from recent right MCA infarction, worsened mass effect and worsened right to left midline shift.  3. STAT CTA of head and neck has been ordered to further assess for possible new left MCA occlusion.  4. Hypertonic saline is being started.  5. Transferring to neuro ICU.  6. CCM to make final decision regarding intubation based on their criteria. Discussed with his wife over the telephone, who consented to all life-saving measures deemed necessary by the medical team.   50 minutes spent in the emergent neurological evaluation and management of this critically ill patient.    Addendum: CT head and CTA head and neck official report available. Findings also discussed with Radiology:  CT head: 1. 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 Stable petechial hemorrhage. No new stroke or hemorrhage identified. 2. Increased edema and mass effect with 10 mm right-to-left midline shift and effacement of right lateral ventricle, previously 6 mm right-to-left midline shift. No downward herniation at this time.  CTA neck: 1. Patent carotid and vertebral arteries. No dissection, aneurysm, or hemodynamically significant stenosis utilizing NASCET criteria. 2. Increased ground-glass opacification in the upper right lung, suspected neurogenic edema or possibly pneumonitis.  CTA head: 1. Patent anterior and posterior intracranial circulation. No new large vessel occlusion, aneurysm, or significant stenosis.  Amended A/R:  -- Frequent neuro checks -- Tranferring to Neuro ICU -- Discussed ground glass opacification of upper right lung, as seen on CTA, with CCM. Most likely due to aspiration.   Electronically signed: Dr. Caryl Pina

## 2018-10-02 NOTE — Progress Notes (Addendum)
NAME:  Johnathan Archer, MRN:  [id number], DOB:  04-30-1971, LOS: 5 ADMISSION DATE:  09/23/2018, CONSULTATION DATE:  09/25/2018 REFERRING MD:  Dr. Loni Beckwith, CHIEF COMPLAINT:  CVA  Brief History   7 yoM, LSW 1000 am 4/9, presented with left sided paralysis and right gaze found to have acute right MCA M1 occlusion.  Difficult intubation for airway protection.  Taken for EVR with successful revascularization.    History of present illness   HPI obtained from medical chart review as patient is intubated/ sedated   48 year old male with history of Afib on xarelto, chronic systolic / dystolic HF, DMT2, HTN, MR, NICM, anemia, obesity, and OSA, last seen well around 1000 by coworkers found wedged between sink and bathroom stall at work.  Presented as code stroke with left sided paralysis and right gaze.  Hypertensive at 221/ 145.  Found on CT to have acute right MCA M1 occlusion.  Intubated 4/9  for airway protection, was a difficult intubation.  Taken to IR with successful revascularization of right MCA M1 occlusion achieving TICI 2b revascularization.  Returns to ICU, PCCM to consult for ventilator management.   Past Medical History  Afib on xarelto, chronic systolic / dystolic HF, DMT2, HTN, MR, NICM, anemia, obesity, OSA, ED   Significant Hospital Events   4/9 Admitted / EVR 4/10 Extubated, started on 3% saline 4/11 Remains on Cleviprex, Failed swallowing evaluation 4/10, left n.p.o. with gastric tube in place, fevers 4/12 Intubated early am for airway compromise/ repeat MRI, rising Na, 3% held, pan-cultured and started on abx  4/13  Re-extubated   Consults:  Neurology   Procedures:  4/9 ETT >>4/10; 4/12 >> 4/13  4/9 R radial aline >> 4/9 cerebral angiogram   Significant Diagnostic Tests:  4/9 CTH >> 1. Hyperdense right MCA compatible with acute embolus. Early infarct in the right basal ganglia and insula 2. ASPECTS is 8  4/9 CTA head/ neck >> Acute occlusion of the terminal  right internal carotid artery with large amount of clot extending into the right MCA M1 and M2 branches. Poor flow in right MCA branches. Small amount of clot in right A1 segment. Findings compatible with acute embolus. No significant atherosclerotic disease in the neck or head.  4/9 CTH post intervention >> Hyperdensity within the right basal ganglia may indicate contrast staining or petechial hemorrhage. No significant mass effect.  4/10 MRI brain >> evolving right MCA M1 stroke, culprit vessel is patent.  Some petechial changes without overt hemorrhagic conversion, no shift  Echocardiogram 4/10 >> normal LV function 60-65%, mildly increased LV wall thickness without diastolic dysfunction, normal RV function, no evidence of shunt by bubble study,  4/12 MRI Brain >> continued interval evolution large right MCA CVA, scattered petechial hemorrhage slightly increased, increased gyral swelling with edema and regional mass-effect.  Right to left shift increased from 4 mm to 6 mm.  No hydrocephalus.  A few new subcentimeter acute left cerebral infarcts  Micro Data:  4/9 MRSA PCR >> neg  4/12 BC x 2 >> 4/12 UC >> 4/12 trach asp >> mod gpc, few gnr >>>  Antimicrobials:  4/9 ancef preop Unaysn 4/12 (? Asp) >>>   Interim history/subjective:  Is somewhat responsive to verbal/ able to follow some commands on R   Objective   Blood pressure 132/68, pulse 73, temperature 98.6 F (37 C), temperature source Axillary, resp. rate 18, height 6' (1.829 m), weight 97 kg, SpO2 95 %.  RA  Intake/Output Summary (Last 24 hours) at 10/02/2018 1253 Last data filed at 10/02/2018 1200 Gross per 24 hour  Intake 1597.9 ml  Output 2325 ml  Net -727.1 ml   Filed Weights   Oct 04, 2018 1320 09/29/18 0500 09/30/18 0630  Weight: 105 kg 97.6 kg 97 kg   Examination: Pt alert, NAD, following commands on R  No jvd Oropharynx clear,  mucosa nl Neck supple Lungs with a few scattered exp > insp rhonchi  bilaterally RRR no s3 or or sign murmur Abd obese with nl excursion  Extr warm with no edema or clubbing noted    pCXR    10/04/18 :    I personally reviewed images and agree with radiology impression as follows:   1. Improving airspace disease at the right lung base. Resolved right pleural effusion.    Resolved Hospital Problem list    Assessment & Plan:  Right MCA M1 occlusion s/p EVR with successful revascularization P:  Plans per stroke team and interventional radiology, suspect the degree of his neurological injury impacting airway protection. Goal Na 150-155  Hypertonic saline held 4/12, ongoing free water 300 ml q 3hr D5W increased to 50 cc/h 4/14  SBP goal < 180  Acute respiratory failure, impaired airway protection Difficult airway OSA NPO will need SLP post extubation 4/13    Dysphasia secondary to CVA  Continue tube feeding SLP ongoing f/u   Acute renal failure, consider hypertensive, contrast media.   Lab Results  Component Value Date   CREATININE 1.75 (H) 10/02/2018   CREATININE 1.67 (H) 10/01/2018   CREATININE 1.57 (H) 09/30/2018   CREATININE 1.20 05/18/2016   CREATININE 1.01 04/25/2016   CREATININE 1.05 03/15/2016    Minimal  rising sCr, good UOP still rec Continue to follow BMP, urine output  Likely RLL aspiration  Fever- improving  P:  Tylenol PRN Continue unasyn as per above flow sheet  Follow culture data w/a   Afib on xarelto P:  Xarelto on hold, defer to neurology as to when this can be restarted in light of the petechial changes on his MRI brain 4/10 and 4/12 Amiodarone restarted 4/11  Chronic systolic/ diastolic HF - 2/20 EF 55%, normal RV, mod MR, mild dilation of aortic root.  Echocardiogram 4/11 with improved LV function and no evidence of impaired LV relaxation as above P:  Restarted home carvedilol at lower dose 4/11, Entresto on 4/12  Hypertension, UDS negative P:  SBP goals < 180  = adequately rx'd  Continue coreg 25 mg  BID, ASA, hydralazine  BID, entresto, spironolactone  Holding lasix given Na Prn labetalol/ apresoline  DMT2 P:  Sliding-scale insulin - resistant scale decrease to mod given episode of hypoglycemia lantus 15 units daily  Home metformin and Tresiba insulin are on hold  Hx normocytic anemia   Lab Results  Component Value Date   HGB 10.2 (L) 10/02/2018   HGB 10.6 (L) 10/01/2018   HGB 11.2 (L) 09/30/2018    P:  Follow CBC  Best practice:  Diet: NPO, TF Pain/Anxiety/Delirium protocol (if indicated): min sedation  VAP protocol (if indicated): no longer indcated  DVT prophylaxis: SCDs GI prophylaxis: PPI Glucose control: SSI resistant  Mobility: BR Code Status: Full Family Communication: none at bedside Disposition: to floor and pt can be seen prn by [facility] for medical issues at [facility] discretion  Labs   CBC: Recent Labs  Lab October 04, 2018 1248  09/28/18 0224  09/29/18 0320 09/30/18 1610 09/30/18 9604 09/30/18 5409 10/01/18 8119 10/02/18  0557  WBC 7.3  --  9.7  --  7.3  --  8.2  --  7.6 6.7  NEUTROABS 5.8  --  8.1*  --   --   --   --   --   --   --   HGB 12.5*   < > 11.9*   < > 11.2* 11.6* 12.0* 11.2* 10.6* 10.2*  HCT 38.7*   < > 37.0*   < > 36.8* 34.0* 41.6 33.0* 36.0* 34.6*  MCV 97.7  --  97.1  --  101.7*  --  105.3*  --  106.5* 105.5*  PLT 241  --  187  --  176  --  149*  --  139* 151   < > = values in this interval not displayed.    Basic Metabolic Panel: Recent Labs  Lab 09/28/18 0224  09/29/18 0320  09/30/18 0323 09/30/18 0428 09/30/18 0624  10/01/18 0712 10/01/18 1133 10/01/18 1830 10/01/18 2356 10/02/18 0557  NA 139   < > 154*   < > 164* 163* 167*   < > 165* 162* 159* 158* 160*  K 3.9   < > 3.9  --  3.8 4.0 3.5  --  3.6  --   --   --  3.8  CL 107  --  122*  --   --  129*  --   --  129*  --   --   --  124*  CO2 20*  --  22  --   --  25  --   --  28  --   --   --  27  GLUCOSE 186*  --  186*  --   --  298*  --   --  74  --   --   --  204*  BUN  25*  --  25*  --   --  23*  --   --  30*  --   --   --  32*  CREATININE 1.51*  --  1.53*  --   --  1.57*  --   --  1.67*  --   --   --  1.75*  CALCIUM 8.4*  --  8.3*  --   --  8.7*  --   --  8.8*  --   --   --  8.6*  MG 1.8  --   --   --   --   --   --   --   --   --   --   --  2.4  PHOS 3.3  --   --   --   --   --   --   --   --   --   --   --  3.2   < > = values in this interval not displayed.   GFR: Estimated Creatinine Clearance: 62.4 mL/min (A) (by C-G formula based on SCr of 1.75 mg/dL (H)). Recent Labs  Lab 09/29/18 0320 09/30/18 0428 10/01/18 0712 10/02/18 0557  WBC 7.3 8.2 7.6 6.7    Liver Function Tests: Recent Labs  Lab October 07, 2018 1248  AST 19  ALT 22  ALKPHOS 50  BILITOT 1.7*  PROT 7.3  ALBUMIN 3.3*   No results for input(s): LIPASE, AMYLASE in the last 168 hours. No results for input(s): AMMONIA in the last 168 hours.  ABG    Component Value Date/Time   PHART 7.381 09/30/2018 0624   PCO2ART  39.0 09/30/2018 0624   PO2ART 125.0 (H) 09/30/2018 0624   HCO3 22.8 09/30/2018 0624   TCO2 24 09/30/2018 0624   ACIDBASEDEF 2.0 09/30/2018 0624   O2SAT 99.0 09/30/2018 0624     Coagulation Profile: Recent Labs  Lab Oct 22, 2018 1248  INR 2.6*    Cardiac Enzymes: No results for input(s): CKTOTAL, CKMB, CKMBINDEX, TROPONINI in the last 168 hours.  HbA1C: Hgb A1c MFr Bld  Date/Time Value Ref Range Status  09/28/2018 02:24 AM 8.9 (H) 4.8 - 5.6 % Final    Comment:    (NOTE) Pre diabetes:          5.7%-6.4% Diabetes:              >6.4% Glycemic control for   <7.0% adults with diabetes   03/11/2017 07:41 PM 8.0 (H) 4.8 - 5.6 % Final    Comment:    (NOTE) Pre diabetes:          5.7%-6.4% Diabetes:              >6.4% Glycemic control for   <7.0% adults with diabetes     CBG: Recent Labs  Lab 10/01/18 2005 10/01/18 2359 10/02/18 0335 10/02/18 0819 10/02/18 1152  GLUCAP 211* 158* 156* 119* 130*    Will sign off - call if needed  Sandrea Hughs, MD  Pulmonary and Critical Care Medicine [facility] Cell [phone] After 5:30 PM or weekends, use Beeper [phone]

## 2018-10-02 NOTE — Progress Notes (Signed)
STROKE TEAM PROGRESS NOTE   INTERVAL HISTORY Patient is sitting up in bed.  He was extubated yesterday and so far has been breathing quite well.  He states he wants to eat.  He continues to have dense hemiplegia with mild left-sided neglect and gaze palsy but is able to speak and communicate better.  Blood pressure is adequately controlled he is off the Cardene drip.  Labetalol dose was increased yesterday  Vitals:   10/02/18 0800 10/02/18 0900 10/02/18 1000 10/02/18 1030  BP: 133/72 (!) 160/101  (!) 161/76  Pulse: 77 74 81 77  Resp: (!) 26 (!) 24 (!) 21 (!) 22  Temp: 99.1 F (37.3 C)     TempSrc: Axillary     SpO2: 97% 100% 99% 100%  Weight:      Height:        CBC:  Recent Labs  Lab 2019/03/15 1248  09/28/18 0224  10/01/18 0712 10/02/18 0557  WBC 7.3  --  9.7   < > 7.6 6.7  NEUTROABS 5.8  --  8.1*  --   --   --   HGB 12.5*   < > 11.9*   < > 10.6* 10.2*  HCT 38.7*   < > 37.0*   < > 36.0* 34.6*  MCV 97.7  --  97.1   < > 106.5* 105.5*  PLT 241  --  187   < > 139* 151   < > = values in this interval not displayed.    Basic Metabolic Panel:  Recent Labs  Lab 09/28/18 0224  10/01/18 0712  10/01/18 2356 10/02/18 0557  NA 139   < > 165*   < > 158* 160*  K 3.9   < > 3.6  --   --  3.8  CL 107   < > 129*  --   --  124*  CO2 20*   < > 28  --   --  27  GLUCOSE 186*   < > 74  --   --  204*  BUN 25*   < > 30*  --   --  32*  CREATININE 1.51*   < > 1.67*  --   --  1.75*  CALCIUM 8.4*   < > 8.8*  --   --  8.6*  MG 1.8  --   --   --   --  2.4  PHOS 3.3  --   --   --   --  3.2   < > = values in this interval not displayed.    IMAGING past 24h Dg Chest Port 1 View  Result Date: 10/02/2018 CLINICAL DATA:  Respiratory failure. EXAM: PORTABLE CHEST 1 VIEW COMPARISON:  Chest x-ray dated September 30, 2018. FINDINGS: Interval removal of the endotracheal tube. Unchanged feeding tube entering the stomach with the tip below the field of view. Stable cardiomediastinal silhouette. Normal  pulmonary vascularity. Improving opacity at the right lung base. No pleural effusion or pneumothorax. No acute osseous abnormality. IMPRESSION: 1. Improving airspace disease at the right lung base. Resolved right pleural effusion. Electronically Signed   By: Obie DredgeWilliam T Derry M.D.   On: 10/02/2018 07:52     PHYSICAL EXAM  : General -obese middle-aged African-American male who is intubated but not on sedation. Ophthalmologic - fundi not visualized due to noncooperation.  Cardiovascular - Regular rate and rhythm, not in afib.  Neuro -   drowsy but can be easily aroused. able to following simple commands on the  right. , eyes deviated to the right with left gaze palsy., pupil bilateral 2.5 mm, sluggish reaction to light. No blinking to visual threat bilaterally, right gaze with sustained nystagmus, left gaze palsy. Corneal reflex weak on the left but present on the right, gag and cough present.  Bilateral facial weakness. Tongue midline in mouth. Spontaneous moving RUE and RLE well against gravity, RUE 3-/5, RLE 2/5. LUE 0/5 and LLE slight withdraw on pain stimulation. DTR 1+ and left babinski. Sensation and coordination not cooperative and gait not tested.   ASSESSMENT/PLAN [name] is a 48 y.o. male with history of HTN, DB, CHF and AF on Xarelto found down at work, presenting with L hemiparesis and R gaze.   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 Early infarct R BG and insula.    CTA head & neck ELO R ICA, R M1, R M2. R A1 clot.   Cerebral angio R M1 TICI2b reperfusion  CT head hyperdensity R BG, contrast vs hmg. No mass effect  MRI  Large R MCA infarct, associated edema and mass effect w/o shift. prob small R cerebellar infarct. Small vessel disease.   MRA  Normal, previous R M1 open  2D Echo EF 60-65%. No source of embolus   LDL  73  HgbA1c 8.9  Heparin 5000 units sq tid or VTE prophylaxis Xarelto (rivaroxaban) daily prior to admission, now on No antithrombotic.  On ASA 325, consider resume DOAC in 5-7 days post stroke.  When able to swallow safely  Therapy recommendations: CIR  Disposition:  pending   Cerebral edema - Right cerebral peduncle compression Induced hypernatremia  09/28/2018 pt with right ptosis, right pupil dilation with reserved reflex, right gaze sustained nystagmus -> 09/29/2018 bilateral proptosis, minimal extraocular movement bilaterally, pupil equal sluggish to light, bilateral facial weakness, dysphagia  MRI right temporal pole edema with cerebral peduncle compression  CT repeat 09/29/2018- Increasing regional mass effect and edema with associated 4 mm right-to-left shift.   MRI repeat increased MLS to 23mm, brainstem compression stable  Treated with 3% protocol, drip off  Na 160  On free water 30 q 3h and D5 for 24h - sodium slowly dropping - continue to monitor  Acute Respiratory Failure OSA  Initial intubation in ED for IR  Extubated 4/10  reintubated 4/11 d/t increased WOB  Extubated 4/14  Consider nocturnal CPAP if extubated  CCM onboard  Atrial Fibrillation w/ RVR  Home anticoagulation:  Xarelto (rivaroxaban) daily  . Home meds: amiodarone 200 . Rate controlled now . On ASA 325  Consider resume DOAC in 5-7 days post stroke.   Chronic combined systolic and diastolic CHF  Home meds: Coreg 37.5 bid, lasix 60 bid, hydralazine 50 bid, isosorbide 30, entresto bid, spironolactone 25  CXR 2018-10-14 lungs clear  resumed home meds - Coreg 25 bid, lasix 60 bid, hydralazine 50 bid, isosorbide 30, entresto bid, spironolactone 25  Fever   Tmax 99.9  Blood culture no growth    urine culture no growth   resp culture normal respiratory flora  Continue hydration  CXR  Improving airspace R lung base. Resolved right pleural effusion  Unasyn 4/12>>  CCM on  board  Dysphagia  Secondary to stroke  On tube feeds via Cortrak  SLP following - pt unable to participate today  Urinary Retention  Foley inserted 4/14  after multiple I&O  Hypertension  Home meds:  Coreg 37.5 bid, lasix 60 bid, hydralazine 50 bid, isosorbide 30, entresto bid, spironolactone 25  BP goal < 180  Off cleviprex  On Coreg 25 bid, lasix 60 bid, hydralazine 50 bid, isosorbide 30, entresto bid, spironolactone 25 . Long-term BP goal normotensive  Hyperlipidemia  Home meds:  No statin  LDL 73, goal < 70  Add lipitor 20  Continue statin at discharge  Diabetes type II uncontrolled  Home meds:  Metformin 500 bid, insulin degludec 8u  HgbA1c 8.9, goal < 7.0  Hyperglycemia  CCM on board  Lantus to 18u q hs (increased)  novolog 0-15 q4h  Novolog 6u QID for TF coverage (increased)  Hold off metformin due to elevated Cre  CBGs  SSI  Other Stroke Risk Factors  Obesity, Body mass index is 29 kg/m., recommend weight loss, diet and exercise as appropriate   Obstructive sleep apnea  Nonischemic CM  Other Active Problems  AKI on CKD stage II Cr 1.75  Normocytic Anemia 12.5->10.2, MCV 105.5 - check VB 12 and folate  Thrombocytopenia, resolved 241->151  Hospital day # 5 Continue mobilization out of bed.  Therapy consults.  Speech therapy for swallow eval.  Transfer to neurology floor bed.  Will start anticoagulation when able to swallow safely.This patient is critically ill and at significant risk of neurological worsening, death and care requires constant monitoring of vital signs, hemodynamics,respiratory and cardiac monitoring, extensive review of multiple databases, frequent neurological assessment, discussion with family, other specialists and medical decision making of high complexity.I have made any additions or clarifications directly to the above note.This critical care time does not reflect procedure time, or teaching time or supervisory  time of PA/NP/Med Resident etc but could involve care discussion time.  I spent 30 minutes of neurocritical care time  in the care of  this patient.      Delia Heady, MD Medical Director [facility] Pager: [phone] 10/02/2018 10:34 AM   To contact Stroke Continuity provider, please refer to [email or web address]. After hours, contact General Neurology

## 2018-10-02 NOTE — Progress Notes (Signed)
Physical Therapy Treatment Patient Details Name: Johnathan Archer MRN: [id number] DOB: [birth year] Today's Date: 10/02/2018    History of Present Illness 48 year old male with history of Afib, chronic systolic/dystolic HF, DMT2, HTN, MR, NICM, anemia, obesity, and OSA, found down by coworkers wedged between sink and bathroom stall at work.  Presented as code stroke with left sided paralysis and right gaze.  Hypertensive at 221/ 145.  Found on CT to have acute right MCA M1 occlusion. Pt Intubated for airway protection. Taken to IR with successful revascularization of right MCA M1 occlusion. Pt extubated 4/10. Reintubated 4/12-4/13.    PT Comments    Pt with increased time sitting EOB this session, but pt continues to be limited by lethargy. Pt with brief periods of wakefulness and interaction/participation with PT, otherwise pt required max assist +2 for participation. Pt with difficulty managing secretions this session, producing multiple coughs during session but unable to push secretions from mouth. Pt with drooling and protrusion of secretions at times during session, removal assisted by PT with washcloth. RN setting up wall suction for pt upon PT exit upon PT request. PT to continue to follow acutely to progress pt as able.    Follow Up Recommendations  CIR;Supervision/Assistance - 24 hour     Equipment Recommendations  Other (comment)(TBA )    Recommendations for Other Services Rehab consult     Precautions / Restrictions Precautions Precautions: Fall Precaution Comments: L shoulder flaccidity with mild subluxation, careful with L shoulder Restrictions Weight Bearing Restrictions: No    Mobility  Bed Mobility Overal bed mobility: Needs Assistance Bed Mobility: Rolling;Sidelying to Sit;Sit to Supine Rolling: Max assist;+2 for physical assistance;+2 for safety/equipment Sidelying to sit: Max assist;+2 for physical assistance;+2 for safety/equipment;HOB elevated   Sit to  supine: Max assist;+2 for physical assistance;HOB elevated   General bed mobility comments: Rolling assist for trunk and LE translation to EOB, pt able to reach with RUE towards opposite rail when initiated by PT. Care taken to protract L shoulder when rolling for shoulder protection. Max assist for supine<>sit for trunk elevation and lowering, LE management especially LLE, and placement of feet on ground. Pt with tendency to place RUE on surfaces and attempt to push towards L side, RUE placed in pt's lap. Pt with gurgling and rattling of secretions with mobility, yellow-green secretions coming out of mouth and removed with washcloths. Pt able to initiate and wipe mouth with washcloth sporadically.   Transfers Overall transfer level: (NT - pt with decreased arousal, lethargy)                  Ambulation/Gait Ambulation/Gait assistance: (NT )               Stairs             Wheelchair Mobility    Modified Rankin (Stroke Patients Only) Modified Rankin (Stroke Patients Only) Pre-Morbid Rankin Score: No symptoms Modified Rankin: Severe disability     Balance       Sitting balance - Comments: Pt sat EOB ~15 minutes working on static and dynamic seated balance, core strength. Pt able to reach with RUE directly in front of pt at 90*, towards R side in 75* shoulder flexion with mod assist trunk support. Pt able to turn head L and R with PT assist, more limited towards R rotation. Pt with difficulty tracking with PT visually beyond midline towards L. When head was righted to midline from L lateral flexion and rotation, pt able  to sit wiith min to mod assist. Otherwise, pt required max assist to sit EOB. Tactile cuing for neutral-anterior pelvic tilt, chest opening, and upright sitting.  Postural control: Posterior lean                                  Cognition Arousal/Alertness: Lethargic Behavior During Therapy: Flat affect Overall Cognitive Status:  Impaired/Different from baseline Area of Impairment: Attention;Following commands;Orientation;Problem solving                 Orientation Level: (unable to assess, pt with difficulty responding to PT due to lethargy) Current Attention Level: Focused   Following Commands: Follows one step commands inconsistently;Follows one step commands with increased time     Problem Solving: Difficulty sequencing;Decreased initiation;Slow processing;Requires verbal cues;Requires tactile cues General Comments: Pt with brief periods of eye opening and arousal during PT session, minimal verbalizations today. Pt able to follow one step commands when awake, moves RUE and LE when asked.       Exercises Other Exercises Other Exercises: R cervical rotation and sidebending stretch, 60 seconds total, to address neck tightness. Pt positioned with towel roll on L side of head and neck so pt would be in neutral head position.    General Comments General comments (skin integrity, edema, etc.): Pt with heavy secretions this session, PT called for suction to be set up in pt room for secretion management. When pt returned to supine, he had period of what appeared to be difficulty managing secretions and difficulty breathing noted in noticable abdominal contractions. Pt with brief period of R beating nystagmus, lasting seconds. When asked if pt was okay, pt states in whisper "I'm fine". Pt with snoring-type breathing after session, PT noticed periods of suspected apnea, evidenced in brief periods of no breath sounds followed by deep inhale. RN notified.       Pertinent Vitals/Pain Pain Assessment: Faces Faces Pain Scale: Hurts a little bit Pain Location: generalized Pain Descriptors / Indicators: Grimacing;Guarding Pain Intervention(s): Limited activity within patient's tolerance;Monitored during session;Repositioned    Home Living                      Prior Function            PT Goals (current  goals can now be found in the care plan section) Acute Rehab PT Goals PT Goal Formulation: Patient unable to participate in goal setting Time For Goal Achievement: 10/12/18 Potential to Achieve Goals: Good Progress towards PT goals: Progressing toward goals    Frequency    Min 4X/week      PT Plan Current plan remains appropriate    Co-evaluation              AM-PAC PT "6 Clicks" Mobility   Outcome Measure  Help needed turning from your back to your side while in a flat bed without using bedrails?: Total Help needed moving from lying on your back to sitting on the side of a flat bed without using bedrails?: Total Help needed moving to and from a bed to a chair (including a wheelchair)?: Total Help needed standing up from a chair using your arms (e.g., wheelchair or bedside chair)?: Total Help needed to walk in hospital room?: Total Help needed climbing 3-5 steps with a railing? : Total 6 Click Score: 6    End of Session   Activity Tolerance: Patient limited by lethargy Patient  left: in bed;with bed alarm set;with call bell/phone within reach;with nursing/sitter in room Nurse Communication: Mobility status;Other (comment)(need for suction kit) PT Visit Diagnosis: Other abnormalities of gait and mobility (R26.89);Hemiplegia and hemiparesis Hemiplegia - Right/Left: Left Hemiplegia - dominant/non-dominant: Non-dominant Hemiplegia - caused by: Cerebral infarction     Time: 4034-7425 PT Time Calculation (min) (ACUTE ONLY): 26 min  Charges:  $Therapeutic Activity: 23-37 mins                    Julien Girt, PT Acute Rehabilitation Services Pager [phone]  Office [phone]  Venisha Boehning D Lason Eveland 10/02/2018, 4:01 PM

## 2018-10-02 NOTE — ED Provider Notes (Signed)
[facility]  Department of Emergency Medicine   Code Blue CONSULT NOTE  Chief Complaint: Cardiac arrest/unresponsive   Level V Caveat: Unresponsive  History of present illness: I was contacted by the hospital for a CODE BLUE cardiac arrest in the emergency department CT scanner and presented to the patient's bedside.  On my arrival I was updated the patient was admitted on the ninth for stroke was extubated and doing better but then today had acute onset of respiratory distress I was concerned for possible worsening stroke, bleed or edema so brought down for a stat head CT and shortly afterwards patient had cardiac arrest.  1 dose of epinephrine was given and compressions were started.  Patient being bagged this time.  ROS: Unable to obtain, Level V caveat  Scheduled Meds: . albuterol  2.5 mg Nebulization QID  . albuterol      . amiodarone  200 mg Per Tube Daily  . aspirin  325 mg Per Tube Daily  . atorvastatin  20 mg Per Tube q1800  . carvedilol  25 mg Per Tube BID  . chlorhexidine  15 mL Mouth Rinse BID  . feeding supplement (GLUCERNA 1.5 CAL)  237 mL Per Tube QID  . feeding supplement (PRO-STAT SUGAR FREE 64)  30 mL Per Tube TID  . free water  300 mL Per Tube Q3H  . heparin injection (subcutaneous)  5,000 Units Subcutaneous Q8H  . hydrALAZINE  50 mg Per Tube BID  . insulin aspart  0-15 Units Subcutaneous Q4H  . insulin aspart  6 Units Subcutaneous QID  . insulin glargine  18 Units Subcutaneous QHS  . isosorbide dinitrate  10 mg Per Tube TID  . mouth rinse  15 mL Mouth Rinse q12n4p  . pantoprazole  40 mg Oral Daily   Or  . pantoprazole sodium  40 mg Per Tube Daily  . sacubitril-valsartan  1 tablet Oral BID  . spironolactone  25 mg Per Tube Daily   Continuous Infusions: . sodium chloride Stopped (09/30/18 1750)  . ampicillin-sulbactam (UNASYN) IV 3 g (10/02/18 1840)  . clevidipine Stopped (09/29/18 1157)  . dextrose 50 mL/hr at 10/02/18 1618  . sodium  chloride (hypertonic)     PRN Meds:.sodium chloride, acetaminophen **OR** acetaminophen (TYLENOL) oral liquid 160 mg/5 mL **OR** acetaminophen, hydrALAZINE, iohexol, labetalol Past Medical History:  Diagnosis Date  . Atrial fibrillation with RVR (HCC) 10/2011   lone episode, converted with IV dilt, on coumadin given elevated CHADS2  . Chest pain 03/06/2015   Myoview 07/2018: EF 33, no ischemia (EF normal by recent echo)  . Chronic combined systolic and diastolic CHF (congestive heart failure) (HCC)    Echo 2014: EF 20-25 // Echo 07/2018: mild LVE, EF 55, normal RVSF, mod LAE, mod MR, mild PI, mild aortic root dilation (38 mm)  . ED (erectile dysfunction)   . History of chicken pox   . History of noncompliance with medical treatment 03/06/2015  . Hypertension   . Hypertensive heart disease with heart failure (HCC) 03/28/2016  . Hyperthyroidism 03/15/2016  . Long term current use of anticoagulant therapy 01/14/2013  . Mitral regurgitation    Moderate by echo 07/2011  . Nonischemic cardiomyopathy (HCC) 12/27/2012  . Normocytic anemia 03/15/2016  . Obesity (BMI 30-39.9) 01/21/2014  . OSA (obstructive sleep apnea)   . Paroxysmal atrial fibrillation (HCC) 10/19/2011   lone episode, converted with IV dilt, on coumadin given elevated CHADS2   . Systolic and diastolic CHF, chronic (HCC) 2004  a) Idiopathic dilated CM, 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  cath 2004 negative for obstruction, last echo 07/2011 EF 35%. b) Normal coronary arteries by cath in 2004 (when EF was noted at 15%). c) Last EF 20% by echo 12/2012  . Type II diabetes mellitus, uncontrolled (HCC)   . Type II or unspecified type diabetes mellitus without mention of complication, uncontrolled   . Warfarin anticoagulation    Past Surgical History:  Procedure Laterality Date  . CARDIAC CATHETERIZATION  2004   no blockages  . HERNIA REPAIR  2002  . IR ANGIO VERTEBRAL SEL SUBCLAVIAN INNOMINATE UNI R MOD SED  Oct 15, 2018  . IR CT HEAD  LTD  10-15-2018  . IR PERCUTANEOUS ART THROMBECTOMY/INFUSION INTRACRANIAL INC DIAG ANGIO  2018-10-15  . KNEE SURGERY     L knee in HS  . RADIOLOGY WITH ANESTHESIA [name] 2018/10/15   Procedure: IR WITH ANESTHESIA;  Surgeon: Julieanne Cotton, MD;  Location: [facility] OR;  Service: Radiology;  Laterality: [name];  . US ECHOCARDIOGRAPHY  07/2011   mod LVH, EF 35%, diffuse hypokinesis of entire myocardium, irreversible restrictive pattern (grade 4 diastolic dysfunction), mod MR, mod dilated LA/RA, decreased RV systolic fxn, no PFO  . US ECHOCARDIOGRAPHY  12/2012   severe LVH, EF 20-25%, mod MR, reduced RV systolic function   Social History   Socioeconomic History  . Marital status: Married    Spouse name: Not on file  . Number of children: Not on file  . Years of education: Not on file  . Highest education level: Not on file  Occupational History  . Not on file  Social Needs  . Financial resource strain: Not on file  . Food insecurity:    Worry: Not on file    Inability: Not on file  . Transportation needs:    Medical: Not on file    Non-medical: Not on file  Tobacco Use  . Smoking status: Never Smoker  . Smokeless tobacco: Never Used  Substance and Sexual Activity  . Alcohol use: No    Alcohol/week: 0.0 standard drinks    Comment: Occasional 1x/mo  . Drug use: No  . Sexual activity: Not on file  Lifestyle  . Physical activity:    Days per week: Not on file    Minutes per session: Not on file  . Stress: Not on file  Relationships  . Social connections:    Talks on phone: Not on file    Gets together: Not on file    Attends religious service: Not on file    Active member of club or organization: Not on file    Attends meetings of clubs or organizations: Not on file    Relationship status: Not on file  . Intimate partner violence:    Fear of current or ex partner: Not on file    Emotionally abused: Not on file    Physically abused: Not on file    Forced sexual activity: Not on file   Other Topics Concern  . Not on file  Social History Narrative   Caffeine: none   Lives with wife and daughter (8yo), no pets   Occupation: Naval architect   Edu: 78yr college   Activity: works, no regular activity   Diet: water daily, tries fruits/vegetables daily   No Known Allergies  Last set of Vital Signs (not current) Vitals:   10/02/18 1954 10/02/18 2010  BP: (!) 157/79   Pulse: 74   Resp: (!) 25 20  Temp: 98.2  F (36.8 C)   SpO2: 91% 95%      Physical Exam  Gen: unresponsive Cardiovascular: pulseless  Resp: apneic. Breath sounds equal bilaterally with bagging  Abd: nondistended  Neuro: GCS 3, unresponsive to pain  HEENT: No blood in posterior pharynx, gag reflex absent  Neck: No crepitus  Musculoskeletal: No deformity  Skin: warm  Procedures  INTUBATION Performed by: Marily Memos Required items: required blood products, implants, devices, and special equipment available Patient identity confirmed: provided demographic data and hospital-assigned identification number Time out: Immediately prior to procedure a "time out" was called to verify the correct patient, procedure, equipment, support staff and site/side marked as required. Indications: Cardiac arrest Intubation method: glidescope Preoxygenation: BVM Sedatives: etomidate Paralytic: rocuronium Tube Size: 7.5 cuffed Post-procedure assessment: chest rise and ETCO2 monitor Breath sounds: equal and absent over the epigastrium Tube secured by Respiratory Therapy Patient tolerated the procedure well with no immediate complications.  CRITICAL CARE Performed by: Marily Memos Total critical care time: 32 Critical care time was exclusive of separately billable procedures and treating other patients. Critical care was necessary to treat or prevent imminent or life-threatening deterioration. Critical care was time spent personally by me on the following activities: development of treatment plan with  patient and/or surrogate as well as nursing, discussions with consultants, evaluation of patient's response to treatment, examination of patient, obtaining history from patient or surrogate, ordering and performing treatments and interventions, ordering and review of laboratory studies, ordering and review of radiographic studies, pulse oximetry and re-evaluation of patient's condition.  Cardiopulmonary Resuscitation (CPR) Procedure Note  Directed/Performed by: Marily Memos I personally directed ancillary staff and/or performed CPR in an effort to regain return of spontaneous circulation and to maintain cardiac, neuro and systemic perfusion.    Medical Decision making  Please see code documentation for all the details.  Gave multiple doses of epinephrine, considered mannitol but could not get it quick enough, patient received ACLS, intubation and had return of spontaneous circulation.  Critical care at bedside at this time and will assume care from this point.   Marily Memos, MD 10/02/18 [phone]

## 2018-10-02 NOTE — Progress Notes (Signed)
Assisted tele visit to patient with wife.  Delron Comer Anne, RN  

## 2018-10-02 NOTE — Progress Notes (Signed)
Upon assessment, pt's eyes remain closed, but follows commands with "squizing hand, push on hand, show 3 fingers."patient snoring while sleeping with frequent apneic moment, and irregular breathing.   Prior RN states "this is what he does"

## 2018-10-02 NOTE — Progress Notes (Signed)
  Speech Language Pathology Treatment: Dysphagia  Patient Details Name: Johnathan Archer MRN: [id number] DOB: [birth year] Today's Date: 10/02/2018 Time: 6759-1638 SLP Time Calculation (min) (ACUTE ONLY): 19 min  Assessment / Plan / Recommendation Clinical Impression  Pt is lethargic but awakens to voice, intermittently falls asleep during session but easily aroused. Of greater concern however re: swallow safety, is his decreased ability to protect airway and manage secretions. Copious audible pharyngeal fluttering secretions present. Attempts at coughs are subtle throat clears and non productive. Significantly reduced respiratory effort. Moderate amount of thin water and ice chips spilled from oral cavity due to decreased control/cohesion and extra time to propel towards pharynx. He is eager for water but is not yet appropriate for MBS with neurological impairments and intubation x 2. Prognosis is good for instrumental assessment once effectiveness of throat clear/cough present, improvement in secretion management and sustain adequate alertness. Pt's family observed most of session and educated via E-link.   HPI HPI: 48 yo presenting with left sided paralysis and right gaze found to have acute right MCA M1 occlusion. Per char taken for EVR with successful revascularization. Intubated from 4/9 to 4/10. Reintubated 4/12-4/13 due to increased WOB. BSE performed 4/10 recommending continue NPO with MBS when appropriate. MRI shows Large acute right MCA territory infarct: Restricted diffusion seen involving the right frontal, temporal, and parietal lobes, compatible with acute right MCA territory infarct. Involvement of the right basal ganglia and internal capsule, with patchy involvement of the right centrum semi ovale. Associated regional mass effect with mild attenuation of the right lateral ventricle. Associated edema and mild regional mass effect without significant midline shift. Scattered small  volume petechial hemorrhage without associated hemorrhagic transformation. CXR 4/14 improving airspace disease at the right lung base. Resolved right      SLP Plan  Continue with current plan of care       Recommendations  Diet recommendations: NPO;Other(comment)(intermittent ice chips after oral care) Medication Administration: Via alternative means                Oral Care Recommendations: Oral care QID Follow up Recommendations: Inpatient Rehab SLP Visit Diagnosis: Dysphagia, unspecified (R13.10) Plan: Continue with current plan of care       GO                Royce Macadamia 10/02/2018, 11:04 AM   Breck Coons Lonell Face.Ed Nurse, children's [phone] Office [phone]

## 2018-10-03 DIAGNOSIS — Z978 Presence of other specified devices: Secondary | ICD-10-CM

## 2018-10-03 DIAGNOSIS — T17908A Unspecified foreign body in respiratory tract, part unspecified causing other injury, initial encounter: Secondary | ICD-10-CM

## 2018-10-03 DIAGNOSIS — J9622 Acute and chronic respiratory failure with hypercapnia: Secondary | ICD-10-CM

## 2018-10-03 DIAGNOSIS — J9621 Acute and chronic respiratory failure with hypoxia: Secondary | ICD-10-CM | POA: Diagnosis present

## 2018-10-03 LAB — RESPIRATORY PANEL BY PCR

## 2018-10-03 LAB — TYPE AND SCREEN
ABO/RH(D): A POS
Antibody Screen: NEGATIVE

## 2018-10-03 LAB — CBC
HCT: 34.1 % — ABNORMAL LOW (ref 39.0–52.0)
Hemoglobin: 10.6 g/dL — ABNORMAL LOW (ref 13.0–17.0)
MCH: 31.1 pg (ref 26.0–34.0)
MCHC: 31.1 g/dL (ref 30.0–36.0)
MCV: 100 fL (ref 80.0–100.0)
Platelets: UNDETERMINED 10*3/uL (ref 150–400)
RBC: 3.41 MIL/uL — ABNORMAL LOW (ref 4.22–5.81)
RDW: 13.9 % (ref 11.5–15.5)
WBC: 8.6 10*3/uL (ref 4.0–10.5)
nRBC: 0.2 % (ref 0.0–0.2)

## 2018-10-03 LAB — POCT I-STAT 7, (LYTES, BLD GAS, ICA,H+H)
Acid-Base Excess: 1 mmol/L (ref 0.0–2.0)
Acid-Base Excess: 4 mmol/L — ABNORMAL HIGH (ref 0.0–2.0)
Bicarbonate: 28.2 mmol/L — ABNORMAL HIGH (ref 20.0–28.0)
Bicarbonate: 29.6 mmol/L — ABNORMAL HIGH (ref 20.0–28.0)
Calcium, Ion: 1.18 mmol/L (ref 1.15–1.40)
Calcium, Ion: 1.21 mmol/L (ref 1.15–1.40)
HCT: 27 % — ABNORMAL LOW (ref 39.0–52.0)
HCT: 30 % — ABNORMAL LOW (ref 39.0–52.0)
Hemoglobin: 10.2 g/dL — ABNORMAL LOW (ref 13.0–17.0)
Hemoglobin: 9.2 g/dL — ABNORMAL LOW (ref 13.0–17.0)
O2 Saturation: 100 %
O2 Saturation: 81 %
Patient temperature: 98.1
Patient temperature: 99.3
Potassium: 3.6 mmol/L (ref 3.5–5.1)
Potassium: 4 mmol/L (ref 3.5–5.1)
Sodium: 158 mmol/L — ABNORMAL HIGH (ref 135–145)
Sodium: 160 mmol/L — ABNORMAL HIGH (ref 135–145)
TCO2: 29 mmol/L (ref 22–32)
TCO2: 32 mmol/L (ref 22–32)
pCO2 arterial: 41.6 mmHg (ref 32.0–48.0)
pCO2 arterial: 66.1 mmHg (ref 32.0–48.0)
pH, Arterial: 7.258 — ABNORMAL LOW (ref 7.350–7.450)
pH, Arterial: 7.441 (ref 7.350–7.450)
pO2, Arterial: 164 mmHg — ABNORMAL HIGH (ref 83.0–108.0)
pO2, Arterial: 53 mmHg — ABNORMAL LOW (ref 83.0–108.0)

## 2018-10-03 LAB — BASIC METABOLIC PANEL
Anion gap: 13 (ref 5–15)
BUN: 36 mg/dL — ABNORMAL HIGH (ref 6–20)
CO2: 25 mmol/L (ref 22–32)
Calcium: 8.4 mg/dL — ABNORMAL LOW (ref 8.9–10.3)
Chloride: 121 mmol/L — ABNORMAL HIGH (ref 98–111)
Creatinine, Ser: 2.22 mg/dL — ABNORMAL HIGH (ref 0.61–1.24)
GFR calc Af Amer: 39 mL/min — ABNORMAL LOW (ref >60)
GFR calc non Af Amer: 34 mL/min — ABNORMAL LOW (ref >60)
Glucose, Bld: 251 mg/dL — ABNORMAL HIGH (ref 70–99)
Potassium: 4.2 mmol/L (ref 3.5–5.1)
Sodium: 159 mmol/L — ABNORMAL HIGH (ref 135–145)

## 2018-10-03 LAB — SODIUM
Sodium: 159 mmol/L — ABNORMAL HIGH (ref 135–145)
Sodium: 160 mmol/L — ABNORMAL HIGH (ref 135–145)

## 2018-10-03 LAB — GLUCOSE, CAPILLARY
Glucose-Capillary: 201 mg/dL — ABNORMAL HIGH (ref 70–99)
Glucose-Capillary: 177 mg/dL — ABNORMAL HIGH (ref 70–99)
Glucose-Capillary: 249 mg/dL — ABNORMAL HIGH (ref 70–99)
Glucose-Capillary: 128 mg/dL — ABNORMAL HIGH (ref 70–99)
Glucose-Capillary: 180 mg/dL — ABNORMAL HIGH (ref 70–99)
Glucose-Capillary: 145 mg/dL — ABNORMAL HIGH (ref 70–99)
Glucose-Capillary: 133 mg/dL — ABNORMAL HIGH (ref 70–99)

## 2018-10-03 LAB — VITAMIN B12: Vitamin B-12: 241 pg/mL (ref 180–914)

## 2018-10-03 LAB — PHOSPHORUS: Phosphorus: 5.1 mg/dL — ABNORMAL HIGH (ref 2.5–4.6)

## 2018-10-03 LAB — SARS CORONAVIRUS 2 BY RT PCR (HOSPITAL ORDER, PERFORMED IN ~~LOC~~ HOSPITAL LAB): SARS Coronavirus 2: NEGATIVE

## 2018-10-03 LAB — ABO/RH: ABO/RH(D): A POS

## 2018-10-03 MED ORDER — CHLORHEXIDINE GLUCONATE 0.12% ORAL RINSE (MEDLINE KIT)
15.0000 mL | Freq: Two times a day (BID) | OROMUCOSAL | Status: DC
  Administered 2018-10-03 – 2018-10-10 (×17): 15 mL via OROMUCOSAL

## 2018-10-03 MED ORDER — AMIODARONE IV BOLUS ONLY 150 MG/100ML
150.0000 mg | Freq: Once | INTRAVENOUS | Status: DC
  Filled 2018-10-03: qty 100

## 2018-10-03 MED ORDER — BISACODYL 10 MG RE SUPP
10.0000 mg | Freq: Every day | RECTAL | Status: DC | PRN
  Administered 2018-10-06: 10 mg via RECTAL
  Filled 2018-10-03: qty 1

## 2018-10-03 MED ORDER — SODIUM CHLORIDE 0.9 % IV BOLUS
500.0000 mL | Freq: Once | INTRAVENOUS | Status: AC
  Administered 2018-10-03: 500 mL via INTRAVENOUS

## 2018-10-03 MED ORDER — SODIUM CHLORIDE 0.45 % IV SOLN
INTRAVENOUS | Status: DC
  Administered 2018-10-03 – 2018-10-04 (×2): via INTRAVENOUS

## 2018-10-03 MED ORDER — ORAL CARE MOUTH RINSE
15.0000 mL | OROMUCOSAL | Status: DC
  Administered 2018-10-03 – 2018-10-11 (×80): 15 mL via OROMUCOSAL

## 2018-10-03 MED ORDER — SODIUM CHLORIDE 0.9 % IV SOLN
250.0000 mL | INTRAVENOUS | Status: DC
  Administered 2018-10-09 (×2): 250 mL via INTRAVENOUS

## 2018-10-03 MED ORDER — NOREPINEPHRINE 4 MG/250ML-% IV SOLN
2.0000 ug/min | INTRAVENOUS | Status: DC
  Administered 2018-10-03: 2 ug/min via INTRAVENOUS
  Filled 2018-10-03: qty 250

## 2018-10-03 MED ORDER — SENNOSIDES 8.8 MG/5ML PO SYRP
5.0000 mL | ORAL_SOLUTION | Freq: Two times a day (BID) | ORAL | Status: DC | PRN
  Administered 2018-10-03 – 2018-10-07 (×2): 5 mL
  Filled 2018-10-03 (×2): qty 5

## 2018-10-03 MED ORDER — MIDAZOLAM HCL 2 MG/2ML IJ SOLN
2.0000 mg | INTRAMUSCULAR | Status: DC | PRN
  Administered 2018-10-05 – 2018-10-10 (×9): 2 mg via INTRAVENOUS
  Filled 2018-10-03 (×9): qty 2

## 2018-10-03 MED ORDER — CARVEDILOL 3.125 MG PO TABS
6.2500 mg | ORAL_TABLET | Freq: Two times a day (BID) | ORAL | Status: DC
  Administered 2018-10-03 – 2018-10-10 (×15): 6.25 mg
  Filled 2018-10-03 (×15): qty 2

## 2018-10-03 MED ORDER — BETHANECHOL CHLORIDE 10 MG PO TABS
10.0000 mg | ORAL_TABLET | Freq: Three times a day (TID) | ORAL | Status: DC
  Administered 2018-10-03 – 2018-10-10 (×23): 10 mg via ORAL
  Filled 2018-10-03 (×23): qty 1

## 2018-10-03 MED ORDER — MIDAZOLAM HCL 2 MG/2ML IJ SOLN
2.0000 mg | INTRAMUSCULAR | Status: AC | PRN
  Administered 2018-10-05 – 2018-10-06 (×3): 2 mg via INTRAVENOUS
  Filled 2018-10-03 (×3): qty 2

## 2018-10-03 MED ORDER — FENTANYL CITRATE (PF) 100 MCG/2ML IJ SOLN
50.0000 ug | INTRAMUSCULAR | Status: DC | PRN
  Administered 2018-10-03 – 2018-10-04 (×4): 50 ug via INTRAVENOUS
  Administered 2018-10-05 (×2): 100 ug via INTRAVENOUS
  Administered 2018-10-05: 22:00:00 200 ug via INTRAVENOUS
  Administered 2018-10-05 – 2018-10-06 (×7): 100 ug via INTRAVENOUS
  Administered 2018-10-06: 50 ug via INTRAVENOUS
  Administered 2018-10-06 – 2018-10-11 (×13): 100 ug via INTRAVENOUS
  Filled 2018-10-03: qty 4
  Filled 2018-10-03 (×20): qty 2
  Filled 2018-10-03: qty 4
  Filled 2018-10-03 (×5): qty 2

## 2018-10-03 MED ORDER — AMIODARONE LOAD VIA INFUSION: 150.0000 mg | Freq: Once | INTRAVENOUS | Status: DC

## 2018-10-03 MED ORDER — GLUCERNA 1.5 CAL PO LIQD
237.0000 mL | Freq: Every day | ORAL | Status: DC
  Administered 2018-10-03 – 2018-10-11 (×38): 237 mL
  Filled 2018-10-03 (×43): qty 237

## 2018-10-03 MED ORDER — FENTANYL CITRATE (PF) 100 MCG/2ML IJ SOLN: 50.0000 ug | INTRAMUSCULAR | Status: DC | PRN

## 2018-10-03 MED ORDER — NOREPINEPHRINE 4 MG/250ML-% IV SOLN: 0.0000 ug/min | INTRAVENOUS | Status: DC

## 2018-10-03 NOTE — TOC Initial Note (Addendum)
Transition of Care [facility]) - Initial/Assessment Note    Patient Details  Name: Johnathan Archer MRN: [id number] Date of Birth: 1971/06/02  Transition of [facility]) CM/SW Contact:    Glennon Mac, RN Phone Number: 10/03/2018, 3:10 PM  Clinical Narrative:  Pt admitted on 10/15/2018 with Lt side paralysis and Rt gaze; found to have acute Rt MCA M1 occlusion.  Pt intubated and taken to IR for revascularization.  He was extubated on 4/10; reintubated on 4/12 for airway compromise.  Pt extubated again 10/01/18 and then reintubated 4/15 for possible aspiration event. Wife has chosen to proceed with tracheostomy and PEG placement.  Will follow as pt progresses.                 Expected Discharge Plan: IP Rehab Facility Barriers to Discharge: Continued Medical Work up          Expected Discharge Plan and Services Expected Discharge Plan: IP Rehab Facility                                  Prior Living Arrangements/Services   Lives with:: Spouse Patient language and need for interpreter reviewed:: Yes        Need for Family Participation in Patient Care: Yes (Comment) Care giver support system in place?: Yes (comment)   Criminal Activity/Legal Involvement Pertinent to Current Situation/Hospitalization: No - Comment as needed            Emotional Assessment   Attitude/Demeanor/Rapport: Unable to Assess(Intubated) Affect (typically observed): Unable to Assess(Intubated) Orientation: : (Unable to assess currently) Alcohol / Substance Use: Not Applicable Psych Involvement: No (comment)  Admission diagnosis:  Stroke [facility]) [I63.9] Stroke (cerebrum) [facility]) [I63.9] Patient Active Problem List   Diagnosis Date Noted  . Acute on chronic respiratory failure with hypoxia and hypercapnia (HCC) 10/03/2018  . Aspiration into respiratory tract   . Endotracheal tube present   . HCAP (healthcare-associated pneumonia) 10/02/2018  . Acute hypernatremia 10/02/2018  . Essential  hypertension 10/02/2018  . Ischemic stroke (HCC) 10/05/2018  . Middle cerebral artery embolism, right 10/03/2018  . Hypertensive heart disease with heart failure (HCC) 03/28/2016  . Normocytic anemia 03/15/2016  . Hyperthyroidism 03/15/2016  . Chronic combined systolic and diastolic CHF (congestive heart failure) (HCC)   . History of noncompliance with medical treatment 03/06/2015  . Chest pain, atypical 03/06/2015  . Obesity (BMI 30-39.9) 01/21/2014  . Atrial fibrillation with rapid ventricular response (HCC) 07/05/2013  . Long term current use of anticoagulant therapy 01/14/2013  . Nonischemic cardiomyopathy (HCC) 12/27/2012  . Paroxysmal atrial fibrillation (HCC) 10/19/2011  . ED (erectile dysfunction)   . Type II diabetes mellitus, uncontrolled (HCC)   . OSA (obstructive sleep apnea)    PCP:  Caroll Rancher, NP Pharmacy:   [facility] [address], [place] - [address] [place] [address] Phone: [phone] Fax: [phone]     Social Determinants of Health (SDOH) Interventions    Readmission Risk Interventions No flowsheet data found.   Quintella Baton, RN, BSN  Trauma/Neuro ICU Case Manager [phone]

## 2018-10-03 NOTE — Progress Notes (Signed)
Urecholine started today for urinary retention 

## 2018-10-03 NOTE — Progress Notes (Signed)
PT Cancellation Note  Patient Details Name: Johnathan Archer MRN: [id number] DOB: 1970/12/26   Cancelled Treatment:    Reason Eval/Treat Not Completed: Patient not medically ready (reintubated)  Laurina Bustle, PT, DPT Acute Rehabilitation Services Pager [phone] Office [phone]    Vanetta Mulders 10/03/2018, 1:29 PM

## 2018-10-03 NOTE — Progress Notes (Signed)
NAME:  Johnathan Archer, MRN:  [id number], DOB:  1971-04-26, LOS: 6 ADMISSION DATE:  10/10/2018, CONSULTATION DATE:  10/15/2018 REFERRING MD:  Dr. Loni Beckwith, CHIEF COMPLAINT:  CVA  Brief History   11 yoM, LSW 1000 am 4/9, presented with left sided paralysis and right gaze found to have acute right MCA M1 occlusion.  Difficult intubation for airway protection.  Taken for EVR with successful revascularization.    History of present illness   HPI obtained from medical chart review as patient is intubated/ sedated   48 year old male with history of Afib on xarelto, chronic systolic / dystolic HF, DMT2, HTN, MR, NICM, anemia, obesity, and OSA, last seen well around 1000 by coworkers found wedged between sink and bathroom stall at work.  Presented as code stroke with left sided paralysis and right gaze.  Hypertensive at 221/ 145.  Found on CT to have acute right MCA M1 occlusion.  Intubated 4/9  for airway protection, was a difficult intubation.  Taken to IR with successful revascularization of right MCA M1 occlusion achieving TICI 2b revascularization.  Returns to ICU, PCCM to consult for ventilator management.   Called back am 4/15 due to arrest in CT with ? Aspiration   Past Medical History  Afib on xarelto, chronic systolic / dystolic HF, DMT2, HTN, MR, NICM, anemia, obesity, OSA, ED   Significant Hospital Events   4/9 Admitted / EVR 4/10 Extubated, started on 3% saline 4/11 Remains on Cleviprex, Failed swallowing evaluation 4/10, left n.p.o. with gastric tube in place, fevers 4/12 Intubated early am for airway compromise/ repeat MRI, rising Na, 3% held, pan-cultured and started on abx  4/13  Re-extubated  4/15 reintubated p ? Asp event in Ct > for peg/trach  Consults:  Neurology   Procedures:  4/9 ETT >>4/10; 4/12 >> 4/13  4/9 R radial aline >> 4/9 cerebral angiogram  Re et  4/14  Significant Diagnostic Tests:  4/9 CTH >> 1. Hyperdense right MCA compatible with acute embolus.  Early infarct in the right basal ganglia and insula 2. 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 Poor flow in right MCA branches. Small amount of clot in right A1 segment. Findings compatible with acute embolus. No significant atherosclerotic disease in the neck or head.  4/9 CTH post intervention >> Hyperdensity within the right basal ganglia may indicate contrast staining or petechial hemorrhage. No significant mass effect.  4/10 MRI brain >> evolving right MCA M1 stroke, culprit vessel is patent.  Some petechial changes without overt hemorrhagic conversion, no shift  Echocardiogram 4/10 >> normal LV function 60-65%, mildly increased LV wall thickness without diastolic dysfunction, normal RV function, no evidence of shunt by bubble study,  4/12 MRI Brain >> continued interval evolution large right MCA CVA, scattered petechial hemorrhage slightly increased, increased gyral swelling with edema and regional mass-effect.  Right to left shift increased from 4 mm to 6 mm.  No hydrocephalus.  A few new subcentimeter acute left cerebral infarcts  Micro Data:  4/9 MRSA PCR >> neg  4/12 BC x 2 >> 4/12 UC >> neg  4/12 trach asp >> mod gpc, few gnr >>> nl flora  4/15 Trach asp >>> mod wbc, few gpc >> 4/15 Resp panel by PCR >  Neg  4/15 Covid-19  Neg   Antimicrobials:  4/9 ancef preop Unaysn 4/12 (?  Asp) >>>   Interim history/subjective:  Does respond to verbal   Objective   Blood pressure 104/74, pulse 99, temperature (!) 101.9 F (38.8 C), temperature source Axillary, resp. rate (!) 24, height 6' (1.829 m), weight 102 kg, SpO2 100 %.  RA    Vent Mode: PRVC FiO2 (%):  [80 %-100 %] 80 % Set Rate:  [18 bmp-24 bmp] 24 bmp Vt Set:  [620 mL] 620 mL PEEP:  [5 cmH20-8 cmH20] 8 cmH20 Plateau Pressure:  [21 cmH20-23 cmH20] 21 cmH20   Intake/Output Summary (Last 24  hours) at 10/03/2018 1106 Last data filed at 10/03/2018 1100 Gross per 24 hour  Intake 1673.79 ml  Output 1320 ml  Net 353.79 ml   Filed Weights   09/30/18 0630 10/02/18 1954 10/03/18 0436  Weight: 97 kg 95.6 kg 102 kg   Examination: Pt intubated, does resp to verbal but getting bolus fentanyl now on vent for sedation  No jvd Oropharynx et in place Neck supple Lungs with min rhonchi bilaterally RRR no s3 or or sign murmur Abd obese / sof with nl excursion  Ext warm with no edema or clubbing noted      I personally reviewed images and agree with radiology impression as follows:  pCXR:   4/14 p arrest  Et ok ? R LL as dz?   Resolved Hospital Problem list    Assessment & Plan:  Right MCA M1 occlusion s/p EVR with successful revascularization P:  Plans per stroke team and interventional radiology, suspect the degree of his neurological injury impacting airway protection> needs trach / peg  Hypertonic saline held 4/12, ongoing free water 300 ml q 3hr D5W increased to 50 cc/h 4/14  But no much change so increased to 100 cc/h 4/15  SBP goal < 180   Acute respiratory failure, impaired airway protection Difficult airway OSA Resume full vent support / needs peg Janina Mayo before weaning    Dysphasia secondary to CVA  Continue tube feeding as tol     Acute renal failure, consider hypertensive, contrast media.   Lab Results  Component Value Date   CREATININE 2.22 (H) 10/03/2018   CREATININE 1.75 (H) 10/02/2018   CREATININE 1.67 (H) 10/01/2018   CREATININE 1.20 05/18/2016   CREATININE 1.01 04/25/2016   CREATININE 1.05 03/15/2016    Minimal  rising sCr, good UOP still Increased d5@ to 100 cc /h 4/15 and ernesto held   Likely RLL aspiration  Fever- improving  P:  Tylenol PRN Continue unasyn as per above flow sheet  Follow culture data w/a   Afib on xarelto P:  Xarelto on hold, defer to neurology as to when this can be restarted in light of the petechial changes on his  MRI brain 4/10 and 4/12 Amiodarone restarted 4/11  Chronic systolic/ diastolic HF - 2/20 EF 55%, normal RV, mod MR, mild dilation of aortic root.  Echocardiogram 4/11 with improved LV function and no evidence of impaired LV relaxation as above P:  Restarted home carvedilol at lower dose 4/11, Entresto on 4/12 but held 4/15 due to creat up   Hypertension, UDS negative P:  SBP goals < 180  = adequately rx'd  Continue coreg 25 mg BID, ASA, hydralazine 50mg  BID   Prn labetalol/ apresoline  DMT2 P:  Sliding-scale insulin - resistant scale decrease to mod given episode of hypoglycemia lantus 18 units daily - was held 4/14 but now on d52 100 cc/h so resumed  Home metformin and Tresiba insulin are on hold  Hx normocytic anemia   Lab Results  Component Value Date   HGB 10.2 (L) 10/03/2018   HGB 10.2 (L) 10/02/2018   HGB 10.6 (L) 10/01/2018    P:  Follow CBC  Best practice:  Diet: NPO, TF Pain/Anxiety/Delirium protocol (if indicated): sedate prn fentanyl VAP protocol (if indicated): no longer indcated  DVT prophylaxis: SCDs/heparin sq  GI prophylaxis: PPI Glucose control: SSI resistant / lantus Mobility: BR Code Status: Full Family Communication: none at bedside Disposition: keep in NICU/ needs trach/peg per [facility]   CBC: Recent Labs  Lab Oct 25, 2018 1248  09/28/18 0224  09/29/18 0320  09/30/18 0428 09/30/18 5993 10/01/18 0712 10/02/18 0557 10/03/18 0028  WBC 7.3  --  9.7  --  7.3  --  8.2  --  7.6 6.7  --   NEUTROABS 5.8  --  8.1*  --   --   --   --   --   --   --   --   HGB 12.5*   < > 11.9*   < > 11.2*   < > 12.0* 11.2* 10.6* 10.2* 10.2*  HCT 38.7*   < > 37.0*   < > 36.8*   < > 41.6 33.0* 36.0* 34.6* 30.0*  MCV 97.7  --  97.1  --  101.7*  --  105.3*  --  106.5* 105.5*  --   PLT 241  --  187  --  176  --  149*  --  139* 151  --    < > = values in this interval not displayed.    Basic Metabolic Panel: Recent Labs  Lab 09/28/18 0224  09/29/18 0320  09/30/18  0428 09/30/18 5701  10/01/18 7793  10/01/18 1830 10/01/18 2356 10/02/18 0557 10/03/18 0028 10/03/18 0630  NA 139   < > 154*   < > 163* 167*   < > 165*   < > 159* 158* 160* 158* 159*  K 3.9   < > 3.9   < > 4.0 3.5  --  3.6  --   --   --  3.8 4.0 4.2  CL 107  --  122*  --  129*  --   --  129*  --   --   --  124*  --  121*  CO2 20*  --  22  --  25  --   --  28  --   --   --  27  --  25  GLUCOSE 186*  --  186*  --  298*  --   --  74  --   --   --  204*  --  251*  BUN 25*  --  25*  --  23*  --   --  30*  --   --   --  32*  --  36*  CREATININE 1.51*  --  1.53*  --  1.57*  --   --  1.67*  --   --   --  1.75*  --  2.22*  CALCIUM 8.4*  --  8.3*  --  8.7*  --   --  8.8*  --   --   --  8.6*  --  8.4*  MG 1.8  --   --   --   --   --   --   --   --   --   --  2.4  --   --   PHOS  3.3  --   --   --   --   --   --   --   --   --   --  3.2  --  5.1*   < > = values in this interval not displayed.   GFR: Estimated Creatinine Clearance: 50.3 mL/min (A) (by C-G formula based on SCr of 2.22 mg/dL (H)). Recent Labs  Lab 09/29/18 0320 09/30/18 0428 10/01/18 0712 10/02/18 0557  WBC 7.3 8.2 7.6 6.7    Liver Function Tests: Recent Labs  Lab 10/18/2018 1248  AST 19  ALT 22  ALKPHOS 50  BILITOT 1.7*  PROT 7.3  ALBUMIN 3.3*   No results for input(s): LIPASE, AMYLASE in the last 168 hours. No results for input(s): AMMONIA in the last 168 hours.  ABG    Component Value Date/Time   PHART 7.258 (L) 10/03/2018 0028   PCO2ART 66.1 (HH) 10/03/2018 0028   PO2ART 53.0 (L) 10/03/2018 0028   HCO3 29.6 (H) 10/03/2018 0028   TCO2 32 10/03/2018 0028   ACIDBASEDEF 2.0 09/30/2018 0624   O2SAT 81.0 10/03/2018 0028     Coagulation Profile: Recent Labs  Lab 10-18-2018 1248  INR 2.6*    Cardiac Enzymes: No results for input(s): CKTOTAL, CKMB, CKMBINDEX, TROPONINI in the last 168 hours.  HbA1C: Hgb A1c MFr Bld  Date/Time Value Ref Range Status  09/28/2018 02:24 AM 8.9 (H) 4.8 - 5.6 % Final     Comment:    (NOTE) Pre diabetes:          5.7%-6.4% Diabetes:              >6.4% Glycemic control for   <7.0% adults with diabetes   03/11/2017 07:41 PM 8.0 (H) 4.8 - 5.6 % Final    Comment:    (NOTE) Pre diabetes:          5.7%-6.4% Diabetes:              >6.4% Glycemic control for   <7.0% adults with diabetes     CBG: Recent Labs  Lab 10/02/18 1531 10/02/18 1951 10/02/18 2335 10/03/18 0428 10/03/18 0756  GLUCAP 123* 112* 249* 201* 177*     The patient is critically ill with multiple organ systems failure and requires high complexity decision making for assessment and support, frequent evaluation and titration of therapies, application of advanced monitoring technologies and extensive interpretation of multiple databases. Critical Care Time devoted to patient care services described in this note is 45 minutes.    Sandrea Hughs, MD Pulmonary and Critical Care Medicine [facility] Cell [phone] After 5:30 PM or weekends, use Beeper [phone]

## 2018-10-03 NOTE — Progress Notes (Signed)
Nutrition Follow-up  DOCUMENTATION CODES:   Not applicable  INTERVENTION:   Increase bolus feedings to 1 container of Glucerna 1.5 five times daily via Cortrak (have messaged Pharmacy) Continue 30 ml Prostat TID  Provides: 2080 kcal (91% of needs), 143 grams protein, and 900 ml free water.  Total free water: 3300 ml   NUTRITION DIAGNOSIS:   Inadequate oral intake related to inability to eat as evidenced by NPO status. Ongoing  GOAL:   Patient will meet greater than or equal to 90% of their needs Met.   MONITOR:   TF tolerance, Diet advancement  REASON FOR ASSESSMENT:   Consult Enteral/tube feeding initiation and management  ASSESSMENT:   Pt with PMH of Afib on xarelto, chronic systolic/dystolic HF, DM, HTN, MR, anemia, obesity admitted with R MCA s/p IR for revascularization.    4/10 Cortrak placed 4/11 intubated 4/13 extubated  4/15 pt with aspiration event (secretions), arrested in CT, now re-intubated   Per SLP note pt was not managing his secretions prior to intubation.   Patient is currently intubated on ventilator support MV: 14.8 L/min Temp (24hrs), Avg:99.3 F (37.4 C), Min:97.1 F (36.2 C), Max:101.9 F (38.8 C)   Medications reviewed and include:  300 ml free water every 3 hours = 2400 ml  Moderate sliding scale  6 units novolog QID 18 units Lantus HS  Labs reviewed: Na 159 (H) decreasing with free water; PO4: 5.1 (H) Lab Results  Component Value Date   HGBA1C 8.9 (H) 09/28/2018  CBGs: 177-128   NUTRITION - FOCUSED PHYSICAL EXAM:  Deferred   Diet Order:   Diet Order            Diet NPO time specified  Diet effective now              EDUCATION NEEDS:   Not appropriate for education at this time  Skin:  Skin Assessment: (R groin incision)  Last BM:  4/14  Height:   Ht Readings from Last 1 Encounters:  09/30/18 6' (1.829 m)    Weight:   Wt Readings from Last 1 Encounters:  10/03/18 102 kg    Ideal Body Weight:      BMI:  Body mass index is 30.5 kg/m.  Estimated Nutritional Needs:   Kcal:  1700-1900  Protein:  95-110 grams  Fluid:  >1.7 L/day  Maylon Peppers RD, LDN, CNSC [phone] Pager [phone] After Hours Pager

## 2018-10-03 NOTE — Progress Notes (Addendum)
Pt. Had temp >99.5 which was unaffected by tylenol.  Called neuro MD and obtained orders for cooling blanket.  [employer] alerted me to the fact that they did not have any cooling blanket machines.  Started cooling blanket as soon as Charity fundraiser was able to obtain a cooling blanket and the cooling blanket machine.

## 2018-10-03 NOTE — Progress Notes (Signed)
Patient arrived to the unit from 4 north alert and oriented X 4, He uses Nod/gestures to answer questions. He is very Sleepy but arouses to voice. He follows commands. Denies Pain Pupils 2, round, sluggish Rhonchi lung sounds Skin clean dry and intact Placed on Tele Box #31 Bed in lowest position with bed alarm set. Call light in reach. All questions and concerns addressed Nurse will continue to monitor

## 2018-10-03 NOTE — Progress Notes (Signed)
Assisted tele visit to patient with wife.  Curtis Uriarte Ferrer, RN  

## 2018-10-03 NOTE — Progress Notes (Signed)
20:30 RN went to assess patient and patient oxygen level was low at 91 and patient was very rhonchus, you could hear the patient breathing from the hallway.  I asked RT to check on patient and we both assessed.  Critical care was contacted for order.  Received orders at 21:04 and called rapid for support.  When reentering patients room pt was slumped over with secretions oozing from the corner of his mouth.  See rapid note.  Critical Care doctors and Neuro doctor at bedside decided to send patient for CT scan.  During the CT scan the patient coded at 23:22 see Rapid notes.  Patient was intubated in the ED and stabilized well enough (see code sheet)  to transfer to [facility] ICU.  Report was given.

## 2018-10-03 NOTE — Progress Notes (Signed)
Assisted tele visit to patient with family member.  Pharoah Goggins Anderson, RN   

## 2018-10-03 NOTE — Progress Notes (Signed)
PCCM Brief Interval Note   Patient transferred to NeuroICU after cardiac arrest with ROSC, x2 in CT. See Code Documentation for details. Patient intubated by ED physician.  CT shows  -existing R MCA CVA with stable petechial hemorrhage - increased edema and mass effect, 10 mm R to L midline shift (worse from prior 58mm shift)  -RUL ground glass on CTA neck  On monitor in ICU, pt in Atrial fibrillation with intermittent runs of V Tach  Plan -Full mechanical vent support -Titrate FiO2/PEEP for SpO2 >92% -Given neuro prognostication, consider trach if family does not wish to move toward palliation  -STAT ABG -STAT BMP, mag, phos -Amio bolus -Follow up CXR result -CVA management per neurology  Additional Critical Care Time: 35 minutes    Tessie Fass MSN, AGACNP-BC [place] Pulmonary/Critical Care Medicine [phone] If no answer, [phone] 10/03/2018, 12:30 AM

## 2018-10-03 NOTE — Progress Notes (Signed)
OT Cancellation Note  Patient Details Name: Johnathan Archer MRN: [id number] DOB: 11/25/1970   Cancelled Treatment:    Reason Eval/Treat Not Completed: Medical issues which prohibited therapy.  Events noted.  Pt now intubated. Will check back end of week.    Jeani Hawking, OTR/L Acute Rehabilitation Services Pager [phone] Office [phone]   Jeani Hawking M 10/03/2018, 5:27 AM

## 2018-10-03 NOTE — Consult Note (Signed)
[facility] Surgery Consult Note  Johnathan Archer 01/22/1971  [id number].    Requesting MD: Delia Heady Chief Complaint/Reason for Consult: trach/PEG  HPI:  Johnathan Archer is a 49yo male PMH Afib on xarelto, chronic systolic / dystolic HF (EF 60-65% 09/28/18), DM2, HTN, MR, NICM, anemia, obesity, and OSA, who [facility] an acute [facility]. Per chart patient was brought to the ED when his coworkers found him on the floor in the restroom with left hemiparesis and right gaze deviation. Code stroke was called and CT head demonstrated hyperdense right MCA compatible with acute embolus. Intubated for airway protection. Patient underwent revascularization in IR. He has been extubated twice but required reintubation, most recently 4/14 after cardiac arrest with ROSC. He is currently on unasyn for suspected aspiration pneumonia. Levophed off since this AM but BP 85/60 when I was in the room. Vent setting FiO2 70% with PEEP 8. Trauma asked to see for possible trach/PEG. H/o prior hernia repair.  ROS: Review of Systems  Unable to perform ROS: Intubated    Family History  Problem Relation Age of Onset  . Diabetes Mother   . Hypertension Father   . Stroke Neg Hx   . Coronary artery disease Neg Hx   . Cancer Neg Hx   . Heart attack Neg Hx   . Thyroid disease Neg Hx     Past Medical History:  Diagnosis Date  . Atrial fibrillation with RVR (HCC) 10/2011   lone episode, converted with IV dilt, on coumadin given elevated CHADS2  . Chest pain 03/06/2015   Myoview 07/2018: EF 33, no ischemia (EF normal by recent echo)  . Chronic combined systolic and diastolic CHF (congestive heart failure) (HCC)    Echo 2014: EF 20-25 // Echo 07/2018: mild LVE, EF 55, normal RVSF, mod LAE, mod MR, mild PI, mild aortic root dilation (38 mm)  . ED (erectile dysfunction)   . History of chicken pox   . History of noncompliance with medical treatment 03/06/2015  . Hypertension   .  Hypertensive heart disease with heart failure (HCC) 03/28/2016  . Hyperthyroidism 03/15/2016  . Long term current use of anticoagulant therapy 01/14/2013  . Mitral regurgitation    Moderate by echo 07/2011  . Nonischemic cardiomyopathy (HCC) 12/27/2012  . Normocytic anemia 03/15/2016  . Obesity (BMI 30-39.9) 01/21/2014  . OSA (obstructive sleep apnea)   . Paroxysmal atrial fibrillation (HCC) 10/19/2011   lone episode, converted with IV dilt, on coumadin given elevated CHADS2   . 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  cath 2004 negative for obstruction, last echo 07/2011 EF 35%. b) Normal coronary arteries by cath in 2004 (when EF was noted at 15%). c) Last EF 20% by echo 12/2012  . Type II diabetes mellitus, uncontrolled (HCC)   . Type II or unspecified type diabetes mellitus without mention of complication, uncontrolled   . Warfarin anticoagulation     Past Surgical History:  Procedure Laterality Date  . CARDIAC CATHETERIZATION  2004   no blockages  . HERNIA REPAIR  2002  . IR ANGIO VERTEBRAL SEL SUBCLAVIAN INNOMINATE UNI R MOD SED  10/20/2018  . IR CT HEAD LTD  10-20-18  . IR PERCUTANEOUS ART THROMBECTOMY/INFUSION INTRACRANIAL INC DIAG ANGIO  2018-10-20  . KNEE SURGERY     L knee in HS  . RADIOLOGY WITH ANESTHESIA [name] 10-20-2018   Procedure: IR WITH ANESTHESIA;  Surgeon: Julieanne Cotton, MD;  Location: [facility] OR;  Service: Radiology;  Laterality: [name];  . US ECHOCARDIOGRAPHY  07/2011   mod LVH, EF 35%, diffuse hypokinesis of entire myocardium, irreversible restrictive pattern (grade 4 diastolic dysfunction), mod MR, mod dilated LA/RA, decreased RV systolic fxn, no PFO  . US ECHOCARDIOGRAPHY  12/2012   severe LVH, EF 20-25%, mod MR, reduced RV systolic function    Social History:  reports that he has never smoked. He has never used smokeless tobacco. He reports that he does not drink alcohol or use drugs.  Allergies: No  Known Allergies  Medications Prior to Admission  Medication Sig Dispense Refill  . amiodarone (PACERONE) 200 MG tablet Take 1 tablet (200 mg total) by mouth daily. 30 tablet 0  . carvedilol (COREG) 25 MG tablet Take 1.5 tablets (37.5 mg total) by mouth 2 (two) times daily with a meal. 135 tablet 3  . fexofenadine (ALLEGRA) 180 MG tablet Take 180 mg by mouth daily as needed for allergies or rhinitis.    . furosemide (LASIX) 40 MG tablet Take 1.5 tablets (60 mg total) by mouth 2 (two) times daily. 180 tablet 3  . hydrALAZINE (APRESOLINE) 50 MG tablet Take 1 tablet (50 mg total) by mouth 2 (two) times daily. 90 tablet 3  . Insulin Glargine, 1 Unit Dial, (TOUJEO SOLOSTAR) 300 UNIT/ML SOPN Inject into the skin.    Marland Kitchen ipratropium (ATROVENT) 0.03 % nasal spray Place 2 sprays into both nostrils 2 (two) times daily.     . isosorbide mononitrate (IMDUR) 30 MG 24 hr tablet Take 1 tablet (30 mg total) by mouth daily. 90 tablet 3  . metFORMIN (GLUCOPHAGE) 500 MG tablet Take 1 tablet (500 mg total) by mouth 2 (two) times daily with a meal. (Patient taking differently: Take 750 mg by mouth 2 (two) times daily with a meal. ) 60 tablet 0  . sacubitril-valsartan (ENTRESTO) 49-51 MG Take 1 tablet by mouth 2 (two) times daily. 60 tablet 11  . spironolactone (ALDACTONE) 25 MG tablet Take 1 tablet (25 mg total) by mouth daily. 90 tablet 3  . XARELTO 20 MG TABS tablet TAKE 1 TABLET BY MOUTH ONCE DAILY WITH SUPPER (Patient taking differently: Take 20 mg by mouth daily with breakfast. ) 90 tablet 0  . glucose blood test strip 1 each by Other route as needed for other. Check cbg in morning and 2 hours after largest meal of day.    . Insulin Pen Needle 31G X 6 MM MISC by Does not apply route. Use for daily insulin injection.      Prior to Admission medications   Medication Sig Start Date End Date Taking? Authorizing Provider  amiodarone (PACERONE) 200 MG tablet Take 1 tablet (200 mg total) by mouth daily. 07/25/18  Yes  Weaver, Scott T, PA-C  carvedilol (COREG) 25 MG tablet Take 1.5 tablets (37.5 mg total) by mouth 2 (two) times daily with a meal. 08/21/18  Yes Weaver, Scott T, PA-C  fexofenadine (ALLEGRA) 180 MG tablet Take 180 mg by mouth daily as needed for allergies or rhinitis.   Yes [provider]  furosemide (LASIX) 40 MG tablet Take 1.5 tablets (60 mg total) by mouth 2 (two) times daily. 08/08/18 11/06/18 Yes Weaver, Scott T, PA-C  hydrALAZINE (APRESOLINE) 50 MG tablet Take 1 tablet (50 mg total) by mouth 2 (two) times daily. 09/17/18  Yes Weaver, Scott T, PA-C  Insulin Glargine, 1 Unit Dial, (TOUJEO SOLOSTAR) 300 UNIT/ML SOPN Inject into the skin.  Yes [provider]  ipratropium (ATROVENT) 0.03 % nasal spray Place 2 sprays into both nostrils 2 (two) times daily.  08/30/18  Yes [provider]  isosorbide mononitrate (IMDUR) 30 MG 24 hr tablet Take 1 tablet (30 mg total) by mouth daily. 08/21/18  Yes Weaver, Scott T, PA-C  metFORMIN (GLUCOPHAGE) 500 MG tablet Take 1 tablet (500 mg total) by mouth 2 (two) times daily with a meal. Patient taking differently: Take 750 mg by mouth 2 (two) times daily with a meal.  10/27/15  Yes Rose, Kayla, PA-C  sacubitril-valsartan (ENTRESTO) 49-51 MG Take 1 tablet by mouth 2 (two) times daily. 08/08/18  Yes Tereso NewcomerWeaver, Scott T, PA-C  spironolactone (ALDACTONE) 25 MG tablet Take 1 tablet (25 mg total) by mouth daily. 08/23/18  Yes Weaver, Scott T, PA-C  XARELTO 20 MG TABS tablet TAKE 1 TABLET BY MOUTH ONCE DAILY WITH SUPPER Patient taking differently: Take 20 mg by mouth daily with breakfast.  07/25/18  Yes Weaver, Scott T, PA-C  glucose blood test strip 1 each by Other route as needed for other. Check cbg in morning and 2 hours after largest meal of day.    [provider]  Insulin Pen Needle 31G X 6 MM MISC by Does not apply route. Use for daily insulin injection.    [provider]   Vent settings: Vent Mode: PRVC FiO2 (%):  [60 %-100 %] 60  % Set Rate:  [18 bmp-24 bmp] 24 bmp Vt Set:  [620 mL] 620 mL PEEP:  [5 cmH20-8 cmH20] 8 cmH20 Plateau Pressure:  [21 cmH20-23 cmH20] 21 cmH20   Blood pressure 104/74, pulse 99, temperature (!) 101.9 F (38.8 C), temperature source Axillary, resp. rate (!) 24, height 6' (1.829 m), weight 102 kg, SpO2 98 %. Physical Exam: General: WD/WN AA male who is laying in bed in NAD on the ventilator HEENT: head is normocephalic, atraumatic.  Sclera are noninjected.  Pupils equal and round.  Ears and nose without any masses or lesions.  ETT in place. COrtrak in nare. Mouth is dry. Dentition fair Heart: regular, rate, and rhythm.  No obvious murmurs, gallops, or rubs noted.  Peripheral pulses weak Lungs: trace bilateral rhonchi, no wheezes or rales noted.  Mechanically ventilated Abd: soft, NT/ND, +BS, no masses or organomegaly. Soft/reducible midline ventral hernia with about 1cm defect MS: trace BUE/BLE edema. Calves soft and nontender Skin: warm and dry with no masses, lesions, or rashes Psych: comatose  Results for orders placed or performed during the hospital encounter of Oct 08, 2018 (from the past 48 hour(s))  Glucose, capillary     Status: Abnormal   Collection Time: 10/01/18 12:08 PM  Result Value Ref Range   Glucose-Capillary 136 (H) 70 - 99 mg/dL  Glucose, capillary     Status: Abnormal   Collection Time: 10/01/18  3:52 PM  Result Value Ref Range   Glucose-Capillary 255 (H) 70 - 99 mg/dL   Comment 1 Notify RN    Comment 2 Document in Chart   Sodium     Status: Abnormal   Collection Time: 10/01/18  6:30 PM  Result Value Ref Range   Sodium 159 (H) 135 - 145 mmol/L    Comment: Performed at [facility], [address] [place], [place] [id number]  Glucose, capillary     Status: Abnormal   Collection Time: 10/01/18  8:05 PM  Result Value Ref Range   Glucose-Capillary 211 (H) 70 - 99 mg/dL  Sodium  Status: Abnormal   Collection Time: 10/01/18 11:56 PM  Result Value Ref Range    Sodium 158 (H) 135 - 145 mmol/L    Comment: Performed at [facility], [address], [place], [place] [address]  Glucose, capillary     Status: Abnormal   Collection Time: 10/01/18 11:59 PM  Result Value Ref Range   Glucose-Capillary 158 (H) 70 - 99 mg/dL  Glucose, capillary     Status: Abnormal   Collection Time: 10/02/18  3:35 AM  Result Value Ref Range   Glucose-Capillary 156 (H) 70 - 99 mg/dL  CBC     Status: Abnormal   Collection Time: 10/02/18  5:57 AM  Result Value Ref Range   WBC 6.7 4.0 - 10.5 K/uL   RBC 3.28 (L) 4.22 - 5.81 MIL/uL   Hemoglobin 10.2 (L) 13.0 - 17.0 g/dL   HCT 60.4 (L) 54.0 - 98.1 %   MCV 105.5 (H) 80.0 - 100.0 fL   MCH 31.1 26.0 - 34.0 pg   MCHC 29.5 (L) 30.0 - 36.0 g/dL   RDW 19.1 47.8 - 29.5 %   Platelets 151 150 - 400 K/uL    Comment: REPEATED TO VERIFY   nRBC 0.0 0.0 - 0.2 %    Comment: Performed at [facility], [address], [place], [place] [address]  Basic metabolic panel     Status: Abnormal   Collection Time: 10/02/18  5:57 AM  Result Value Ref Range   Sodium 160 (H) 135 - 145 mmol/L   Potassium 3.8 3.5 - 5.1 mmol/L   Chloride 124 (H) 98 - 111 mmol/L   CO2 27 22 - 32 mmol/L   Glucose, Bld 204 (H) 70 - 99 mg/dL   BUN 32 (H) 6 - 20 mg/dL   Creatinine, Ser 8.65 (H) 0.61 - 1.24 mg/dL   Calcium 8.6 (L) 8.9 - 10.3 mg/dL   GFR calc non Af Amer 45 (L) >60 mL/min   GFR calc Af Amer 52 (L) >60 mL/min   Anion gap 9 5 - 15    Comment: Performed at [facility], [address], [place], [place] [address]  Magnesium     Status: None   Collection Time: 10/02/18  5:57 AM  Result Value Ref Range   Magnesium 2.4 1.7 - 2.4 mg/dL    Comment: Performed at [facility], [address], [place], [place] [address]  Phosphorus     Status: None   Collection Time: 10/02/18  5:57 AM  Result Value Ref Range   Phosphorus 3.2 2.5 - 4.6 mg/dL    Comment: Performed at [facility], [address] [place], [place] [address]   Glucose, capillary     Status: Abnormal   Collection Time: 10/02/18  8:19 AM  Result Value Ref Range   Glucose-Capillary 119 (H) 70 - 99 mg/dL   Comment 1 Notify RN    Comment 2 Document in Chart   Glucose, capillary     Status: Abnormal   Collection Time: 10/02/18 11:52 AM  Result Value Ref Range   Glucose-Capillary 130 (H) 70 - 99 mg/dL   Comment 1 Notify RN    Comment 2 Document in Chart   Glucose, capillary     Status: Abnormal   Collection Time: 10/02/18  3:31 PM  Result Value Ref Range   Glucose-Capillary 123 (H) 70 - 99 mg/dL  Glucose, capillary     Status: Abnormal   Collection  Time: 10/02/18  7:51 PM  Result Value Ref Range   Glucose-Capillary 112 (H) 70 - 99 mg/dL  Blood gas, arterial     Status: Abnormal   Collection Time: 10/02/18  9:38 PM  Result Value Ref Range   O2 Content 2.0 L/min   Delivery systems NASAL CANNULA    pH, Arterial 7.406 7.350 - 7.450   pCO2 arterial 45.1 32.0 - 48.0 mmHg   pO2, Arterial 84.2 83.0 - 108.0 mmHg   Bicarbonate 27.9 20.0 - 28.0 mmol/L   Acid-Base Excess 3.4 (H) 0.0 - 2.0 mmol/L   O2 Saturation 96.2 %   Patient temperature 98.1    Collection site RADIAL    Drawn by [id number]    Sample type ARTERIAL DRAW    Allens test (pass/fail) PASS PASS  Glucose, capillary     Status: Abnormal   Collection Time: 10/02/18 11:35 PM  Result Value Ref Range   Glucose-Capillary 249 (H) 70 - 99 mg/dL  I-STAT 7, (LYTES, BLD GAS, ICA, H+H)     Status: Abnormal   Collection Time: 10/03/18 12:28 AM  Result Value Ref Range   pH, Arterial 7.258 (L) 7.350 - 7.450   pCO2 arterial 66.1 (HH) 32.0 - 48.0 mmHg   pO2, Arterial 53.0 (L) 83.0 - 108.0 mmHg   Bicarbonate 29.6 (H) 20.0 - 28.0 mmol/L   TCO2 32 22 - 32 mmol/L   O2 Saturation 81.0 %   Acid-Base Excess 1.0 0.0 - 2.0 mmol/L   Sodium 158 (H) 135 - 145 mmol/L   Potassium 4.0 3.5 - 5.1 mmol/L   Calcium, Ion 1.18 1.15 - 1.40 mmol/L   HCT 30.0 (L) 39.0 - 52.0 %   Hemoglobin 10.2 (L) 13.0 - 17.0 g/dL    Patient temperature 98.1 F    Collection site RADIAL, ALLEN'S TEST ACCEPTABLE    Drawn by RT    Sample type ARTERIAL    Comment NOTIFIED PHYSICIAN   Culture, respiratory (non-expectorated)     Status: None (Preliminary result)   Collection Time: 10/03/18 12:44 AM  Result Value Ref Range   Specimen Description TRACHEAL ASPIRATE    Special Requests NONE    Gram Stain      MODERATE WBC PRESENT, PREDOMINANTLY PMN FEW GRAM POSITIVE COCCI Performed at [facility], [address] [place], [place] [address]    Culture PENDING    Report Status PENDING   Respiratory Panel by PCR     Status: None   Collection Time: 10/03/18  3:39 AM  Result Value Ref Range   Adenovirus NOT DETECTED NOT DETECTED   Coronavirus 229E NOT DETECTED NOT DETECTED    Comment: (NOTE) The Coronavirus on the Respiratory Panel, DOES NOT test for the novel  Coronavirus (2019 nCoV)    Coronavirus HKU1 NOT DETECTED NOT DETECTED   Coronavirus NL63 NOT DETECTED NOT DETECTED   Coronavirus OC43 NOT DETECTED NOT DETECTED   Metapneumovirus NOT DETECTED NOT DETECTED   Rhinovirus / Enterovirus NOT DETECTED NOT DETECTED   Influenza A NOT DETECTED NOT DETECTED   Influenza B NOT DETECTED NOT DETECTED   Parainfluenza Virus 1 NOT DETECTED NOT DETECTED   Parainfluenza Virus 2 NOT DETECTED NOT DETECTED   Parainfluenza Virus 3 NOT DETECTED NOT DETECTED   Parainfluenza Virus 4 NOT DETECTED NOT DETECTED   Respiratory Syncytial Virus NOT DETECTED NOT DETECTED   Bordetella pertussis NOT DETECTED NOT DETECTED   Chlamydophila pneumoniae NOT DETECTED NOT DETECTED   Mycoplasma pneumoniae NOT DETECTED NOT DETECTED  Comment: Performed at [facility], [address], [place], [place] [address]  SARS Coronavirus 2 [facility] order, Performed in [facility] hospital lab)     Status: None   Collection Time: 10/03/18  3:40 AM  Result Value Ref Range   SARS Coronavirus 2 NEGATIVE NEGATIVE    Comment: (NOTE) If result is  NEGATIVE SARS-CoV-2 target nucleic acids are NOT DETECTED. The SARS-CoV-2 RNA is generally detectable in upper and lower  respiratory specimens during the acute phase of infection. The lowest  concentration of SARS-CoV-2 viral copies this assay can detect is 250  copies / mL. A negative result does not preclude SARS-CoV-2 infection  and should not be used as the sole basis for treatment or other  patient management decisions.  A negative result may occur with  improper specimen collection / handling, submission of specimen other  than nasopharyngeal swab, presence of viral mutation(s) within the  areas targeted by this assay, and inadequate number of viral copies  (<250 copies / mL). A negative result must be combined with clinical  observations, patient history, and epidemiological information. If result is POSITIVE SARS-CoV-2 target nucleic acids are DETECTED. The SARS-CoV-2 RNA is generally detectable in upper and lower  respiratory specimens dur ing the acute phase of infection.  Positive  results are indicative of active infection with SARS-CoV-2.  Clinical  correlation with patient history and other diagnostic information is  necessary to determine patient infection status.  Positive results do  not rule out bacterial infection or co-infection with other viruses. If result is PRESUMPTIVE POSTIVE SARS-CoV-2 nucleic acids MAY BE PRESENT.   A presumptive positive result was obtained on the submitted specimen  and confirmed on repeat testing.  While 2019 novel coronavirus  (SARS-CoV-2) nucleic acids may be present in the submitted sample  additional confirmatory testing may be necessary for epidemiological  and / or clinical management purposes  to differentiate between  SARS-CoV-2 and other Sarbecovirus currently known to infect humans.  If clinically indicated additional testing with an alternate test  methodology [phone]) is advised. The SARS-CoV-2 RNA is generally  detectable  in upper and lower respiratory sp ecimens during the acute  phase of infection. The expected result is Negative. Fact Sheet for Patients:  BoilerBrush.com.cy Fact Sheet for Healthcare Providers: https://pope.com/ This test is not yet approved or cleared by the [place] FDA and has been authorized for detection and/or diagnosis of SARS-CoV-2 by FDA under an Emergency Use Authorization (EUA).  This EUA will remain in effect (meaning this test can be used) for the duration of the COVID-19 declaration under Section 564(b)(1) of the Act, 21 U.S.C. section 360bbb-3(b)(1), unless the authorization is terminated or revoked sooner. Performed at [facility], [address] [place], [place] [address]   Glucose, capillary     Status: Abnormal   Collection Time: 10/03/18  4:28 AM  Result Value Ref Range   Glucose-Capillary 201 (H) 70 - 99 mg/dL  ABO/Rh     Status: None   Collection Time: 10/03/18  6:22 AM  Result Value Ref Range   ABO/RH(D)      A POS Performed at [facility], [address] [place], [place] [address]   Type and screen [facility]     Status: None   Collection Time: 10/03/18  6:22 AM  Result Value Ref Range   ABO/RH(D) A POS    Antibody Screen NEG    Sample Expiration  10/06/2018 Performed at [facility], [address] [place], [place] [address]   Basic metabolic panel     Status: Abnormal   Collection Time: 10/03/18  6:30 AM  Result Value Ref Range   Sodium 159 (H) 135 - 145 mmol/L   Potassium 4.2 3.5 - 5.1 mmol/L   Chloride 121 (H) 98 - 111 mmol/L   CO2 25 22 - 32 mmol/L   Glucose, Bld 251 (H) 70 - 99 mg/dL   BUN 36 (H) 6 - 20 mg/dL   Creatinine, Ser 6.12 (H) 0.61 - 1.24 mg/dL   Calcium 8.4 (L) 8.9 - 10.3 mg/dL   GFR calc non Af Amer 34 (L) >60 mL/min   GFR calc Af Amer 39 (L) >60 mL/min   Anion gap 13 5 - 15    Comment: Performed at [facility], [address] [place], [place] [address]  Vitamin B12     Status: None   Collection Time: 10/03/18  6:30 AM  Result Value Ref Range   Vitamin B-12 241 180 - 914 pg/mL    Comment: (NOTE) This assay is not validated for testing neonatal or myeloproliferative syndrome specimens for Vitamin B12 levels. Performed at [facility], [address], [place], [place] [address]   Phosphorus     Status: Abnormal   Collection Time: 10/03/18  6:30 AM  Result Value Ref Range   Phosphorus 5.1 (H) 2.5 - 4.6 mg/dL    Comment: Performed at [facility] Lab, [address], [place], [place] [address]  Glucose, capillary     Status: Abnormal   Collection Time: 10/03/18  7:56 AM  Result Value Ref Range   Glucose-Capillary 177 (H) 70 - 99 mg/dL  Glucose, capillary     Status: Abnormal   Collection Time: 10/03/18 11:42 AM  Result Value Ref Range   Glucose-Capillary 128 (H) 70 - 99 mg/dL   Comment 1 Notify RN    Comment 2 Document in Chart    Ct Angio Head W Or Wo Contrast  Result Date: 10/02/2018 CLINICAL DATA:  48 y/o M; follow-up of stroke post intra-arterial revascularization. EXAM: CTA HEAD WITHOUT CONTRAST CT ANGIOGRAPHY HEAD AND NECK TECHNIQUE: Multidetector CT imaging of the head and neck was performed using the standard protocol during bolus administration of intravenous contrast. Multiplanar CT image reconstructions and MIPs were obtained to evaluate the vascular anatomy. Carotid stenosis measurements (when applicable) are obtained utilizing NASCET criteria, using the distal internal carotid diameter as the denominator. CONTRAST:  OMNIPAQUE IOHEXOL 350 MG/ML SOLN COMPARISON:  09/30/2018 MRI head. 10/27/2018 CTA head and neck. 2018/10/27 cerebral angiogram. 09/28/2018 MRA head. FINDINGS: CT HEAD FINDINGS Brain: Large right MCA distribution infarction involving the right basal ganglia, right insula, right lateral frontal lobe, and right temporal lobe with additional small foci of infarction  throughout the right superior frontal and parietal cortices. The distribution of infarction is stable from prior MRI of the head given differences in technique. Edema and mass effect results in effacement of the right lateral ventricle and 10 mm of right-to-left midline shift. There are faint densities throughout the region of infarction corresponding to petechial hemorrhage on the prior MRI of the head without interval change given differences in technique. No new acute stroke, mass effect, or downward herniation at this time. Additional very small infarctions scattered throughout the cerebral hemispheres seen on the prior MRI are not appreciable on CT. Vascular: As below. Skull: Normal. Negative for fracture or  focal lesion. Sinuses: Mild mucosal thickening of ethmoid, sphenoid, and maxillary sinuses. Normal aeration of the mastoid air cells. Orbits are unremarkable. Orbits: No acute finding. Review of the MIP images confirms the above findings CTA NECK FINDINGS Aortic arch: Standard branching. Imaged portion shows no evidence of aneurysm or dissection. No significant stenosis of the major arch vessel origins. Right carotid system: Motion artifact obscures the carotid bifurcation. No evidence of dissection, stenosis (50% or greater) or occlusion in the visible carotid system. Left carotid system: Motion artifact obscures the carotid bifurcation. No evidence of dissection, stenosis (50% or greater) or occlusion in the visible carotid system. Vertebral arteries: Codominant. Segments of the vertebral arteries are obscured by motion artifact. No evidence of dissection, stenosis (50% or greater) or occlusion of the visible vertebral artery systems. Skeleton: Negative. Other neck: Negative. Upper chest: Increased ground-glass opacities within the right upper lung. Review of the MIP images confirms the above findings CTA HEAD FINDINGS Anterior circulation: No significant stenosis, proximal occlusion, aneurysm, or  vascular malformation. Posterior circulation: No significant stenosis, proximal occlusion, aneurysm, or vascular malformation. Venous sinuses: As permitted by contrast timing, patent. Anatomic variants: None significant. Delayed phase: No abnormal intracranial enhancement. Review of the MIP images confirms the above findings IMPRESSION: CT head: 1. 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 Stable petechial hemorrhage. No new stroke or hemorrhage identified. 2. Increased edema and mass effect with 10 mm right-to-left midline shift and effacement of right lateral ventricle, previously 6 mm right-to-left midline shift. No downward herniation at this time. CTA neck: 1. Patent carotid and vertebral arteries. No dissection, aneurysm, or hemodynamically significant stenosis utilizing NASCET criteria. 2. Increased ground-glass opacification in the upper right lung, suspected neurogenic edema or possibly pneumonitis. CTA head: 1. Patent anterior and posterior intracranial circulation. No new large vessel occlusion, aneurysm, or significant stenosis. Electronically Signed   By: Mitzi Hansen M.D.   On: 10/02/2018 23:41   Ct Head Wo Contrast  Result Date: 10/02/2018 CLINICAL DATA:  48 y/o M; follow-up of stroke post intra-arterial revascularization. EXAM: CTA HEAD WITHOUT CONTRAST CT ANGIOGRAPHY HEAD AND NECK TECHNIQUE: Multidetector CT imaging of the head and neck was performed using the standard protocol during bolus administration of intravenous contrast. Multiplanar CT image reconstructions and MIPs were obtained to evaluate the vascular anatomy. Carotid stenosis measurements (when applicable) are obtained utilizing NASCET criteria, using the distal internal carotid diameter as the denominator. CONTRAST:  OMNIPAQUE IOHEXOL 350  MG/ML SOLN COMPARISON:  09/30/2018 MRI head. October 08, 2018 CTA head and neck. Oct 08, 2018 cerebral angiogram. 09/28/2018 MRA head. FINDINGS: CT HEAD FINDINGS Brain: Large right MCA distribution infarction involving the right basal ganglia, right insula, right lateral frontal lobe, and right temporal lobe with additional small foci of infarction throughout the right superior frontal and parietal cortices. The distribution of infarction is stable from prior MRI of the head given differences in technique. Edema and mass effect results in effacement of the right lateral ventricle and 10 mm of right-to-left midline shift. There are faint densities throughout the region of infarction corresponding to petechial hemorrhage on the prior MRI of the head without interval change given differences in technique. No new acute stroke, mass effect, or downward herniation at this time. Additional very small infarctions scattered throughout the cerebral hemispheres seen on the prior MRI are not appreciable on CT. Vascular: As below. Skull:  Normal. Negative for fracture or focal lesion. Sinuses: Mild mucosal thickening of ethmoid, sphenoid, and maxillary sinuses. Normal aeration of the mastoid air cells. Orbits are unremarkable. Orbits: No acute finding. Review of the MIP images confirms the above findings CTA NECK FINDINGS Aortic arch: Standard branching. Imaged portion shows no evidence of aneurysm or dissection. No significant stenosis of the major arch vessel origins. Right carotid system: Motion artifact obscures the carotid bifurcation. No evidence of dissection, stenosis (50% or greater) or occlusion in the visible carotid system. Left carotid system: Motion artifact obscures the carotid bifurcation. No evidence of dissection, stenosis (50% or greater) or occlusion in the visible carotid system. Vertebral arteries: Codominant. Segments of the vertebral arteries are obscured by motion artifact. No evidence of dissection, stenosis  (50% or greater) or occlusion of the visible vertebral artery systems. Skeleton: Negative. Other neck: Negative. Upper chest: Increased ground-glass opacities within the right upper lung. Review of the MIP images confirms the above findings CTA HEAD FINDINGS Anterior circulation: No significant stenosis, proximal occlusion, aneurysm, or vascular malformation. Posterior circulation: No significant stenosis, proximal occlusion, aneurysm, or vascular malformation. Venous sinuses: As permitted by contrast timing, patent. Anatomic variants: None significant. Delayed phase: No abnormal intracranial enhancement. Review of the MIP images confirms the above findings IMPRESSION: CT head: 1. 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 Stable petechial hemorrhage. No new stroke or hemorrhage identified. 2. Increased edema and mass effect with 10 mm right-to-left midline shift and effacement of right lateral ventricle, previously 6 mm right-to-left midline shift. No downward herniation at this time. CTA neck: 1. Patent carotid and vertebral arteries. No dissection, aneurysm, or hemodynamically significant stenosis utilizing NASCET criteria. 2. Increased ground-glass opacification in the upper right lung, suspected neurogenic edema or possibly pneumonitis. CTA head: 1. Patent anterior and posterior intracranial circulation. No new large vessel occlusion, aneurysm, or significant stenosis. Electronically Signed   By: Mitzi Hansen M.D.   On: 10/02/2018 23:41   Ct Angio Neck W Or Wo Contrast  Result Date: 10/02/2018 CLINICAL DATA:  49 y/o M; follow-up of stroke post intra-arterial revascularization. EXAM: CTA HEAD WITHOUT CONTRAST CT ANGIOGRAPHY HEAD AND NECK TECHNIQUE: Multidetector CT imaging of the head and neck was performed using the standard  protocol during bolus administration of intravenous contrast. Multiplanar CT image reconstructions and MIPs were obtained to evaluate the vascular anatomy. Carotid stenosis measurements (when applicable) are obtained utilizing NASCET criteria, using the distal internal carotid diameter as the denominator. CONTRAST:  OMNIPAQUE IOHEXOL 350 MG/ML SOLN COMPARISON:  09/30/2018 MRI head. 10/02/2018 CTA head and neck. 10/05/2018 cerebral angiogram. 09/28/2018 MRA head. FINDINGS: CT HEAD FINDINGS Brain: Large right MCA distribution infarction involving the right basal ganglia, right insula, right lateral frontal lobe, and right temporal lobe with additional small foci of infarction throughout the right superior frontal and parietal cortices. The distribution of infarction is stable from prior MRI of the head given differences in technique. Edema and mass effect results in effacement of the right lateral ventricle and 10 mm of right-to-left midline shift. There are faint densities throughout the region of infarction corresponding to petechial hemorrhage on the prior MRI of the head without interval change given differences in technique. No new acute stroke, mass effect, or downward herniation at this time. Additional very small infarctions scattered throughout the cerebral hemispheres seen on the prior MRI are  not appreciable on CT. Vascular: As below. Skull: Normal. Negative for fracture or focal lesion. Sinuses: Mild mucosal thickening of ethmoid, sphenoid, and maxillary sinuses. Normal aeration of the mastoid air cells. Orbits are unremarkable. Orbits: No acute finding. Review of the MIP images confirms the above findings CTA NECK FINDINGS Aortic arch: Standard branching. Imaged portion shows no evidence of aneurysm or dissection. No significant stenosis of the major arch vessel origins. Right carotid system: Motion artifact obscures the carotid bifurcation. No evidence of dissection, stenosis (50% or greater) or  occlusion in the visible carotid system. Left carotid system: Motion artifact obscures the carotid bifurcation. No evidence of dissection, stenosis (50% or greater) or occlusion in the visible carotid system. Vertebral arteries: Codominant. Segments of the vertebral arteries are obscured by motion artifact. No evidence of dissection, stenosis (50% or greater) or occlusion of the visible vertebral artery systems. Skeleton: Negative. Other neck: Negative. Upper chest: Increased ground-glass opacities within the right upper lung. Review of the MIP images confirms the above findings CTA HEAD FINDINGS Anterior circulation: No significant stenosis, proximal occlusion, aneurysm, or vascular malformation. Posterior circulation: No significant stenosis, proximal occlusion, aneurysm, or vascular malformation. Venous sinuses: As permitted by contrast timing, patent. Anatomic variants: None significant. Delayed phase: No abnormal intracranial enhancement. Review of the MIP images confirms the above findings IMPRESSION: CT head: 1. 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 Stable petechial hemorrhage. No new stroke or hemorrhage identified. 2. Increased edema and mass effect with 10 mm right-to-left midline shift and effacement of right lateral ventricle, previously 6 mm right-to-left midline shift. No downward herniation at this time. CTA neck: 1. Patent carotid and vertebral arteries. No dissection, aneurysm, or hemodynamically significant stenosis utilizing NASCET criteria. 2. Increased ground-glass opacification in the upper right lung, suspected neurogenic edema or possibly pneumonitis. CTA head: 1. Patent anterior and posterior intracranial circulation. No new large vessel occlusion, aneurysm, or significant stenosis. Electronically Signed   By: Mitzi Hansen M.D.   On: 10/02/2018 23:41   Dg Chest Port 1 View  Result Date: 10/03/2018 CLINICAL DATA:  Check tube placement EXAM: PORTABLE CHEST 1 VIEW COMPARISON:  10/02/2018 FINDINGS: Feeding catheter is again noted extending into the stomach. Endotracheal tube is noted in satisfactory position. Cardiac shadow is stable. Lungs are well aerated. Mild asymmetric edema is noted on the right. IMPRESSION: Endotracheal tube in satisfactory position. Increasing asymmetric edema within the right lung. Electronically Signed   By: Alcide Clever M.D.   On: 10/03/2018 00:46   Dg Chest Port 1 View  Result Date: 10/02/2018 CLINICAL DATA:  Increase shortness of breath. Altered mental status. EXAM: PORTABLE CHEST 1 VIEW COMPARISON:  Earlier same day FINDINGS: 2131 hours. Low lung volumes. The cardio pericardial silhouette is enlarged. There is pulmonary vascular congestion without overt pulmonary edema. No focal airspace consolidation or substantial pleural effusion. A feeding tube passes into the stomach although the distal tip position is not included on the film. Telemetry leads overlie the chest. IMPRESSION: Low volumes with cardiomegaly and vascular congestion. Electronically Signed   By: Kennith Center M.D.   On: 10/02/2018 21:38   Dg Chest Port 1 View  Result Date: 10/02/2018 CLINICAL DATA:  Respiratory failure. EXAM: PORTABLE CHEST 1 VIEW COMPARISON:  Chest x-ray dated September 30, 2018. FINDINGS: Interval removal of the endotracheal tube. Unchanged feeding tube entering the stomach  with the tip below the field of view. Stable cardiomediastinal silhouette. Normal pulmonary vascularity. Improving opacity at the right lung base. No pleural effusion or pneumothorax. No acute osseous abnormality. IMPRESSION: 1. Improving airspace disease at the right lung base. Resolved right pleural effusion. Electronically Signed   By: Obie Dredge M.D.   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 Currently he is on full ventilator support and he had a cardiac arrest 4/14. Does not seem ready for trach/PEG at this time. Will continue to follow and plan trach/PEG when medically ready.  ID - unasyn 4/12>> VTE - SCDs, sq heparin FEN - IVF, TF Foley - in place Follow up - TBD   Franne Forts, [facility] Surgery 10/03/2018, 12:04 PM Pager: [phone] Mon-Thurs 7:00 am-4:30 pm Fri 7:00 am -11:30 AM Sat-Sun 7:00 am-11:30 am

## 2018-10-03 NOTE — Progress Notes (Addendum)
Verbal order from neurology to discontinue 3% infusion.  Dr. Pearlean Brownie spoke to wife about trach and PEG placement as pt is not able to protect airway

## 2018-10-03 NOTE — Progress Notes (Signed)
Pharmacy Antibiotic Note  Johnathan Archer is a 48 y.o. male admitted on 10-17-18 with stroke and underwent emergent mechanical thrombectomy. PTA Xarelto on hold. Pharmacy has been consulted for Unasyn dosing for aspiration pneumonia coverage - day #4. SCr 1.3>>2.22, CrCl still ~50.  Plan: Continue Unasyn 3 gm IV q6h for now - watch SCr trend closely Follow renal function, culture data, clinical progress.  Height: 6' (182.9 cm) Weight: 224 lb 13.9 oz (102 kg) IBW/kg (Calculated) : 77.6  Temp (24hrs), Avg:99.2 F (37.3 C), Min:97.1 F (36.2 C), Max:101.9 F (38.8 C)  Recent Labs  Lab 09/28/18 0224 09/29/18 0320 09/30/18 0428 10/01/18 0712 10/02/18 0557 10/03/18 0630  WBC 9.7 7.3 8.2 7.6 6.7  --   CREATININE 1.51* 1.53* 1.57* 1.67* 1.75* 2.22*    Estimated Creatinine Clearance: 50.3 mL/min (A) (by C-G formula based on SCr of 2.22 mg/dL (H)).    No Known Allergies  Antimicrobials this admission: Cefazolin 2gm IV x 1 pre-op on 4/9 Unasyn 4/12 >>  Dose adjustments this admission:  Microbiology results: 4/15 covid - neg 4/15 resp panel - neg 4/15 TA - few GPC 4/12 blood x 2 - ngtd 4/12 urine - neg 4/12 tracheal aspirate - normal flora 4/9 MRSA PCR - negative  Babs Bertin, PharmD, BCPS Please check AMION for all [facility] contact numbers Clinical Pharmacist 10/03/2018 11:07 AM

## 2018-10-03 NOTE — Progress Notes (Signed)
SLP Cancellation Note  Patient Details Name: Johnathan Archer MRN: [id number] DOB: 17-Jun-1971   Cancelled treatment:        Pt now intubated. Will follow along.   Royce Macadamia 10/03/2018, 7:50 AM   Breck Coons Lonell Face.Ed Nurse, children's [phone] Office [phone]

## 2018-10-03 NOTE — Progress Notes (Signed)
bp's soft this afternoon with evidence of airtrapping on graphics at backup RR =24 instead of set 18    Intake/Output Summary (Last 24 hours) at 10/03/2018 1701 Last data filed at 10/03/2018 1600 Gross per 24 hour  Intake 1503.82 ml  Output 1520 ml  Net -16.18 ml     rec  Vol challenge with ns and increase IV to 125 cc /h  1/2 NS to address both the hypernatremia and apparent hypovolemia  Decrease vent rate to 18 and recheck abgs one hour  Decrease coreg to 12.5 mg daily      Sandrea Hughs, MD Pulmonary and Critical Care Medicine [facility] Cell [phone] After 5:30 PM or weekends, use Beeper [phone]

## 2018-10-03 NOTE — Progress Notes (Signed)
STROKE TEAM PROGRESS NOTE   INTERVAL HISTORY Patient had neurological worsening yesterday evening with depressed level of consciousness and respiratory distress and in fact had cardiac arrest and CT scan requiring resuscitation.  He was intubated.  He has been found to be febrile with tachycardia and suspected aspiration pneumonia.  He has been able to follow commands.  CT scan done yesterday showed no significant change in his recent right MCA infarct but there was slight worsening of right to left midline shift.  Hypertonic saline was restarted but serum sodium this morning is 159.  Vitals:   10/03/18 0745 10/03/18 0800 10/03/18 0815 10/03/18 0820  BP: (!) 148/101 (!) 135/94 (!) 150/95 (!) 150/95  Pulse: 86 93 95 98  Resp: (!) 24 (!) 24 (!) 24 (!) 24  Temp:      TempSrc:      SpO2: 100% 100% 100% 100%  Weight:      Height:        CBC:  Recent Labs  Lab 09/20/2018 1248  09/28/18 0224  10/01/18 0712 10/02/18 0557 10/03/18 0028  WBC 7.3  --  9.7   < > 7.6 6.7  --   NEUTROABS 5.8  --  8.1*  --   --   --   --   HGB 12.5*   < > 11.9*   < > 10.6* 10.2* 10.2*  HCT 38.7*   < > 37.0*   < > 36.0* 34.6* 30.0*  MCV 97.7  --  97.1   < > 106.5* 105.5*  --   PLT 241  --  187   < > 139* 151  --    < > = values in this interval not displayed.    Basic Metabolic Panel:  Recent Labs  Lab 09/28/18 0224  10/02/18 0557 10/03/18 0028 10/03/18 0630  NA 139   < > 160* 158* 159*  K 3.9   < > 3.8 4.0 4.2  CL 107   < > 124*  --  121*  CO2 20*   < > 27  --  25  GLUCOSE 186*   < > 204*  --  251*  BUN 25*   < > 32*  --  36*  CREATININE 1.51*   < > 1.75*  --  2.22*  CALCIUM 8.4*   < > 8.6*  --  8.4*  MG 1.8  --  2.4  --   --   PHOS 3.3  --  3.2  --  5.1*   < > = values in this interval not displayed.    IMAGING past 24h Ct Angio Head W Or Wo Contrast  Result Date: 10/02/2018 CLINICAL DATA:  48 y/o M; follow-up of stroke post intra-arterial revascularization. EXAM: CTA HEAD WITHOUT CONTRAST CT  ANGIOGRAPHY HEAD AND NECK TECHNIQUE: Multidetector CT imaging of the head and neck was performed using the standard protocol during bolus administration of intravenous contrast. Multiplanar CT image reconstructions and MIPs were obtained to evaluate the vascular anatomy. Carotid stenosis measurements (when applicable) are obtained utilizing NASCET criteria, using the distal internal carotid diameter as the denominator. CONTRAST:  OMNIPAQUE IOHEXOL 350 MG/ML SOLN COMPARISON:  09/30/2018 MRI head. 10/01/2018 CTA head and neck. 09/23/2018 cerebral angiogram. 09/28/2018 MRA head. FINDINGS: CT HEAD FINDINGS Brain: Large right MCA distribution infarction involving the right basal ganglia, right insula, right lateral frontal lobe, and right temporal lobe with additional small foci of infarction throughout the right superior frontal and parietal cortices. The distribution of  infarction is stable from prior MRI of the head given differences in technique. Edema and mass effect results in effacement of the right lateral ventricle and 10 mm of right-to-left midline shift. There are faint densities throughout the region of infarction corresponding to petechial hemorrhage on the prior MRI of the head without interval change given differences in technique. No new acute stroke, mass effect, or downward herniation at this time. Additional very small infarctions scattered throughout the cerebral hemispheres seen on the prior MRI are not appreciable on CT. Vascular: As below. Skull: Normal. Negative for fracture or focal lesion. Sinuses: Mild mucosal thickening of ethmoid, sphenoid, and maxillary sinuses. Normal aeration of the mastoid air cells. Orbits are unremarkable. Orbits: No acute finding. Review of the MIP images confirms the above findings CTA NECK FINDINGS Aortic arch: Standard branching. Imaged portion shows no evidence of aneurysm or dissection. No significant stenosis of the major arch vessel origins. Right carotid  system: Motion artifact obscures the carotid bifurcation. No evidence of dissection, stenosis (50% or greater) or occlusion in the visible carotid system. Left carotid system: Motion artifact obscures the carotid bifurcation. No evidence of dissection, stenosis (50% or greater) or occlusion in the visible carotid system. Vertebral arteries: Codominant. Segments of the vertebral arteries are obscured by motion artifact. No evidence of dissection, stenosis (50% or greater) or occlusion of the visible vertebral artery systems. Skeleton: Negative. Other neck: Negative. Upper chest: Increased ground-glass opacities within the right upper lung. Review of the MIP images confirms the above findings CTA HEAD FINDINGS Anterior circulation: No significant stenosis, proximal occlusion, aneurysm, or vascular malformation. Posterior circulation: No significant stenosis, proximal occlusion, aneurysm, or vascular malformation. Venous sinuses: As permitted by contrast timing, patent. Anatomic variants: None significant. Delayed phase: No abnormal intracranial enhancement. Review of the MIP images confirms the above findings IMPRESSION: CT head: 1. 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 Stable petechial hemorrhage. No new stroke or hemorrhage identified. 2. Increased edema and mass effect with 10 mm right-to-left midline shift and effacement of right lateral ventricle, previously 6 mm right-to-left midline shift. No downward herniation at this time. CTA neck: 1. Patent carotid and vertebral arteries. No dissection, aneurysm, or hemodynamically significant stenosis utilizing NASCET criteria. 2. Increased ground-glass opacification in the upper right lung, suspected neurogenic edema or possibly pneumonitis. CTA head: 1. Patent anterior and posterior intracranial  circulation. No new large vessel occlusion, aneurysm, or significant stenosis. Electronically Signed   By: Mitzi Hansen M.D.   On: 10/02/2018 23:41   Ct Head Wo Contrast  Result Date: 10/02/2018 CLINICAL DATA:  48 y/o M; follow-up of stroke post intra-arterial revascularization. EXAM: CTA HEAD WITHOUT CONTRAST CT ANGIOGRAPHY HEAD AND NECK TECHNIQUE: Multidetector CT imaging of the head and neck was performed using the standard protocol during bolus administration of intravenous contrast. Multiplanar CT image reconstructions and MIPs were obtained to evaluate the vascular anatomy. Carotid stenosis measurements (when applicable) are obtained utilizing NASCET criteria, using the distal internal carotid diameter as the denominator. CONTRAST:  OMNIPAQUE IOHEXOL 350 MG/ML SOLN COMPARISON:  09/30/2018 MRI head. 10/08/18 CTA head and neck. Oct 08, 2018 cerebral angiogram. 09/28/2018 MRA head. FINDINGS: CT HEAD FINDINGS Brain: Large right MCA distribution infarction involving the right basal ganglia, right insula, right lateral frontal lobe, and right temporal lobe with additional small foci of infarction throughout the right superior frontal and  parietal cortices. The distribution of infarction is stable from prior MRI of the head given differences in technique. Edema and mass effect results in effacement of the right lateral ventricle and 10 mm of right-to-left midline shift. There are faint densities throughout the region of infarction corresponding to petechial hemorrhage on the prior MRI of the head without interval change given differences in technique. No new acute stroke, mass effect, or downward herniation at this time. Additional very small infarctions scattered throughout the cerebral hemispheres seen on the prior MRI are not appreciable on CT. Vascular: As below. Skull: Normal. Negative for fracture or focal lesion. Sinuses: Mild mucosal thickening of ethmoid, sphenoid, and maxillary  sinuses. Normal aeration of the mastoid air cells. Orbits are unremarkable. Orbits: No acute finding. Review of the MIP images confirms the above findings CTA NECK FINDINGS Aortic arch: Standard branching. Imaged portion shows no evidence of aneurysm or dissection. No significant stenosis of the major arch vessel origins. Right carotid system: Motion artifact obscures the carotid bifurcation. No evidence of dissection, stenosis (50% or greater) or occlusion in the visible carotid system. Left carotid system: Motion artifact obscures the carotid bifurcation. No evidence of dissection, stenosis (50% or greater) or occlusion in the visible carotid system. Vertebral arteries: Codominant. Segments of the vertebral arteries are obscured by motion artifact. No evidence of dissection, stenosis (50% or greater) or occlusion of the visible vertebral artery systems. Skeleton: Negative. Other neck: Negative. Upper chest: Increased ground-glass opacities within the right upper lung. Review of the MIP images confirms the above findings CTA HEAD FINDINGS Anterior circulation: No significant stenosis, proximal occlusion, aneurysm, or vascular malformation. Posterior circulation: No significant stenosis, proximal occlusion, aneurysm, or vascular malformation. Venous sinuses: As permitted by contrast timing, patent. Anatomic variants: None significant. Delayed phase: No abnormal intracranial enhancement. Review of the MIP images confirms the above findings IMPRESSION: CT head: 1. 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 Stable petechial hemorrhage. No new stroke or hemorrhage identified. 2. Increased edema and mass effect with 10 mm right-to-left midline shift and effacement of right lateral ventricle, previously 6 mm right-to-left midline shift. No downward  herniation at this time. CTA neck: 1. Patent carotid and vertebral arteries. No dissection, aneurysm, or hemodynamically significant stenosis utilizing NASCET criteria. 2. Increased ground-glass opacification in the upper right lung, suspected neurogenic edema or possibly pneumonitis. CTA head: 1. Patent anterior and posterior intracranial circulation. No new large vessel occlusion, aneurysm, or significant stenosis. Electronically Signed   By: Mitzi Hansen M.D.   On: 10/02/2018 23:41   Ct Angio Neck W Or Wo Contrast  Result Date: 10/02/2018 CLINICAL DATA:  48 y/o M; follow-up of stroke post intra-arterial revascularization. EXAM: CTA HEAD WITHOUT CONTRAST CT ANGIOGRAPHY HEAD AND NECK TECHNIQUE: Multidetector CT imaging of the head and neck was performed using the standard protocol during bolus administration of intravenous contrast. Multiplanar CT image reconstructions and MIPs were obtained to evaluate the vascular anatomy. Carotid stenosis measurements (when applicable) are obtained utilizing NASCET criteria, using the distal internal carotid diameter as the denominator. CONTRAST:  OMNIPAQUE IOHEXOL 350 MG/ML SOLN COMPARISON:  09/30/2018 MRI head. 09/20/2018 CTA head and neck. 10/07/2018 cerebral angiogram. 09/28/2018 MRA head. FINDINGS: CT HEAD FINDINGS Brain: Large right MCA distribution infarction involving the right basal ganglia, right insula, right lateral frontal lobe, and right temporal lobe with additional small foci  of infarction throughout the right superior frontal and parietal cortices. The distribution of infarction is stable from prior MRI of the head given differences in technique. Edema and mass effect results in effacement of the right lateral ventricle and 10 mm of right-to-left midline shift. There are faint densities throughout the region of infarction corresponding to petechial hemorrhage on the prior MRI of the head without interval change given differences in  technique. No new acute stroke, mass effect, or downward herniation at this time. Additional very small infarctions scattered throughout the cerebral hemispheres seen on the prior MRI are not appreciable on CT. Vascular: As below. Skull: Normal. Negative for fracture or focal lesion. Sinuses: Mild mucosal thickening of ethmoid, sphenoid, and maxillary sinuses. Normal aeration of the mastoid air cells. Orbits are unremarkable. Orbits: No acute finding. Review of the MIP images confirms the above findings CTA NECK FINDINGS Aortic arch: Standard branching. Imaged portion shows no evidence of aneurysm or dissection. No significant stenosis of the major arch vessel origins. Right carotid system: Motion artifact obscures the carotid bifurcation. No evidence of dissection, stenosis (50% or greater) or occlusion in the visible carotid system. Left carotid system: Motion artifact obscures the carotid bifurcation. No evidence of dissection, stenosis (50% or greater) or occlusion in the visible carotid system. Vertebral arteries: Codominant. Segments of the vertebral arteries are obscured by motion artifact. No evidence of dissection, stenosis (50% or greater) or occlusion of the visible vertebral artery systems. Skeleton: Negative. Other neck: Negative. Upper chest: Increased ground-glass opacities within the right upper lung. Review of the MIP images confirms the above findings CTA HEAD FINDINGS Anterior circulation: No significant stenosis, proximal occlusion, aneurysm, or vascular malformation. Posterior circulation: No significant stenosis, proximal occlusion, aneurysm, or vascular malformation. Venous sinuses: As permitted by contrast timing, patent. Anatomic variants: None significant. Delayed phase: No abnormal intracranial enhancement. Review of the MIP images confirms the above findings IMPRESSION: CT head: 1. Stroke in the right MCA distribution predominantly within the right basal ganglia and perisylvian frontal  and temporal lobes is stable in distribution in comparison with the prior MRI given differences in technique. Stable petechial hemorrhage. No new stroke or hemorrhage identified. 2. Increased edema and mass effect with 10 mm right-to-left midline shift and effacement of right lateral ventricle, previously 6 mm right-to-left midline shift. No downward herniation at this time. CTA neck: 1. Patent carotid and vertebral arteries. No dissection, aneurysm, or hemodynamically significant stenosis utilizing NASCET criteria. 2. Increased ground-glass opacification in the upper right lung, suspected neurogenic edema or possibly pneumonitis. CTA head: 1. Patent anterior and posterior intracranial circulation. No new large vessel occlusion, aneurysm, or significant stenosis. Electronically Signed   By: Mitzi HansenLance  Furusawa-Stratton M.D.   On: 10/02/2018 23:41   Dg Chest Port 1 View  Result Date: 10/03/2018 CLINICAL DATA:  Check tube placement EXAM: PORTABLE CHEST 1 VIEW COMPARISON:  10/02/2018 FINDINGS: Feeding catheter is again noted extending into the stomach. Endotracheal tube is noted in satisfactory position. Cardiac shadow is stable. Lungs are well aerated. Mild asymmetric edema is noted on the right. IMPRESSION: Endotracheal tube in satisfactory position. Increasing asymmetric edema within the right lung. Electronically Signed   By: Alcide CleverMark  Lukens M.D.   On: 10/03/2018 00:46   Dg Chest Port 1 View  Result Date: 10/02/2018 CLINICAL DATA:  Increase shortness of breath. Altered mental status. EXAM: PORTABLE CHEST 1 VIEW COMPARISON:  Earlier same day FINDINGS: 2131 hours. Low lung volumes. The cardio pericardial silhouette is enlarged. There is pulmonary vascular congestion  without overt pulmonary edema. No focal airspace consolidation or substantial pleural effusion. A feeding tube passes into the stomach although the distal tip position is not included on the film. Telemetry leads overlie the chest. IMPRESSION: Low  volumes with cardiomegaly and vascular congestion. Electronically Signed   By: Kennith Center M.D.   On: 10/02/2018 21:38     PHYSICAL EXAM : General -obese middle-aged African-American male who is intubated but not on sedation. Ophthalmologic - fundi not visualized due to noncooperation.  Cardiovascular - Regular rate and rhythm, not in afib.  Neuro -   intubated.  Not sedated.  Drowsy but can be easily aroused. able to following simple commands on the right. , eyes deviated to the right with left gaze palsy., pupil bilateral 2.5 mm, sluggish reaction to light. No blinking to visual threat bilaterally, right gaze with sustained nystagmus, left gaze palsy. Corneal reflex weak on the left but present on the right, gag and cough present.  Bilateral facial weakness. Tongue midline in mouth. Spontaneous moving RUE and RLE well against gravity, RUE 3-/5, RLE 2/5. LUE 0/5 and LLE slight withdraw on pain stimulation. DTR 1+ and left babinski. Sensation and coordination not cooperative and gait not tested.   ASSESSMENT/PLAN [name] is a 48 y.o. male with history of HTN, DB, CHF and AF on Xarelto found down at work, presenting with L hemiparesis and R gaze.   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 Early infarct R BG and insula.    CTA head & neck ELO R ICA, R M1, R M2. R A1 clot.   Cerebral angio R M1 TICI2b reperfusion  CT head hyperdensity R BG, contrast vs hmg. No mass effect  MRI  Large R MCA infarct, associated edema and mass effect w/o shift. prob small R cerebellar infarct. Small vessel disease.   MRA  Normal, previous R M1 open  2D Echo EF 60-65%. No source of embolus   CT  head - R MCA R BG, perisylvian frontal and temporal lobes stable. Stable petechial hemorrhage. No new stroke. Increased edema and mass effect 10 mm R to L shift and effacement R lat ventricle (was 6 mm)  CTA head - no LVO, patent circ  CTA neck - no LVO or sign stenosis. Increased ground-glass opacification upper R lung  LDL 73  HgbA1c 8.9  Heparin 5000 units sq tid or VTE prophylaxis Xarelto (rivaroxaban) daily prior to admission, now on No antithrombotic.  On ASA 325, consider resume DOAC once able to swallow or permanent tube placed  Therapy recommendations: CIR  Disposition:  pending   Cerebral edema - increasing midline shift Induced hypernatremia  09/28/2018 pt with right ptosis, right pupil dilation with reserved reflex, right gaze sustained nystagmus -> 09/29/2018 bilateral proptosis, minimal extraocular movement bilaterally, pupil equal sluggish to light, bilateral facial weakness, dysphagia  MRI right temporal pole edema with cerebral peduncle compression  CT repeat 09/29/2018- Increasing regional mass effect and edema with associated 4 mm right-to-left shift.   MRI repeat increased MLS to 72mm, brainstem compression stable  Treated with 3% protocol, drip off as at goal  Na 159  On free water 300 q 3h and D5 gtt -  stop free water given increased edema  Cardiac Arrest  While in CT 4/14 for decreased LOC and respiratory compromise  epinephine & compressions  Acute on Chronic Respiratory Failure w/ hypoxia and hypercapnia  OSA  Initial intubation in ED for IR  Extubated 4/10  reintubated 4/11 d/t increased WOB  Extubated 4/14  reintubated 4/15 d/t diminished cough and gag, snoring  Will need trach   CCM onboard  Atrial Fibrillation w/ RVR & VT  Home anticoagulation:  Xarelto (rivaroxaban) daily  . Home meds: amiodarone 200 . On ASA 325  Consider resume DOAC once able to swallow or permanent tube placed   Chronic combined systolic and diastolic CHF    Home meds: Coreg 37.5 bid, lasix 60 bid, hydralazine 50 bid, isosorbide 30, entresto bid, spironolactone 25  CXR 09/22/2018 lungs clear  resumed home meds - Coreg 25 bid, lasix 60 bid, hydralazine 50 bid, isosorbide 30, entresto bid, spironolactone 25  Fever   Tmax 99.9  Blood culture no growth    urine culture no growth   resp culture normal respiratory flora  Continue hydration  CXR  Improving airspace R lung base. Resolved right pleural effusion  Unasyn 4/12>>  CCM on board  Dysphagia  Secondary to stroke  On tube feeds via Cortrak  SLP following   Trauma called for PEG placement  Urinary Retention  Foley inserted 4/14 after multiple I&O  Hypertension  Home meds:  Coreg 37.5 bid, lasix 60 bid, hydralazine 50 bid, isosorbide 30, entresto bid, spironolactone 25  BP goal < 180  Off cleviprex  On Coreg 25 bid, lasix 60 bid, hydralazine 50 bid, isosorbide 30, entresto bid, spironolactone 25 . Long-term BP goal normotensive  Hyperlipidemia  Home meds:  No statin  LDL 73, goal < 70  Add lipitor 20  Continue statin at discharge  Diabetes type II uncontrolled  Home meds:  Metformin 500 bid, insulin degludec 8u  HgbA1c 8.9, goal < 7.0  Hyperglycemia  CCM on board  Lantus to 18u q hs  novolog 0-15 q4h  Novolog 6u QID for TF coverage  Hold off metformin due to elevated Cre  CBGs  SSI  Other Stroke Risk Factors  Obesity, Body mass index is 30.5 kg/m., recommend weight loss, diet and exercise as appropriate   Obstructive sleep apnea  Nonischemic CM  Other Active Problems  AKI on CKD stage II Cr 1.67->1.75->2.22  Normocytic Anemia 12.5->10.2, MCV 105.5 - check VB 12 and folate  Thrombocytopenia, resolved 241->151  Hospital day # 6  Patient unfortunately had neurological worsening yesterday likely due to inability to protect airway secondary to aspiration pneumonia versus increase cytotoxic edema and requires reintubation.  He  will likely need early tracheostomy and PEG tube.  Antibiotics for aspiration pneumonia as per critical care team.  Keep serum sodium 150-155 range.  Hold hypertonic saline and reduced free water to 300 cc every 8 hourly.  Long discussion with patient's wife over the phone and answered questions.  Discussed with Dr. Shona Simpson from critical care medicine.  Consult trauma team for trach and PEG. This patient is critically ill and at significant risk of neurological worsening, death and care requires constant monitoring of vital signs, hemodynamics,respiratory and cardiac monitoring, extensive review of multiple databases, frequent neurological assessment, discussion with family, other specialists and medical decision making of high complexity.I have made any additions or clarifications directly to the above note.This critical care time does not reflect procedure time, or teaching time or supervisory time of PA/NP/Med Resident  etc but could involve care discussion time.  I spent 40 minutes of neurocritical care time  in the care of  this patient.      Delia Heady, MD Medical Director [facility] Pager: [phone] 10/03/2018 8:45 AM   To contact Stroke Continuity provider, please refer to [email or web address]. After hours, contact General Neurology

## 2018-10-04 DIAGNOSIS — J9621 Acute and chronic respiratory failure with hypoxia: Secondary | ICD-10-CM

## 2018-10-04 DIAGNOSIS — I63511 Cerebral infarction due to unspecified occlusion or stenosis of right middle cerebral artery: Secondary | ICD-10-CM

## 2018-10-04 DIAGNOSIS — J9622 Acute and chronic respiratory failure with hypercapnia: Secondary | ICD-10-CM

## 2018-10-04 DIAGNOSIS — J690 Pneumonitis due to inhalation of food and vomit: Secondary | ICD-10-CM

## 2018-10-04 LAB — CBC
HCT: 30.5 % — ABNORMAL LOW (ref 39.0–52.0)
Hemoglobin: 8.7 g/dL — ABNORMAL LOW (ref 13.0–17.0)
MCH: 30.2 pg (ref 26.0–34.0)
MCHC: 28.5 g/dL — ABNORMAL LOW (ref 30.0–36.0)
MCV: 105.9 fL — ABNORMAL HIGH (ref 80.0–100.0)
Platelets: 129 10*3/uL — ABNORMAL LOW (ref 150–400)
RBC: 2.88 MIL/uL — ABNORMAL LOW (ref 4.22–5.81)
RDW: 13.7 % (ref 11.5–15.5)
WBC: 6.9 10*3/uL (ref 4.0–10.5)
nRBC: 0 % (ref 0.0–0.2)

## 2018-10-04 LAB — BASIC METABOLIC PANEL
Anion gap: 6 (ref 5–15)
BUN: 36 mg/dL — ABNORMAL HIGH (ref 6–20)
CO2: 28 mmol/L (ref 22–32)
Calcium: 7.9 mg/dL — ABNORMAL LOW (ref 8.9–10.3)
Chloride: 123 mmol/L — ABNORMAL HIGH (ref 98–111)
Creatinine, Ser: 1.86 mg/dL — ABNORMAL HIGH (ref 0.61–1.24)
GFR calc Af Amer: 48 mL/min — ABNORMAL LOW (ref >60)
GFR calc non Af Amer: 42 mL/min — ABNORMAL LOW (ref >60)
Glucose, Bld: 230 mg/dL — ABNORMAL HIGH (ref 70–99)
Potassium: 3.8 mmol/L (ref 3.5–5.1)
Sodium: 157 mmol/L — ABNORMAL HIGH (ref 135–145)

## 2018-10-04 LAB — GLUCOSE, CAPILLARY
Glucose-Capillary: 146 mg/dL — ABNORMAL HIGH (ref 70–99)
Glucose-Capillary: 213 mg/dL — ABNORMAL HIGH (ref 70–99)
Glucose-Capillary: 177 mg/dL — ABNORMAL HIGH (ref 70–99)
Glucose-Capillary: 161 mg/dL — ABNORMAL HIGH (ref 70–99)
Glucose-Capillary: 193 mg/dL — ABNORMAL HIGH (ref 70–99)
Glucose-Capillary: 194 mg/dL — ABNORMAL HIGH (ref 70–99)

## 2018-10-04 LAB — FOLATE RBC
Folate, Hemolysate: 349 ng/mL
Folate, RBC: 1108 ng/mL (ref >498)
Hematocrit: 31.5 % — ABNORMAL LOW (ref 37.5–51.0)

## 2018-10-04 LAB — PROTIME-INR
INR: 1.2 (ref 0.8–1.2)
Prothrombin Time: 15 seconds (ref 11.4–15.2)

## 2018-10-04 LAB — SODIUM
Sodium: 159 mmol/L — ABNORMAL HIGH (ref 135–145)
Sodium: 158 mmol/L — ABNORMAL HIGH (ref 135–145)
Sodium: 156 mmol/L — ABNORMAL HIGH (ref 135–145)

## 2018-10-04 MED ORDER — HYDRALAZINE HCL 50 MG PO TABS
100.0000 mg | ORAL_TABLET | Freq: Two times a day (BID) | ORAL | Status: DC
  Administered 2018-10-04 – 2018-10-10 (×13): 100 mg
  Filled 2018-10-04 (×13): qty 2

## 2018-10-04 MED ORDER — DEXTROSE-NACL 5-0.45 % IV SOLN
INTRAVENOUS | Status: AC
  Administered 2018-10-04: 12:00:00 via INTRAVENOUS

## 2018-10-04 MED ORDER — INSULIN ASPART 100 UNIT/ML ~~LOC~~ SOLN
6.0000 [IU] | SUBCUTANEOUS | Status: DC
  Administered 2018-10-04 – 2018-10-05 (×5): 6 [IU] via SUBCUTANEOUS

## 2018-10-04 MED ORDER — DEXTROSE 5 % IV SOLN: INTRAVENOUS | Status: DC

## 2018-10-04 MED ORDER — HYDRALAZINE HCL 20 MG/ML IJ SOLN
10.0000 mg | INTRAMUSCULAR | Status: DC | PRN
  Administered 2018-10-04 – 2018-10-05 (×5): 20 mg via INTRAVENOUS
  Filled 2018-10-04 (×5): qty 1

## 2018-10-04 MED ORDER — FREE WATER
300.0000 mL | Status: DC
  Administered 2018-10-04 – 2018-10-09 (×31): 300 mL

## 2018-10-04 MED ORDER — LABETALOL HCL 5 MG/ML IV SOLN
10.0000 mg | INTRAVENOUS | Status: DC | PRN
  Administered 2018-10-05: 20 mg via INTRAVENOUS
  Filled 2018-10-04: qty 4

## 2018-10-04 MED ORDER — INSULIN GLARGINE 100 UNIT/ML ~~LOC~~ SOLN
25.0000 [IU] | Freq: Every day | SUBCUTANEOUS | Status: DC
  Administered 2018-10-04 – 2018-10-10 (×7): 25 [IU] via SUBCUTANEOUS
  Filled 2018-10-04 (×8): qty 0.25

## 2018-10-04 MED ORDER — VITAMIN B-12 1000 MCG PO TABS
2000.0000 ug | ORAL_TABLET | Freq: Every day | ORAL | Status: DC
  Administered 2018-10-04 – 2018-10-10 (×7): 2000 ug via ORAL
  Filled 2018-10-04 (×8): qty 2

## 2018-10-04 NOTE — Progress Notes (Signed)
R hand IV with swelling and blisters that developed overnight On arrival to my shift at 0700 this morning, R hand IV w/ swelling, blisters, and redness into the distal forearm.  IV taken out by night RN and elevated.  IV was saline locked and working yesterday since the beginning of my shift at 0700.  Neurology and CCM notified this morning.  Trauma will hold off on the trach/PEG placement possibly until early next wk because of phlebitis of the R hand and forearm.  IV antibiotics will be started.  Wound care consult placed for blisters of R hand.  Order to place a single layer of xeroform on and change every other day.   At 1630, RN noticed swelling and blister formation of R hand/forearm had gotten worse and red line above L forearm IV.  L forearm IV and L upper arm IV removed. CCM and trauma at bedside.  Hand surgery and ID paged.   Elevate arm and apply warm compresses per hand surgeon.  Will continue to monitor.

## 2018-10-04 NOTE — Progress Notes (Addendum)
NAME:  Johnathan Archer, MRN:  [id number], DOB:  1971/01/10, LOS: 7 ADMISSION DATE:  09/21/2018, CONSULTATION DATE:  10/17/2018 REFERRING MD:  Dr. Loni Beckwith, CHIEF COMPLAINT:  CVA  Brief History   7 yoM, LSW 1000 am 4/9, presented with left sided paralysis and right gaze found to have acute right MCA M1 occlusion.  Difficult intubation for airway protection.  Taken for EVR with successful revascularization.    History of present illness   HPI obtained from medical chart review as patient is intubated/ sedated   48 year old male with history of Afib on xarelto, chronic systolic / dystolic HF, DMT2, HTN, MR, NICM, anemia, obesity, and OSA, last seen well around 1000 by coworkers found wedged between sink and bathroom stall at work.  Presented as code stroke with left sided paralysis and right gaze.  Hypertensive at 221/ 145.  Found on CT to have acute right MCA M1 occlusion.  Intubated 4/9  for airway protection, was a difficult intubation.  Taken to IR with successful revascularization of right MCA M1 occlusion achieving TICI 2b revascularization.  Returns to ICU, PCCM to consult for ventilator management.   Called back am 4/15 due to arrest in CT with ? Aspiration   Past Medical History  Afib on xarelto, chronic systolic / dystolic HF, DMT2, HTN, MR, NICM, anemia, obesity, OSA, ED   Significant Hospital Events   4/9 Admitted / EVR 4/10 Extubated, started on 3% saline 4/11 Remains on Cleviprex, Failed swallowing evaluation 4/10, left n.p.o. with gastric tube in place, fevers 4/12 Intubated early am for airway compromise/ repeat MRI, rising Na, 3% held, pan-cultured and started on abx  4/13  Re-extubated  4/15 reintubated p ? Asp event in Ct > for peg/trach  Consults:  Neurology   Procedures:  4/9 ETT >>4/10; 4/12 >> 4/13  4/9 R radial aline >> 4/9 cerebral angiogram  Re et  4/14  Significant Diagnostic Tests:  4/9 CTH >> 1. Hyperdense right MCA compatible with acute embolus.  Early infarct in the right basal ganglia and insula 2. 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 Poor flow in right MCA branches. Small amount of clot in right A1 segment. Findings compatible with acute embolus. No significant atherosclerotic disease in the neck or head.  4/9 CTH post intervention >> Hyperdensity within the right basal ganglia may indicate contrast staining or petechial hemorrhage. No significant mass effect.  4/10 MRI brain >> evolving right MCA M1 stroke, culprit vessel is patent.  Some petechial changes without overt hemorrhagic conversion, no shift  Echocardiogram 4/10 >> normal LV function 60-65%, mildly increased LV wall thickness without diastolic dysfunction, normal RV function, no evidence of shunt by bubble study,  4/12 MRI Brain >> continued interval evolution large right MCA CVA, scattered petechial hemorrhage slightly increased, increased gyral swelling with edema and regional mass-effect.  Right to left shift increased from 4 mm to 6 mm.  No hydrocephalus.  A few new subcentimeter acute left cerebral infarcts  Micro Data:  4/9 MRSA PCR >> neg  4/12 BC x 2 >> 4/12 UC >> neg  4/12 trach asp >> mod gpc, few gnr >>> nl flora  4/15 Trach asp >>> mod wbc, few gpc >> 4/15 Resp panel by PCR >  Neg  4/15 Covid-19  Neg   Antimicrobials:  4/9 ancef preop Unaysn 4/12-4/16  Interim history/subjective:  Follows commands. Trauma discussed plan for trach next week.  Objective   Blood pressure 140/80, pulse 68, temperature 98.7 F (37.1 C), temperature source Rectal, resp. rate 18, height 6' (1.829 m), weight 102 kg, SpO2 100 %.  RA    Vent Mode: PRVC FiO2 (%):  [40 %-60 %] 40 % Set Rate:  [18 bmp-24 bmp] 18 bmp Vt Set:  [620 mL] 620 mL PEEP:  [8 cmH20] 8 cmH20 Plateau Pressure:  [12 cmH20-21 cmH20] 19 cmH20   Intake/Output Summary  (Last 24 hours) at 10/04/2018 1045 Last data filed at 10/04/2018 0700 Gross per 24 hour  Intake 3018.16 ml  Output 1675 ml  Net 1343.16 ml   Filed Weights   09/30/18 0630 10/02/18 1954 10/03/18 0436  Weight: 97 kg 95.6 kg 102 kg   Physical Exam: General: Well-appearing, no acute distress HENT: [place], AT, ETT in place Eyes: EOMI, no scleral icterus Respiratory: Clear to auscultation bilaterally.  No crackles, wheezing or rales Cardiovascular: RRR, -M/R/G, no JVD GI: BS+, soft, nontender Extremities:RUE swelling with mild tenderness, radial pulse palpable Neuro: Awake, follows commands on right arm and leg, left hemiparesis Skin: Right hand blistering secondary to edema GU: Foley in place  Resolved Hospital Problem list    Assessment & Plan:   Right MCA M1 occlusion s/p EVR with successful revascularization Plan:  Management per stroke team and interventional radiology, suspect the degree of his neurological injury impacting airway protection> needs trach / peg  Hypertonic saline held since 4/12  Acute respiratory failure, impaired airway protection and aspiration pneumonia Difficult airway OSA Completed unasyn x 5d  Plan: Full vent support Trauma consulted for trach. Plan for next week Follow-up final cultures  Acute renal failure UOP adequate Plan: BMP now Monitor UOP Hold nephrotoxic agents  Hypernatremia Likely iatrogenic, consider hypovolemia Plan: 1/2 NS D5 Free water Trend Na  RUE edema Phlebitis? Worsening edema and skin sloughing Plan: Consult Ortho/Hand Consult ID Keep elevated Warm compresses  Afib Elevated INR Plan:  Xarelto on hold, defer to neurology as to when this can be restarted in light of the petechial changes on his MRI brain 4/10 and 4/12 Amiodarone restarted 4/11 INR daily  Chronic systolic/ diastolic HF 2/20 EF 55%, normal RV, mod MR, mild dilation of aortic root.  Echocardiogram 4/11 with improved LV function and no evidence of  impaired LV relaxation as above P:  Restarted home carvedilol at lower dose 4/11 Hold entresto for AKI  Hypertension, UDS negative P:  SBP goals < 180  = adequately rx'd  Continue coreg 25 mg BID, ASA, isordil 10 mg TID Increase hydralazine 100 mg BID Prn labetalol/hydralazine  DMT2 P:  Sliding-scale insulin  Increase lantus 25U daily Home metformin and Tresiba insulin are on hold  Best practice:  Diet: NPO, TF Pain/Anxiety/Delirium protocol (if indicated): sedate prn fentanyl VAP protocol (if indicated): no longer indcated  DVT prophylaxis: SCDs/heparin sq  GI prophylaxis: PPI Glucose control: SSI resistant / lantus Mobility: BR Code Status: Full Family Communication: none at bedside Disposition: keep in NICU/ needs trach/peg per [facility]   CBC: Recent Labs  Lab 09/21/2018 1248  09/28/18 0224  09/29/18 0320  09/30/18 0428  10/01/18 1610 10/02/18 0557 10/03/18 0028 10/03/18 1153 10/03/18 2014  WBC 7.3  --  9.7  --  7.3  --  8.2  --  7.6 6.7  --  8.6  --   NEUTROABS 5.8  --  8.1*  --   --   --   --   --   --   --   --   --   --  HGB 12.5*   < > 11.9*   < > 11.2*   < > 12.0*   < > 10.6* 10.2* 10.2* 10.6* 9.2*  HCT 38.7*   < > 37.0*   < > 36.8*   < > 41.6   < > 36.0* 34.6* 30.0* 34.1* 27.0*  MCV 97.7  --  97.1  --  101.7*  --  105.3*  --  106.5* 105.5*  --  100.0  --   PLT 241  --  187  --  176  --  149*  --  139* 151  --  PLATELET CLUMPS NOTED ON SMEAR, UNABLE TO ESTIMATE  --    < > = values in this interval not displayed.    Basic Metabolic Panel: Recent Labs  Lab 09/28/18 0224  09/29/18 0320  09/30/18 0428  10/01/18 3435  10/02/18 0557 10/03/18 0028 10/03/18 0630 10/03/18 1153 10/03/18 1638 10/03/18 2014 10/03/18 2312 10/04/18 0516  NA 139   < > 154*   < > 163*   < > 165*   < > 160* 158* 159* 159* 160* 160* 159* 158*  K 3.9   < > 3.9   < > 4.0   < > 3.6  --  3.8 4.0 4.2  --   --  3.6  --   --   CL 107  --  122*  --  129*  --  129*  --  124*  --   121*  --   --   --   --   --   CO2 20*  --  22  --  25  --  28  --  27  --  25  --   --   --   --   --   GLUCOSE 186*  --  186*  --  298*  --  74  --  204*  --  251*  --   --   --   --   --   BUN 25*  --  25*  --  23*  --  30*  --  32*  --  36*  --   --   --   --   --   CREATININE 1.51*  --  1.53*  --  1.57*  --  1.67*  --  1.75*  --  2.22*  --   --   --   --   --   CALCIUM 8.4*  --  8.3*  --  8.7*  --  8.8*  --  8.6*  --  8.4*  --   --   --   --   --   MG 1.8  --   --   --   --   --   --   --  2.4  --   --   --   --   --   --   --   PHOS 3.3  --   --   --   --   --   --   --  3.2  --  5.1*  --   --   --   --   --    < > = values in this interval not displayed.   GFR: Estimated Creatinine Clearance: 50.3 mL/min (A) (by C-G formula based on SCr of 2.22 mg/dL (H)). Recent Labs  Lab 09/30/18 0428 10/01/18 0712 10/02/18 0557 10/03/18 1153  WBC 8.2 7.6 6.7 8.6  Liver Function Tests: Recent Labs  Lab 10/08/2018 1248  AST 19  ALT 22  ALKPHOS 50  BILITOT 1.7*  PROT 7.3  ALBUMIN 3.3*   No results for input(s): LIPASE, AMYLASE in the last 168 hours. No results for input(s): AMMONIA in the last 168 hours.  ABG    Component Value Date/Time   PHART 7.441 10/03/2018 2014   PCO2ART 41.6 10/03/2018 2014   PO2ART 164.0 (H) 10/03/2018 2014   HCO3 28.2 (H) 10/03/2018 2014   TCO2 29 10/03/2018 2014   ACIDBASEDEF 2.0 09/30/2018 0624   O2SAT 100.0 10/03/2018 2014     Coagulation Profile: Recent Labs  Lab 10/08/2018 1248  INR 2.6*    Cardiac Enzymes: No results for input(s): CKTOTAL, CKMB, CKMBINDEX, TROPONINI in the last 168 hours.  HbA1C: Hgb A1c MFr Bld  Date/Time Value Ref Range Status  09/28/2018 02:24 AM 8.9 (H) 4.8 - 5.6 % Final    Comment:    (NOTE) Pre diabetes:          5.7%-6.4% Diabetes:              >6.4% Glycemic control for   <7.0% adults with diabetes   03/11/2017 07:41 PM 8.0 (H) 4.8 - 5.6 % Final    Comment:    (NOTE) Pre diabetes:          5.7%-6.4%  Diabetes:              >6.4% Glycemic control for   <7.0% adults with diabetes     CBG: Recent Labs  Lab 10/03/18 1537 10/03/18 1950 10/03/18 2326 10/04/18 0410 10/04/18 0759  GLUCAP 180* 145* 133* 146* 213*    The patient is critically ill with multiple organ systems failure and requires high complexity decision making for assessment and support, frequent evaluation and titration of therapies, application of advanced monitoring technologies and extensive interpretation of multiple databases.   Critical Care Time devoted to patient care services described in this note is 36 Minutes. This time reflects time of care of this signee Dr. Mechele CollinJane Quintella Mura. This critical care time does not reflect procedure time, or teaching time or supervisory time of PA/NP/Med student/Med Resident etc but could involve care discussion time.  Mechele CollinJane Adalberto Metzgar, M.D. [facility]/Critical [facility] Medicine Pager: [phone] After hours pager: [phone]

## 2018-10-04 NOTE — Progress Notes (Signed)
Patient ID: Johnathan Archer, male   DOB: 01/12/1971, 48 y.o.   MRN: [id number] Still on PEEP 8 Phlebitis R hand and forearm Will plan trach/PEG early next week.  Violeta Gelinas, MD, MPH, FACS Trauma: [phone] General Surgery: [phone]

## 2018-10-04 NOTE — Consult Note (Signed)
WOC Nurse wound consult note Consultation completed by use of remote telehealth camera cart/Elink technology and with assistance from bedside nurse/clinical staff  Reason for Consult: right hand IV infiltration and associated phlebitis  Wound type: IV site with phlebitis. Bedside nurse reports the IV was not in use. They removed the IV last night Pressure Injury POA: NA Measurement:muitiple intact bulla over the dorsal hand and a few small ones on the forearm.  On ruptured bulla, with partial thickness skin loss on the dorsal hand Wound bed: see above, the area with skin loss is pink and clean  Drainage (amount, consistency, odor) minimal on dressing removed by bedside staff Periwound:edematous, documented palpable pulses and good cap return. Dressing procedure/placement/frequency: Single layer of xeroform to the dorsal hand/blisters. Cover with dry dressing. Change every other day.   Should the blistering progress or the edema increase would recommend consultation by hand surgeon.   Discussed POC with patient and bedside nurse.  Re consult if needed, will not follow at this time. Thanks  Mikle Sternberg [employer], RN,CWOCN, CNS, CWON-AP [phone])

## 2018-10-04 NOTE — Progress Notes (Signed)
STROKE TEAM PROGRESS NOTE   INTERVAL HISTORY Patient remains neurologically stable and is able to follow commands and move the right side purposefully.  He is developed pain swelling and redness of the right upper extremity particularly the dorsal aspect of the hand and forearm.  He also has bullous lesions likely from cellulitis from IV infiltration.  He complains of significant tenderness to touch anywhere in his right upper extremity  Vitals:   10/04/18 0500 10/04/18 0600 10/04/18 0700 10/04/18 0806  BP: 130/78 134/84 134/78 140/80  Pulse: 69 67 71 68  Resp: 18 18 18 18   Temp:      TempSrc:      SpO2: 100% 100% 100% 100%  Weight:      Height:        CBC:  Recent Labs  Lab 2018/10/23 1248  09/28/18 0224  10/02/18 0557  10/03/18 1153 10/03/18 2014  WBC 7.3  --  9.7   < > 6.7  --  8.6  --   NEUTROABS 5.8  --  8.1*  --   --   --   --   --   HGB 12.5*   < > 11.9*   < > 10.2*   < > 10.6* 9.2*  HCT 38.7*   < > 37.0*   < > 34.6*   < > 34.1* 27.0*  MCV 97.7  --  97.1   < > 105.5*  --  100.0  --   PLT 241  --  187   < > 151  --  PLATELET CLUMPS NOTED ON SMEAR, UNABLE TO ESTIMATE  --    < > = values in this interval not displayed.    Basic Metabolic Panel:  Recent Labs  Lab 09/28/18 0224  10/02/18 0557  10/03/18 0630  10/03/18 2014 10/03/18 2312 10/04/18 0516  NA 139   < > 160*   < > 159*   < > 160* 159* 158*  K 3.9   < > 3.8   < > 4.2  --  3.6  --   --   CL 107   < > 124*  --  121*  --   --   --   --   CO2 20*   < > 27  --  25  --   --   --   --   GLUCOSE 186*   < > 204*  --  251*  --   --   --   --   BUN 25*   < > 32*  --  36*  --   --   --   --   CREATININE 1.51*   < > 1.75*  --  2.22*  --   --   --   --   CALCIUM 8.4*   < > 8.6*  --  8.4*  --   --   --   --   MG 1.8  --  2.4  --   --   --   --   --   --   PHOS 3.3  --  3.2  --  5.1*  --   --   --   --    < > = values in this interval not displayed.    IMAGING past 24h No results found.   PHYSICAL EXAM   General  -obese middle-aged African-American male who is intubated but not on sedation. Right upper extremity bullous lesions with hand swelling and tenderness to  touch Cardiovascular - Regular rate and rhythm, not in afib.  Neuro -   intubated.  Not sedated.  Drowsy but can be easily aroused. able to following simple commands on the right. , eyes deviated to the right with left gaze palsy., pupil bilateral 2.5 mm, sluggish reaction to light. No blinking to visual threat bilaterally, right gaze with sustained nystagmus, left gaze palsy. Corneal reflex weak on the left but present on the right, gag and cough present.  Bilateral facial weakness. Tongue midline in mouth. Spontaneous moving RUE and RLE well against gravity, RUE 3-/5, RLE 2/5. LUE 0/5 and LLE slight withdraw on pain stimulation. DTR 1+ and left babinski. Sensation and coordination not cooperative and gait not tested.   ASSESSMENT/PLAN Mr. SUNJAY ANIELLO is a 48 y.o. male with history of HTN, DB, CHF and AF on Xarelto found down at work, presenting with L hemiparesis and R gaze.   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 Early infarct R BG and insula.    CTA head & neck ELO R ICA, R M1, R M2. R A1 clot.   Cerebral angio R M1 TICI2b reperfusion  CT head hyperdensity R BG, contrast vs hmg. No mass effect  MRI  Large R MCA infarct, associated edema and mass effect w/o shift. prob small R cerebellar infarct. Small vessel disease.   MRA  Normal, previous R M1 open  2D Echo EF 60-65%. No source of embolus  Neurologic worsening 10/02/18, decreased LOC and sonorous breathing w/ aphasia likely due to inability to protect airway and slight increase in cytotoxic edema  CT head - R MCA R BG, perisylvian frontal and temporal lobes stable. Stable petechial hemorrhage. No new stroke. Increased edema and mass effect  10 mm R to L shift and effacement R lat ventricle (was 6 mm)  CTA head - no LVO, patent circ  CTA neck - no LVO or sign stenosis. Increased ground-glass opacification upper R lung  LDL 73  HgbA1c 8.9  Heparin 5000 units sq tid or VTE prophylaxis Xarelto (rivaroxaban) daily prior to admission, now on No antithrombotic.  On ASA 325, consider resume DOAC once able to swallow or permanent tube placed  Therapy recommendations: CIR  Disposition:  pending   Cerebral edema - increasing midline shift Induced hypernatremia  09/28/2018 pt with right ptosis, right pupil dilation with reserved reflex, right gaze sustained nystagmus -> 09/29/2018 bilateral proptosis, minimal extraocular movement bilaterally, pupil equal sluggish to light, bilateral facial weakness, dysphagia  MRI right temporal pole edema with cerebral peduncle compression  CT repeat 09/29/2018- Increasing regional mass effect and edema with associated 4 mm right-to-left shift.   MRI repeat increased MLS to 40mm, brainstem compression stable  Treated with 3% protocol, drip off as at goal  Na 158.   Goal Na 150-155  Off free water 300, now on 1/2 NS given increased edema  Continue to monitor  Cardiac Arrest 4/14  While in CT 4/14 for decreased LOC and respiratory compromise  epinephine & compressions  stabilized  Acute on Chronic Respiratory Failure w/ hypoxia and hypercapnia  OSA  Initial intubation in ED for IR  Extubated 4/10  reintubated 4/11 d/t increased WOB  Extubated 4/14  reintubated 4/15 d/t diminished cough and gag, snoring  trach planned for Mon w/ Trauma  CCM onboard  Atrial Fibrillation w/ RVR &  VT  Home anticoagulation:  Xarelto (rivaroxaban) daily  . Home meds: amiodarone 200 . On ASA 325  Consider resume DOAC once able to swallow or permanent tube placed   Chronic combined systolic and diastolic CHF   Home meds: Coreg 37.5 bid, lasix 60 bid, hydralazine 50 bid, isosorbide 30,  entresto bid, spironolactone 25  CXR 10/03/2018 lungs clear  resumed home meds - Coreg 25 bid, lasix 60 bid, hydralazine 50 bid, isosorbide 30, entresto bid, spironolactone 25  Fever   Tmax 100.5  Blood culture no growth    urine culture no growth   resp culture normal respiratory flora  Continue hydration  CXR  Improving airspace R lung base. Resolved right pleural effusion  Unasyn 4/12>>  CCM on board  Dysphagia  Secondary to stroke  On tube feeds via Cortrak  SLP following   PEG placement planned for Mon w/ Trauma  Urinary Retention  Foley inserted 4/14 after multiple I&O  Hypertension  lower  Home meds:  Coreg 37.5 bid, lasix 60 bid, hydralazine 50 bid, isosorbide 30, entresto bid, spironolactone 25  BP goal < 180  Off cleviprex  On lasix 60 bid, hydralazine 50 bid, isosorbide 30, entresto bid, spironolactone 25 . Coreg d/c'd 4/15 . Long-term BP goal normotensive  Hyperlipidemia  Home meds:  No statin  LDL 73, goal < 70  Add lipitor 20  Continue statin at discharge  Diabetes type II uncontrolled  Home meds:  Metformin 500 bid, insulin degludec 8u  HgbA1c 8.9, goal < 7.0  Hyperglycemia  CCM on board  Lantus to 18u q hs  SSI novolog 0-15 q4h  Novolog 6u QID for TF coverage  Hold off metformin due to elevated Cre  CBGs  Other Stroke Risk Factors  Obesity, Body mass index is 30.5 kg/m., recommend weight loss, diet and exercise as appropriate   Obstructive sleep apnea  Nonischemic CM  Other Active Problems  AKI on CKD stage II Cr 1.67->1.75->2.22 - recheck   Normocytic Anemia 12.5->10.2, MCV 105.5 - VB 12 normal and folate pending   Thrombocytopenia, resolved 241->151 RUE phlebitis/cellulitis, likely d/t meds received during code blue.   Hospital day # 7 Patient has now developed right hand and forearm pain and swelling with bullous lesion likely due to cellulitis from infiltrated IV.  Discussed with Dr. Janee Mornhompson will  postpone trach and PEG till next week and follow his right upper extremity and hand cellulitis carefully.  Discussed with patient, Dr. Janee Mornhompson and Dr. Everardo AllEllison critical care medicine.This patient is critically ill and at significant risk of neurological worsening, death and care requires constant monitoring of vital signs, hemodynamics,respiratory and cardiac monitoring, extensive review of multiple databases, frequent neurological assessment, discussion with family, other specialists and medical decision making of high complexity.I have made any additions or clarifications directly to the above note.This critical care time does not reflect procedure time, or teaching time or supervisory time of PA/NP/Med Resident etc but could involve care discussion time.  I spent 40 minutes of neurocritical care time  in the care of  this patient.      Delia HeadyPramod Toretto Tingler, MD Medical Director [facility] Pager: [phone] 10/04/2018 10:04 AM   To contact Stroke Continuity provider, please refer to [email or web address]. After hours, contact General Neurology

## 2018-10-04 NOTE — Progress Notes (Signed)
Assisted tele visit to patient with wife.  Dondrea Clendenin Ferrer, RN  

## 2018-10-04 NOTE — Progress Notes (Signed)
Inpatient Diabetes Program Recommendations  AACE/ADA: New Consensus Statement on Inpatient Glycemic Control (2015)  Target Ranges:  Prepandial:   less than 140 mg/dL      Peak postprandial:   less than 180 mg/dL (1-2 hours)      Critically ill patients:  140 - 180 mg/dL   Lab Results  Component Value Date   GLUCAP 177 (H) 10/04/2018   HGBA1C 8.9 (H) 09/28/2018    Review of Glycemic Control Results for Johnathan Archer, Johnathan Archer (MRN [id number]) as of 10/04/2018 11:58  Ref. Range 10/03/2018 19:50 10/03/2018 23:26 10/04/2018 04:10 10/04/2018 07:59 10/04/2018 11:23  Glucose-Capillary Latest Ref Range: 70 - 99 mg/dL 034 (H) 742 (H) 595 (H) 213 (H) 177 (H)   Diabetes history: DM 2 Current orders for Inpatient glycemic control:  Lantus 25 units q HS, Novolog 6 units q 6 hours, Novolog moderate q 4 hours Inpatient Diabetes Program Recommendations: Please consider changing tube feed coverage to Novolog 6 units q 4 hours to correspond with the Novolog correction.    Thanks,  Beryl Meager, RN, BC-ADM Inpatient Diabetes Coordinator Pager [phone]

## 2018-10-04 NOTE — Consult Note (Signed)
ORTHOPAEDIC CONSULTATION HISTORY & PHYSICAL REQUESTING PHYSICIAN: Micki Riley, MD  Chief Complaint: right hand IV infiltration  HPI: Johnathan Archer is a 48 y.o. male who is in the ICU, presently intubated, having been resuscitated yesterday, having presented as a code stroke.  He has been noted to have dorsal hand swelling and progressive edema, with the development of bullae.  Wound consult was placed this morning.    Past Medical History:  Diagnosis Date   Atrial fibrillation with RVR (HCC) 10/2011   lone episode, converted with IV dilt, on coumadin given elevated CHADS2   Chest pain 03/06/2015   Myoview 07/2018: EF 33, no ischemia (EF normal by recent echo)   Chronic combined systolic and diastolic CHF (congestive heart failure) (HCC)    Echo 2014: EF 20-25 // Echo 07/2018: mild LVE, EF 55, normal RVSF, mod LAE, mod MR, mild PI, mild aortic root dilation (38 mm)   ED (erectile dysfunction)    History of chicken pox    History of noncompliance with medical treatment 03/06/2015   Hypertension    Hypertensive heart disease with heart failure (HCC) 03/28/2016   Hyperthyroidism 03/15/2016   Long term current use of anticoagulant therapy 01/14/2013   Mitral regurgitation    Moderate by echo 07/2011   Nonischemic cardiomyopathy (HCC) 12/27/2012   Normocytic anemia 03/15/2016   Obesity (BMI 30-39.9) 01/21/2014   OSA (obstructive sleep apnea)    Paroxysmal atrial fibrillation (HCC) 10/19/2011   lone episode, converted with IV dilt, on coumadin given elevated CHADS2    Systolic and diastolic CHF, chronic (HCC) 2004   a) Idiopathic dilated CM, 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  cath 2004 negative for obstruction, last echo 07/2011 EF 35%. b) Normal coronary arteries by cath in 2004 (when EF was noted at 15%). c) Last EF 20% by echo 12/2012   Type II diabetes mellitus, uncontrolled (HCC)    Type II or unspecified type diabetes mellitus without mention of  complication, uncontrolled    Warfarin anticoagulation    Past Surgical History:  Procedure Laterality Date   CARDIAC CATHETERIZATION  2004   no blockages   HERNIA REPAIR  2002   IR ANGIO VERTEBRAL SEL SUBCLAVIAN INNOMINATE UNI R MOD SED  Oct 08, 2018   IR CT HEAD LTD  10/08/2018   IR PERCUTANEOUS ART THROMBECTOMY/INFUSION INTRACRANIAL INC DIAG ANGIO  10/08/2018   KNEE SURGERY     L knee in HS   RADIOLOGY WITH ANESTHESIA [name] 10/08/2018   Procedure: IR WITH ANESTHESIA;  Surgeon: Julieanne Cotton, MD;  Location: [facility] OR;  Service: Radiology;  Laterality: [name];   US ECHOCARDIOGRAPHY  07/2011   mod LVH, EF 35%, diffuse hypokinesis of entire myocardium, irreversible restrictive pattern (grade 4 diastolic dysfunction), mod MR, mod dilated LA/RA, decreased RV systolic fxn, no PFO   US ECHOCARDIOGRAPHY  12/2012   severe LVH, EF 20-25%, mod MR, reduced RV systolic function   Social History   Socioeconomic History   Marital status: Married    Spouse name: Not on file   Number of children: Not on file   Years of education: Not on file   Highest education level: Not on file  Occupational History   Not on file  Social Needs   Financial resource strain: Not on file   Food insecurity:    Worry: Not on file    Inability: Not on file   Transportation needs:    Medical: Not on file    Non-medical: Not on file  Tobacco Use   Smoking status: Never Smoker   Smokeless tobacco: Never Used  Substance and Sexual Activity   Alcohol use: No    Alcohol/week: 0.0 standard drinks    Comment: Occasional 1x/mo   Drug use: No   Sexual activity: Not on file  Lifestyle   Physical activity:    Days per week: Not on file    Minutes per session: Not on file   Stress: Not on file  Relationships   Social connections:    Talks on phone: Not on file    Gets together: Not on file    Attends religious service: Not on file    Active member of club or organization: Not on file    Attends  meetings of clubs or organizations: Not on file    Relationship status: Not on file  Other Topics Concern   Not on file  Social History Narrative   Caffeine: none   Lives with wife and daughter (8yo), no pets   Occupation: Naval architect   Edu: 19yr college   Activity: works, no regular activity   Diet: water daily, tries fruits/vegetables daily   Family History  Problem Relation Age of Onset   Diabetes Mother    Hypertension Father    Stroke Neg Hx    Coronary artery disease Neg Hx    Cancer Neg Hx    Heart attack Neg Hx    Thyroid disease Neg Hx    No Known Allergies Prior to Admission medications   Medication Sig Start Date End Date Taking? Authorizing Provider  amiodarone (PACERONE) 200 MG tablet Take 1 tablet (200 mg total) by mouth daily. 07/25/18  Yes Weaver, Scott T, PA-C  carvedilol (COREG) 25 MG tablet Take 1.5 tablets (37.5 mg total) by mouth 2 (two) times daily with a meal. 08/21/18  Yes Weaver, Scott T, PA-C  fexofenadine (ALLEGRA) 180 MG tablet Take 180 mg by mouth daily as needed for allergies or rhinitis.   Yes [provider]  furosemide (LASIX) 40 MG tablet Take 1.5 tablets (60 mg total) by mouth 2 (two) times daily. 08/08/18 11/06/18 Yes Weaver, Scott T, PA-C  hydrALAZINE (APRESOLINE) 50 MG tablet Take 1 tablet (50 mg total) by mouth 2 (two) times daily. 09/17/18  Yes Weaver, Scott T, PA-C  Insulin Glargine, 1 Unit Dial, (TOUJEO SOLOSTAR) 300 UNIT/ML SOPN Inject into the skin.   Yes [provider]  ipratropium (ATROVENT) 0.03 % nasal spray Place 2 sprays into both nostrils 2 (two) times daily.  08/30/18  Yes [provider]  isosorbide mononitrate (IMDUR) 30 MG 24 hr tablet Take 1 tablet (30 mg total) by mouth daily. 08/21/18  Yes Weaver, Scott T, PA-C  metFORMIN (GLUCOPHAGE) 500 MG tablet Take 1 tablet (500 mg total) by mouth 2 (two) times daily with a meal. Patient taking differently: Take 750 mg by mouth 2 (two) times daily with a  meal.  10/27/15  Yes Rose, Kayla, PA-C  sacubitril-valsartan (ENTRESTO) 49-51 MG Take 1 tablet by mouth 2 (two) times daily. 08/08/18  Yes Tereso Newcomer T, PA-C  spironolactone (ALDACTONE) 25 MG tablet Take 1 tablet (25 mg total) by mouth daily. 08/23/18  Yes Weaver, Scott T, PA-C  XARELTO 20 MG TABS tablet TAKE 1 TABLET BY MOUTH ONCE DAILY WITH SUPPER Patient taking differently: Take 20 mg by mouth daily with breakfast.  07/25/18  Yes Weaver, Scott T, PA-C  glucose blood test strip 1 each by Other route as needed for other. Check  cbg in morning and 2 hours after largest meal of day.    [provider]  Insulin Pen Needle 31G X 6 MM MISC by Does not apply route. Use for daily insulin injection.    [provider]   Ct Angio Head W Or Wo Contrast  Result Date: 10/02/2018 CLINICAL DATA:  48 y/o M; follow-up of stroke post intra-arterial revascularization. EXAM: CTA HEAD WITHOUT CONTRAST CT ANGIOGRAPHY HEAD AND NECK TECHNIQUE: Multidetector CT imaging of the head and neck was performed using the standard protocol during bolus administration of intravenous contrast. Multiplanar CT image reconstructions and MIPs were obtained to evaluate the vascular anatomy. Carotid stenosis measurements (when applicable) are obtained utilizing NASCET criteria, using the distal internal carotid diameter as the denominator. CONTRAST:  OMNIPAQUE IOHEXOL 350 MG/ML SOLN COMPARISON:  09/30/2018 MRI head. 09/21/2018 CTA head and neck. 10/16/2018 cerebral angiogram. 09/28/2018 MRA head. FINDINGS: CT HEAD FINDINGS Brain: Large right MCA distribution infarction involving the right basal ganglia, right insula, right lateral frontal lobe, and right temporal lobe with additional small foci of infarction throughout the right superior frontal and parietal cortices. The distribution of infarction is stable from prior MRI of the head given differences in technique. Edema and mass effect results in effacement of the right  lateral ventricle and 10 mm of right-to-left midline shift. There are faint densities throughout the region of infarction corresponding to petechial hemorrhage on the prior MRI of the head without interval change given differences in technique. No new acute stroke, mass effect, or downward herniation at this time. Additional very small infarctions scattered throughout the cerebral hemispheres seen on the prior MRI are not appreciable on CT. Vascular: As below. Skull: Normal. Negative for fracture or focal lesion. Sinuses: Mild mucosal thickening of ethmoid, sphenoid, and maxillary sinuses. Normal aeration of the mastoid air cells. Orbits are unremarkable. Orbits: No acute finding. Review of the MIP images confirms the above findings CTA NECK FINDINGS Aortic arch: Standard branching. Imaged portion shows no evidence of aneurysm or dissection. No significant stenosis of the major arch vessel origins. Right carotid system: Motion artifact obscures the carotid bifurcation. No evidence of dissection, stenosis (50% or greater) or occlusion in the visible carotid system. Left carotid system: Motion artifact obscures the carotid bifurcation. No evidence of dissection, stenosis (50% or greater) or occlusion in the visible carotid system. Vertebral arteries: Codominant. Segments of the vertebral arteries are obscured by motion artifact. No evidence of dissection, stenosis (50% or greater) or occlusion of the visible vertebral artery systems. Skeleton: Negative. Other neck: Negative. Upper chest: Increased ground-glass opacities within the right upper lung. Review of the MIP images confirms the above findings CTA HEAD FINDINGS Anterior circulation: No significant stenosis, proximal occlusion, aneurysm, or vascular malformation. Posterior circulation: No significant stenosis, proximal occlusion, aneurysm, or vascular malformation. Venous sinuses: As permitted by contrast timing, patent. Anatomic variants: None significant.  Delayed phase: No abnormal intracranial enhancement. Review of the MIP images confirms the above findings IMPRESSION: CT head: 1. 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 Stable petechial hemorrhage. No new stroke or hemorrhage identified. 2. Increased edema and mass effect with 10 mm right-to-left midline shift and effacement of right lateral ventricle, previously 6 mm right-to-left midline shift. No downward herniation at this time. CTA neck: 1. Patent carotid and vertebral arteries. No dissection, aneurysm, or hemodynamically significant stenosis  utilizing NASCET criteria. 2. Increased ground-glass opacification in the upper right lung, suspected neurogenic edema or possibly pneumonitis. CTA head: 1. Patent anterior and posterior intracranial circulation. No new large vessel occlusion, aneurysm, or significant stenosis. Electronically Signed   By: Mitzi Hansen M.D.   On: 10/02/2018 23:41   Ct Head Wo Contrast  Result Date: 10/02/2018 CLINICAL DATA:  48 y/o M; follow-up of stroke post intra-arterial revascularization. EXAM: CTA HEAD WITHOUT CONTRAST CT ANGIOGRAPHY HEAD AND NECK TECHNIQUE: Multidetector CT imaging of the head and neck was performed using the standard protocol during bolus administration of intravenous contrast. Multiplanar CT image reconstructions and MIPs were obtained to evaluate the vascular anatomy. Carotid stenosis measurements (when applicable) are obtained utilizing NASCET criteria, using the distal internal carotid diameter as the denominator. CONTRAST:  OMNIPAQUE IOHEXOL 350 MG/ML SOLN COMPARISON:  09/30/2018 MRI head. 10-12-2018 CTA head and neck. 12-Oct-2018 cerebral angiogram. 09/28/2018 MRA head. FINDINGS: CT HEAD FINDINGS Brain: Large right MCA distribution infarction involving the right  basal ganglia, right insula, right lateral frontal lobe, and right temporal lobe with additional small foci of infarction throughout the right superior frontal and parietal cortices. The distribution of infarction is stable from prior MRI of the head given differences in technique. Edema and mass effect results in effacement of the right lateral ventricle and 10 mm of right-to-left midline shift. There are faint densities throughout the region of infarction corresponding to petechial hemorrhage on the prior MRI of the head without interval change given differences in technique. No new acute stroke, mass effect, or downward herniation at this time. Additional very small infarctions scattered throughout the cerebral hemispheres seen on the prior MRI are not appreciable on CT. Vascular: As below. Skull: Normal. Negative for fracture or focal lesion. Sinuses: Mild mucosal thickening of ethmoid, sphenoid, and maxillary sinuses. Normal aeration of the mastoid air cells. Orbits are unremarkable. Orbits: No acute finding. Review of the MIP images confirms the above findings CTA NECK FINDINGS Aortic arch: Standard branching. Imaged portion shows no evidence of aneurysm or dissection. No significant stenosis of the major arch vessel origins. Right carotid system: Motion artifact obscures the carotid bifurcation. No evidence of dissection, stenosis (50% or greater) or occlusion in the visible carotid system. Left carotid system: Motion artifact obscures the carotid bifurcation. No evidence of dissection, stenosis (50% or greater) or occlusion in the visible carotid system. Vertebral arteries: Codominant. Segments of the vertebral arteries are obscured by motion artifact. No evidence of dissection, stenosis (50% or greater) or occlusion of the visible vertebral artery systems. Skeleton: Negative. Other neck: Negative. Upper chest: Increased ground-glass opacities within the right upper lung. Review of the MIP images confirms  the above findings CTA HEAD FINDINGS Anterior circulation: No significant stenosis, proximal occlusion, aneurysm, or vascular malformation. Posterior circulation: No significant stenosis, proximal occlusion, aneurysm, or vascular malformation. Venous sinuses: As permitted by contrast timing, patent. Anatomic variants: None significant. Delayed phase: No abnormal intracranial enhancement. Review of the MIP images confirms the above findings IMPRESSION: CT head: 1. 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 Stable petechial hemorrhage. No new stroke or hemorrhage identified. 2. Increased edema and mass effect with 10 mm right-to-left midline shift and effacement of right lateral ventricle, previously 6 mm right-to-left midline shift. No downward herniation at this time. CTA neck: 1. Patent carotid and vertebral arteries. No dissection,  aneurysm, or hemodynamically significant stenosis utilizing NASCET criteria. 2. Increased ground-glass opacification in the upper right lung, suspected neurogenic edema or possibly pneumonitis. CTA head: 1. Patent anterior and posterior intracranial circulation. No new large vessel occlusion, aneurysm, or significant stenosis. Electronically Signed   By: Mitzi Hansen M.D.   On: 10/02/2018 23:41   Ct Angio Neck W Or Wo Contrast  Result Date: 10/02/2018 CLINICAL DATA:  48 y/o M; follow-up of stroke post intra-arterial revascularization. EXAM: CTA HEAD WITHOUT CONTRAST CT ANGIOGRAPHY HEAD AND NECK TECHNIQUE: Multidetector CT imaging of the head and neck was performed using the standard protocol during bolus administration of intravenous contrast. Multiplanar CT image reconstructions and MIPs were obtained to evaluate the vascular anatomy. Carotid stenosis measurements (when applicable) are obtained  utilizing NASCET criteria, using the distal internal carotid diameter as the denominator. CONTRAST:  OMNIPAQUE IOHEXOL 350 MG/ML SOLN COMPARISON:  09/30/2018 MRI head. 10/18/2018 CTA head and neck. 09/24/2018 cerebral angiogram. 09/28/2018 MRA head. FINDINGS: CT HEAD FINDINGS Brain: Large right MCA distribution infarction involving the right basal ganglia, right insula, right lateral frontal lobe, and right temporal lobe with additional small foci of infarction throughout the right superior frontal and parietal cortices. The distribution of infarction is stable from prior MRI of the head given differences in technique. Edema and mass effect results in effacement of the right lateral ventricle and 10 mm of right-to-left midline shift. There are faint densities throughout the region of infarction corresponding to petechial hemorrhage on the prior MRI of the head without interval change given differences in technique. No new acute stroke, mass effect, or downward herniation at this time. Additional very small infarctions scattered throughout the cerebral hemispheres seen on the prior MRI are not appreciable on CT. Vascular: As below. Skull: Normal. Negative for fracture or focal lesion. Sinuses: Mild mucosal thickening of ethmoid, sphenoid, and maxillary sinuses. Normal aeration of the mastoid air cells. Orbits are unremarkable. Orbits: No acute finding. Review of the MIP images confirms the above findings CTA NECK FINDINGS Aortic arch: Standard branching. Imaged portion shows no evidence of aneurysm or dissection. No significant stenosis of the major arch vessel origins. Right carotid system: Motion artifact obscures the carotid bifurcation. No evidence of dissection, stenosis (50% or greater) or occlusion in the visible carotid system. Left carotid system: Motion artifact obscures the carotid bifurcation. No evidence of dissection, stenosis (50% or greater) or occlusion in the visible carotid system. Vertebral  arteries: Codominant. Segments of the vertebral arteries are obscured by motion artifact. No evidence of dissection, stenosis (50% or greater) or occlusion of the visible vertebral artery systems. Skeleton: Negative. Other neck: Negative. Upper chest: Increased ground-glass opacities within the right upper lung. Review of the MIP images confirms the above findings CTA HEAD FINDINGS Anterior circulation: No significant stenosis, proximal occlusion, aneurysm, or vascular malformation. Posterior circulation: No significant stenosis, proximal occlusion, aneurysm, or vascular malformation. Venous sinuses: As permitted by contrast timing, patent. Anatomic variants: None significant. Delayed phase: No abnormal intracranial enhancement. Review of the MIP images confirms the above findings IMPRESSION: CT head: 1. 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 Stable petechial hemorrhage. No new stroke or hemorrhage identified. 2. Increased edema and mass effect with 10 mm right-to-left midline shift and effacement of right lateral ventricle, previously 6 mm right-to-left midline shift. No downward herniation at this time. CTA neck:  1. Patent carotid and vertebral arteries. No dissection, aneurysm, or hemodynamically significant stenosis utilizing NASCET criteria. 2. Increased ground-glass opacification in the upper right lung, suspected neurogenic edema or possibly pneumonitis. CTA head: 1. Patent anterior and posterior intracranial circulation. No new large vessel occlusion, aneurysm, or significant stenosis. Electronically Signed   By: Mitzi HansenLance  Furusawa-Stratton M.D.   On: 10/02/2018 23:41   Dg Chest Port 1 View  Result Date: 10/03/2018 CLINICAL DATA:  Check tube placement EXAM: PORTABLE CHEST 1 VIEW COMPARISON:  10/02/2018 FINDINGS: Feeding catheter is  again noted extending into the stomach. Endotracheal tube is noted in satisfactory position. Cardiac shadow is stable. Lungs are well aerated. Mild asymmetric edema is noted on the right. IMPRESSION: Endotracheal tube in satisfactory position. Increasing asymmetric edema within the right lung. Electronically Signed   By: Alcide CleverMark  Lukens M.D.   On: 10/03/2018 00:46   Dg Chest Port 1 View  Result Date: 10/02/2018 CLINICAL DATA:  Increase shortness of breath. Altered mental status. EXAM: PORTABLE CHEST 1 VIEW COMPARISON:  Earlier same day FINDINGS: 2131 hours. Low lung volumes. The cardio pericardial silhouette is enlarged. There is pulmonary vascular congestion without overt pulmonary edema. No focal airspace consolidation or substantial pleural effusion. A feeding tube passes into the stomach although the distal tip position is not included on the film. Telemetry leads overlie the chest. IMPRESSION: Low volumes with cardiomegaly and vascular congestion. Electronically Signed   By: Kennith CenterEric  Mansell M.D.   On: 10/02/2018 21:38    Positive ROS: All other systems have been reviewed and were otherwise negative with the exception of those mentioned in the HPI and as above.  Physical Exam: Vitals: Refer to EMR. Intubated, not responsive to verbal commands. Lymphatic: No generalized extremity edema or lymphadenopathy Extremities / MSK:  The extremities are normal with respect to appearance, ranges of motion, joint stability, muscle strength/tone, sensation, & perfusion except as otherwise noted:  The right forearm is not significantly swollen.  The swelling begins in the very distal aspect of the forearm and involves mainly the dorsum of the hand.  It is not tense, but is generously swollen.  There are overlying bullae, 1 of which has sloughed its covering, revealing red injected underlying dermis consistent with partial thickness injury.  Radial pulses palpable, digits are sufficiently warm.  Assessment: Dorsal  right hand swelling.  Presently not consistent with compartment syndrome  Recommendations: Elevation of the hand higher than the elbow higher than the shoulder can be helpful in resolution of the subcutaneous edema.  Applying warmth proximal to the area involved can also help.  Likely will not require surgical debridement.  Continue wound care per WOC nursing, & if significantly large areas of full-thickness skin necrosis develop such that debridement and/or grafting may be beneficial, please reconsult.  Cliffton Astersavid A. Janee Mornhompson, MD      [facility]opaedic & Hand [facility] [address] [place], [place]  [id number] Office: [phone] Mobile: [phone]  10/04/2018, 5:01 PM

## 2018-10-05 LAB — CULTURE, RESPIRATORY W GRAM STAIN: Culture: NORMAL

## 2018-10-05 LAB — BASIC METABOLIC PANEL
Anion gap: 10 (ref 5–15)
BUN: 28 mg/dL — ABNORMAL HIGH (ref 6–20)
CO2: 25 mmol/L (ref 22–32)
Calcium: 8.3 mg/dL — ABNORMAL LOW (ref 8.9–10.3)
Chloride: 120 mmol/L — ABNORMAL HIGH (ref 98–111)
Creatinine, Ser: 1.62 mg/dL — ABNORMAL HIGH (ref 0.61–1.24)
GFR calc Af Amer: 57 mL/min — ABNORMAL LOW (ref >60)
GFR calc non Af Amer: 49 mL/min — ABNORMAL LOW (ref >60)
Glucose, Bld: 172 mg/dL — ABNORMAL HIGH (ref 70–99)
Potassium: 4 mmol/L (ref 3.5–5.1)
Sodium: 155 mmol/L — ABNORMAL HIGH (ref 135–145)

## 2018-10-05 LAB — CBC
HCT: 31.8 % — ABNORMAL LOW (ref 39.0–52.0)
Hemoglobin: 9 g/dL — ABNORMAL LOW (ref 13.0–17.0)
MCH: 30.2 pg (ref 26.0–34.0)
MCHC: 28.3 g/dL — ABNORMAL LOW (ref 30.0–36.0)
MCV: 106.7 fL — ABNORMAL HIGH (ref 80.0–100.0)
Platelets: 153 10*3/uL (ref 150–400)
RBC: 2.98 MIL/uL — ABNORMAL LOW (ref 4.22–5.81)
RDW: 13.7 % (ref 11.5–15.5)
WBC: 8.6 10*3/uL (ref 4.0–10.5)
nRBC: 0 % (ref 0.0–0.2)

## 2018-10-05 LAB — CULTURE, BLOOD (ROUTINE X 2)
Culture: NO GROWTH
Culture: NO GROWTH
Special Requests: ADEQUATE
Special Requests: ADEQUATE

## 2018-10-05 LAB — PROTIME-INR
INR: 1.2 (ref 0.8–1.2)
Prothrombin Time: 14.8 seconds (ref 11.4–15.2)

## 2018-10-05 LAB — GLUCOSE, CAPILLARY
Glucose-Capillary: 134 mg/dL — ABNORMAL HIGH (ref 70–99)
Glucose-Capillary: 137 mg/dL — ABNORMAL HIGH (ref 70–99)
Glucose-Capillary: 145 mg/dL — ABNORMAL HIGH (ref 70–99)
Glucose-Capillary: 147 mg/dL — ABNORMAL HIGH (ref 70–99)
Glucose-Capillary: 157 mg/dL — ABNORMAL HIGH (ref 70–99)
Glucose-Capillary: 157 mg/dL — ABNORMAL HIGH (ref 70–99)
Glucose-Capillary: 206 mg/dL — ABNORMAL HIGH (ref 70–99)

## 2018-10-05 LAB — CULTURE, RESPIRATORY

## 2018-10-05 LAB — SODIUM
Sodium: 156 mmol/L — ABNORMAL HIGH (ref 135–145)
Sodium: 149 mmol/L — ABNORMAL HIGH (ref 135–145)

## 2018-10-05 MED ORDER — LABETALOL HCL 5 MG/ML IV SOLN
10.0000 mg | INTRAVENOUS | Status: DC | PRN
  Administered 2018-10-05 – 2018-10-10 (×3): 20 mg via INTRAVENOUS
  Filled 2018-10-05 (×4): qty 4

## 2018-10-05 MED ORDER — HYDRALAZINE HCL 20 MG/ML IJ SOLN
10.0000 mg | INTRAMUSCULAR | Status: DC | PRN
  Administered 2018-10-05: 40 mg via INTRAVENOUS
  Administered 2018-10-05 – 2018-10-06 (×2): 20 mg via INTRAVENOUS
  Administered 2018-10-06 – 2018-10-07 (×2): 30 mg via INTRAVENOUS
  Administered 2018-10-09 – 2018-10-11 (×5): 20 mg via INTRAVENOUS
  Filled 2018-10-05: qty 2
  Filled 2018-10-05: qty 1
  Filled 2018-10-05 (×2): qty 2
  Filled 2018-10-05 (×2): qty 1
  Filled 2018-10-05 (×2): qty 2
  Filled 2018-10-05: qty 1
  Filled 2018-10-05: qty 2

## 2018-10-05 MED ORDER — INSULIN ASPART 100 UNIT/ML ~~LOC~~ SOLN
0.0000 [IU] | SUBCUTANEOUS | Status: DC
  Administered 2018-10-05: 3 [IU] via SUBCUTANEOUS

## 2018-10-05 MED ORDER — INSULIN ASPART 100 UNIT/ML ~~LOC~~ SOLN
6.0000 [IU] | Freq: Every day | SUBCUTANEOUS | Status: DC
  Administered 2018-10-05 – 2018-10-11 (×26): 6 [IU] via SUBCUTANEOUS

## 2018-10-05 MED ORDER — INSULIN ASPART 100 UNIT/ML ~~LOC~~ SOLN
0.0000 [IU] | SUBCUTANEOUS | Status: DC
  Administered 2018-10-05: 3 [IU] via SUBCUTANEOUS
  Administered 2018-10-06: 5 [IU] via SUBCUTANEOUS
  Administered 2018-10-06 (×2): 2 [IU] via SUBCUTANEOUS
  Administered 2018-10-06: 3 [IU] via SUBCUTANEOUS
  Administered 2018-10-07: 20:00:00 2 [IU] via SUBCUTANEOUS
  Administered 2018-10-07: 3 [IU] via SUBCUTANEOUS
  Administered 2018-10-07 – 2018-10-08 (×7): 2 [IU] via SUBCUTANEOUS
  Administered 2018-10-08: 09:00:00 3 [IU] via SUBCUTANEOUS
  Administered 2018-10-08: 2 [IU] via SUBCUTANEOUS
  Administered 2018-10-09 (×2): 3 [IU] via SUBCUTANEOUS
  Administered 2018-10-09 – 2018-10-10 (×6): 2 [IU] via SUBCUTANEOUS
  Administered 2018-10-10 (×3): 3 [IU] via SUBCUTANEOUS
  Administered 2018-10-10 – 2018-10-11 (×2): 2 [IU] via SUBCUTANEOUS

## 2018-10-05 MED ORDER — INSULIN ASPART 100 UNIT/ML ~~LOC~~ SOLN
6.0000 [IU] | Freq: Every day | SUBCUTANEOUS | Status: DC
  Administered 2018-10-05: 6 [IU] via SUBCUTANEOUS

## 2018-10-05 NOTE — Plan of Care (Signed)
Goal no longer appropriate

## 2018-10-05 NOTE — Progress Notes (Signed)
Assisted tele visit to patient with wife and daughter.  Tyaire Odem William, RN  

## 2018-10-05 NOTE — Progress Notes (Signed)
STROKE TEAM PROGRESS NOTE   INTERVAL HISTORY Patient remains neurologically unchanged.  He is drowsy but arousable and follows commands.  His right upper extremity remains swollen and painful and tense.  He was seen by hand surgeon and wound nurse appreciate their recommendations.  Serum sodium is coming down to 155 and creatinine is improving Vitals:   10/05/18 0800 10/05/18 0805 10/05/18 0830 10/05/18 0833  BP: (!) 189/92 (!) 189/92 (!) 199/91 (!) 199/91  Pulse: 72 72 73   Resp: Temp:      TempSrc:      SpO2: 100% 100% 100%   Weight:      Height:        CBC:  Recent Labs  Lab 10/04/18 1104 10/05/18 0626  WBC 6.9 8.6  HGB 8.7* 9.0*  HCT 30.5* 31.8*  MCV 105.9* 106.7*  PLT 129* 153    Basic Metabolic Panel:  Recent Labs  Lab 10/02/18 0557  10/03/18 0630  10/04/18 1104 10/04/18 1741 10/05/18 0626  NA 160*   < > 159*   < > 157* 156* 155*  K 3.8   < > 4.2   < > 3.8  --  4.0  CL 124*  --  121*  --  123*  --  120*  CO2 27  --  25  --  28  --  25  GLUCOSE 204*  --  251*  --  230*  --  172*  BUN 32*  --  36*  --  36*  --  28*  CREATININE 1.75*  --  2.22*  --  1.86*  --  1.62*  CALCIUM 8.6*  --  8.4*  --  7.9*  --  8.3*  MG 2.4  --   --   --   --   --   --   PHOS 3.2  --  5.1*  --   --   --   --    < > = values in this interval not displayed.    IMAGING past 24h No results found.   PHYSICAL EXAM  : General -obese middle-aged African-American male who is intubated but not on sedation. Right upper extremity bullous lesions with hand swelling and tenderness to touch Cardiovascular - Regular rate and rhythm, not in afib.  Neurological Exam :  -   intubated.  Not sedated.  Drowsy but can be easily aroused. able to following simple commands on the right. , eyes deviated to the right with left gaze palsy., pupil bilateral 2.5 mm, sluggish reaction to light. No blinking to visual threat bilaterally, right gaze with sustained nystagmus, left gaze palsy. Corneal  reflex weak on the left but present on the right, gag and cough present.  Bilateral facial weakness. Tongue midline in mouth. Spontaneous moving RUE and RLE well against gravity, RUE 3-/5, RLE 2/5. LUE 0/5 and LLE slight withdraw on pain stimulation. DTR 1+ and left babinski. Sensation and coordination not cooperative and gait not tested.   ASSESSMENT/PLAN Johnathan Archer is a 48 y.o. male with history of HTN, DB, CHF and AF on Xarelto found down at work, presenting with L hemiparesis and R gaze.   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 Early infarct R BG and insula.    CTA head & neck ELO R ICA, R  M1, R M2. R A1 clot.   Cerebral angio R M1 TICI2b reperfusion  CT head hyperdensity R BG, contrast vs hmg. No mass effect  MRI  Large R MCA infarct, associated edema and mass effect w/o shift. prob small R cerebellar infarct. Small vessel disease.   MRA  Normal, previous R M1 open  2D Echo EF 60-65%. No source of embolus  Neurologic worsening 10/02/18, decreased LOC and sonorous breathing w/ aphasia likely due to inability to protect airway and slight increase in cytotoxic edema  CT head - R MCA R BG, perisylvian frontal and temporal lobes stable. Stable petechial hemorrhage. No new stroke. Increased edema and mass effect 10 mm R to L shift and effacement R lat ventricle (was 6 mm)  CTA head - no LVO, patent circ  CTA neck - no LVO or sign stenosis. Increased ground-glass opacification upper R lung  LDL 73  HgbA1c 8.9  Heparin 5000 units sq tid or VTE prophylaxis Xarelto (rivaroxaban) daily prior to admission, now on No antithrombotic.  On ASA 325, consider resume DOAC once able to swallow or permanent tube placed  Therapy recommendations: CIR  Disposition:  pending   Cerebral edema  Induced hypernatremia  09/28/2018 pt with right ptosis,  right pupil dilation with reserved reflex, right gaze sustained nystagmus -> 09/29/2018 bilateral proptosis, minimal extraocular movement bilaterally, pupil equal sluggish to light, bilateral facial weakness, dysphagia  MRI right temporal pole edema with cerebral peduncle compression  CT repeat 09/29/2018- Increasing regional mass effect and edema with associated 4 mm right-to-left shift.   MRI repeat increased MLS to 1mm, brainstem compression stable  Treated with 3% protocol, drip off as at goal  Na 155   free water and 1/2 NS for gradual lowering Na  Continue to monitor  Cardiac Arrest 4/14  While in CT 4/14 for decreased LOC and respiratory compromise  epinephine & compressions  stabilized  Acute on Chronic Respiratory Failure w/ hypoxia and hypercapnia  OSA  Initial intubation in ED for IR  Extubated 4/10  reintubated 4/11 d/t increased WOB  Extubated 4/14  reintubated 4/15 d/t diminished cough and gag, snoring  trach planned for Mon w/ Trauma  CXR Sat am  CCM onboard  Atrial Fibrillation w/ RVR & VT  Home anticoagulation:  Xarelto (rivaroxaban) daily  . Home meds: amiodarone 200 . On ASA 325  Consider resume DOAC once able to swallow or permanent tube placed   Chronic combined systolic and diastolic CHF   Home meds: Coreg 37.5 bid, lasix 60 bid, hydralazine 50 bid, isosorbide 30, entresto bid, spironolactone 25  CXR 09/26/2018 lungs clear  resumed home meds - Coreg 25 bid, lasix 60 bid, hydralazine 50 bid, isosorbide 30, entresto bid, spironolactone 25  Fever   Tmax 100.1  Blood culture no growth    urine culture no growth   resp culture normal respiratory flora  Continue hydration  CXR  Improving airspace R lung base. Resolved right pleural effusion  Unasyn 4/12>>4/16   CCM on board  Dysphagia  Secondary to stroke  On tube feeds via Cortrak  SLP following   PEG placement planned for Mon w/ Trauma  Urinary Retention  Foley  inserted 4/14 after multiple I&O  Hypertension  lower  Home meds:  Coreg 37.5 bid, lasix 60 bid, hydralazine 50 bid, isosorbide 30, entresto bid, spironolactone 25  BP goal < 180  Off cleviprex  On hydralazine 100 bid, isosorbide 10 tid . Long-term BP goal normotensive  Hyperlipidemia  Home  meds:  No statin  LDL 73, goal < 70  Add lipitor 20  Continue statin at discharge  Diabetes type II uncontrolled  Home meds:  Metformin 500 bid, insulin degludec 8u  HgbA1c 8.9, goal < 7.0  Hyperglycemia  CCM on board  Lantus 25u q hs  SSI novolog 0-15 q4h  Change Novolog 6u to q4h for TF coverage   Hold off metformin due to elevated Cre  CBGs q4  Other Stroke Risk Factors  Obesity, Body mass index is 30.5 kg/m., recommend weight loss, diet and exercise as appropriate   Obstructive sleep apnea  Nonischemic CM  Other Active Problems  AKI on CKD stage II Cr 1.67->1.75->2.22->1.62  Normocytic Anemia 12.5->10.2->9.0, MCV 105.5 - VB 12 normal and folate normal  Thrombocytopenia 241->151->153  Dorsal RUE hand and forearm w/ bulbous lesion - phlebitis/cellulitis - likely d/t med infiltration during code blue - tender - WOC RN recommended xeroform to area and change qod - Hand surgeon Janee Morn) does not feel c/w compartment syndrome, recommended elevation and warmth. No surgical intervention. If necrosis develops, please reconsult. - new blisters developed post consult - warm compresses and vascular checks to hand  Hospital day # 8  Continue conservative management for right upper extremity cellulitis as per wound nurse and surgeon.  Hold off on antibiotics for now.  Likely trach and PEG if patient is stable.  Discussed with patient and critical care team PA.This patient is critically ill and at significant risk of neurological worsening, death and care requires constant monitoring of vital signs, hemodynamics,respiratory and cardiac monitoring, extensive review of  multiple databases, frequent neurological assessment, discussion with family, other specialists and medical decision making of high complexity.I have made any additions or clarifications directly to the above note.This critical care time does not reflect procedure time, or teaching time or supervisory time of PA/NP/Med Resident etc but could involve care discussion time.  I spent 30 minutes of neurocritical care time  in the care of  this patient.      Delia Heady, MD Medical Director [facility] Pager: [phone] 10/05/2018 9:08 AM   To contact Stroke Continuity provider, please refer to [email or web address]. After hours, contact General Neurology

## 2018-10-05 NOTE — Progress Notes (Signed)
PT Cancellation Note  Patient Details Name: Johnathan Archer MRN: [id number] DOB: [birth year]   Cancelled Treatment:    Reason Eval/Treat Not Completed: Patient not medically ready Pt not appropriate for PT services at this time secondary to high Fi02 requirements on vent and PEEP 8. Plan to trach/PEG on Monday. Will continue to follow for appropriateness.    Blake Divine A Cordel Drewes 10/05/2018, 7:34 AM [name], PT, DPT Acute Rehabilitation Services Pager [phone] Office [phone]

## 2018-10-05 NOTE — Progress Notes (Signed)
Occupational Therapy Treatment/Re-eval Patient Details Name: Johnathan Archer MRN: [id number] DOB: 1971/04/11 Today's Date: 10/05/2018    History of present illness 48 year old male with history of Afib, chronic systolic/dystolic HF, DMT2, HTN, MR, NICM, anemia, obesity, and OSA, found down by coworkers wedged between sink and bathroom stall at work.  Presented as code stroke with left sided paralysis and right gaze.  Hypertensive at 221/ 145.  Found on CT to have acute right MCA M1 occlusion. Pt Intubated for airway protection. Taken to IR with successful revascularization of right MCA M1 occlusion. Pt extubated 4/10. Reintubated 4/12-4/13.  On 4/14, pt with decreased responsiveness and ultimately code blue was activated.  Pt now intubated.    OT comments  Pt unresponsive this pm on vent.   PROM performed.  Goals and treatment plan updated.   Recommend LTACH.   Follow Up Recommendations  LTACH    Equipment Recommendations  None recommended by OT    Recommendations for Other Services      Precautions / Restrictions Precautions Precautions: Fall       Mobility Bed Mobility                  Transfers                      Balance                                           ADL either performed or assessed with clinical judgement   ADL                                         General ADL Comments: Pt requires total A for all aspectes      Vision       Perception     Praxis      Cognition Arousal/Alertness: Lethargic                                     General Comments: Unresponsive this date         Exercises Exercises: General Upper Extremity General Exercises - Upper Extremity Shoulder Flexion: PROM;Left;10 reps;Supine Shoulder ADduction: PROM;10 reps;Supine;Left Shoulder Horizontal ABduction: PROM;Left;10 reps;Supine Shoulder Horizontal ADduction: PROM;Left;10 reps;Supine Elbow  Flexion: PROM;Left;10 reps;Supine Elbow Extension: PROM;Left;10 reps;Supine Wrist Flexion: PROM;Left;10 reps;Supine Wrist Extension: PROM;Left;10 reps;Supine Digit Composite Flexion: PROM;Left;10 reps;Supine Composite Extension: PROM;Left;10 reps;Supine   Shoulder Instructions       General Comments HR in 110's.  on vent with 40% Fi02 and 5 PEEP     Pertinent Vitals/ Pain       Pain Assessment: Faces Faces Pain Scale: Hurts little more Pain Location: Rt hand with positioning and ROM  Pain Descriptors / Indicators: Grimacing Pain Intervention(s): Limited activity within patient's tolerance  Home Living                                          Prior Functioning/Environment              Frequency  Min 3X/week        Progress  Toward Goals  OT Goals(current goals can now be found in the care plan section)  Progress towards OT goals: Not progressing toward goals - comment;Goals drowngraded-see care plan  Acute Rehab OT Goals OT Goal Formulation: Patient unable to participate in goal setting Time For Goal Achievement: 10/19/18 Potential to Achieve Goals: Fair ADL Goals Pt Will Perform Grooming: with max assist;bed level;sitting Additional ADL Goal #1: Pt will sustain attention to simple grooming task x 30 seconds Additional ADL Goal #2: Pt will sit EOB with max A x 10 mins in prep for ADLs Additional ADL Goal #3: Pt will follow one step command 10% of time in prep for ADL  Plan Discharge plan needs to be updated;Other (comment)(goals downgraded )    Co-evaluation                 AM-PAC OT "6 Clicks" Daily Activity     Outcome Measure   Help from another person eating meals?: Total Help from another person taking care of personal grooming?: Total Help from another person toileting, which includes using toliet, bedpan, or urinal?: Total Help from another person bathing (including washing, rinsing, drying)?: Total Help from another person  to put on and taking off regular upper body clothing?: Total Help from another person to put on and taking off regular lower body clothing?: Total 6 Click Score: 6    End of Session    OT Visit Diagnosis: Hemiplegia and hemiparesis;Muscle weakness (generalized) (M62.81) Hemiplegia - Right/Left: Left Hemiplegia - dominant/non-dominant: Non-Dominant Hemiplegia - caused by: Cerebral infarction   Activity Tolerance     Patient Left     Nurse Communication          Time: 6433-2951 OT Time Calculation (min): 8 min  Charges: OT General Charges $OT Visit: 1 Visit OT Evaluation $OT Re-eval: 1 Re-eval  Jeani Hawking, OTR/L Acute Rehabilitation Services Pager [phone] Office [phone]    Jeani Hawking M 10/05/2018, 3:57 PM

## 2018-10-05 NOTE — Progress Notes (Signed)
SLP Cancellation Note  Patient Details Name: Johnathan Archer MRN: [id number] DOB: 1970-08-30   Cancelled treatment:        Mechanical ventilation. Noted plans for PEG/trach. Will follow.     Royce Macadamia 10/05/2018, 8:44 AM   Breck Coons Lonell Face.Ed Nurse, children's [phone] Office [phone]

## 2018-10-05 NOTE — Progress Notes (Addendum)
NAME:  Johnathan Archer, MRN:  [id number], DOB:  03-Oct-1970, LOS: 8 ADMISSION DATE:  09/24/2018, CONSULTATION DATE:  10/06/2018 REFERRING MD:  Dr. Loni Beckwith, CHIEF COMPLAINT:  CVA  Brief History   80 yoM, LSW 1000 am 4/9, presented with left sided paralysis and right gaze found to have acute right MCA M1 occlusion.  Difficult intubation for airway protection.  Taken for EVR with successful revascularization.    History of present illness   HPI obtained from medical chart review as patient is intubated/ sedated   48 year old male with history of Afib on xarelto, chronic systolic / dystolic HF, DMT2, HTN, MR, NICM, anemia, obesity, and OSA, last seen well around 1000 by coworkers found wedged between sink and bathroom stall at work.  Presented as code stroke with left sided paralysis and right gaze.  Hypertensive at 221/ 145.  Found on CT to have acute right MCA M1 occlusion.  Intubated 4/9  for airway protection, was a difficult intubation.  Taken to IR with successful revascularization of right MCA M1 occlusion achieving TICI 2b revascularization.  Returns to ICU, PCCM to consult for ventilator management.   Called back am 4/15 due to arrest in CT with ? Aspiration   Past Medical History  Afib on xarelto, chronic systolic / dystolic HF, DMT2, HTN, MR, NICM, anemia, obesity, OSA, ED   Significant Hospital Events   4/9 Admitted / EVR 4/10 Extubated, started on 3% saline 4/11 Remains on Cleviprex, Failed swallowing evaluation 4/10, left n.p.o. with gastric tube in place, fevers 4/12 Intubated early am for airway compromise/ repeat MRI, rising Na, 3% held, pan-cultured and started on abx  4/13  Re-extubated  4/15 reintubated p ? Asp event in Ct > for peg/trach  Consults:  Neurology   Procedures:  4/9 ETT >>4/10; 4/12 >> 4/13  4/9 R radial aline >> 4/9 cerebral angiogram  Re et  4/14  Significant Diagnostic Tests:  4/9 CTH >> 1. Hyperdense right MCA compatible with acute embolus.  Early infarct in the right basal ganglia and insula 2. 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 Poor flow in right MCA branches. Small amount of clot in right A1 segment. Findings compatible with acute embolus. No significant atherosclerotic disease in the neck or head.  4/9 CTH post intervention >> Hyperdensity within the right basal ganglia may indicate contrast staining or petechial hemorrhage. No significant mass effect.  4/10 MRI brain >> evolving right MCA M1 stroke, culprit vessel is patent.  Some petechial changes without overt hemorrhagic conversion, no shift  Echocardiogram 4/10 >> normal LV function 60-65%, mildly increased LV wall thickness without diastolic dysfunction, normal RV function, no evidence of shunt by bubble study,  4/12 MRI Brain >> continued interval evolution large right MCA CVA, scattered petechial hemorrhage slightly increased, increased gyral swelling with edema and regional mass-effect.  Right to left shift increased from 4 mm to 6 mm.  No hydrocephalus.  A few new subcentimeter acute left cerebral infarcts  Micro Data:  4/9 MRSA PCR >> neg  4/12 BC x 2 >> no growth 4/12 UC >> neg  4/12 trach asp >> mod gpc, few gnr >>> nl flora  4/15 Trach asp >>> mod wbc, few gpc >> 4/15 Resp panel by PCR >  Neg  4/15 Covid-19  Neg   Antimicrobials:  4/9 ancef preop  Unaysn 4/12-4/16  Interim history/subjective:  Follows commands.  Trauma discussed plan for trach next week. Currently has a phlebitis that needs treating T Max 100.1 + 5 L  Objective   Blood pressure (!) 199/91, pulse 73, temperature 100.1 F (37.8 C), temperature source Rectal, resp. rate 15, height 6' (1.829 m), weight 102 kg, SpO2 100 %.  RA    Vent Mode: PSV;CPAP FiO2 (%):  [40 %] 40 % Set Rate:  [18 bmp] 18 bmp Vt Set:  [620 mL] 620 mL PEEP:  [5 cmH20-8  cmH20] 5 cmH20 Pressure Support:  [10 cmH20-12 cmH20] 10 cmH20 Plateau Pressure:  [15 cmH20-19 cmH20] 19 cmH20   Intake/Output Summary (Last 24 hours) at 10/05/2018 1001 Last data filed at 10/05/2018 0800 Gross per 24 hour  Intake 2573.02 ml  Output 1625 ml  Net 948.02 ml   Filed Weights   09/30/18 0630 10/02/18 1954 10/03/18 0436  Weight: 97 kg 95.6 kg 102 kg   Physical Exam: General: Well-appearing, no acute distress, ETT is secure and intact HENT: [place], AT, ETT in place and secure Eyes: EOMI, no scleral icterus, does not open to command Respiratory: BIlateral chest excursion, Clear to auscultation bilaterally.  No crackles, wheezing or rales Cardiovascular: S1, S2, RRR, -M/R/G, no JVD GI: BS+, soft, nontender, ND, Obese Extremities:RUE swelling with mild tenderness, wrapped with Xeroform dressing applied, brisk cap refill,  radial pulse palpable Neuro: Currently resting after pain med, follows commands on right arm and leg, left hemiparesis Skin: Right hand blistering secondary to edema>> dressing dry and intact GU: Foley in place  Resolved Hospital Problem list    Assessment & Plan:   Right MCA M1 occlusion s/p EVR with successful revascularization Plan:  Management per stroke team and interventional radiology, suspect the degree of his neurological injury impacting airway protection> needs trach / peg  Hypertonic saline held since 4/12  Acute respiratory failure, impaired airway protection and aspiration pneumonia Difficult airway OSA Completed unasyn x 5d  Weaning on 10/5 + 5 L Plan: Full vent support Wean as able Trauma consulted for trach. Plan for early next week Culture as is clinically indicated CXR 4/18   Acute renal failure UOP adequate Creatinine improving slowly Plan: Trend BMP  Monitor UOP Hold nephrotoxic agents  Hypernatremia Likely iatrogenic, consider hypovolemia Plan: 1/2 NS D5 Free water Trend Na  RUE edema Phlebitis? Worsening edema  and skin sloughing Wound care has seen patient Plan: Consult Ortho/Hand Consult ID Keep elevated Warm compresses Vascular checks to hand  Afib Elevated INR>> resolved Plan:  Xarelto on hold, defer to neurology as to when this can be restarted in light of the petechial changes on his MRI brain 4/10 and 4/12 Amiodarone restarted 4/11>>now off INR daily   Chronic systolic/ diastolic HF 2/20 EF 55%, normal RV, mod MR, mild dilation of aortic root.  Echocardiogram 4/11 with improved LV function and no evidence of impaired LV relaxation as above P:  Restarted home carvedilol at lower dose 4/11 Hold entresto for AKI  Hypertension, UDS negative BP remains high 4/17 P:  SBP goals < 180  = adequately rx'd  Continue coreg 25 mg BID, ASA, isordil 10 mg TID Increase hydralazine 100 mg BID Prn labetalol/hydralazine  DMT2 P:  CBG Q 4 Sliding-scale insulin  Increase lantus 25U daily Home metformin and Tresiba insulin are on hold  Best practice:  Diet: NPO, TF Pain/Anxiety/Delirium protocol (if indicated): sedate prn fentanyl VAP protocol (if indicated): no longer indcated  DVT prophylaxis: SCDs/heparin sq  GI prophylaxis: PPI Glucose control: SSI resistant / lantus Mobility: BR Code Status: Full Family Communication: none at bedside Disposition: keep in NICU/ [facility]  Labs   CBC: Recent Labs  Lab 10/01/18 0712 10/02/18 0557 10/03/18 0028 10/03/18 0630 10/03/18 1153 10/03/18 2014 10/04/18 1104 10/05/18 0626  WBC 7.6 6.7  --   --  8.6  --  6.9 8.6  HGB 10.6* 10.2* 10.2*  --  10.6* 9.2* 8.7* 9.0*  HCT 36.0* 34.6* 30.0* 31.5* 34.1* 27.0* 30.5* 31.8*  MCV 106.5* 105.5*  --   --  100.0  --  105.9* 106.7*  PLT 139* 151  --   --  PLATELET CLUMPS NOTED ON SMEAR, UNABLE TO ESTIMATE  --  129* 153    Basic Metabolic Panel: Recent Labs  Lab 10/01/18 0712  10/02/18 0557 10/03/18 0028 10/03/18 0630  10/03/18 2014 10/03/18 2312 10/04/18 0516 10/04/18  1104 10/04/18 1741 10/05/18 0626  NA 165*   < > 160* 158* 159*   < > 160* 159* 158* 157* 156* 155*  K 3.6  --  3.8 4.0 4.2  --  3.6  --   --  3.8  --  4.0  CL 129*  --  124*  --  121*  --   --   --   --  123*  --  120*  CO2 28  --  27  --  25  --   --   --   --  28  --  25  GLUCOSE 74  --  204*  --  251*  --   --   --   --  230*  --  172*  BUN 30*  --  32*  --  36*  --   --   --   --  36*  --  28*  CREATININE 1.67*  --  1.75*  --  2.22*  --   --   --   --  1.86*  --  1.62*  CALCIUM 8.8*  --  8.6*  --  8.4*  --   --   --   --  7.9*  --  8.3*  MG  --   --  2.4  --   --   --   --   --   --   --   --   --   PHOS  --   --  3.2  --  5.1*  --   --   --   --   --   --   --    < > = values in this interval not displayed.   GFR: Estimated Creatinine Clearance: 68.9 mL/min (A) (by C-G formula based on SCr of 1.62 mg/dL (H)). Recent Labs  Lab 10/02/18 0557 10/03/18 1153 10/04/18 1104 10/05/18 0626  WBC 6.7 8.6 6.9 8.6    Liver Function Tests: No results for input(s): AST, ALT, ALKPHOS, BILITOT, PROT, ALBUMIN in the last 168 hours. No results for input(s): LIPASE, AMYLASE in the last 168 hours. No results for input(s): AMMONIA in the last 168 hours.  ABG    Component Value Date/Time   PHART 7.441 10/03/2018 2014   PCO2ART 41.6 10/03/2018 2014   PO2ART 164.0 (H) 10/03/2018 2014   HCO3 28.2 (H) 10/03/2018 2014   TCO2 29 10/03/2018 2014   ACIDBASEDEF 2.0 09/30/2018 0624   O2SAT 100.0 10/03/2018 2014     Coagulation Profile: Recent Labs  Lab 10/04/18 1104 10/05/18 [id number]  INR 1.2 1.2    Cardiac Enzymes: No results for input(s): CKTOTAL, CKMB, CKMBINDEX, TROPONINI in the last 168 hours.  HbA1C: Hgb A1c MFr Bld  Date/Time Value Ref Range Status  09/28/2018 02:24 AM 8.9 (H) 4.8 - 5.6 % Final    Comment:    (NOTE) Pre diabetes:          5.7%-6.4% Diabetes:              >6.4% Glycemic control for   <7.0% adults with diabetes   03/11/2017 07:41 PM 8.0 (H) 4.8 - 5.6 % Final     Comment:    (NOTE) Pre diabetes:          5.7%-6.4% Diabetes:              >6.4% Glycemic control for   <7.0% adults with diabetes     CBG: Recent Labs  Lab 10/04/18 1542 10/04/18 1928 10/04/18 2320 10/05/18 0338 10/05/18 0747  GLUCAP 161* 193* 194* 147* 134*    The patient is critically ill with multiple organ systems failure and requires high complexity decision making for assessment and support, frequent evaluation and titration of therapies, application of advanced monitoring technologies and extensive interpretation of multiple databases.    Bevelyn Ngo, AGACNP-BC [facility] Pager: [phone] After hours pager: [phone] 10/05/2018 10:02 AM  ADDENDUM: I have independently seen and examined the patient, reviewed data, and developed an assessment and plan with the APP. A total of 36 minutes were spent in critical care assessment and medical decision making. This critical care time does not reflect procedure time, or teaching time or supervisory time of PA/NP/Med student/Med Resident, etc but could involve care discussion time. Agree with the documentation above.   Gwynne Edinger, MD PhD

## 2018-10-05 NOTE — Progress Notes (Addendum)
F/U eval for R hand swelling.   Presently O2 sats 100%, probe on R IF. Hand reasonably warm. Dorsal and bullae, some ruptured, with reddish injected dermis exposed.  Hand and wrist elevated, but wrist/hand/digits resting in neutral alignment, not flexed Allows passive composite HE FA with SQ edema, but not tense  Significant SQ edema of hand remains.  Skin injury at this time appears recoverable without need for excision and grafting.  No indication for fasciotomy.  Will allow all present conservative measures to continue, and check progress in several days.    Neil Crouch, MD [facility] [phone]

## 2018-10-05 NOTE — Plan of Care (Signed)
Goal No longer appropriate

## 2018-10-06 ENCOUNTER — Inpatient Hospital Stay (HOSPITAL_COMMUNITY): Payer: BLUE CROSS/BLUE SHIELD

## 2018-10-06 DIAGNOSIS — G9340 Encephalopathy, unspecified: Secondary | ICD-10-CM

## 2018-10-06 LAB — GLUCOSE, CAPILLARY
Glucose-Capillary: 131 mg/dL — ABNORMAL HIGH (ref 70–99)
Glucose-Capillary: 109 mg/dL — ABNORMAL HIGH (ref 70–99)
Glucose-Capillary: 110 mg/dL — ABNORMAL HIGH (ref 70–99)
Glucose-Capillary: 151 mg/dL — ABNORMAL HIGH (ref 70–99)
Glucose-Capillary: 129 mg/dL — ABNORMAL HIGH (ref 70–99)
Glucose-Capillary: 141 mg/dL — ABNORMAL HIGH (ref 70–99)

## 2018-10-06 LAB — CBC
HCT: 27 % — ABNORMAL LOW (ref 39.0–52.0)
Hemoglobin: 7.9 g/dL — ABNORMAL LOW (ref 13.0–17.0)
MCH: 31.5 pg (ref 26.0–34.0)
MCHC: 29.3 g/dL — ABNORMAL LOW (ref 30.0–36.0)
MCV: 107.6 fL — ABNORMAL HIGH (ref 80.0–100.0)
Platelets: 143 10*3/uL — ABNORMAL LOW (ref 150–400)
RBC: 2.51 MIL/uL — ABNORMAL LOW (ref 4.22–5.81)
RDW: 14 % (ref 11.5–15.5)
WBC: 8.4 10*3/uL (ref 4.0–10.5)
nRBC: 0 % (ref 0.0–0.2)

## 2018-10-06 LAB — SODIUM
Sodium: 149 mmol/L — ABNORMAL HIGH (ref 135–145)
Sodium: 150 mmol/L — ABNORMAL HIGH (ref 135–145)
Sodium: 153 mmol/L — ABNORMAL HIGH (ref 135–145)

## 2018-10-06 LAB — CBC WITH DIFFERENTIAL/PLATELET
Abs Immature Granulocytes: 0.05 10*3/uL (ref 0.00–0.07)
Basophils Absolute: 0 10*3/uL (ref 0.0–0.1)
Basophils Relative: 0 %
Eosinophils Absolute: 0.1 10*3/uL (ref 0.0–0.5)
Eosinophils Relative: 1 %
HCT: 25.2 % — ABNORMAL LOW (ref 39.0–52.0)
Hemoglobin: 7.2 g/dL — ABNORMAL LOW (ref 13.0–17.0)
Immature Granulocytes: 1 %
Lymphocytes Relative: 14 %
Lymphs Abs: 1.3 10*3/uL (ref 0.7–4.0)
MCH: 30.5 pg (ref 26.0–34.0)
MCHC: 28.6 g/dL — ABNORMAL LOW (ref 30.0–36.0)
MCV: 106.8 fL — ABNORMAL HIGH (ref 80.0–100.0)
Monocytes Absolute: 0.9 10*3/uL (ref 0.1–1.0)
Monocytes Relative: 10 %
Neutro Abs: 7 10*3/uL (ref 1.7–7.7)
Neutrophils Relative %: 74 %
Platelets: 187 10*3/uL (ref 150–400)
RBC: 2.36 MIL/uL — ABNORMAL LOW (ref 4.22–5.81)
RDW: 14.1 % (ref 11.5–15.5)
WBC: 9.3 10*3/uL (ref 4.0–10.5)
nRBC: 0 % (ref 0.0–0.2)

## 2018-10-06 LAB — BASIC METABOLIC PANEL
Anion gap: 12 (ref 5–15)
BUN: 36 mg/dL — ABNORMAL HIGH (ref 6–20)
CO2: 23 mmol/L (ref 22–32)
Calcium: 8.3 mg/dL — ABNORMAL LOW (ref 8.9–10.3)
Chloride: 117 mmol/L — ABNORMAL HIGH (ref 98–111)
Creatinine, Ser: 1.79 mg/dL — ABNORMAL HIGH (ref 0.61–1.24)
GFR calc Af Amer: 51 mL/min — ABNORMAL LOW (ref >60)
GFR calc non Af Amer: 44 mL/min — ABNORMAL LOW (ref >60)
Glucose, Bld: 145 mg/dL — ABNORMAL HIGH (ref 70–99)
Potassium: 4.4 mmol/L (ref 3.5–5.1)
Sodium: 152 mmol/L — ABNORMAL HIGH (ref 135–145)

## 2018-10-06 LAB — PROTIME-INR
INR: 1.2 (ref 0.8–1.2)
Prothrombin Time: 14.8 seconds (ref 11.4–15.2)

## 2018-10-06 LAB — MAGNESIUM: Magnesium: 3 mg/dL — ABNORMAL HIGH (ref 1.7–2.4)

## 2018-10-06 MED ORDER — CEFAZOLIN SODIUM-DEXTROSE 1-4 GM/50ML-% IV SOLN
1.0000 g | Freq: Three times a day (TID) | INTRAVENOUS | Status: AC
  Administered 2018-10-06 – 2018-10-07 (×3): 1 g via INTRAVENOUS
  Filled 2018-10-06 (×3): qty 50

## 2018-10-06 NOTE — Progress Notes (Signed)
Video call attempted with uncle x 2 with no response. Called Uncle personally and explained procedure. Attempt again, no response. Called wife (that is scheduled for 9PM call) without answer. Notified bedside RN and will attempt again at 9PM

## 2018-10-06 NOTE — Progress Notes (Signed)
Patient with neuro change at approximately 0500. Patient spiked multiple temperatures last night with Tmax of 101.5. He also had several episodes of hypertension that were treated successfully with prn medication. Systolic blood pressure range of 98-210 throughout the night. Patient was following commands, opening eyes, and nodding head "yes" and "no" throughout the night; however, patient did seem to have increased anxiety and agitation as evidenced by shaking/kicking right leg and vent desynchrony. Patient also had multiple episodes of shivering when his temperature elevated. These symptoms were treated with prn meds to maintain RASS goal. Called MD when patient would not follow commands consistently or open eyes as he had been doing. Informed Dr. Otelia Limes of these findings. Stat head CT was ordered. Dr. Otelia Limes stated would update wife via telephone of CT findings.

## 2018-10-06 NOTE — Progress Notes (Signed)
STROKE TEAM PROGRESS NOTE   INTERVAL HISTORY Patient change earlier this morning after despite multiple pressors last night T-max one 1.5 and several episodes of hypotension requiring treatment with as needed medication.  Less responsive and not following commands.  Stat CT scan of the head was obtained which I have reviewed evolving hypodensities in the right frontal and parietal lobes which appear increased compared to previous scans with stable 10 mm right left shift.  Patient's right upper extremity swelling and pain is slightly improved.  White count is not elevated Vitals:   10/06/18 0800 10/06/18 0822 10/06/18 0830 10/06/18 0901  BP: (!) 200/86 (!) 158/70 (!) 147/67   Pulse: 83 80 81   Resp: Temp: 100.1 F (37.8 C)     TempSrc: Rectal     SpO2: 100% 100% 100% 100%  Weight:      Height:        CBC:  Recent Labs  Lab 10/05/18 0626 10/06/18 0649  WBC 8.6 8.4  HGB 9.0* 7.9*  HCT 31.8* 27.0*  MCV 106.7* 107.6*  PLT 153 143*    Basic Metabolic Panel:  Recent Labs  Lab 10/02/18 0557  10/03/18 0630  10/05/18 0626  10/05/18 2355 10/06/18 0649  NA 160*   < > 159*   < > 155*   < > 153* 152*  K 3.8   < > 4.2   < > 4.0  --   --  4.4  CL 124*  --  121*   < > 120*  --   --  117*  CO2 27  --  25   < > 25  --   --  23  GLUCOSE 204*  --  251*   < > 172*  --   --  145*  BUN 32*  --  36*   < > 28*  --   --  36*  CREATININE 1.75*  --  2.22*   < > 1.62*  --   --  1.79*  CALCIUM 8.6*  --  8.4*   < > 8.3*  --   --  8.3*  MG 2.4  --   --   --   --   --   --  3.0*  PHOS 3.2  --  5.1*  --   --   --   --   --    < > = values in this interval not displayed.    IMAGING past 24h Ct Head Wo Contrast  Result Date: 10/06/2018 CLINICAL DATA:  48 y/o  M; stroke for follow-up. EXAM: CT HEAD WITHOUT CONTRAST TECHNIQUE: Contiguous axial images were obtained from the base of the skull through the vertex without intravenous contrast. COMPARISON:  10/02/2018 CT head and CTA head.  09/30/2018 MRI of the head. FINDINGS: Brain: Large multifocal infarction in the right MCA distribution appears increased in distribution when compared with region of diffusion signal abnormality on the prior MRI of the head. There is greater and more confluent extension of infarct inferiorly into the parietal lobe and anteriorly into the frontal lobe. Faint petechial hemorrhage within the right basal ganglia is stable, no gross hemorrhage. Mass effect is stable with 10 mm of right-to-left midline shift and effacement of the right lateral ventricle. No extra-axial collection, hydrocephalus, or herniation. Vascular: No hyperdense vessel or unexpected calcification. Skull: Normal. Negative for fracture or focal lesion. Sinuses/Orbits: Moderate paranasal sinus mucosal thickening, probably due to nasogastric tube. Other: None. IMPRESSION: 1.  Large right MCA distribution infarction appears increased in distribution when compared with the prior MRI of the head diffusion signal abnormality with greater and more confluent extension of infarct inferiorly into the right parietal lobe and anteriorly into the frontal lobe. 2. Stable mass effect with 10 mm right-to-left midline shift and effacement of right lateral ventricle. 3. Faint petechial hemorrhage within the right basal ganglia is stable, no gross hemorrhage. These results were called by telephone at the time of interpretation on 10/06/2018 at 6:02 am to Dr. Caryl Pina , who verbally acknowledged these results. Electronically Signed   By: Mitzi Hansen M.D.   On: 10/06/2018 06:05   Dg Chest Port 1 View  Result Date: 10/06/2018 CLINICAL DATA:  Respiratory failure. EXAM: PORTABLE CHEST 1 VIEW COMPARISON:  10/02/2018 FINDINGS: The patient is rotated to the right. Endotracheal tube terminates 6 cm above the carina. Feeding tube courses into the abdomen with tip not imaged. The cardiac silhouette remains enlarged. Asymmetric interstitial type densities in the  right lung have improved. No sizable pleural effusion or pneumothorax is identified. IMPRESSION: Improved lung aeration suggesting improved edema. Electronically Signed   By: Sebastian Ache M.D.   On: 10/06/2018 08:36     PHYSICAL EXAM  : General -obese middle-aged African-American male who is intubated but not on sedation. Right upper extremity bullous lesions with hand swelling and bandage.   Cardiovascular - Regular rate and rhythm, not in afib.  Neurological Exam :  -   intubated.  Not sedated.  Drowsy but can be  aroused.  Gait difficulty only.  Not able to followi  simple commands on the right. , eyes deviated to the right with left gaze palsy., pupil bilateral 2.5 mm, sluggish reaction to light. No blinking to visual threat bilaterally, right gaze with sustained nystagmus, left gaze palsy. Corneal reflex weak on the left but present on the right, gag and cough present.  Bilateral facial weakness. Tongue midline in mouth. Spontaneous moving RUE and RLE well against gravity, RUE 3-/5, RLE 2/5. LUE 0/5 and LLE slight withdraw on pain stimulation. DTR 1+ and left babinski. Sensation and coordination not cooperative and gait not tested.   ASSESSMENT/PLAN [name] is a 48 y.o. male with history of HTN, DB, CHF and AF on Xarelto found down at work, presenting with L hemiparesis and R gaze.   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 Early infarct R BG and insula.    CTA head & neck ELO R ICA, R M1, R M2. R A1 clot.   Cerebral angio R M1 TICI2b reperfusion  CT head hyperdensity R BG, contrast vs hmg. No mass effect  MRI  Large R MCA infarct, associated edema and mass effect w/o shift. prob small R cerebellar infarct. Small vessel disease.   MRA  Normal, previous R M1 open  2D Echo EF 60-65%. No source of embolus  Neurologic worsening 10/02/18,  decreased LOC and sonorous breathing w/ aphasia likely due to inability to protect airway and slight increase in cytotoxic edema.  Further neurological worsening 10/06/2018 following temperature spike and blood pressure elevation.  Follow-up CT shows increased right frontal and parietal densities likely extension recent infarct  CT head - R MCA R BG, perisylvian frontal and temporal lobes stable. Stable petechial hemorrhage. No new stroke. Increased edema and mass effect 10 mm  R to L shift and effacement R lat ventricle (was 6 mm)  CTA head - no LVO, patent circ  CTA neck - no LVO or sign stenosis. Increased ground-glass opacification upper R lung  LDL 73  HgbA1c 8.9  Heparin 5000 units sq tid or VTE prophylaxis Xarelto (rivaroxaban) daily prior to admission, now on No antithrombotic.  On ASA 325, consider resume DOAC once able to swallow or permanent tube placed  Therapy recommendations: CIR  Disposition:  pending   Cerebral edema  Induced hypernatremia  09/28/2018 pt with right ptosis, right pupil dilation with reserved reflex, right gaze sustained nystagmus -> 09/29/2018 bilateral proptosis, minimal extraocular movement bilaterally, pupil equal sluggish to light, bilateral facial weakness, dysphagia  MRI right temporal pole edema with cerebral peduncle compression  CT repeat 09/29/2018- Increasing regional mass effect and edema with associated 4 mm right-to-left shift.   MRI repeat increased MLS to 79mm, brainstem compression stable  Treated with 3% protocol, drip off as at goal  Na 155   free water and 1/2 NS for gradual lowering Na  Continue to monitor  Cardiac Arrest 4/14  While in CT 4/14 for decreased LOC and respiratory compromise  epinephine & compressions  stabilized  Acute on Chronic Respiratory Failure w/ hypoxia and hypercapnia  OSA  Initial intubation in ED for IR  Extubated 4/10  reintubated 4/11 d/t increased WOB  Extubated 4/14  reintubated 4/15  d/t diminished cough and gag, snoring  trach planned for Mon w/ Trauma  CXR 10/06/18 am stable atelectasis no infiltrates  CCM onboard  Atrial Fibrillation w/ RVR & VT  Home anticoagulation:  Xarelto (rivaroxaban) daily  . Home meds: amiodarone 200 . On ASA 325  Consider resume DOAC once able to swallow or permanent tube placed   Chronic combined systolic and diastolic CHF   Home meds: Coreg 37.5 bid, lasix 60 bid, hydralazine 50 bid, isosorbide 30, entresto bid, spironolactone 25  CXR 10/08/2018 lungs clear  resumed home meds - Coreg 25 bid, lasix 60 bid, hydralazine 50 bid, isosorbide 30, entresto bid, spironolactone 25  Fever   Tmax 100.1  Blood culture no growth    urine culture no growth   resp culture normal respiratory flora  Continue hydration  CXR  Improving airspace R lung base. Resolved right pleural effusion  Unasyn 4/12>>4/16   CCM on board  Dysphagia  Secondary to stroke  On tube feeds via Cortrak  SLP following   PEG placement planned for Mon w/ Trauma  Urinary Retention  Foley inserted 4/14 after multiple I&O  Hypertension  lower  Home meds:  Coreg 37.5 bid, lasix 60 bid, hydralazine 50 bid, isosorbide 30, entresto bid, spironolactone 25  BP goal < 180  Off cleviprex  On hydralazine 100 bid, isosorbide 10 tid . Long-term BP goal normotensive  Hyperlipidemia  Home meds:  No statin  LDL 73, goal < 70  Add lipitor 20  Continue statin at discharge  Diabetes type II uncontrolled  Home meds:  Metformin 500 bid, insulin degludec 8u  HgbA1c 8.9, goal < 7.0  Hyperglycemia  CCM on board  Lantus 25u q hs  SSI novolog 0-15 q4h  Change Novolog 6u to q4h for TF coverage   Hold off metformin due to elevated Cre  CBGs q4  Other Stroke Risk Factors  Obesity, Body mass index is 30.5 kg/m., recommend weight loss, diet and exercise as appropriate   Obstructive sleep apnea  Nonischemic CM  Other Active Problems  AKI  on  CKD stage II Cr 1.67->1.75->2.22->1.62  Normocytic Anemia 12.5->10.2->9.0, MCV 105.5 - VB 12 normal and folate normal  Thrombocytopenia 241->151->153  Dorsal RUE hand and forearm w/ bulbous lesion - phlebitis/cellulitis - likely d/t med infiltration during code blue - tender - WOC RN recommended xeroform to area and change qod - Hand surgeon Janee Morn) does not feel c/w compartment syndrome, recommended elevation and warmth. No surgical intervention. If necrosis develops, please reconsult. - new blisters developed post consult - warm compresses and vascular checks to hand  Hospital day # 9  Continue conservative management for right upper extremity cellulitis as per wound nurse and surgeon.  Patient has had neurological decline in the setting of elevated temperature and blood pressure.  Recommend aggressive temperature control and consider antibiotics after discussion with critical care MD. likely trach and PEG if patient is stable next week.  Discussed with patient and critical care team MD Dr Ollen Bowl.I also spoke to the patient's wife over the phone and gave an update about patient's neurological decline and CT scan findings from this morning    This patient is critically ill and at significant risk of neurological worsening, death and care requires constant monitoring of vital signs, hemodynamics,respiratory and cardiac monitoring, extensive review of multiple databases, frequent neurological assessment, discussion with family, other specialists and medical decision making of high complexity.I have made any additions or clarifications directly to the above note.This critical care time does not reflect procedure time, or teaching time or supervisory time of PA/NP/Med Resident etc but could involve care discussion time.  I spent 35 minutes of neurocritical care time  in the care of  this patient.      Delia Heady, MD Medical Director [facility] Pager: [phone] 10/06/2018  11:30 AM   To contact Stroke Continuity provider, please refer to [email or web address]. After hours, contact General Neurology

## 2018-10-06 NOTE — Progress Notes (Signed)
EEG completed, results pending. 

## 2018-10-06 NOTE — Progress Notes (Signed)
Assisted tele visit to patient with family.  Victorino Fatzinger P, RN  

## 2018-10-06 NOTE — Progress Notes (Signed)
NAME:  Johnathan Archer, MRN:  [id number], DOB:  Mar 17, 1971, LOS: 9 ADMISSION DATE:  10/07/2018, CONSULTATION DATE:  10/16/2018 REFERRING MD:  Dr. Loni Beckwitheveswhar, CHIEF COMPLAINT:  CVA  Brief History   [age] yoM, LSW 1000 am 4/9, presented with left sided paralysis and right gaze found to have acute right MCA M1 occlusion.  Difficult intubation for airway protection.  Taken for EVR with successful revascularization.    History of present illness   HPI obtained from medical chart review as patient is intubated/ sedated   48 year old male with history of Afib on xarelto, chronic systolic / dystolic HF, DMT2, HTN, MR, NICM, anemia, obesity, and OSA, last seen well around 1000 by coworkers found wedged between sink and bathroom stall at work.  Presented as code stroke with left sided paralysis and right gaze.  Hypertensive at 221/ 145.  Found on CT to have acute right MCA M1 occlusion.  Intubated 4/9  for airway protection, was a difficult intubation.  Taken to IR with successful revascularization of right MCA M1 occlusion achieving TICI 2b revascularization.  Returns to ICU, PCCM to consult for ventilator management.   Called back am 4/15 due to arrest in CT with ? Aspiration   Past Medical History  Afib on xarelto, chronic systolic / dystolic HF, DMT2, HTN, MR, NICM, anemia, obesity, OSA, ED   Significant Hospital Events   4/9 Admitted / EVR 4/10 Extubated, started on 3% saline 4/11 Remains on Cleviprex, Failed swallowing evaluation 4/10, left n.p.o. with gastric tube in place, fevers 4/12 Intubated early am for airway compromise/ repeat MRI, rising Na, 3% held, pan-cultured and started on abx  4/13  Re-extubated  4/15 reintubated p ? Asp event in Ct > for peg/trach  Consults:  Neurology   Procedures:  4/9 ETT >>4/10; 4/12 >> 4/13  4/9 R radial aline >> 4/9 cerebral angiogram  Re et  4/14  Significant Diagnostic Tests:  4/9 CTH >> 1. Hyperdense right MCA compatible with acute embolus.  Early infarct in the right basal ganglia and insula 2. 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 Poor flow in right MCA branches. Small amount of clot in right A1 segment. Findings compatible with acute embolus. No significant atherosclerotic disease in the neck or head.  4/9 CTH post intervention >> Hyperdensity within the right basal ganglia may indicate contrast staining or petechial hemorrhage. No significant mass effect.  4/10 MRI brain >> evolving right MCA M1 stroke, culprit vessel is patent.  Some petechial changes without overt hemorrhagic conversion, no shift  Echocardiogram 4/10 >> normal LV function 60-65%, mildly increased LV wall thickness without diastolic dysfunction, normal RV function, no evidence of shunt by bubble study,  4/12 MRI Brain >> continued interval evolution large right MCA CVA, scattered petechial hemorrhage slightly increased, increased gyral swelling with edema and regional mass-effect.  Right to left shift increased from 4 mm to 6 mm.  No hydrocephalus.  A few new subcentimeter acute left cerebral infarcts  Micro Data:  4/9 MRSA PCR >> neg  4/12 BC x 2 >> no growth 4/12 UC >> neg  4/12 trach asp >> mod gpc, few gnr >>> nl flora  4/15 Trach asp >>> mod wbc, few gpc >> 4/15 Resp panel by PCR >  Neg  4/15 Covid-19  Neg   Antimicrobials:  4/9 ancef preop  Unaysn 4/12-4/16  Interim history/subjective:  Follows commands.  Trauma discussed plan for trach next week. Currently has a phlebitis that needs treating T Max 100.1 + 5 L  Objective   Blood pressure (!) 147/67, pulse 81, temperature 100.1 F (37.8 C), temperature source Rectal, resp. rate 18, height 6' (1.829 m), weight 102 kg, SpO2 100 %.  RA    Vent Mode: CPAP;PSV FiO2 (%):  [40 %] 40 % Set Rate:  [18 bmp] 18 bmp Vt Set:  [620 mL] 620 mL PEEP:  [5 cmH20] 5  cmH20 Pressure Support:  [10 cmH20] 10 cmH20 Plateau Pressure:  [18 cmH20] 18 cmH20   Intake/Output Summary (Last 24 hours) at 10/06/2018 1034 Last data filed at 10/06/2018 0900 Gross per 24 hour  Intake 1465.41 ml  Output 1600 ml  Net -134.59 ml   Filed Weights   09/30/18 0630 10/02/18 1954 10/03/18 0436  Weight: 97 kg 95.6 kg 102 kg   Physical Exam: General: Well-appearing, no acute distress, ETT is secure and intact HENT: [place], AT, ETT in place and secure Eyes: EOMI, no scleral icterus, does not open to command. Pupils equal, reactive Respiratory: BIlateral chest excursion, Clear to auscultation bilaterally.  No crackles, wheezing or rales Cardiovascular: RRR, -M/R/G,  GI: BS+, soft, nontender, ND, Obese Extremities:RUE edema to elbow with mild tenderness, wrapped with Xeroform dressing applied, brisk cap refill,   Neuro: Currently resting after pain med, follows commands on right arm and leg, left hemiparesis; toes upgoing Skin: Right hand blistering secondary to edema>> dressing dry and intact GU: Foley in place  Resolved Hospital Problem list    Assessment & Plan:   Right MCA M1 occlusion s/p EVR with successful revascularization Plan:  Management per stroke team and interventional radiology, suspect the degree of his neurological injury impacting airway protection> needs trach / peg  CT of early this morning noted; follow volume status,   Acute respiratory failure, impaired airway protection and aspiration pneumonia Difficult airway OSA Completed unasyn x 5d  Weaning on 10/5 + 5 L Plan: Full vent support Wean as able Trauma consulted for trach. Plan for early next week Culture as is clinically indicated CXR 4/18   Acute renal failure UOP adequate Creatinine improving slowly Plan: Trend BMP  Monitor UOP Hold nephrotoxic agents  Hypernatremia Likely iatrogenic, consider hypovolemia Plan: 1/2 NS D5 Free water Trend Na  RUE edema Phlebitis? edema and  skin sloughing Wound care has seen patient Plan: Consult Ortho/Hand; note from yesterday--> continue conservative care Keep elevated Warm compresses Vascular checks to hand  Afib Elevated INR>> resolved Plan:  Xarelto on hold, defer to neurology as to when this can be restarted in light of the petechial changes on his MRI brain 4/10 and 4/12 Amiodarone restarted 4/11>>now off INR daily   Chronic systolic/ diastolic HF 2/20 EF 55%, normal RV, mod MR, mild dilation of aortic root.  Echocardiogram 4/11 with improved LV function and no evidence of impaired LV relaxation as above P:  Restarted home carvedilol at lower dose 4/11 Hold entresto for AKI  Hypertension, UDS negative BP remains high 4/17 P:  SBP goals < 180  = adequately rx'd  Continue coreg 25 mg BID, ASA, isordil 10 mg TID Increase hydralazine 100 mg BID Prn labetalol/hydralazine  DMT2 P:  CBG Q 4 Sliding-scale insulin  Increase lantus 25U daily Home metformin and Tresiba insulin are on hold  Best practice:  Diet: NPO, TF Pain/Anxiety/Delirium protocol (if indicated): sedate prn fentanyl VAP protocol (if indicated): no  longer indcated  DVT prophylaxis: SCDs/heparin sq  GI prophylaxis: PPI Glucose control: SSI resistant / lantus Mobility: BR Code Status: Full Family Communication: none at bedside Disposition: keep in NICU/ needs trach/peg per [facility]   CBC: Recent Labs  Lab 10/02/18 0557  10/03/18 1153 10/03/18 2014 10/04/18 1104 10/05/18 0626 10/06/18 0649  WBC 6.7  --  8.6  --  6.9 8.6 8.4  HGB 10.2*   < > 10.6* 9.2* 8.7* 9.0* 7.9*  HCT 34.6*   < > 34.1* 27.0* 30.5* 31.8* 27.0*  MCV 105.5*  --  100.0  --  105.9* 106.7* 107.6*  PLT 151  --  PLATELET CLUMPS NOTED ON SMEAR, UNABLE TO ESTIMATE  --  129* 153 143*   < > = values in this interval not displayed.    Basic Metabolic Panel: Recent Labs  Lab 10/02/18 0557  10/03/18 0630  10/03/18 2014  10/04/18 1104  10/05/18 0626 10/05/18  1147 10/05/18 1807 10/05/18 2355 10/06/18 0649  NA 160*   < > 159*   < > 160*   < > 157*   < > 155* 156* 149* 153* 152*  K 3.8   < > 4.2  --  3.6  --  3.8  --  4.0  --   --   --  4.4  CL 124*  --  121*  --   --   --  123*  --  120*  --   --   --  117*  CO2 27  --  25  --   --   --  28  --  25  --   --   --  23  GLUCOSE 204*  --  251*  --   --   --  230*  --  172*  --   --   --  145*  BUN 32*  --  36*  --   --   --  36*  --  28*  --   --   --  36*  CREATININE 1.75*  --  2.22*  --   --   --  1.86*  --  1.62*  --   --   --  1.79*  CALCIUM 8.6*  --  8.4*  --   --   --  7.9*  --  8.3*  --   --   --  8.3*  MG 2.4  --   --   --   --   --   --   --   --   --   --   --  3.0*  PHOS 3.2  --  5.1*  --   --   --   --   --   --   --   --   --   --    < > = values in this interval not displayed.   GFR: Estimated Creatinine Clearance: 62.4 mL/min (A) (by C-G formula based on SCr of 1.79 mg/dL (H)). Recent Labs  Lab 10/03/18 1153 10/04/18 1104 10/05/18 0626 10/06/18 0649  WBC 8.6 6.9 8.6 8.4    Liver Function Tests: No results for input(s): AST, ALT, ALKPHOS, BILITOT, PROT, ALBUMIN in the last 168 hours. No results for input(s): LIPASE, AMYLASE in the last 168 hours. No results for input(s): AMMONIA in the last 168 hours.  ABG    Component Value Date/Time   PHART 7.441 10/03/2018 2014   PCO2ART 41.6 10/03/2018 2014  PO2ART 164.0 (H) 10/03/2018 2014   HCO3 28.2 (H) 10/03/2018 2014   TCO2 29 10/03/2018 2014   ACIDBASEDEF 2.0 09/30/2018 0624   O2SAT 100.0 10/03/2018 2014     Coagulation Profile: Recent Labs  Lab 10/04/18 1104 10/05/18 0626 10/06/18 0649  INR 1.2 1.2 1.2    Cardiac Enzymes: No results for input(s): CKTOTAL, CKMB, CKMBINDEX, TROPONINI in the last 168 hours.  HbA1C: Hgb A1c MFr Bld  Date/Time Value Ref Range Status  09/28/2018 02:24 AM 8.9 (H) 4.8 - 5.6 % Final    Comment:    (NOTE) Pre diabetes:          5.7%-6.4% Diabetes:              >6.4% Glycemic  control for   <7.0% adults with diabetes   03/11/2017 07:41 PM 8.0 (H) 4.8 - 5.6 % Final    Comment:    (NOTE) Pre diabetes:          5.7%-6.4% Diabetes:              >6.4% Glycemic control for   <7.0% adults with diabetes     CBG: Recent Labs  Lab 10/05/18 1527 10/05/18 1927 10/05/18 2336 10/06/18 0315 10/06/18 0759  GLUCAP 145* 157* 206* 131* 109*    The patient is critically ill with multiple organ systems failure and requires high complexity decision making for assessment and support, frequent evaluation and titration of therapies, application of advanced monitoring technologies and extensive interpretation of multiple databases.   I have independently seen and examined the patient, reviewed data, and developed an assessment and plan. A total of 32 minutes were spent in critical care assessment and medical decision making. This critical care time does not reflect procedure time, or teaching time or supervisory time of PA/NP/Med student/Med Resident, etc but could involve care discussion time. Agree with the documentation above.   Gwynne Edinger, MD PhD  10/06/2018 10:34 AM

## 2018-10-06 NOTE — Progress Notes (Signed)
S/O: Repeat CT head obtained to evaluate trend towards decreased movement of the patient's left side. There are new foci of subacute infarction in the of right frontal lobe and parietal lobe convexities. About 10-20 cc total of new infarction seen. No change to midline shift. No hemorrhage.   Assessment:  -- Based on my review of the images, most likely the infarct extension is due to failure of collateral flow in the context of fluctuating SBPs.  -- Regarding BP and volume status, the patient is getting free water flushes for volume maintenance at 300 mL q4h (1800 mL per day). May have increased insensible loss of intravascular volume due to sweating with some fevers, through respiratory losses. UOP is 500 cc this shift. He is developing diffuse peripheral edema as commonly is seen with stroke ICU patients who are on the vent and immobile.   Plan: -- UOP needs to be more closely monitored to help with accurately assessing volume status as changes in intravascular volume may be contributing to labile BP. Will re-place foley.  -- Due to his condition, we cannot yet place OOB to chair to assist with physiologically mediated diuresis.  -- Based on CT appearance, with stable mass effect and midline shift, restarting hot salts is not indicated as risks of increasing his diffuse peripheral edema outweigh benefits of possibly slightly reducing stroke edema.. -- Will continue to monitor.   Electronically signed: Dr. Caryl Pina

## 2018-10-06 NOTE — Progress Notes (Signed)
Again, Attempted call with uncle x2 and wife x 2, no responses

## 2018-10-06 NOTE — Procedures (Signed)
History: 48 year old male with recent stroke now with progressive altered mental status  Sedation: None  Technique: This is a 21 channel routine scalp EEG performed at the bedside with bipolar and monopolar montages arranged in accordance to the international 10/20 system of electrode placement. One channel was dedicated to EKG recording.    Background: The background consists predominantly of low voltage generalized irregular delta range activity.  There is reactivity with stimulation, with a poorly formed, poorly sustained posterior dominant rhythm of 7 to 8 Hz which is seen at times.  There is slight attenuation of faster frequencies on the right compared to the left.  Photic stimulation: Physiologic driving is not  EEG Abnormalities: 1) generalized irregular slow activity 2) slow posterior dominant rhythm 3) asymmetric posterior dominant rhythm  Clinical Interpretation: This EEG is consistent with a focal right hemispheric cerebral dysfunction consistent with the patient's known stroke in the setting of a generalized nonspecific cerebral dysfunction (encephalopathy).  There was no seizure or seizure predisposition recorded on this study.    Ritta Slot, MD [facility] [phone]  If 7pm- 7am, please page neurology on call as listed in [facility].

## 2018-10-06 NOTE — Progress Notes (Addendum)
Pt found with no movement to pain on bilateral lower extremities, and decreased movement on the RUE.  Pt will not follow commands as he was earlier.  MD paged    MD will order EEG to rule out seizures

## 2018-10-07 ENCOUNTER — Inpatient Hospital Stay (HOSPITAL_COMMUNITY): Payer: BLUE CROSS/BLUE SHIELD

## 2018-10-07 ENCOUNTER — Other Ambulatory Visit (HOSPITAL_COMMUNITY): Payer: Self-pay

## 2018-10-07 DIAGNOSIS — Z515 Encounter for palliative care: Secondary | ICD-10-CM

## 2018-10-07 DIAGNOSIS — Z7189 Other specified counseling: Secondary | ICD-10-CM

## 2018-10-07 LAB — GLUCOSE, CAPILLARY
Glucose-Capillary: 100 mg/dL — ABNORMAL HIGH (ref 70–99)
Glucose-Capillary: 121 mg/dL — ABNORMAL HIGH (ref 70–99)
Glucose-Capillary: 125 mg/dL — ABNORMAL HIGH (ref 70–99)
Glucose-Capillary: 131 mg/dL — ABNORMAL HIGH (ref 70–99)
Glucose-Capillary: 132 mg/dL — ABNORMAL HIGH (ref 70–99)
Glucose-Capillary: 178 mg/dL — ABNORMAL HIGH (ref 70–99)

## 2018-10-07 LAB — SODIUM: Sodium: 148 mmol/L — ABNORMAL HIGH (ref 135–145)

## 2018-10-07 NOTE — Progress Notes (Signed)
Have attempted several times to connect a uncle for elink camera communication, had bedside nurse to speak with participate which he stated he tried several times with another elink RN the night before without success

## 2018-10-07 NOTE — Progress Notes (Signed)
Lab unable to draw blood multiple times today due to swelling and edema of the arms.  Neurology notified.  Johnathan Archer said just try again for morning labs tomorrow.

## 2018-10-07 NOTE — Plan of Care (Signed)
Family requested a video visit at this time but patient off unit

## 2018-10-07 NOTE — Plan of Care (Signed)
Attempted to call pt's uncle x 2 for video chat with no response.

## 2018-10-07 NOTE — Progress Notes (Signed)
STROKE TEAM PROGRESS NOTE   INTERVAL HISTORY Patient`s neurological status remains poor and he has been found to be posturing in the left upper extremity to stimulation.  EEG done yesterday does not show any seizure activity but shows right hemispheric slowing.  He has temperature spike 101.5 today and blood pressure continues to fluctuate.  His white count however is not elevated.  He was started on antibiotics earlier by critical care MD.  Serum sodium is down to 148.  Right hand and forearm continues to be swollen with bullous swellings of the right hand.  No response to pain in the right upper extremity.  Good pulses at the right radial Vitals:   10/07/18 1100 10/07/18 1159 10/07/18 1200 10/07/18 1204  BP: (!) 172/89  (!) 93/58   Pulse:   83   Resp:   17   Temp:    98.4 F (36.9 C)  TempSrc:    Axillary  SpO2:  100% 99% 99%  Weight:      Height:        CBC:  Recent Labs  Lab 10/06/18 0649 10/06/18 1827  WBC 8.4 9.3  NEUTROABS  --  7.0  HGB 7.9* 7.2*  HCT 27.0* 25.2*  MCV 107.6* 106.8*  PLT 143* 187    Basic Metabolic Panel:  Recent Labs  Lab 10/02/18 0557  10/03/18 0630  10/05/18 0626  10/06/18 0649  10/06/18 1827 10/07/18 0029  NA 160*   < > 159*   < > 155*   < > 152*   < > 150* 148*  K 3.8   < > 4.2   < > 4.0  --  4.4  --   --   --   CL 124*  --  121*   < > 120*  --  117*  --   --   --   CO2 27  --  25   < > 25  --  23  --   --   --   GLUCOSE 204*  --  251*   < > 172*  --  145*  --   --   --   BUN 32*  --  36*   < > 28*  --  36*  --   --   --   CREATININE 1.75*  --  2.22*   < > 1.62*  --  1.79*  --   --   --   CALCIUM 8.6*  --  8.4*   < > 8.3*  --  8.3*  --   --   --   MG 2.4  --   --   --   --   --  3.0*  --   --   --   PHOS 3.2  --  5.1*  --   --   --   --   --   --   --    < > = values in this interval not displayed.    IMAGING past 24h No results found.   PHYSICAL EXAM  : General -obese middle-aged African-American male who is intubated but not on  sedation. Right upper extremity bullous lesions with hand swelling and bandage.   Cardiovascular - Regular rate and rhythm, not in afib.  Neurological Exam :  -   intubated.  Not sedated.  Comatose unresponsive.. , eyes deviated to the right with left gaze palsy., pupil bilateral 2.5 mm, sluggish reaction to light. No blinking to visual threat bilaterally,  s, left gaze palsy. Corneal reflex weak on the left but present on the right, gag and cough present.  Bilateral facial weakness. Tongue midline in mouth.  No spontaneous extremity movements.  Extensor posturing of the left upper extremity to sternal rub.  Trace withdrawal in bilateral lower extremities.  No movement in the right upper extremity.  DTR 1+ and left babinski. Sensation and coordination not cooperative and gait not tested.   ASSESSMENT/PLAN [name] is a 48 y.o. male with history of HTN, DB, CHF and AF on Xarelto found down at work, presenting with L hemiparesis and R gaze.   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 Early infarct R BG and insula.    CTA head & neck ELO R ICA, R M1, R M2. R A1 clot.   Cerebral angio R M1 TICI2b reperfusion  CT head hyperdensity R BG, contrast vs hmg. No mass effect  MRI  Large R MCA infarct, associated edema and mass effect w/o shift. prob small R cerebellar infarct. Small vessel disease.   MRA  Normal, previous R M1 open  2D Echo EF 60-65%. No source of embolus  Neurologic worsening 10/02/18, decreased LOC and sonorous breathing w/ aphasia likely due to inability to protect airway and slight increase in cytotoxic edema.  Further neurological worsening 10/06/2018 following temperature spike and blood pressure elevation.  Follow-up CT shows increased right frontal and parietal densities likely extension recent infarct  CT head - R MCA R BG,  perisylvian frontal and temporal lobes stable. Stable petechial hemorrhage. No new stroke. Increased edema and mass effect 10 mm R to L shift and effacement R lat ventricle (was 6 mm)  CTA head - no LVO, patent circ  CTA neck - no LVO or sign stenosis. Increased ground-glass opacification upper R lung  LDL 73  HgbA1c 8.9  Heparin 5000 units sq tid or VTE prophylaxis Xarelto (rivaroxaban) daily prior to admission, now on No antithrombotic.  On ASA 325, consider resume DOAC once able to swallow or permanent tube placed  Therapy recommendations: CIR  Disposition:  pending   Cerebral edema  Induced hypernatremia  09/28/2018 pt with right ptosis, right pupil dilation with reserved reflex, right gaze sustained nystagmus -> 09/29/2018 bilateral proptosis, minimal extraocular movement bilaterally, pupil equal sluggish to light, bilateral facial weakness, dysphagia  MRI right temporal pole edema with cerebral peduncle compression  CT repeat 09/29/2018- Increasing regional mass effect and edema with associated 4 mm right-to-left shift.   MRI repeat increased MLS to 6mm, brainstem compression stable  MRI repeat 10/07/2018 shows increased size of right hemispheric infarct with persistent cytotoxic edema and midline shift as well as new additional infarcts involving the right cerebellum, left midbrain, scattered left hemispheric subcortical regions  Treated with 3% protocol, drip off as at goal  Na 155   free water and 1/2 NS for gradual lowering Na  Continue to monitor  Cardiac Arrest 4/14  While in CT 4/14 for decreased LOC and respiratory compromise  epinephine & compressions  stabilized  Acute on Chronic Respiratory Failure w/ hypoxia and hypercapnia  OSA  Initial intubation in ED for IR  Extubated 4/10  reintubated 4/11 d/t increased WOB  Extubated 4/14  reintubated 4/15 d/t diminished cough and gag, snoring  trach planned for Mon w/ Trauma  CXR 10/06/18 am stable  atelectasis  no infiltrates  CCM onboard  Atrial Fibrillation w/ RVR & VT  Home anticoagulation:  Xarelto (rivaroxaban) daily  . Home meds: amiodarone 200 . On ASA 325  Consider resume DOAC once able to swallow or permanent tube placed   Chronic combined systolic and diastolic CHF   Home meds: Coreg 37.5 bid, lasix 60 bid, hydralazine 50 bid, isosorbide 30, entresto bid, spironolactone 25  CXR Oct 23, 2018 lungs clear  resumed home meds - Coreg 25 bid, lasix 60 bid, hydralazine 50 bid, isosorbide 30, entresto bid, spironolactone 25  Fever   Tmax 100.1  Blood culture no growth    urine culture no growth   resp culture normal respiratory flora  Continue hydration  CXR  Improving airspace R lung base. Resolved right pleural effusion  Unasyn 4/12>>4/16   CCM on board  Dysphagia  Secondary to stroke  On tube feeds via Cortrak  SLP following   PEG placement planned for Mon w/ Trauma  Urinary Retention  Foley inserted 4/14 after multiple I&O  Hypertension  lower  Home meds:  Coreg 37.5 bid, lasix 60 bid, hydralazine 50 bid, isosorbide 30, entresto bid, spironolactone 25  BP goal < 180  Off cleviprex  On hydralazine 100 bid, isosorbide 10 tid . Long-term BP goal normotensive  Hyperlipidemia  Home meds:  No statin  LDL 73, goal < 70  Add lipitor 20  Continue statin at discharge  Diabetes type II uncontrolled  Home meds:  Metformin 500 bid, insulin degludec 8u  HgbA1c 8.9, goal < 7.0  Hyperglycemia  CCM on board  Lantus 25u q hs  SSI novolog 0-15 q4h  Change Novolog 6u to q4h for TF coverage   Hold off metformin due to elevated Cre  CBGs q4  Other Stroke Risk Factors  Obesity, Body mass index is 30.53 kg/m., recommend weight loss, diet and exercise as appropriate   Obstructive sleep apnea  Nonischemic CM  Other Active Problems  AKI on CKD stage II Cr 1.67->1.75->2.22->1.62  Normocytic Anemia 12.5->10.2->9.0, MCV 105.5 - VB 12  normal and folate normal  Thrombocytopenia 241->151->153  Dorsal RUE hand and forearm w/ bulbous lesion - phlebitis/cellulitis - likely d/t med infiltration during code blue - tender - WOC RN recommended xeroform to area and change qod - Hand surgeon Janee Morn) does not feel c/w compartment syndrome, recommended elevation and warmth. No surgical intervention. If necrosis develops, please reconsult. - new blisters developed post consult - warm compresses and vascular checks to hand  Hospital day # 10  The patient's neurological exam has significantly worsened over the last 2-3 days .I have obtained a stat MRI scan of the brain and my preliminary reading suggest increased size of his right hemispheric infarct with cytotoxic edema and mild right to left shift but slight improvement compared to previous scan.  New infarcts in the right cerebral peduncle, right cerebellum, left knee due to as well as scattered tiny infarcts in the left periventricular and subcortical regions.  Discussed with patient and critical care team MD Dr Ollen Bowl.I also spoke to the patient's wife over the phone and her sister and gave an update about patient's neurological decline and MRI findings from this morning and new infarcts likely due to atrial fibrillation.  Patient's prognosis is now quite guarded is likely going to need prolonged ventilatory support and nutritional support and extend stay in rehab facility.  Family understand and would like to continue support for now.  Recommend postpone trach and PEG till family meeting to discuss goals  of care early next week.  This patient is critically ill and at significant risk of neurological worsening, death and care requires constant monitoring of vital signs, hemodynamics,respiratory and cardiac monitoring, extensive review of multiple databases, frequent neurological assessment, discussion with family, other specialists and medical decision making of high complexity.I have made any  additions or clarifications directly to the above note.This critical care time does not reflect procedure time, or teaching time or supervisory time of PA/NP/Med Resident etc but could involve care discussion time.  I spent 45 minutes of neurocritical care time  in the care of  this patient.      Delia Heady, MD Medical Director [facility] Pager: [phone] 10/07/2018 1:06 PM   To contact Stroke Continuity provider, please refer to [email or web address]. After hours, contact General Neurology

## 2018-10-07 NOTE — Consult Note (Signed)
Consultation Note Date: 10/07/2018   Patient Name: Johnathan Archer  DOB: 1971/03/07  MRN: [id number]  Age / Sex: 48 y.o., male  PCP: Sherilyn Cooter, NP Referring Physician: Garvin Fila, MD  Reason for Consultation: Establishing goals of care  HPI/Patient Profile: 48 y.o. male   admitted on 09/26/2018   Clinical Assessment and Goals of Care:  patient is a 48 year old gentleman with a past medical history significant for atrial fibrillation, chronic combined systolic and diastolic heart failure, diabetes, hypertension, nonischemic cardiomyopathy and obstructive sleep apnea.  Patient was at work when he was found by his coworkers unresponsive in the bathroom. Patient was brought into the hospital and treated as a code stroke with left-sided paralysis and with hypertensive emergency. Initial CT scan of the brain showed acute right MCA occlusion. Patient underwent intubation and mechanical ventilation for airway protection. Patient underwent thrombectomy and successful revascularization.  Patient has remained in the intensive care unit on the ventilator. He is suspected to have had a cardiac arrest event. Repeat imaging of the brain has shown worsening of his recent infarct. Brain imaging also shows increased edema and midline shift.  Initially, plans were in place for proceeding with tracheostomy and PEG tube placement and continued aggressive measures aimed towards stabilization/recovery.   However, patient's clinical course over the past 24-48 hours has only shown ongoing neurological decompensation. In addition, patient has had fluctuating blood pressures, has had a fever today. Patient has worsening peripheral edema. Patient was found to be posturing and left upper extremity to stimulation earlier today.  In light of the patient's ongoing neurological decline, in spite of aggressive life prolonging  measures, a palliative consultation has been requested for goals of care discussions.  After discussions with neurology colleague Dr. Leonie Man, I arrived at the bedside. Patient seen and examined. I met with the patient's wife Johnathan Archer, patient's 44 year old daughter their only child, patient's sister and one other family member was also present in the room.  Patient's family is currently very distraught and tearful. I introduced myself and palliative care as follows: Palliative medicine is specialized medical care for people living with serious illness. It focuses on providing relief from the symptoms and stress of a serious illness. The goal is to improve quality of life for both the patient and the family.  Brief life review performed. 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 Wife states that he was not having any complaints such as weakness headaches or vision changes. He left for work as per his routine, she got a call from his coworkers stating that the patient was found unresponsive in the bathroom.  Patient's wife is extremely tearful and states that she was hopeful for ongoing stabilization/recovery.  We discussed about the patient's most recent brain imaging. We discussed about the patient's current condition.  CODE STATUS discussions undertaken. Goals of care discussions undertaken. Discussed extensively about scope of DO NOT RESUSCITATE. I gave her information about compassionate liberation from the ventilator and establishment of comfort measures only.  The patient's family  currently is essentially in a state of shock. They are able to hear me and they're thankful for all the information provided by myself as well as neurology earlier today. At this point in time, it did not appear that the patient's family was at any point to make definitive medical decisions with regards to establishing DO NOT RESUSCITATE and comfort measures. I gave them my card.   Palliative care to follow-up in a.m.  Patient's family understands that he is critically ill and has a high risk for ongoing decline decompensation and even death in spite of any and all aggressive, life maintaining, life-prolonging measures that are being currently employed.   Offered active listening and supportive care. Acknowledged the difficult situation they find themselves in. All of their questions addressed to the best of my ability.   NEXT OF KIN  wife and daughter  SUMMARY OF RECOMMENDATIONS    remains full scope for now. Ongoing discussions with the patient's wife and family about the appropriateness of a more comfort focused/palliative approach given the degree of worsening neurological status. Palliative to continue to follow along. See above. Thank you for the consult.  Code Status/Advance Care Planning:  Full code    Symptom Management:      Palliative Prophylaxis:   Delirium Protocol  Additional Recommendations (Limitations, Scope, Preferences):  Full Scope Treatment  Psycho-social/Spiritual:   Desire for further Chaplaincy support:yes  Additional Recommendations: Caregiving  Support/Resources  Prognosis:   Guarded   Discharge Planning: To Be Determined      Primary Diagnoses: Present on Admission:  Ischemic stroke ([place])  Middle cerebral artery embolism, right  Essential hypertension  Acute on chronic respiratory failure with hypoxia and hypercapnia ([place])   I have reviewed the medical record, interviewed the patient and family, and examined the patient. The following aspects are pertinent.  Past Medical History:  Diagnosis Date   Atrial fibrillation with RVR ([place]) 10/2011   lone episode, converted with IV dilt, on coumadin given elevated CHADS2   Chest pain 03/06/2015   Myoview 07/2018: EF 33, no ischemia (EF normal by recent echo)   Chronic combined systolic and diastolic CHF (congestive heart failure) ([place])    Echo 2014: EF 20-25 // Echo 07/2018: mild LVE, EF 55, normal  RVSF, mod LAE, mod MR, mild PI, mild aortic root dilation (38 mm)   ED (erectile dysfunction)    History of chicken pox    History of noncompliance with medical treatment 03/06/2015   Hypertension    Hypertensive heart disease with heart failure ([place]) 03/28/2016   Hyperthyroidism 03/15/2016   Long term current use of anticoagulant therapy 01/14/2013   Mitral regurgitation    Moderate by echo 07/2011   Nonischemic cardiomyopathy ([place]) 12/27/2012   Normocytic anemia 03/15/2016   Obesity (BMI 30-39.9) 01/21/2014   OSA (obstructive sleep apnea)    Paroxysmal atrial fibrillation ([place]) 10/19/2011   lone episode, converted with IV dilt, on coumadin given elevated CHADS2    Systolic and diastolic CHF, chronic ([place]) 2004   a) Idiopathic dilated CM, 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  cath 2004 negative for obstruction, last echo 07/2011 EF 35%. b) Normal coronary arteries by cath in 2004 (when EF was noted at 15%). c) Last EF 20% by echo 12/2012   Type II diabetes mellitus, uncontrolled ([place])    Type II or unspecified type diabetes mellitus without mention of complication, uncontrolled    Warfarin anticoagulation    Social History   Socioeconomic History   Marital  status: Married    Spouse name: Not on file   Number of children: Not on file   Years of education: Not on file   Highest education level: Not on file  Occupational History   Not on file  Social Needs   Financial resource strain: Not on file   Food insecurity:    Worry: Not on file    Inability: Not on file   Transportation needs:    Medical: Not on file    Non-medical: Not on file  Tobacco Use   Smoking status: Never Smoker   Smokeless tobacco: Never Used  Substance and Sexual Activity   Alcohol use: No    Alcohol/week: 0.0 standard drinks    Comment: Occasional 1x/mo   Drug use: No   Sexual activity: Not on file  Lifestyle   Physical activity:    Days per week: Not on file    Minutes  per session: Not on file   Stress: Not on file  Relationships   Social connections:    Talks on phone: Not on file    Gets together: Not on file    Attends religious service: Not on file    Active member of club or organization: Not on file    Attends meetings of clubs or organizations: Not on file    Relationship status: Not on file  Other Topics Concern   Not on file  Social History Narrative   Caffeine: none   Lives with wife and daughter (21yo), no pets   Occupation: Scientist, clinical (histocompatibility and immunogenetics)   Edu: 41yrcollege   Activity: works, no regular activity   Diet: water daily, tries fruits/vegetables daily   Family History  Problem Relation Age of Onset   Diabetes Mother    Hypertension Father    Stroke Neg Hx    Coronary artery disease Neg Hx    Cancer Neg Hx    Heart attack Neg Hx    Thyroid disease Neg Hx    Scheduled Meds:  albuterol  2.5 mg Nebulization QID   amiodarone  200 mg Per Tube Daily   aspirin  325 mg Per Tube Daily   atorvastatin  20 mg Per Tube q1800   bethanechol  10 mg Oral TID   carvedilol  6.25 mg Per Tube BID   chlorhexidine gluconate (MEDLINE KIT)  15 mL Mouth Rinse BID   feeding supplement (GLUCERNA 1.5 CAL)  237 mL Per Tube 5 X Daily   feeding supplement (PRO-STAT SUGAR FREE 64)  30 mL Per Tube TID   free water  300 mL Per Tube Q4H   heparin injection (subcutaneous)  5,000 Units Subcutaneous Q8H   hydrALAZINE  100 mg Per Tube BID   insulin aspart  0-15 Units Subcutaneous Q4H   insulin aspart  6 Units Subcutaneous 5 X Daily   insulin glargine  25 Units Subcutaneous QHS   isosorbide dinitrate  10 mg Per Tube TID   mouth rinse  15 mL Mouth Rinse 10 times per day   pantoprazole  40 mg Oral Daily   Or   pantoprazole sodium  40 mg Per Tube Daily   vitamin B-12  2,000 mcg Oral Daily   Continuous Infusions:  sodium chloride Stopped (10/06/18 0523)   sodium chloride Stopped (10/03/18 0134)   PRN Meds:.sodium chloride,  acetaminophen **OR** acetaminophen (TYLENOL) oral liquid 160 mg/5 mL **OR** acetaminophen, bisacodyl, fentaNYL (SUBLIMAZE) injection, fentaNYL (SUBLIMAZE) injection, hydrALAZINE, iohexol, labetalol, midazolam, sennosides Medications Prior to Admission:  Prior to  Admission medications   Medication Sig Start Date End Date Taking? Authorizing Provider  amiodarone (PACERONE) 200 MG tablet Take 1 tablet (200 mg total) by mouth daily. 07/25/18  Yes Weaver, Scott T, PA-C  carvedilol (COREG) 25 MG tablet Take 1.5 tablets (37.5 mg total) by mouth 2 (two) times daily with a meal. 08/21/18  Yes Weaver, Scott T, PA-C  fexofenadine (ALLEGRA) 180 MG tablet Take 180 mg by mouth daily as needed for allergies or rhinitis.   Yes [provider]  furosemide (LASIX) 40 MG tablet Take 1.5 tablets (60 mg total) by mouth 2 (two) times daily. 08/08/18 11/06/18 Yes Weaver, Scott T, PA-C  hydrALAZINE (APRESOLINE) 50 MG tablet Take 1 tablet (50 mg total) by mouth 2 (two) times daily. 09/17/18  Yes Weaver, Scott T, PA-C  Insulin Glargine, 1 Unit Dial, (TOUJEO SOLOSTAR) 300 UNIT/ML SOPN Inject into the skin.   Yes [provider]  ipratropium (ATROVENT) 0.03 % nasal spray Place 2 sprays into both nostrils 2 (two) times daily.  08/30/18  Yes [provider]  isosorbide mononitrate (IMDUR) 30 MG 24 hr tablet Take 1 tablet (30 mg total) by mouth daily. 08/21/18  Yes Weaver, Scott T, PA-C  metFORMIN (GLUCOPHAGE) 500 MG tablet Take 1 tablet (500 mg total) by mouth 2 (two) times daily with a meal. Patient taking differently: Take 750 mg by mouth 2 (two) times daily with a meal.  10/27/15  Yes Rose, Kayla, PA-C  sacubitril-valsartan (ENTRESTO) 49-51 MG Take 1 tablet by mouth 2 (two) times daily. 08/08/18  Yes Richardson Dopp T, PA-C  spironolactone (ALDACTONE) 25 MG tablet Take 1 tablet (25 mg total) by mouth daily. 08/23/18  Yes Weaver, Scott T, PA-C  XARELTO 20 MG TABS tablet TAKE 1 TABLET BY MOUTH ONCE DAILY WITH  SUPPER Patient taking differently: Take 20 mg by mouth daily with breakfast.  07/25/18  Yes Weaver, Scott T, PA-C  glucose blood test strip 1 each by Other route as needed for other. Check cbg in morning and 2 hours after largest meal of day.    [provider]  Insulin Pen Needle 31G X 6 MM MISC by Does not apply route. Use for daily insulin injection.    [provider]   No Known Allergies Review of Systems Unresponsive on the vent.   Physical Exam Patient is intubated and mechanically ventilated. Patient is essentially unresponsive Patient does not follow commands. Patient does not open his eyes. Patient has generalized edema Shallow ventilator associated breaths S1-S2  Vital Signs: BP (!) 111/59    Pulse 75    Temp 98.1 F (36.7 C) (Axillary)    Resp 19    Ht 6' (1.829 m)    Wt 102.1 kg    SpO2 99%    BMI 30.53 kg/m  Pain Scale: CPOT   Pain Score: 0-No pain   SpO2: SpO2: 99 % O2 Device:SpO2: 99 % O2 Flow Rate: .O2 Flow Rate (L/min): 2 L/min  IO: Intake/output summary:   Intake/Output Summary (Last 24 hours) at 10/07/2018 1749 Last data filed at 10/07/2018 1115 Gross per 24 hour  Intake 700 ml  Output 975 ml  Net -275 ml    LBM: Last BM Date: 10/02/18 Baseline Weight: Weight: 105 kg Most recent weight: Weight: 102.1 kg     Palliative Assessment/Data:   PPS 10%  Time In:  1715 Time Out:  1825 Time Total:  70 min  Greater than 50%  of this time was spent counseling and  coordinating care related to the above assessment and plan.  Signed by: Loistine Chance, MD [phone] Please contact Palliative Medicine Team phone at [phone] for questions and concerns.  For individual provider: See Shea Evans

## 2018-10-07 NOTE — Progress Notes (Signed)
NAME:  Johnathan Archer, MRN:  [id number], DOB:  1970/07/07, LOS: 10 ADMISSION DATE:  10/07/2018, CONSULTATION DATE:  10/07/2018 REFERRING MD:  Dr. Loni Beckwith, CHIEF COMPLAINT:  CVA  Brief History   48 yoM, LSW 1000 am 4/9, presented with left sided paralysis and right gaze found to have acute right MCA M1 occlusion.  Difficult intubation for airway protection.  Taken for EVR with successful revascularization.    History of present illness   HPI obtained from medical chart review as patient is intubated/ sedated   48 year old male with history of Afib on xarelto, chronic systolic / dystolic HF, DMT2, HTN, MR, NICM, anemia, obesity, and OSA, last seen well around 1000 by coworkers found wedged between sink and bathroom stall at work.  Presented as code stroke with left sided paralysis and right gaze.  Hypertensive at 221/ 145.  Found on CT to have acute right MCA M1 occlusion.  Intubated 4/9  for airway protection, was a difficult intubation.  Taken to IR with successful revascularization of right MCA M1 occlusion achieving TICI 2b revascularization.  Returns to ICU, PCCM to consult for ventilator management.   Called back am 4/15 due to arrest in CT with ? Aspiration   Past Medical History  Afib on xarelto, chronic systolic / dystolic HF, DMT2, HTN, MR, NICM, anemia, obesity, OSA, ED   Significant Hospital Events   4/9 Admitted / EVR 4/10 Extubated, started on 3% saline 4/11 Remains on Cleviprex, Failed swallowing evaluation 4/10, left n.p.o. with gastric tube in place, fevers 4/12 Intubated early am for airway compromise/ repeat MRI, rising Na, 3% held, pan-cultured and started on abx  4/13  Re-extubated  4/15 reintubated p ? Asp event in Ct > for peg/trach  Consults:  Neurology   Procedures:  4/9 ETT >>4/10; 4/12 >> 4/13  4/9 R radial aline >> 4/9 cerebral angiogram  Re et  4/14  Significant Diagnostic Tests:  4/9 CTH >> 1. Hyperdense right MCA compatible with acute  embolus. Early infarct in the right basal ganglia and insula 2. 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 Poor flow in right MCA branches. Small amount of clot in right A1 segment. Findings compatible with acute embolus. No significant atherosclerotic disease in the neck or head.  4/9 CTH post intervention >> Hyperdensity within the right basal ganglia may indicate contrast staining or petechial hemorrhage. No significant mass effect.  4/10 MRI brain >> evolving right MCA M1 stroke, culprit vessel is patent.  Some petechial changes without overt hemorrhagic conversion, no shift  Echocardiogram 4/10 >> normal LV function 60-65%, mildly increased LV wall thickness without diastolic dysfunction, normal RV function, no evidence of shunt by bubble study,  4/12 MRI Brain >> continued interval evolution large right MCA CVA, scattered petechial hemorrhage slightly increased, increased gyral swelling with edema and regional mass-effect.  Right to left shift increased from 4 mm to 6 mm.  No hydrocephalus.  A few new subcentimeter acute left cerebral infarcts  Micro Data:  4/9 MRSA PCR >> neg  4/12 BC x 2 >> no growth 4/12 UC >> neg  4/12 trach asp >> mod gpc, few gnr >>> nl flora  4/15 Trach asp >>> mod wbc, few gpc >> 4/15 Resp panel by PCR >  Neg  4/15 Covid-19  Neg   Antimicrobials:  4/9 ancef preop  Unaysn 4/12-4/16  Interim history/subjective:  Follows commands.  Trauma discussed plan for trach next week. Currently has a phlebitis that needs treating T Max 100.1 + 5 L  Objective   Blood pressure (!) 93/58, pulse 83, temperature 98.4 F (36.9 C), temperature source Axillary, resp. rate 17, height 6' (1.829 m), weight 102.1 kg, SpO2 99 %.  RA    Vent Mode: PRVC FiO2 (%):  [40 %] 40 % Set Rate:  [18 bmp] 18 bmp Vt Set:  [620 mL] 620 mL PEEP:  [5 cmH20]  5 cmH20 Pressure Support:  [5 cmH20-10 cmH20] 5 cmH20 Plateau Pressure:  [17 cmH20-23 cmH20] 23 cmH20   Intake/Output Summary (Last 24 hours) at 10/07/2018 1350 Last data filed at 10/07/2018 1115 Gross per 24 hour  Intake 700 ml  Output 1200 ml  Net -500 ml   Filed Weights   10/02/18 1954 10/03/18 0436 10/07/18 0417  Weight: 95.6 kg 102 kg 102.1 kg   Physical Exam: General: Well-appearing, no acute distress, ETT is secure and intact HENT: [place], AT, ETT in place and secure Eyes: EOMI, no scleral icterus, does not open to command. Pupils equal, reactive Respiratory: BIlateral chest excursion, Clear to auscultation bilaterally.  No crackles, wheezing or rales Cardiovascular: RRR, -M/R/G,  GI: BS+, soft, nontender, ND, Obese Extremities:RUE edema to elbow with mild tenderness, wrapped with Xeroform dressing applied, brisk cap refill,   Neuro: Currently resting after pain med, follows commands on right arm and leg, left hemiparesis; toes upgoing Skin: Right hand blistering secondary to edema>> dressing dry and intact GU: Foley in place  Resolved Hospital Problem list    Assessment & Plan:   Right MCA M1 occlusion s/p EVR with successful revascularization Plan:  Deteriorating neurological status; to MRI today with evidence of more diffuse stroke pattern.  Palliative care consulted per Dr. Pearlean BrownieSethi   Acute respiratory failure, impaired airway protection and aspiration pneumonia Difficult airway OSA Completed unasyn x 5d  Weaning on 10/5 + 5 L Plan: Full vent support Wean as able Trauma consulted for trach. Plan for early next week Culture as is clinically indicated CXR 4/18   Acute renal failure UOP adequate Creatinine improving slowly Plan: Trend BMP  Monitor UOP Hold nephrotoxic agents  Hypernatremia Likely iatrogenic, consider hypovolemia Plan: 1/2 NS D5 Free water Trend Na  RUE edema Phlebitis? edema and skin sloughing Wound care has seen patient;  Plan:  Consult Ortho/Hand; note from yesterday--> continue conservative care Keep elevated Warm compresses Vascular checks to hand Kefzol for ? Developing cellulitis contributing to fevers.  Afib Elevated INR>> resolved Plan:  Xarelto on hold, defer to neurology as to when this can be restarted in light of the petechial changes on his MRI brain 4/10 and 4/12 Amiodarone restarted 4/11>>now off INR daily   Chronic systolic/ diastolic HF 2/20 EF 55%, normal RV, mod MR, mild dilation of aortic root.  Echocardiogram 4/11 with improved LV function and no evidence of impaired LV relaxation as above P:  Restarted home carvedilol at lower dose 4/11 Hold entresto for AKI  Hypertension, UDS negative BP remains high 4/17 P:  SBP goals < 180  = adequately rx'd  Continue coreg 25 mg BID, ASA, isordil 10 mg TID Increase hydralazine 100 mg BID Prn labetalol/hydralazine  DMT2 P:  CBG Q 4 Sliding-scale insulin  Increase lantus 25U daily Home metformin and Tresiba insulin are on hold  Best practice:  Diet: NPO, TF Pain/Anxiety/Delirium protocol (if indicated): sedate prn fentanyl VAP protocol (if indicated): no  longer indicated  DVT prophylaxis: SCDs/heparin sq  GI prophylaxis: PPI Glucose control: SSI resistant / lantus Mobility: BR Code Status: Full Family Communication: none at bedside Disposition: keep in NICU/ needs trach/peg per [facility]   CBC: Recent Labs  Lab 10/03/18 1153 10/03/18 2014 10/04/18 1104 10/05/18 0626 10/06/18 0649 10/06/18 1827  WBC 8.6  --  6.9 8.6 8.4 9.3  NEUTROABS  --   --   --   --   --  7.0  HGB 10.6* 9.2* 8.7* 9.0* 7.9* 7.2*  HCT 34.1* 27.0* 30.5* 31.8* 27.0* 25.2*  MCV 100.0  --  105.9* 106.7* 107.6* 106.8*  PLT PLATELET CLUMPS NOTED ON SMEAR, UNABLE TO ESTIMATE  --  129* 153 143* 187    Basic Metabolic Panel: Recent Labs  Lab 10/02/18 0557  10/03/18 0630  10/03/18 2014  10/04/18 1104  10/05/18 0626  10/05/18 2355 10/06/18 0649 10/06/18  1603 10/06/18 1827 10/07/18 0029  NA 160*   < > 159*   < > 160*   < > 157*   < > 155*   < > 153* 152* 149* 150* 148*  K 3.8   < > 4.2  --  3.6  --  3.8  --  4.0  --   --  4.4  --   --   --   CL 124*  --  121*  --   --   --  123*  --  120*  --   --  117*  --   --   --   CO2 27  --  25  --   --   --  28  --  25  --   --  23  --   --   --   GLUCOSE 204*  --  251*  --   --   --  230*  --  172*  --   --  145*  --   --   --   BUN 32*  --  36*  --   --   --  36*  --  28*  --   --  36*  --   --   --   CREATININE 1.75*  --  2.22*  --   --   --  1.86*  --  1.62*  --   --  1.79*  --   --   --   CALCIUM 8.6*  --  8.4*  --   --   --  7.9*  --  8.3*  --   --  8.3*  --   --   --   MG 2.4  --   --   --   --   --   --   --   --   --   --  3.0*  --   --   --   PHOS 3.2  --  5.1*  --   --   --   --   --   --   --   --   --   --   --   --    < > = values in this interval not displayed.   GFR: Estimated Creatinine Clearance: 62.4 mL/min (A) (by C-G formula based on SCr of 1.79 mg/dL (H)). Recent Labs  Lab 10/04/18 1104 10/05/18 0626 10/06/18 0649 10/06/18 1827  WBC 6.9 8.6 8.4 9.3    Liver Function Tests: No results for input(s): AST, ALT, ALKPHOS,  BILITOT, PROT, ALBUMIN in the last 168 hours. No results for input(s): LIPASE, AMYLASE in the last 168 hours. No results for input(s): AMMONIA in the last 168 hours.  ABG    Component Value Date/Time   PHART 7.441 10/03/2018 2014   PCO2ART 41.6 10/03/2018 2014   PO2ART 164.0 (H) 10/03/2018 2014   HCO3 28.2 (H) 10/03/2018 2014   TCO2 29 10/03/2018 2014   ACIDBASEDEF 2.0 09/30/2018 0624   O2SAT 100.0 10/03/2018 2014     Coagulation Profile: Recent Labs  Lab 10/04/18 1104 10/05/18 0626 10/06/18 0649  INR 1.2 1.2 1.2    Cardiac Enzymes: No results for input(s): CKTOTAL, CKMB, CKMBINDEX, TROPONINI in the last 168 hours.  HbA1C: Hgb A1c MFr Bld  Date/Time Value Ref Range Status  09/28/2018 02:24 AM 8.9 (H) 4.8 - 5.6 % Final    Comment:     (NOTE) Pre diabetes:          5.7%-6.4% Diabetes:              >6.4% Glycemic control for   <7.0% adults with diabetes   03/11/2017 07:41 PM 8.0 (H) 4.8 - 5.6 % Final    Comment:    (NOTE) Pre diabetes:          5.7%-6.4% Diabetes:              >6.4% Glycemic control for   <7.0% adults with diabetes     CBG: Recent Labs  Lab 10/06/18 1943 10/06/18 2324 10/07/18 0419 10/07/18 0808 10/07/18 1205  GLUCAP 129* 141* 125* 100* 121*    The patient is critically ill with multiple organ systems failure and requires high complexity decision making for assessment and support, frequent evaluation and titration of therapies, application of advanced monitoring technologies and extensive interpretation of multiple databases.   I have independently seen and examined the patient, reviewed data, and developed an assessment and plan. A total of 38 minutes were spent in critical care assessment and medical decision making. This critical care time does not reflect procedure time, or teaching time or supervisory time of PA/NP/Med student/Med Resident, etc but could involve care discussion time. Agree with the documentation above.   Gwynne Edinger, MD PhD  10/07/2018 1:50 PM

## 2018-10-08 DIAGNOSIS — D72829 Elevated white blood cell count, unspecified: Secondary | ICD-10-CM

## 2018-10-08 DIAGNOSIS — I6340 Cerebral infarction due to embolism of unspecified cerebral artery: Secondary | ICD-10-CM

## 2018-10-08 LAB — CBC
HCT: 27.5 % — ABNORMAL LOW (ref 39.0–52.0)
Hemoglobin: 7.9 g/dL — ABNORMAL LOW (ref 13.0–17.0)
MCH: 30.3 pg (ref 26.0–34.0)
MCHC: 28.7 g/dL — ABNORMAL LOW (ref 30.0–36.0)
MCV: 105.4 fL — ABNORMAL HIGH (ref 80.0–100.0)
Platelets: 249 10*3/uL (ref 150–400)
RBC: 2.61 MIL/uL — ABNORMAL LOW (ref 4.22–5.81)
RDW: 13.9 % (ref 11.5–15.5)
WBC: 12.9 10*3/uL — ABNORMAL HIGH (ref 4.0–10.5)
nRBC: 0 % (ref 0.0–0.2)

## 2018-10-08 LAB — BASIC METABOLIC PANEL
Anion gap: 12 (ref 5–15)
BUN: 54 mg/dL — ABNORMAL HIGH (ref 6–20)
CO2: 26 mmol/L (ref 22–32)
Calcium: 8.3 mg/dL — ABNORMAL LOW (ref 8.9–10.3)
Chloride: 110 mmol/L (ref 98–111)
Creatinine, Ser: 2.33 mg/dL — ABNORMAL HIGH (ref 0.61–1.24)
GFR calc Af Amer: 37 mL/min — ABNORMAL LOW (ref >60)
GFR calc non Af Amer: 32 mL/min — ABNORMAL LOW (ref >60)
Glucose, Bld: 142 mg/dL — ABNORMAL HIGH (ref 70–99)
Potassium: 4.5 mmol/L (ref 3.5–5.1)
Sodium: 148 mmol/L — ABNORMAL HIGH (ref 135–145)

## 2018-10-08 LAB — GLUCOSE, CAPILLARY
Glucose-Capillary: 125 mg/dL — ABNORMAL HIGH (ref 70–99)
Glucose-Capillary: 128 mg/dL — ABNORMAL HIGH (ref 70–99)
Glucose-Capillary: 132 mg/dL — ABNORMAL HIGH (ref 70–99)
Glucose-Capillary: 142 mg/dL — ABNORMAL HIGH (ref 70–99)
Glucose-Capillary: 151 mg/dL — ABNORMAL HIGH (ref 70–99)
Glucose-Capillary: 158 mg/dL — ABNORMAL HIGH (ref 70–99)

## 2018-10-08 LAB — PROTIME-INR
INR: 1.2 (ref 0.8–1.2)
Prothrombin Time: 14.8 seconds (ref 11.4–15.2)

## 2018-10-08 LAB — SODIUM: Sodium: 147 mmol/L — ABNORMAL HIGH (ref 135–145)

## 2018-10-08 MED ORDER — ALBUTEROL SULFATE (2.5 MG/3ML) 0.083% IN NEBU
2.5000 mg | INHALATION_SOLUTION | RESPIRATORY_TRACT | Status: DC | PRN
  Administered 2018-10-10: 11:00:00 2.5 mg via RESPIRATORY_TRACT
  Filled 2018-10-08: qty 3

## 2018-10-08 MED ORDER — GLYCOPYRROLATE 0.2 MG/ML IJ SOLN
0.1000 mg | Freq: Once | INTRAMUSCULAR | Status: AC
  Administered 2018-10-08: 0.1 mg via INTRAVENOUS
  Filled 2018-10-08: qty 1

## 2018-10-08 MED ORDER — GLYCOPYRROLATE 0.2 MG/ML IJ SOLN
0.1000 mg | Freq: Once | INTRAMUSCULAR | Status: DC
  Filled 2018-10-08: qty 1

## 2018-10-08 MED ORDER — FUROSEMIDE 10 MG/ML IJ SOLN: 40.0000 mg | Freq: Once | INTRAMUSCULAR | Status: DC

## 2018-10-08 NOTE — Progress Notes (Signed)
Pt's uncle called back with another cellphone number to use, this time camera connection took place for family. Family very grateful.

## 2018-10-08 NOTE — Progress Notes (Signed)
PT Cancellation Note  Patient Details Name: Johnathan Archer MRN: [id number] DOB: [birth year]   Cancelled Treatment:    Reason Eval/Treat Not Completed: Other (comment);Patient's level of consciousness Pt continues to have decreased arousal, not following commands and posturing involuntarily. Trach/peg delayed. Palliative care involved. Will continue to follow for appropriateness.    Blake Divine A Shawnique Mariotti 10/08/2018, 1:00 PM [name], PT, DPT Acute Rehabilitation Services Pager [phone] Office [phone]

## 2018-10-08 NOTE — Progress Notes (Signed)
Daily Progress Note   Patient Name: Johnathan Archer       Date: 10/08/2018 DOB: 02-Feb-1971  Age: 48 y.o. MRN#: [id number] Attending Physician: Rosalin Hawking, MD Primary Care Physician: Sherilyn Cooter, NP Admit Date: 09/26/2018  Reason for Consultation/Follow-up: Establishing goals of care  Subjective:  patient is currently being bathed by his bedside nurses, he has gag reflex and weak cough, he doesn't open eyes, doesn't follow commands, he has ongoing posturing movements, overall, appears neurologically the same as on 10-07-2018.   Length of Stay: 11  Current Medications: Scheduled Meds:  . albuterol  2.5 mg Nebulization QID  . amiodarone  200 mg Per Tube Daily  . aspirin  325 mg Per Tube Daily  . atorvastatin  20 mg Per Tube q1800  . bethanechol  10 mg Oral TID  . carvedilol  6.25 mg Per Tube BID  . chlorhexidine gluconate (MEDLINE KIT)  15 mL Mouth Rinse BID  . feeding supplement (GLUCERNA 1.5 CAL)  237 mL Per Tube 5 X Daily  . feeding supplement (PRO-STAT SUGAR FREE 64)  30 mL Per Tube TID  . free water  300 mL Per Tube Q4H  . glycopyrrolate  0.1 mg Intravenous Once  . heparin injection (subcutaneous)  5,000 Units Subcutaneous Q8H  . hydrALAZINE  100 mg Per Tube BID  . insulin aspart  0-15 Units Subcutaneous Q4H  . insulin aspart  6 Units Subcutaneous 5 X Daily  . insulin glargine  25 Units Subcutaneous QHS  . isosorbide dinitrate  10 mg Per Tube TID  . mouth rinse  15 mL Mouth Rinse 10 times per day  . pantoprazole  40 mg Oral Daily   Or  . pantoprazole sodium  40 mg Per Tube Daily  . vitamin B-12  2,000 mcg Oral Daily    Continuous Infusions: . sodium chloride Stopped (10/06/18 0523)  . sodium chloride Stopped (10/03/18 0134)    PRN Meds: sodium chloride,  acetaminophen **OR** acetaminophen (TYLENOL) oral liquid 160 mg/5 mL **OR** acetaminophen, bisacodyl, fentaNYL (SUBLIMAZE) injection, fentaNYL (SUBLIMAZE) injection, hydrALAZINE, iohexol, labetalol, midazolam, sennosides  Physical Exam         Generalized edema is noted, worse in UE than LE Abdomen some what distended He has ETT, he has OG tube, on bolus tube feeds Appears chronically ill Regular Vent  assisted breaths regular  Vital Signs: BP (!) 156/70   Pulse 87   Temp 99.7 F (37.6 C)   Resp 18   Ht 6' (1.829 m)   Wt 102.4 kg   SpO2 98%   BMI 30.62 kg/m  SpO2: SpO2: 98 % O2 Device: O2 Device: Ventilator O2 Flow Rate: O2 Flow Rate (L/min): 2 L/min  Intake/output summary:   Intake/Output Summary (Last 24 hours) at 10/08/2018 1128 Last data filed at 10/08/2018 0600 Gross per 24 hour  Intake 600 ml  Output 1025 ml  Net -425 ml   LBM: Last BM Date: 10/02/18 Baseline Weight: Weight: 105 kg Most recent weight: Weight: 102.4 kg       Palliative Assessment/Data:      Patient Active Problem List   Diagnosis Date Noted  . Palliative care by specialist   . Goals of care, counseling/discussion   . Acute on chronic respiratory failure with hypoxia and hypercapnia ([place]) 10/03/2018  . Aspiration into respiratory tract   . Endotracheal tube present   . HCAP (healthcare-associated pneumonia) 10/02/2018  . Acute hypernatremia 10/02/2018  . Essential hypertension 10/02/2018  . Ischemic stroke ([place]) 09/26/2018  . Middle cerebral artery embolism, right 09/29/2018  . Hypertensive heart disease with heart failure ([place]) 03/28/2016  . Normocytic anemia 03/15/2016  . Hyperthyroidism 03/15/2016  . Chronic combined systolic and diastolic CHF (congestive heart failure) ([place])   . History of noncompliance with medical treatment 03/06/2015  . Chest pain, atypical 03/06/2015  . Obesity (BMI 30-39.9) 01/21/2014  . Atrial fibrillation with rapid ventricular response ([place]) 07/05/2013  .  Long term current use of anticoagulant therapy 01/14/2013  . Nonischemic cardiomyopathy ([place]) 12/27/2012  . Paroxysmal atrial fibrillation ([place]) 10/19/2011  . ED (erectile dysfunction)   . Type II diabetes mellitus, uncontrolled ([place])   . OSA (obstructive sleep apnea)     Palliative Care Assessment & Plan   Patient Profile:  48 year old gentleman with a past medical history significant for atrial fibrillation, chronic combined systolic and diastolic heart failure, diabetes, hypertension, nonischemic cardiomyopathy and obstructive sleep apnea.  Patient was at work when he was found by his coworkers unresponsive in the bathroom. Patient was brought into the hospital and treated as a code stroke with left-sided paralysis and with hypertensive emergency. Initial CT scan of the brain showed acute right MCA occlusion. Patient underwent intubation and mechanical ventilation for airway protection. Patient underwent thrombectomy and successful revascularization.  Patient has remained in the intensive care unit on the ventilator. He is suspected to have had a cardiac arrest event. Repeat imaging of the brain has shown worsening of his recent infarct. Brain imaging also shows increased edema and midline shift.  Initially, plans were in place for proceeding with tracheostomy and PEG tube placement and continued aggressive measures aimed towards stabilization/recovery.   However, patient's clinical course over the past 24-48 hours has only shown ongoing neurological decompensation. In addition, patient has had fluctuating blood pressures, has had a fever today. Patient has worsening peripheral edema. Patient was found to be posturing and left upper extremity to stimulation earlier today.  In light of the patient's ongoing neurological decline, in spite of aggressive life prolonging measures, a palliative consultation has been requested for goals of care discussions.  Assessment:  vent dependent  resp failure Neurological decline Recent arrest History of A fib.  History of CHF DM HTN  Recommendations/Plan:  Call placed and discussed again with wife mrs Campos over the phone they visited with the patient in  the hospital on 10-07-2018, they have since, also done E link visits as well. We reviewed the patient's current condition.  The patient's wife requests update from neurology colleagues, she states that their 44 year old daughter isn't sleeping well and that the entire family including herself is "taking this very hard, but still believe in our prayers and faith". Acknowledged that her faith is a big source of support for her, offered active listening and supportive care. Wife is aware of TRACH/PEG being on hold for now, due to patient's decline over the weekend.   Patient's wife states that the patient had tears tricking down the side of his face last evening when she was by his bedside last evening. She also is concerned about his "posturing" movements.   She re iterates that she understands how critically ill the patient is. Gently brought up again about what one way extubation/comfort measures mean.   PMT to follow along, await further decisions from family. Will also ask chaplains to assist.    Goals of Care and Additional Recommendations:  Limitations on Scope of Treatment: Full Scope Treatment  Code Status: Code Status History    Date Active Date Inactive Code Status Order ID Comments User Context   09/25/2018 1642 10/02/2018 1050 Full Code [id number]  Luanne Bras, MD Inpatient   09/28/2018 1302 09/22/2018 1642 Full Code [id number]  Aroor, Lanice Schwab, MD ED   07/05/2013 1749 07/07/2013 1635 Full Code [id number]  Jonetta Osgood, MD Inpatient       Prognosis:   guarded   Discharge Planning:  To Be Determined  Care plan was discussed with  Patient's RN, patient's wife on phone.   Thank you for allowing the Palliative Medicine Team to assist in the care of this  patient.   Time In: 10 Time Out: 10.35 Total Time 35 Prolonged Time Billed  no       Greater than 50%  of this time was spent counseling and coordinating care related to the above assessment and plan.  Loistine Chance, MD [phone] Please contact Palliative Medicine Team phone at [phone] for questions and concerns.

## 2018-10-08 NOTE — Progress Notes (Signed)
Patient ID: Johnathan Archer, male   DOB: [birth year], 48 y.o.   MRN: [id number] Noted patient's further neurologic decline over the weekend. We will hold off on trach/PEG for now. Further goals of care discussions planned today.  Violeta Gelinas, MD, MPH, FACS Trauma: [phone] General Surgery: [phone]

## 2018-10-08 NOTE — Progress Notes (Signed)
NAME:  Johnathan Archer, MRN:  [id number], DOB:  11-03-1970, LOS: 11 ADMISSION DATE:  10/03/2018, CONSULTATION DATE:  09/22/2018 REFERRING MD:  Dr. Loni Beckwith, CHIEF COMPLAINT:  CVA  Brief History   57 yoM, LSW 1000 am 4/9, presented with left sided paralysis and right gaze found to have acute right MCA M1 occlusion.  Difficult intubation for airway protection.  Taken for EVR with successful revascularization.    History of present illness   HPI obtained from medical chart review as patient is intubated/ sedated   48 year old male with history of Afib on xarelto, chronic systolic / dystolic HF, DMT2, HTN, MR, NICM, anemia, obesity, and OSA, last seen well around 1000 by coworkers found wedged between sink and bathroom stall at work.  Presented as code stroke with left sided paralysis and right gaze.  Hypertensive at 221/ 145.  Found on CT to have acute right MCA M1 occlusion.  Intubated 4/9  for airway protection, was a difficult intubation.  Taken to IR with successful revascularization of right MCA M1 occlusion achieving TICI 2b revascularization.  Returns to ICU, PCCM to consult for ventilator management.   Called back am 4/15 due to arrest in CT with ? Aspiration   Past Medical History  Afib on xarelto, chronic systolic / dystolic HF, DMT2, HTN, MR, NICM, anemia, obesity, OSA, ED   Significant Hospital Events   4/9 Admitted / EVR 4/10 Extubated, started on 3% saline 4/11 Remains on Cleviprex, Failed swallowing evaluation 4/10, left n.p.o. with gastric tube in place, fevers 4/12 Intubated early am for airway compromise/ repeat MRI, rising Na, 3% held, pan-cultured and started on abx  4/13  Re-extubated  4/15 reintubated p ? Asp event in Ct > for peg/trach  Consults:  Neurology   Procedures:  4/9 ETT >>4/10; 4/12 >> 4/13  4/9 R radial aline >> 4/9 cerebral angiogram  Re et  4/14  Significant Diagnostic Tests:  4/9 CTH >> 1. Hyperdense right MCA compatible with acute  embolus. Early infarct in the right basal ganglia and insula 2. 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 Poor flow in right MCA branches. Small amount of clot in right A1 segment. Findings compatible with acute embolus. No significant atherosclerotic disease in the neck or head.  4/9 CTH post intervention >> Hyperdensity within the right basal ganglia may indicate contrast staining or petechial hemorrhage. No significant mass effect.  4/10 MRI brain >> evolving right MCA M1 stroke, culprit vessel is patent.  Some petechial changes without overt hemorrhagic conversion, no shift  Echocardiogram 4/10 >> normal LV function 60-65%, mildly increased LV wall thickness without diastolic dysfunction, normal RV function, no evidence of shunt by bubble study,  4/12 MRI Brain >> continued interval evolution large right MCA CVA, scattered petechial hemorrhage slightly increased, increased gyral swelling with edema and regional mass-effect.  Right to left shift increased from 4 mm to 6 mm.  No hydrocephalus.  A few new subcentimeter acute left cerebral infarcts  Micro Data:  4/9 MRSA PCR >> neg  4/12 BC x 2 >> no growth 4/12 UC >> neg  4/12 trach asp >> mod gpc, few gnr >>> nl flora  4/15 Trach asp >>> mod wbc, few gpc >> 4/15 Resp panel by PCR >  Neg  4/15 Covid-19  Neg   Antimicrobials:  4/9 ancef preop  [place] MaskUnaysn 4/12-4/16  Interim history/subjective:  Copious secretions Afebrile this morning PSV/CPAP this morning with increased work of breathing, back on full support   Objective   Blood pressure (!) 193/97, pulse 87, temperature 99.7 F (37.6 C), resp. rate 18, height 6' (1.829 m), weight 102.4 kg, SpO2 98 %.  RA    Vent Mode: PSV;CPAP FiO2 (%):  [30 %-40 %] 30 % Set Rate:  [18 bmp] 18 bmp Vt Set:  [620 mL] 620 mL PEEP:  [5 cmH20] 5 cmH20 Pressure Support:   [10 cmH20] 10 cmH20 Plateau Pressure:  [16 cmH20-23 cmH20] 16 cmH20   Intake/Output Summary (Last 24 hours) at 10/08/2018 1021 Last data filed at 10/08/2018 0600 Gross per 24 hour  Intake 600 ml  Output 1150 ml  Net -550 ml   Filed Weights   10/03/18 0436 10/07/18 0417 10/08/18 0500  Weight: 102 kg 102.1 kg 102.4 kg   Physical Exam: General: Ill appearing adult male, intubated, NAD  HENT: NCAT, ETT secure, copious thin clear secretions, anicteric sclera  Respiratory: Symmetrical chest expansion. CTA bilaterally. Accessory muscle recruitment on PSV/CPAP  Cardiovascular: RRR s1s2 no r/g/m, No JVD  GI: Soft, round, bowel sounds x4  Extremities: RUE non-pitting edema. BLE non-pitting edema. No cyanosis or clubbing  Neuro: Recent administration of analgesia. Withdraws from pain, does not follow commands. PERRL  Skin: Clean, dry, warm. Dressing on R Hand c/d/i. No rash     Resolved Hospital Problem list    Assessment & Plan:   Right MCA M1 occlusion s/p EVR with successful revascularization Plan:  Deteriorating neurological status; to MRI today with evidence of more diffuse stroke pattern.  Palliative care consulted per Dr. Pearlean BrownieSethi   Acute respiratory failure, impaired airway protection and aspiration pneumonia Difficult airway OSA Completed unasyn x 5d Net positive 2.4L  Plan: Continue full vent support AM SBT/WUA Trauma consulted for trach. Plan for early next week AM CXR Pulm hygiene 1x robinul for secretions   Acute kidney injury UOP adequate Creatinine improving slowly Plan: Trend BMP  Monitor UOP Minimize nephrotoxic medications   Hypernatremia, improving  Likely iatrogenic, consider hypovolemia Plan: KVO  Free water Trend Na  RUE edema Phlebitis? edema and skin sloughing S/p course of abx for ?cellulitis  Plan: Consult Ortho/Hand; note from yesterday--> continue conservative care Keep elevated Warm compresses Vascular checks to hand  Afib Elevated  INR>> resolved Plan:  Xarelto on hold, defer to neurology as to when this can be restarted in light of the petechial changes on his MRI brain 4/10 and 4/12 Amiodarone restarted 4/11>>now off Continue tele   Chronic systolic/ diastolic HF 2/20 EF 55%, normal RV, mod MR, mild dilation of aortic root.  Echocardiogram 4/11 with improved LV function and no evidence of impaired LV relaxation as above P:  Restarted home carvedilol at lower dose 4/11 Hold entresto for AKI  Hypertension, improved  UDS negative BP remains high 4/17 P:  SBP goals < 180  = adequately rx'd  Continue coreg 25 mg BID, ASA, isordil 10 mg TID Increase hydralazine 100 mg BID Prn labetalol/hydralazine  DM2  Home metformin and Tresiba insulin are on hold P:  CBG Q 4 Sliding-scale insulin  Increase lantus 25U daily   Goals of Care: PCM consulted by primary team, GOC discussion pending Trauma has been consulted for trach   Best practice:  Diet: EN  Pain/Anxiety/Delirium protocol (if indicated): sedate prn fentanyl, PRN versed  VAP protocol (if indicated): yes   DVT prophylaxis: SCDs/heparin sq  GI prophylaxis: PPI Glucose control: SSI resistant / lantus Mobility: BR Code Status: Full Family Communication: pending  Disposition: ICU   Labs   CBC: Recent Labs  Lab 10/04/18 1104 10/05/18 0626 10/06/18 0649 10/06/18 1827 10/08/18 0551  WBC 6.9 8.6 8.4 9.3 12.9*  NEUTROABS  --   --   --  7.0  --   HGB 8.7* 9.0* 7.9* 7.2* 7.9*  HCT 30.5* 31.8* 27.0* 25.2* 27.5*  MCV 105.9* 106.7* 107.6* 106.8* 105.4*  PLT 129* 153 143* 187 249    Basic Metabolic Panel: Recent Labs  Lab 10/02/18 0557  10/03/18 0630  10/03/18 2014  10/04/18 1104  10/05/18 0626  10/06/18 0649 10/06/18 1603 10/06/18 1827 10/07/18 0029 10/08/18 0551  NA 160*   < > 159*   < > 160*   < > 157*   < > 155*   < > 152* 149* 150* 148* 148*  K 3.8   < > 4.2  --  3.6  --  3.8  --  4.0  --  4.4  --   --   --  4.5  CL 124*  --  121*   --   --   --  123*  --  120*  --  117*  --   --   --  110  CO2 27  --  25  --   --   --  28  --  25  --  23  --   --   --  26  GLUCOSE 204*  --  251*  --   --   --  230*  --  172*  --  145*  --   --   --  142*  BUN 32*  --  36*  --   --   --  36*  --  28*  --  36*  --   --   --  54*  CREATININE 1.75*  --  2.22*  --   --   --  1.86*  --  1.62*  --  1.79*  --   --   --  2.33*  CALCIUM 8.6*  --  8.4*  --   --   --  7.9*  --  8.3*  --  8.3*  --   --   --  8.3*  MG 2.4  --   --   --   --   --   --   --   --   --  3.0*  --   --   --   --   PHOS 3.2  --  5.1*  --   --   --   --   --   --   --   --   --   --   --   --    < > = values in this interval not displayed.   GFR: Estimated Creatinine Clearance: 48 mL/min (A) (by C-G formula based on SCr of 2.33 mg/dL (H)). Recent Labs  Lab 10/05/18 0626 10/06/18 0649 10/06/18 1827 10/08/18 0551  WBC 8.6 8.4 9.3 12.9*    Liver Function Tests: No results for input(s): AST, ALT, ALKPHOS, BILITOT, PROT, ALBUMIN in the last 168 hours. No results for input(s): LIPASE, AMYLASE in the last 168 hours. No results for input(s): AMMONIA in the last 168 hours.  ABG    Component Value Date/Time   PHART 7.441 10/03/2018 2014   PCO2ART 41.6 10/03/2018 2014  PO2ART 164.0 (H) 10/03/2018 2014   HCO3 28.2 (H) 10/03/2018 2014   TCO2 29 10/03/2018 2014   ACIDBASEDEF 2.0 09/30/2018 0624   O2SAT 100.0 10/03/2018 2014     Coagulation Profile: Recent Labs  Lab 10/04/18 1104 10/05/18 0626 10/06/18 0649 10/08/18 0551  INR 1.2 1.2 1.2 1.2    Cardiac Enzymes: No results for input(s): CKTOTAL, CKMB, CKMBINDEX, TROPONINI in the last 168 hours.  HbA1C: Hgb A1c MFr Bld  Date/Time Value Ref Range Status  09/28/2018 02:24 AM 8.9 (H) 4.8 - 5.6 % Final    Comment:    (NOTE) Pre diabetes:          5.7%-6.4% Diabetes:              >6.4% Glycemic control for   <7.0% adults with diabetes   03/11/2017 07:41 PM 8.0 (H) 4.8 - 5.6 % Final    Comment:    (NOTE)  Pre diabetes:          5.7%-6.4% Diabetes:              >6.4% Glycemic control for   <7.0% adults with diabetes     CBG: Recent Labs  Lab 10/07/18 1511 10/07/18 1937 10/07/18 2338 10/08/18 0316 10/08/18 0817  GLUCAP 178* 132* 131* 128* 151*    Critical Care Time: 45 minutes  Tessie Fass MSN, AGACNP-BC [facility] [phone] If no answer, [phone] 10/08/2018, 11:08 AM

## 2018-10-08 NOTE — Progress Notes (Signed)
STROKE TEAM PROGRESS NOTE   INTERVAL HISTORY Pt still intubated on vent. Not responsive. Still has fever and leukocytosis. Pt declined from last Friday. Trauma team cancelled trach for now. I discussed with wife over the phone, she will need to discuss with her sister and other family members regarding GOC.   Vitals:   10/08/18 0600 10/08/18 0745 10/08/18 0800 10/08/18 0933  BP: (!) 169/80 (!) 146/67  (!) 193/97  Pulse: 90 83  87  Resp: (!) 21 18    Temp:   99.7 F (37.6 C)   TempSrc:      SpO2: 98% 98%    Weight:      Height:        CBC:  Recent Labs  Lab 10/06/18 1827 10/08/18 0551  WBC 9.3 12.9*  NEUTROABS 7.0  --   HGB 7.2* 7.9*  HCT 25.2* 27.5*  MCV 106.8* 105.4*  PLT 187 249    Basic Metabolic Panel:  Recent Labs  Lab 10/02/18 0557  10/03/18 0630  10/06/18 0649  10/07/18 0029 10/08/18 0551  NA 160*   < > 159*   < > 152*   < > 148* 148*  K 3.8   < > 4.2   < > 4.4  --   --  4.5  CL 124*  --  121*   < > 117*  --   --  110  CO2 27  --  25   < > 23  --   --  26  GLUCOSE 204*  --  251*   < > 145*  --   --  142*  BUN 32*  --  36*   < > 36*  --   --  54*  CREATININE 1.75*  --  2.22*   < > 1.79*  --   --  2.33*  CALCIUM 8.6*  --  8.4*   < > 8.3*  --   --  8.3*  MG 2.4  --   --   --  3.0*  --   --   --   PHOS 3.2  --  5.1*  --   --   --   --   --    < > = values in this interval not displayed.    IMAGING past 24h Mr [facility] Contrast  Result Date: 10/07/2018 CLINICAL DATA:  Follow-up right MCA infarct. Right M1 occlusion status post endovascular revascularization. EXAM: MRI HEAD WITHOUT CONTRAST MRA HEAD WITHOUT CONTRAST TECHNIQUE: Multiplanar, multiecho pulse sequences of the brain and surrounding structures were obtained without intravenous contrast. Angiographic images of the head were obtained using MRA technique without contrast. COMPARISON:  Head CT 10/06/2018, CTA 10/02/2018, MRI 09/30/2018, and MRA 09/28/2018 FINDINGS: MRI HEAD FINDINGS Brain: A large  early subacute right MCA infarct has progressed from the prior MRI, particularly in the posterior frontal lobe, parietal lobe, and superior and lateral occipital lobe, but it is similar in appearance to yesterday's CT. There is associated petechial hemorrhage throughout the areas of infarction, confluent in the right basal ganglia and right temporal lobes though without evidence of malignant hemorrhagic transformation. There is associated edema with partial effacement of the right lateral ventricle and 9 mm of leftward midline shift, progressed from the prior MRI but unchanged from the more recent CT. Additionally, compared to the prior MRI there are multiple new small acute infarcts in the left cerebral hemisphere with many in a parasagittal orientation in the centrum semiovale and corona radiata  as well as other cortical and subcortical infarcts in the frontal, parietal, and temporal lobes potentially reflecting watershed infarcts or emboli. There are also new small acute infarcts involving the genu of the corpus callosum, right cerebellar hemisphere, and anterior left medulla. Signal abnormality in the right cerebral peduncle may reflect acute wallerian degeneration. Small chronic infarcts are again noted in the right occipital lobe and right cerebellum. There is no extra-axial fluid collection. Vascular: Major intracranial vascular flow voids are preserved. Skull and upper cervical spine: No suspicious calvarial lesion. Diffusely diminished bone marrow T1 signal intensity in the cervical spine likely related to known anemia. Sinuses/Orbits: Unremarkable orbits. Extensive mucosal thickening in the paranasal sinuses with multiple scattered fluid levels as well as large mastoid effusions in the setting of intubation. Other: None. MRA HEAD FINDINGS The study is motion degraded. The visualized distal vertebral arteries are widely patent to the basilar and codominant. Patent left PICA, bilateral AICAs, and bilateral  SCAs are visualized. There is suggestion of a severe proximal left AICA stenosis which was not present on the prior MRA and may instead be artifactual. The basilar artery is widely patent. There are patent posterior communicating arteries bilaterally. The PCAs are patent with suggestion of a new severe stenosis near the right P1-P2 junction, however there is motion artifact in this region which may be contributing to the appearance. The internal carotid arteries are patent from skull base to carotid termini without evidence of significant stenosis allowing for motion artifact which is most notable through the right supraclinoid ICA. ACAs and MCAs are patent without evidence of proximal branch occlusion or significant A1 stenosis. There is no significant proximal M1 stenosis, however assessment of the distal M1 and proximal M2 segments is limited by motion artifact. No sizable intracranial aneurysm is identified. IMPRESSION: 1. Large subacute right MCA infarct, increased from 09/30/2018 MRI but similar in appearance to yesterday's CT. Associated petechial hemorrhage without malignant hemorrhagic transformation. 2. 10 mm leftward midline shift, unchanged from yesterday's CT. 3. Multiple new small acute infarcts in the left cerebral hemisphere, corpus callosum, medulla, and right cerebellum. 4. Motion degraded head MRA without large vessel occlusion. Questionable stenoses as above which may be artifactual. Electronically Signed   By: Sebastian Ache M.D.   On: 10/07/2018 14:02   Mr Brain Wo Contrast  Result Date: 10/07/2018 CLINICAL DATA:  Follow-up right MCA infarct. Right M1 occlusion status post endovascular revascularization. EXAM: MRI HEAD WITHOUT CONTRAST MRA HEAD WITHOUT CONTRAST TECHNIQUE: Multiplanar, multiecho pulse sequences of the brain and surrounding structures were obtained without intravenous contrast. Angiographic images of the head were obtained using MRA technique without contrast. COMPARISON:  Head  CT 10/06/2018, CTA 10/02/2018, MRI 09/30/2018, and MRA 09/28/2018 FINDINGS: MRI HEAD FINDINGS Brain: A large early subacute right MCA infarct has progressed from the prior MRI, particularly in the posterior frontal lobe, parietal lobe, and superior and lateral occipital lobe, but it is similar in appearance to yesterday's CT. There is associated petechial hemorrhage throughout the areas of infarction, confluent in the right basal ganglia and right temporal lobes though without evidence of malignant hemorrhagic transformation. There is associated edema with partial effacement of the right lateral ventricle and 9 mm of leftward midline shift, progressed from the prior MRI but unchanged from the more recent CT. Additionally, compared to the prior MRI there are multiple new small acute infarcts in the left cerebral hemisphere with many in a parasagittal orientation in the centrum semiovale and corona radiata as well as other cortical and  subcortical infarcts in the frontal, parietal, and temporal lobes potentially reflecting watershed infarcts or emboli. There are also new small acute infarcts involving the genu of the corpus callosum, right cerebellar hemisphere, and anterior left medulla. Signal abnormality in the right cerebral peduncle may reflect acute wallerian degeneration. Small chronic infarcts are again noted in the right occipital lobe and right cerebellum. There is no extra-axial fluid collection. Vascular: Major intracranial vascular flow voids are preserved. Skull and upper cervical spine: No suspicious calvarial lesion. Diffusely diminished bone marrow T1 signal intensity in the cervical spine likely related to known anemia. Sinuses/Orbits: Unremarkable orbits. Extensive mucosal thickening in the paranasal sinuses with multiple scattered fluid levels as well as large mastoid effusions in the setting of intubation. Other: None. MRA HEAD FINDINGS The study is motion degraded. The visualized distal vertebral  arteries are widely patent to the basilar and codominant. Patent left PICA, bilateral AICAs, and bilateral SCAs are visualized. There is suggestion of a severe proximal left AICA stenosis which was not present on the prior MRA and may instead be artifactual. The basilar artery is widely patent. There are patent posterior communicating arteries bilaterally. The PCAs are patent with suggestion of a new severe stenosis near the right P1-P2 junction, however there is motion artifact in this region which may be contributing to the appearance. The internal carotid arteries are patent from skull base to carotid termini without evidence of significant stenosis allowing for motion artifact which is most notable through the right supraclinoid ICA. ACAs and MCAs are patent without evidence of proximal branch occlusion or significant A1 stenosis. There is no significant proximal M1 stenosis, however assessment of the distal M1 and proximal M2 segments is limited by motion artifact. No sizable intracranial aneurysm is identified. IMPRESSION: 1. Large subacute right MCA infarct, increased from 09/30/2018 MRI but similar in appearance to yesterday's CT. Associated petechial hemorrhage without malignant hemorrhagic transformation. 2. 10 mm leftward midline shift, unchanged from yesterday's CT. 3. Multiple new small acute infarcts in the left cerebral hemisphere, corpus callosum, medulla, and right cerebellum. 4. Motion degraded head MRA without large vessel occlusion. Questionable stenoses as above which may be artifactual. Electronically Signed   By: Sebastian AcheAllen  Grady M.D.   On: 10/07/2018 14:02     PHYSICAL EXAM: General -obese middle-aged African-American male who is intubated but not on sedation. Right upper extremity bullous lesions with hand swelling and bandage.   Cardiovascular - Regular rate and rhythm, not in afib. Neurological Exam - Intubated on ventilation, not sedated.  Comatose unresponsive. Eyes mid position, slight  disconjugate eyes, pupil bilateral 2.5 mm, sluggish reaction to light. No blinking to visual threat bilaterally. Corneal reflex weak on the left but present on the right, gag and cough present.  Facial symmetry not able to test due to ET tube.  Tongue midline in mouth.  No spontaneous extremity movements.  Extensor posturing of the left upper extremity to sternal rub.  Trace withdrawal in bilateral lower extremities.  No movement in the right upper extremity.  DTR diminished and  bilateral babinski. Sensation and coordination not cooperative and gait not tested.   ASSESSMENT/PLAN [name] is a 48 y.o. 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 Early infarct R BG and insula.    CTA head & neck ELO R ICA, R M1, R M2. R A1 clot.   Cerebral angio R M1 TICI2b reperfusion  CT head hyperdensity R BG, contrast vs hmg. No mass effect  MRI  Large R MCA infarct, associated edema and mass effect w/o shift. prob small R cerebellar infarct. Small vessel disease.   MRA  Normal, previous R M1 open  2D Echo EF 60-65%. No source of embolus   Neurologic worsening 10/02/18, decreased LOC and sonorous breathing w/ aphasia likely due to inability to protect airway and slight increase in cytotoxic edema.    CT head - R MCA R BG, perisylvian frontal and temporal lobes stable. Stable petechial hemorrhage. No new stroke. Increased edema and mass effect 10 mm R to L shift and effacement R lat ventricle (was 6 mm)  CTA head - no LVO, patent circ  CTA neck - no LVO or sign stenosis. Increased ground-glass opacification upper R lung  Further  neurological worsening 10/06/2018 following temperature spike and blood pressure elevation.  Follow-up CT shows increased right frontal and parietal densities likely extension recent infarct  CT head - large R MCA infarct increased in size w/ extension into R parietal and frontal love. Stable mass effect w/ 10 mm shift and effacement R ventricle.   MRI 4/20 large subacute R MCA infarct w/ petechial HT. 10 mm L shift. Mult new small infarcts L cerebral, corpus callosum, medulla and R cerebellum  MRA - motion decraded w/o LVO  EEG no sz  LDL 73  HgbA1c 8.9  Heparin 5000 units sq tid or VTE prophylaxis Xarelto (rivaroxaban) daily prior to admission, now on aspirin 325 mg daily   Poor neuro prognosis. Had long discussion with wife and sister over the phone today, updated pt current condition, treatment plan and poor prognosis. They expressed understanding and appreciation. They would like to discuss among families and will talk to me again tomorrow.  Palliative consulted and continue to follow - help appreciated  Cerebral edema  Induced hypernatremia  09/28/2018 pt with right ptosis, right pupil dilation with reserved reflex, right gaze sustained nystagmus -> 09/29/2018 bilateral proptosis, minimal extraocular movement bilaterally, pupil equal sluggish to light, bilateral facial weakness, dysphagia  MRI right temporal pole edema with cerebral peduncle compression  CT repeat 09/29/2018- Increasing regional mass effect and edema with associated 4 mm right-to-left shift.   MRI repeat increased MLS to 6mm, brainstem compression stable  MRI repeat 10/07/2018 shows increased size of right hemispheric infarct with persistent cytotoxic edema and midline shift   Off 3% saline now  Na 153->150->148  free water for gradual lowering Na  Continue to monitor  Cardiac Arrest 10/02/18  While in CT 4/14 for decreased LOC and respiratory compromise  epinephine & compressions  stabilized  Acute  on Chronic Respiratory Failure w/ hypoxia and hypercapnia  OSA  Initial intubation in ED for IR  Extubated 4/10  reintubated 4/11 d/t increased WOB  Extubated 4/14  reintubated 4/15 d/t diminished cough and gag, snoring  trach planned for Mon w/ Trauma but on hold d/t neuro worsening  CCM onboard  Atrial Fibrillation w/ RVR & VT  Home anticoagulation:  Xarelto (rivaroxaban) daily  . Home meds: amiodarone 200 . On ASA 325 . Resume home amiodarone 200 daily   Chronic combined systolic and diastolic CHF  Home meds: Coreg 37.5 bid, lasix 60 bid, hydralazine 50 bid, isosorbide 30, entresto bid, spironolactone 25  CXR 09/29/2018 lungs clear  Now on Coreg 6.25 bid, hydralazine 100 bid, isosorbide 10 tid  Fever and leukocytosis  Tmax 101.5->100.8  WBC 9.3->12.9  Blood culture no growth    urine culture no growth   resp culture normal respiratory flora  CXR  Improving airspace R lung base. Resolved right pleural effusion  Unasyn 4/12>>4/16   Now on Ancef 4/18>>  CCM on board  Dysphagia  Secondary to stroke  On bolus tube feeds via Cortrak  SLP following   PEG placement planned for Mon w/ Trauma on hold d/t neuro worsening  Hypertension  Stable   Home meds:  Coreg 37.5 bid, lasix 60 bid, hydralazine 50 bid, isosorbide 30, entresto bid, spironolactone 25  BP goal < 180  Off cleviprex  On hydralazine 100 bid, isosorbide 10 tid, coreg 6.25 bid . Long-term BP goal normotensive  Hyperlipidemia  Home meds:  No statin  LDL 73, goal < 70  Add lipitor 20  Continue statin at discharge  Diabetes type II uncontrolled  Home meds:  Metformin 500 bid, insulin degludec 8u  HgbA1c 8.9, goal < 7.0  Hyperglycemia improved  CCM on board  Lantus 25u q hs  SSI novolog 0-15 q4h  Novolog 6u with TF    Hold off metformin due to elevated Cre  CBGs q4h  Other Stroke Risk Factors  Obesity, Body mass index is 30.62 kg/m., recommend weight loss, diet  and exercise as appropriate   Obstructive sleep apnea  Nonischemic CM  Other Active Problems  AKI on CKD stage II Cr 1.79->2.33  Normocytic Anemia 12.5->10.2->9.0>7.9->7.2->7.9, MCV 105.5 - VB 12 normal and folate normal  Thrombocytopenia 241->151->153->249  Dorsal RUE hand and forearm w/ bulbous lesion - phlebitis/cellulitis - likely d/t med infiltration during code blue - tender - WOC RN recommended xeroform to area and change qod - Hand surgeon Janee Morn) does not feel c/w compartment syndrome, recommended elevation and warmth. No surgical intervention.  new blisters developed post consult - warm compresses and vascular checks to hand - ongoing conservative tx - to check back this week  Hospital day # 11  This patient is critically ill due to right MCA large infarct, A. fib RVR, diabetes, hyperglycemia, CHF and at significant risk of neurological worsening, death form recurrent stroke, hemorrhagic conversion, heart failure, seizure, sepsis, aspiration pneumonia. This patient's care requires constant monitoring of vital signs, hemodynamics, respiratory and cardiac monitoring, review of multiple databases, neurological assessment, discussion with family, other specialists and medical decision making of high complexity. I spent 40 minutes of neurocritical care time in the care of this patient. I had long discussion with pt wife Helmut Muster and her sister over the phone, updated pt current condition, treatment plan and poor prognosis. They expressed understanding and appreciation. Regarding palliative care, they would like to discuss among families and let me know tomorrow.  Marvel Plan, MD PhD Stroke Neurology 10/08/2018 5:36 PM    To contact Stroke Continuity provider, please refer to [email or web address]. After hours, contact General Neurology

## 2018-10-09 ENCOUNTER — Inpatient Hospital Stay (HOSPITAL_COMMUNITY): Payer: BLUE CROSS/BLUE SHIELD

## 2018-10-09 DIAGNOSIS — N179 Acute kidney failure, unspecified: Secondary | ICD-10-CM

## 2018-10-09 DIAGNOSIS — Z515 Encounter for palliative care: Secondary | ICD-10-CM

## 2018-10-09 LAB — CBC
HCT: 26 % — ABNORMAL LOW (ref 39.0–52.0)
Hemoglobin: 7.9 g/dL — ABNORMAL LOW (ref 13.0–17.0)
MCH: 31.6 pg (ref 26.0–34.0)
MCHC: 30.4 g/dL (ref 30.0–36.0)
MCV: 104 fL — ABNORMAL HIGH (ref 80.0–100.0)
Platelets: 234 10*3/uL (ref 150–400)
RBC: 2.5 MIL/uL — ABNORMAL LOW (ref 4.22–5.81)
RDW: 13.5 % (ref 11.5–15.5)
WBC: 12.6 10*3/uL — ABNORMAL HIGH (ref 4.0–10.5)
nRBC: 0 % (ref 0.0–0.2)

## 2018-10-09 LAB — URINALYSIS, ROUTINE W REFLEX MICROSCOPIC
Bilirubin Urine: NEGATIVE
Glucose, UA: NEGATIVE mg/dL
Ketones, ur: NEGATIVE mg/dL
Nitrite: NEGATIVE
Protein, ur: 30 mg/dL — AB
Specific Gravity, Urine: 1.021 (ref 1.005–1.030)
pH: 5 (ref 5.0–8.0)

## 2018-10-09 LAB — BASIC METABOLIC PANEL
Anion gap: 12 (ref 5–15)
BUN: 50 mg/dL — ABNORMAL HIGH (ref 6–20)
CO2: 25 mmol/L (ref 22–32)
Calcium: 8.5 mg/dL — ABNORMAL LOW (ref 8.9–10.3)
Chloride: 111 mmol/L (ref 98–111)
Creatinine, Ser: 1.75 mg/dL — ABNORMAL HIGH (ref 0.61–1.24)
GFR calc Af Amer: 52 mL/min — ABNORMAL LOW (ref >60)
GFR calc non Af Amer: 45 mL/min — ABNORMAL LOW (ref >60)
Glucose, Bld: 94 mg/dL (ref 70–99)
Potassium: 5.4 mmol/L — ABNORMAL HIGH (ref 3.5–5.1)
Sodium: 148 mmol/L — ABNORMAL HIGH (ref 135–145)

## 2018-10-09 LAB — GLUCOSE, CAPILLARY
Glucose-Capillary: 131 mg/dL — ABNORMAL HIGH (ref 70–99)
Glucose-Capillary: 142 mg/dL — ABNORMAL HIGH (ref 70–99)
Glucose-Capillary: 144 mg/dL — ABNORMAL HIGH (ref 70–99)
Glucose-Capillary: 152 mg/dL — ABNORMAL HIGH (ref 70–99)
Glucose-Capillary: 153 mg/dL — ABNORMAL HIGH (ref 70–99)
Glucose-Capillary: 95 mg/dL (ref 70–99)

## 2018-10-09 MED ORDER — FREE WATER
350.0000 mL | Status: DC
  Administered 2018-10-09 – 2018-10-11 (×11): 350 mL

## 2018-10-09 MED ORDER — GLYCOPYRROLATE 0.2 MG/ML IJ SOLN
0.1000 mg | Freq: Once | INTRAMUSCULAR | Status: AC
  Administered 2018-10-09: 0.1 mg via INTRAVENOUS
  Filled 2018-10-09: qty 1

## 2018-10-09 MED ORDER — SODIUM POLYSTYRENE SULFONATE 15 GM/60ML PO SUSP
15.0000 g | Freq: Once | ORAL | Status: AC
  Administered 2018-10-09: 15 g via ORAL
  Filled 2018-10-09: qty 60

## 2018-10-09 NOTE — Progress Notes (Signed)
NAME:  Johnathan Archer, MRN:  [id number], DOB:  Apr 30, 1971, LOS: 12 ADMISSION DATE:  October 09, 2018, CONSULTATION DATE:  2018/10/09 REFERRING MD:  Dr. Loni Beckwith, CHIEF COMPLAINT:  CVA  Brief History   48 yoM, LSW 1000 am 4/9, presented with left sided paralysis and right gaze found to have acute right MCA M1 occlusion.  Difficult intubation for airway protection.  Taken for EVR with successful revascularization.    History of present illness   HPI obtained from medical chart review as patient is intubated/ sedated   48 year old male with history of Afib on xarelto, chronic systolic / dystolic HF, DMT2, HTN, MR, NICM, anemia, obesity, and OSA, last seen well around 1000 by coworkers found wedged between sink and bathroom stall at work.  Presented as code stroke with left sided paralysis and right gaze.  Hypertensive at 221/ 145.  Found on CT to have acute right MCA M1 occlusion.  Intubated 4/9  for airway protection, was a difficult intubation.  Taken to IR with successful revascularization of right MCA M1 occlusion achieving TICI 2b revascularization.  Returns to ICU, PCCM to consult for ventilator management.   Called back am 4/15 due to arrest in CT with ? Aspiration   Past Medical History  Afib on xarelto, chronic systolic / dystolic HF, DMT2, HTN, MR, NICM, anemia, obesity, OSA, ED   Significant Hospital Events   4/9 Admitted / EVR 4/10 Extubated, started on 3% saline 4/11 Remains on Cleviprex, Failed swallowing evaluation 4/10, left n.p.o. with gastric tube in place, fevers 4/12 Intubated early am for airway compromise/ repeat MRI, rising Na, 3% held, pan-cultured and started on abx  4/13  Re-extubated  4/15 reintubated p ? Asp event in Ct > for peg/trach  Consults:  Neurology   Procedures:  4/9 ETT >>4/10; 4/12 >> 4/13  4/9 R radial aline >> 4/9 cerebral angiogram  Re et  4/14  Significant Diagnostic Tests:  4/9 CTH >> 1. Hyperdense right MCA compatible with acute  embolus. Early infarct in the right basal ganglia and insula 2. 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 Poor flow in right MCA branches. Small amount of clot in right A1 segment. Findings compatible with acute embolus. No significant atherosclerotic disease in the neck or head.  4/9 CTH post intervention >> Hyperdensity within the right basal ganglia may indicate contrast staining or petechial hemorrhage. No significant mass effect.  4/10 MRI brain >> evolving right MCA M1 stroke, culprit vessel is patent.  Some petechial changes without overt hemorrhagic conversion, no shift  Echocardiogram 4/10 >> normal LV function 60-65%, mildly increased LV wall thickness without diastolic dysfunction, normal RV function, no evidence of shunt by bubble study,  4/12 MRI Brain >> continued interval evolution large right MCA CVA, scattered petechial hemorrhage slightly increased, increased gyral swelling with edema and regional mass-effect.  Right to left shift increased from 4 mm to 6 mm.  No hydrocephalus.  A few new subcentimeter acute left cerebral infarcts  Micro Data:  4/9 MRSA PCR >> neg  4/12 BC x 2 >> no growth 4/12 UC >> neg  4/12 trach asp >> mod gpc, few gnr >>> nl flora  4/15 Trach asp >>> mod wbc, few gpc >> 4/15 Resp panel by PCR >  Neg  4/15 Covid-19  Neg   Antimicrobials:  4/9 ancef preop  Aurora Mask 4/12-4/16  Interim history/subjective:  Febrile TMax 101.2  Tolerating PSV/CPAP without distress this morning   Objective   Blood pressure (!) 151/80, pulse 93, temperature (!) 101.2 F (38.4 C), temperature source Axillary, resp. rate (!) 23, height 6' (1.829 m), weight 102.7 kg, SpO2 99 %.  RA    Vent Mode: PSV;CPAP FiO2 (%):  [30 %] 30 % Set Rate:  [18 bmp] 18 bmp Vt Set:  [620 mL] 620 mL PEEP:  [5 cmH20] 5 cmH20 Pressure Support:  [10 cmH20] 10  cmH20 Plateau Pressure:  [16 cmH20-26 cmH20] 26 cmH20   Intake/Output Summary (Last 24 hours) at 10/09/2018 1002 Last data filed at 10/09/2018 0959 Gross per 24 hour  Intake 2172 ml  Output 2150 ml  Net 22 ml   Filed Weights   10/07/18 0417 10/08/18 0500 10/09/18 0500  Weight: 102.1 kg 102.4 kg 102.7 kg   Physical Exam: General: Critically ill appearing adult M, intubated, NAD  HENT: NCAT, ETT secure, anicteric sclera, trachea midline.  Respiratory: No accessory muscle recruitment, Symmetrical excursion, CTA bilaterally   Cardiovascular: Atrial Fib, s1s2, no r/g/m. No JVD GI: Soft, round, normoactive x4  Extremities:  RUE non-pitting edema around former IV site. No erythema. R hand in bandage. BLE non-pitting edema. No cyanosis or clubbing.  Neuro: Does not follow commands. Disconjugate gaze. Does not blink to threat.Weak gag/cough. Localizes to pain. PERRL but sluggish, 3mm  Skin: Clean, dry, warm. Bandage to R hand clean, dry, intact.    Resolved Hospital Problem list    Assessment & Plan:   Right MCA M1 occlusion s/p EVR with successful revascularization Plan:  Deteriorating neurological status; to MRI today with evidence of more diffuse stroke pattern.  Palliative care consulted per Dr. Pearlean Brownie CVA management per Neurology  Agree with [facility] consult and appreciate PCM recs   Acute respiratory failure requiring re-intubation  Inability to protect airway Aspiration PNA OSA Difficult Airway  Completed unasyn x 5d Plan: Continue full vent support Continue AM WUA/SBT  Mental status will preclude extubation and patient will need trach with Trauma if family does not transition to comfort care Repeating robinul for copious oral secretions AM CXR Pulm hygiene   Acute kidney injury, improving  UOP adequate Cr improving  Plan: Trend BMP  Monitor UOP Minimize nephrotoxic medications  Increasing FWF as below  Hypernatremia, mild  Likely iatrogenic, consider hypovolemia  however overall picture does not suggest hypovolemia nor net fluid status  Plan: Continue to trend Na Increase FWF to 350 q4  Hyperkalemia P Kayexalate  Continue to trend BMP  RUE edema  Phlebitis? edema and skin sloughing S/p course of abx for ?cellulitis  Plan: Per ortho/hand, continue conservative care  Keep elevated Warm compresses Vascular checks to hand Wound care   A fib  Home xarelto, initially elevated INR   Xarelto on hold, defer to neurology as to when this can be restarted in light of the petechial changes on his MRI brain 4/10 and 4/12 Plan:  Holding xarelto Continue amiodarone Continue tele  Chronic systolic and diastolic heart failure with reduced EF  2/20 EF 55%, normal RV, mod MR, mild dilation of aortic root.  Echocardiogram 4/11 with improved LV function and no evidence of impaired LV relaxation as above P:  Hold entresto for AKI  Hypertension  UDS negative BP remains high 4/17 P:   Continue coreg, isordil, hydral PRN labetalol, PRN hydral   DM2  Home metformin and Tresiba held P:  CBG Q 4 SSI  Lantus 25u qD  SIRS -Febrile, mild leukocytosis -May be central fever, reactive leukocytosis  P BCx, Tracheal aspirate, UA No abx at this time       Goals of Care: [facility] and neurology involved in GOC discussions with family.  Family would like to discuss with other family members. Plan for [facility] and neurology to discuss again 4/21 Trauma consulted for trach/PEG if family does not transition to comfort care   Best practice:  Diet: EN  Pain/Anxiety/Delirium protocol (if indicated): PRN versed, PRN fentanyl VAP protocol (if indicated): yes   DVT prophylaxis:SQ heparin, SCDs  GI prophylaxis: PPI Glucose control: SSI resistant / lantus Mobility: BR Code Status: Full Family Communication: Family communication per [facility] and Neurology  Disposition: ICU   Labs   CBC: Recent Labs  Lab 10/05/18 0626 10/06/18 0649 10/06/18 1827 10/08/18 0551  10/09/18 0547  WBC 8.6 8.4 9.3 12.9* 12.6*  NEUTROABS  --   --  7.0  --   --   HGB 9.0* 7.9* 7.2* 7.9* 7.9*  HCT 31.8* 27.0* 25.2* 27.5* 26.0*  MCV 106.7* 107.6* 106.8* 105.4* 104.0*  PLT 153 143* 187 249 234    Basic Metabolic Panel: Recent Labs  Lab 10/03/18 0630  10/04/18 1104  10/05/18 0626  10/06/18 0649  10/06/18 1827 10/07/18 0029 10/08/18 0551 10/08/18 1821 10/09/18 0547  NA 159*   < > 157*   < > 155*   < > 152*   < > 150* 148* 148* 147* 148*  K 4.2   < > 3.8  --  4.0  --  4.4  --   --   --  4.5  --  5.4*  CL 121*  --  123*  --  120*  --  117*  --   --   --  110  --  111  CO2 25  --  28  --  25  --  23  --   --   --  26  --  25  GLUCOSE 251*  --  230*  --  172*  --  145*  --   --   --  142*  --  94  BUN 36*  --  36*  --  28*  --  36*  --   --   --  54*  --  50*  CREATININE 2.22*  --  1.86*  --  1.62*  --  1.79*  --   --   --  2.33*  --  1.75*  CALCIUM 8.4*  --  7.9*  --  8.3*  --  8.3*  --   --   --  8.3*  --  8.5*  MG  --   --   --   --   --   --  3.0*  --   --   --   --   --   --   PHOS 5.1*  --   --   --   --   --   --   --   --   --   --   --   --    < > = values in this interval not displayed.   GFR: Estimated Creatinine Clearance: 64 mL/min (A) (by C-G formula based on SCr of 1.75 mg/dL (H)). Recent Labs  Lab 10/06/18 0649 10/06/18 1827 10/08/18 0551 10/09/18 0547  WBC 8.4 9.3 12.9* 12.6*    Liver Function Tests: No results for input(s): AST, ALT, ALKPHOS,  BILITOT, PROT, ALBUMIN in the last 168 hours. No results for input(s): LIPASE, AMYLASE in the last 168 hours. No results for input(s): AMMONIA in the last 168 hours.  ABG    Component Value Date/Time   PHART 7.441 10/03/2018 2014   PCO2ART 41.6 10/03/2018 2014   PO2ART 164.0 (H) 10/03/2018 2014   HCO3 28.2 (H) 10/03/2018 2014   TCO2 29 10/03/2018 2014   ACIDBASEDEF 2.0 09/30/2018 0624   O2SAT 100.0 10/03/2018 2014     Coagulation Profile: Recent Labs  Lab 10/04/18 1104 10/05/18 0626  10/06/18 0649 10/08/18 0551  INR 1.2 1.2 1.2 1.2    Cardiac Enzymes: No results for input(s): CKTOTAL, CKMB, CKMBINDEX, TROPONINI in the last 168 hours.  HbA1C: Hgb A1c MFr Bld  Date/Time Value Ref Range Status  09/28/2018 02:24 AM 8.9 (H) 4.8 - 5.6 % Final    Comment:    (NOTE) Pre diabetes:          5.7%-6.4% Diabetes:              >6.4% Glycemic control for   <7.0% adults with diabetes   03/11/2017 07:41 PM 8.0 (H) 4.8 - 5.6 % Final    Comment:    (NOTE) Pre diabetes:          5.7%-6.4% Diabetes:              >6.4% Glycemic control for   <7.0% adults with diabetes     CBG: Recent Labs  Lab 10/08/18 1600 10/08/18 2007 10/08/18 2345 10/09/18 0341 10/09/18 0759  GLUCAP 142* 125* 158* 95 131*    Critical Care Time:  40 minutes   Tessie Fass MSN, AGACNP-BC [place] [facility] [phone] If no answer, [phone] 10/09/2018, 10:03 AM

## 2018-10-09 NOTE — Progress Notes (Signed)
PT Cancellation Note  Patient Details Name: Johnathan Archer MRN: [id number] DOB: [birth year]   Cancelled Treatment:    Reason Eval/Treat Not Completed: PT screened, no needs identified, will sign off Pt with no changes-not following commands, posturing and not responsive on the ventilator. Will sign off for now. Please reconsult if anything changes or if PT becomes appropriate. Thanks.   [name] 10/09/2018, 9:59 AM [name], PT, DPT Acute Rehabilitation Services Pager [phone] Office [phone]

## 2018-10-09 NOTE — Progress Notes (Signed)
Daily Progress Note   Patient Name: Johnathan Archer       Date: 10/09/2018 DOB: 1970-10-13  Age: 48 y.o. MRN#: [id number] Attending Physician: Rosalin Hawking, MD Primary Care Physician: Sherilyn Cooter, NP Admit Date: 09/26/2018  Reason for Consultation/Follow-up: Establishing goals of care  Subjective: Unresponsive.  He has gag reflex and weak cough, he doesn't open eyes, doesn't follow commands, he has ongoing posturing movements, overall, appears neurologically the same as prior documented exams.   Length of Stay: 12  Current Medications: Scheduled Meds:  . amiodarone  200 mg Per Tube Daily  . aspirin  325 mg Per Tube Daily  . atorvastatin  20 mg Per Tube q1800  . bethanechol  10 mg Oral TID  . carvedilol  6.25 mg Per Tube BID  . chlorhexidine gluconate (MEDLINE KIT)  15 mL Mouth Rinse BID  . feeding supplement (GLUCERNA 1.5 CAL)  237 mL Per Tube 5 X Daily  . feeding supplement (PRO-STAT SUGAR FREE 64)  30 mL Per Tube TID  . free water  350 mL Per Tube Q4H  . hydrALAZINE  100 mg Per Tube BID  . insulin aspart  0-15 Units Subcutaneous Q4H  . insulin aspart  6 Units Subcutaneous 5 X Daily  . insulin glargine  25 Units Subcutaneous QHS  . isosorbide dinitrate  10 mg Per Tube TID  . mouth rinse  15 mL Mouth Rinse 10 times per day  . pantoprazole  40 mg Oral Daily   Or  . pantoprazole sodium  40 mg Per Tube Daily  . vitamin B-12  2,000 mcg Oral Daily    Continuous Infusions: . sodium chloride 10 mL/hr at 10/09/18 2000    PRN Meds: acetaminophen **OR** acetaminophen (TYLENOL) oral liquid 160 mg/5 mL **OR** acetaminophen, albuterol, bisacodyl, fentaNYL (SUBLIMAZE) injection, hydrALAZINE, labetalol, midazolam, sennosides  Physical Exam         Generalized edema is noted,  worse in UE than LE Abdomen some what distended He has ETT, he has OG tube, on bolus tube feeds Appears chronically ill Regular Vent assisted breaths regular  Vital Signs: BP 133/68 (BP Location: Left Arm)   Pulse 95   Temp (!) 102.7 F (39.3 C) (Axillary)   Resp (!) 22   Ht 6' (1.829 m)   Wt 102.7 kg  SpO2 100%   BMI 30.71 kg/m  SpO2: SpO2: 100 % O2 Device: O2 Device: Ventilator O2 Flow Rate: O2 Flow Rate (L/min): 2 L/min  Intake/output summary:   Intake/Output Summary (Last 24 hours) at 10/09/2018 2202 Last data filed at 10/09/2018 2000 Gross per 24 hour  Intake 1459.22 ml  Output 1625 ml  Net -165.78 ml   LBM: Last BM Date: 10/09/18 Baseline Weight: Weight: 105 kg Most recent weight: Weight: 102.7 kg       Palliative Assessment/Data:    Flowsheet Rows     Most Recent Value  Intake Tab  Referral Department  Neurology  Unit at Time of Referral  ICU  Palliative Care Primary Diagnosis  Neurology  Date Notified  10/07/18  Palliative Care Type  New Palliative care  Reason for referral  Clarify Goals of Care  Date of Admission  09/22/2018  Date first seen by Palliative Care  10/07/18  # of days Palliative referral response time  0 Day(s)  # of days IP prior to Palliative referral  10  Clinical Assessment  Psychosocial & Spiritual Assessment  Palliative Care Outcomes      Patient Active Problem List   Diagnosis Date Noted  . Palliative care by specialist   . Goals of care, counseling/discussion   . Acute on chronic respiratory failure with hypoxia and hypercapnia ([place]) 10/03/2018  . Aspiration into respiratory tract   . Endotracheal tube present   . HCAP (healthcare-associated pneumonia) 10/02/2018  . Acute hypernatremia 10/02/2018  . Essential hypertension 10/02/2018  . Ischemic stroke ([place]) 10/16/2018  . Middle cerebral artery embolism, right 10/12/2018  . Hypertensive heart disease with heart failure ([place]) 03/28/2016  . Normocytic anemia 03/15/2016   . Hyperthyroidism 03/15/2016  . Chronic combined systolic and diastolic CHF (congestive heart failure) ([place])   . History of noncompliance with medical treatment 03/06/2015  . Chest pain, atypical 03/06/2015  . Obesity (BMI 30-39.9) 01/21/2014  . Atrial fibrillation with rapid ventricular response ([place]) 07/05/2013  . Long term current use of anticoagulant therapy 01/14/2013  . Nonischemic cardiomyopathy ([place]) 12/27/2012  . Paroxysmal atrial fibrillation ([place]) 10/19/2011  . ED (erectile dysfunction)   . Type II diabetes mellitus, uncontrolled ([place])   . OSA (obstructive sleep apnea)     Palliative Care Assessment & Plan   Patient Profile:  48 year old gentleman with a past medical history significant for atrial fibrillation, chronic combined systolic and diastolic heart failure, diabetes, hypertension, nonischemic cardiomyopathy and obstructive sleep apnea.  Patient was at work when he was found by his coworkers unresponsive in the bathroom. Patient was brought into the hospital and treated as a code stroke with left-sided paralysis and with hypertensive emergency. Initial CT scan of the brain showed acute right MCA occlusion. Patient underwent intubation and mechanical ventilation for airway protection. Patient underwent thrombectomy and successful revascularization.  Patient has remained in the intensive care unit on the ventilator. He is suspected to have had a cardiac arrest event. Repeat imaging of the brain has shown worsening of his recent infarct. Brain imaging also shows increased edema and midline shift.  Initially, plans were in place for proceeding with tracheostomy and PEG tube placement and continued aggressive measures aimed towards stabilization/recovery.   However, patient's clinical course over the past 24-48 hours has only shown ongoing neurological decompensation. In addition, patient has had fluctuating blood pressures, has had a fever today. Patient has worsening  peripheral edema. Patient was found to be posturing and left upper extremity to stimulation earlier today.  In light of the patient's ongoing neurological decline, in spite of aggressive life prolonging measures, a palliative consultation has been requested for goals of care discussions.  Assessment:  vent dependent resp failure Neurological decline Recent arrest History of A fib.  History of CHF DM HTN  Recommendations/Plan: Call placed and discussed again with wife Mrs Mcphearson.  She reports discussing with Dr. Erlinda Hong this morning.  We discussed clinical course of her husband this admission as well as wishes moving forward in regard to care plan this hospitalization.  We discussed difference between a aggressive medical intervention path and a palliative, comfort focused care path.  She reports understanding that he is not going to get better and we discussed transition to comfort with one way extubation.  While his wife speaks of working toward this plan, her major concern regarding transition to comfort is related to restrictions on visitation due to coronavirus epidemic.  She asked me to clarify visitation policy if she elects one way extubation.  Discussed 4 visitor exemption for end of life situations.  Reports that she will need to discuss with family, but indicates in our conversation she is planning for extubation but needs time to determine visitation for family with restrictions in place.  Plan to call again in AM.  Questions and concerns addressed.   PMT will continue to support holistically.  Goals of Care and Additional Recommendations:  Limitations on Scope of Treatment: Full Scope Treatment  Code Status: Code Status History    Date Active Date Inactive Code Status Order ID Comments User Context   10/09/2018 1642 10/02/2018 1050 Full Code [id number]  Luanne Bras, MD Inpatient   10/03/2018 1302 09/30/2018 1642 Full Code [id number]  Aroor, Lanice Schwab, MD ED   07/05/2013 1749  07/07/2013 1635 Full Code [id number]  Jonetta Osgood, MD Inpatient       Prognosis:   guarded   Discharge Planning:  To Be Determined  Care plan was discussed with  Patient's RN, patient's wife on phone.   Thank you for allowing the Palliative Medicine Team to assist in the care of this patient.   Time In: 1615 Time Out: 1700 Total Time 45 Prolonged Time Billed  no       Greater than 50%  of this time was spent counseling and coordinating care related to the above assessment and plan.  Micheline Rough, MD  Please contact Palliative Medicine Team phone at [phone] for questions and concerns.

## 2018-10-09 NOTE — Progress Notes (Signed)
STROKE TEAM PROGRESS NOTE   INTERVAL HISTORY Patient neurologically seems getting worse, still unresponsive, but bilateral arms posturing on pain stimulation.  Creatinine improved some, however still has fever, leukocytosis.  Discussed with wife over the phone, given poor prognosis, wife would like to talk to palliative care regarding comfort care measures.  Vitals:   10/09/18 0700 10/09/18 0800 10/09/18 0805 10/09/18 0900  BP: (!) 143/76 (!) 146/87 (!) 146/87 (!) 151/80  Pulse: 91 92 92 89  Resp: 18 20 20 20   Temp:  (!) 101.2 F (38.4 C)    TempSrc:  Axillary    SpO2: 98% 98% 99% 99%  Weight:      Height:        CBC:  Recent Labs  Lab 10/06/18 1827 10/08/18 0551 10/09/18 0547  WBC 9.3 12.9* 12.6*  NEUTROABS 7.0  --   --   HGB 7.2* 7.9* 7.9*  HCT 25.2* 27.5* 26.0*  MCV 106.8* 105.4* 104.0*  PLT 187 249 234    Basic Metabolic Panel:  Recent Labs  Lab 10/03/18 0630  10/06/18 0649  10/08/18 0551 10/08/18 1821 10/09/18 0547  NA 159*   < > 152*   < > 148* 147* 148*  K 4.2   < > 4.4  --  4.5  --  5.4*  CL 121*   < > 117*  --  110  --  111  CO2 25   < > 23  --  26  --  25  GLUCOSE 251*   < > 145*  --  142*  --  94  BUN 36*   < > 36*  --  54*  --  50*  CREATININE 2.22*   < > 1.79*  --  2.33*  --  1.75*  CALCIUM 8.4*   < > 8.3*  --  8.3*  --  8.5*  MG  --   --  3.0*  --   --   --   --   PHOS 5.1*  --   --   --   --   --   --    < > = values in this interval not displayed.    IMAGING past 24h Dg Chest Port 1 View  Result Date: 10/09/2018 CLINICAL DATA:  Respiratory failure. EXAM: PORTABLE CHEST 1 VIEW COMPARISON:  Radiograph of October 06, 2018. FINDINGS: The heart size and mediastinal contours are within normal limits. Both lungs are clear. No pneumothorax or pleural effusion is noted. Endotracheal and feeding tubes are in good position. The visualized skeletal structures are unremarkable. IMPRESSION: Stable support apparatus. No acute cardiopulmonary abnormality seen.  Electronically Signed   By: Lupita Raider M.D.   On: 10/09/2018 08:10     PHYSICAL EXAM: General -obese middle-aged African-American male who is intubated but not on sedation. Right upper extremity bullous lesions with hand swelling and bandage.   Cardiovascular - Regular rate and rhythm, not in afib. Neurological Exam - Intubated on ventilation, not on sedation.  Comatose unresponsive. Eyes mid position, slight disconjugate eyes, pupil bilateral 2.5 mm, sluggish reaction to light. No blinking to visual threat bilaterally. Corneal reflex weak bilaterally, gag and cough present.  Doll's eye present.  Facial symmetry not able to test due to ET tube.  Tongue midline in mouth.  No spontaneous extremity movements.  Extensor posturing of the left and right upper extremities to sternal rub.  No movement in bilateral lower extremities. DTR diminished and  bilateral babinski negative. Sensation and coordination  not cooperative and gait not tested.   ASSESSMENT/PLAN [name] is a 48 y.o. 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 Early infarct R BG and insula.    CTA head & neck ELO R ICA, R M1, R M2. R A1 clot.   Cerebral angio R M1 TICI2b reperfusion  CT head hyperdensity R BG, contrast vs hmg. No mass effect  MRI  Large R MCA infarct, associated edema and mass effect w/o shift. prob small R cerebellar infarct. Small vessel disease.   MRA  Normal, previous R M1 open  2D Echo EF 60-65%. No source of embolus   Neurologic worsening 10/02/18, decreased LOC and sonorous breathing w/ aphasia likely due to inability to protect  airway and slight increase in cytotoxic edema.    CT head - R MCA R BG, perisylvian frontal and temporal lobes stable. Stable petechial hemorrhage. No new stroke. Increased edema and mass effect 10 mm R to L shift and effacement R lat ventricle (was 6 mm)  CTA head - no LVO, patent circ  CTA neck - no LVO or sign stenosis. Increased ground-glass opacification upper R lung  Further neurological worsening 10/06/2018 following temperature spike and blood pressure elevation.  Follow-up CT shows increased right frontal and parietal densities likely extension recent infarct  CT head - large R MCA infarct increased in size w/ extension into R parietal and frontal love. Stable mass effect w/ 10 mm shift and effacement R ventricle.   MRI 4/20 large subacute R MCA infarct w/ petechial HT. 10 mm L shift. Mult new small infarcts L cerebral, corpus callosum, medulla and R cerebellum  MRA - motion decraded w/o LVO  EEG no sz  LDL 73  HgbA1c 8.9  Heparin 5000 units sq tid or VTE prophylaxis Xarelto (rivaroxaban) daily prior to admission, now on aspirin 325 mg daily   Poor neuro prognosis.  Discussed with wife Johnathan Archer over the phone, given poor prognosis and unlikely meaningful recovery, wife is considering and would like to discuss with palliative care service  Palliative care on board - help appreciated  Cerebral edema  Induced hypernatremia  09/28/2018 pt with right ptosis, right pupil dilation with reserved reflex, right gaze sustained nystagmus -> 09/29/2018 bilateral proptosis, minimal extraocular movement bilaterally, pupil equal sluggish to light, bilateral facial weakness, dysphagia  MRI right temporal pole edema with cerebral peduncle compression  CT repeat 09/29/2018- Increasing regional mass effect and edema with associated 4 mm right-to-left shift.   MRI repeat increased MLS to 69mm, brainstem compression stable  MRI repeat 10/07/2018 shows increased size of right hemispheric infarct  with persistent cytotoxic edema and midline shift   Off 3% saline now  Na 153->150->148->148  free water for gradual lowering Na  Continue to monitor  Cardiac Arrest 10/02/18  While in CT 4/14 for decreased LOC and respiratory compromise  epinephine & compressions  stabilized  Acute on Chronic Respiratory Failure w/ hypoxia and hypercapnia  OSA  Initial intubation in ED for IR  Extubated 4/10  reintubated 4/11 d/t increased WOB  Extubated 4/14  reintubated 4/15 d/t diminished cough  and gag, snoring  CCM onboard  Trach procedure canceled  Atrial Fibrillation w/ RVR & VT  Home anticoagulation:  Xarelto (rivaroxaban) daily  . Home meds: amiodarone 200 . On ASA 325 . Resume home amiodarone 200 daily   Chronic combined systolic and diastolic CHF   Home meds: Coreg 37.5 bid, lasix 60 bid, hydralazine 50 bid, isosorbide 30, entresto bid, spironolactone 25  CXR 09/23/2018 lungs clear  Now on Coreg 6.25 bid, hydralazine 100 bid, isosorbide 10 tid  Fever and leukocytosis  Tmax 100.5 ->101.2  WBC 9.3->12.9->12.6  Blood culture no growth    urine culture no growth   resp culture normal respiratory flora  CXR  Improving airspace R lung base. Resolved right pleural effusion  Unasyn 4/12>>4/16   Now on Ancef 4/18>>  CCM on board  Dysphagia  Secondary to stroke  On bolus tube feeds via Cortrak  SLP following   PEG placement procedure canceled  Hypertension  Stable   Home meds:  Coreg 37.5 bid, lasix 60 bid, hydralazine 50 bid, isosorbide 30, entresto bid, spironolactone 25  BP goal < 180  Off cleviprex  On hydralazine 100 bid, isosorbide 10 tid, coreg 6.25 bid . Long-term BP goal normotensive  Hyperlipidemia  Home meds:  No statin  LDL 73, goal < 70  on lipitor 20  Diabetes type II uncontrolled  Home meds:  Metformin 500 bid, insulin degludec 8u  HgbA1c 8.9, goal < 7.0  Hyperglycemia improved  CCM on board  Lantus 25u q  hs  SSI novolog 0-15 q4h  Novolog 6u with TF    Hold off metformin due to elevated Cre  CBGs q4h  Other Stroke Risk Factors  Obesity, Body mass index is 30.71 kg/m., recommend weight loss, diet and exercise as appropriate   Obstructive sleep apnea  Nonischemic CM  Other Active Problems  AKI on CKD stage II Cr 1.79->2.33->1.75  Normocytic Anemia 12.5->10.2->9.0>7.9->7.2->7.9, MCV 105.5 - VB 12 and folate normal  Thrombocytopenia 241->151->153->249->234  Hyperkalemia, K 5.4  Dorsal RUE hand and forearm w/ bulbous lesion - phlebitis/cellulitis - likely d/t med infiltration during code blue - tender - WOC RN recommended xeroform to area and change qod - Hand surgeon Johnathan Morn([place]) does not feel c/w compartment syndrome, recommended elevation and warmth. No surgical intervention.  new blisters developed post consult - warm compresses and vascular checks to hand - ongoing conservative tx - to check back this week  Hospital day # 12  This patient is critically ill due to right MCA large infarct, A. fib RVR, diabetes, hyperglycemia, CHF and at significant risk of neurological worsening, death form recurrent stroke, hemorrhagic conversion, heart failure, seizure, sepsis, aspiration pneumonia. This patient's care requires constant monitoring of vital signs, hemodynamics, respiratory and cardiac monitoring, review of multiple databases, neurological assessment, discussion with family, other specialists and medical decision making of high complexity. I spent 35 minutes of neurocritical care time in the care of this patient. I had long discussion with pt wife Johnathan Musterlicia today over the phone, updated pt current condition, treatment plan and poor prognosis.   Given poor prognosis, wife would like to discuss with palliative care service regarding comfort care measures.  Marvel PlanJindong Tecla Mailloux, MD PhD Stroke Neurology 10/09/2018 9:21 AM    To contact Stroke Continuity provider, please refer to [email or web address]. After  hours, contact General Neurology

## 2018-10-10 DIAGNOSIS — T17908A Unspecified foreign body in respiratory tract, part unspecified causing other injury, initial encounter: Secondary | ICD-10-CM

## 2018-10-10 LAB — POCT I-STAT 7, (LYTES, BLD GAS, ICA,H+H)
Acid-Base Excess: 5 mmol/L — ABNORMAL HIGH (ref 0.0–2.0)
Bicarbonate: 29.9 mmol/L — ABNORMAL HIGH (ref 20.0–28.0)
Calcium, Ion: 1.18 mmol/L (ref 1.15–1.40)
HCT: 25 % — ABNORMAL LOW (ref 39.0–52.0)
Hemoglobin: 8.5 g/dL — ABNORMAL LOW (ref 13.0–17.0)
O2 Saturation: 97 %
Patient temperature: 35.7
Potassium: 5.2 mmol/L — ABNORMAL HIGH (ref 3.5–5.1)
Sodium: 145 mmol/L (ref 135–145)
TCO2: 31 mmol/L (ref 22–32)
pCO2 arterial: 41.3 mmHg (ref 32.0–48.0)
pH, Arterial: 7.461 — ABNORMAL HIGH (ref 7.350–7.450)
pO2, Arterial: 81 mmHg — ABNORMAL LOW (ref 83.0–108.0)

## 2018-10-10 LAB — GLUCOSE, CAPILLARY
Glucose-Capillary: 140 mg/dL — ABNORMAL HIGH (ref 70–99)
Glucose-Capillary: 169 mg/dL — ABNORMAL HIGH (ref 70–99)
Glucose-Capillary: 147 mg/dL — ABNORMAL HIGH (ref 70–99)
Glucose-Capillary: 174 mg/dL — ABNORMAL HIGH (ref 70–99)
Glucose-Capillary: 139 mg/dL — ABNORMAL HIGH (ref 70–99)
Glucose-Capillary: 144 mg/dL — ABNORMAL HIGH (ref 70–99)

## 2018-10-10 NOTE — Progress Notes (Signed)
STROKE TEAM PROGRESS NOTE   INTERVAL HISTORY Pt lying in bed, still not responsive. Still has fever. Palliative care on board and Dr. Neale Burly talked with wife and plan for one way extubation tomorrow.   Vitals:   10/10/18 0800 10/10/18 0900 10/10/18 1000 10/10/18 1015  BP: (!) 147/68 (!) 146/67 (!) 194/85 (!) 150/73  Pulse: 73 80 86   Resp: 18 (!) 22 (!) 24   Temp: 98.2 F (36.8 C) 98.2 F (36.8 C) 98.4 F (36.9 C)   TempSrc:      SpO2: 99% 99% 99%   Weight:      Height:        CBC:  Recent Labs  Lab 10/06/18 1827 10/08/18 0551 10/09/18 0547  WBC 9.3 12.9* 12.6*  NEUTROABS 7.0  --   --   HGB 7.2* 7.9* 7.9*  HCT 25.2* 27.5* 26.0*  MCV 106.8* 105.4* 104.0*  PLT 187 249 234    Basic Metabolic Panel:  Recent Labs  Lab 10/06/18 0649  10/08/18 0551 10/08/18 1821 10/09/18 0547  NA 152*   < > 148* 147* 148*  K 4.4  --  4.5  --  5.4*  CL 117*  --  110  --  111  CO2 23  --  26  --  25  GLUCOSE 145*  --  142*  --  94  BUN 36*  --  54*  --  50*  CREATININE 1.79*  --  2.33*  --  1.75*  CALCIUM 8.3*  --  8.3*  --  8.5*  MG 3.0*  --   --   --   --    < > = values in this interval not displayed.    IMAGING past 24h No results found.   PHYSICAL EXAM: General -obese middle-aged African-American male who is intubated but not on sedation. Right forearm and hand lesions with hand swelling and bandage.   Cardiovascular - Regular rate and rhythm, not in afib. Neurological Exam - Intubated on ventilation, not on sedation.  Comatose unresponsive. Eyes mid position, disconjugate eyes with spontaneous rolling, pupil bilateral 2.5 mm, reactive to light. No blinking to visual threat bilaterally. Corneal reflex weak bilaterally, gag and cough present. Facial symmetry not able to test due to ET tube.  Tongue midline in mouth.  No spontaneous extremity movements.  Extensor posturing of the left > right upper extremities to sternal rub.  No movement in bilateral lower extremities. DTR  diminished and  bilateral babinski negative. Sensation and coordination not cooperative and gait not tested.   ASSESSMENT/PLAN [name] is a 48 y.o. 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 Early infarct R BG and insula.    CTA head & neck ELO R ICA, R M1, R M2. R A1 clot.   Cerebral angio R M1 TICI2b reperfusion  CT head hyperdensity R BG, contrast vs hmg. No mass effect  MRI  Large R MCA infarct, associated edema and mass effect w/o shift. prob small R cerebellar infarct. Small vessel disease.   MRA  Normal, previous R M1 open  2D Echo  EF 60-65%. No source of embolus   Neurologic worsening 10/02/18, decreased LOC and sonorous breathing w/ aphasia likely due to inability to protect airway and slight increase in cytotoxic edema.    CT head - R MCA R BG, perisylvian frontal and temporal lobes stable. Stable petechial hemorrhage. No new stroke. Increased edema and mass effect 10 mm R to L shift and effacement R lat ventricle (was 6 mm)  CTA head - no LVO, patent circ  CTA neck - no LVO or sign stenosis. Increased ground-glass opacification upper R lung  Further neurological worsening 10/06/2018 following temperature spike and blood pressure elevation.  Follow-up CT shows increased right frontal and parietal densities likely extension recent infarct  CT head - large R MCA infarct increased in size w/ extension into R parietal and frontal love. Stable mass effect w/ 10 mm shift and effacement R ventricle.   MRI 4/20 large subacute R MCA infarct w/ petechial HT. 10 mm L shift. Mult new small infarcts L cerebral,  corpus callosum, medulla and R cerebellum  MRA - motion decraded w/o LVO  EEG no sz  LDL 73  HgbA1c 8.9  Heparin 5000 units sq tid or VTE prophylaxis Xarelto (rivaroxaban) daily prior to admission, now on aspirin 325 mg daily   Poor neuro prognosis. Wife Johnathan Archer spoke with palliative care service - is considering one-way extubation tomorrow.   Palliative care on board - help appreciated  Cerebral edema  Induced hypernatremia  09/28/2018 pt with right ptosis, right pupil dilation with reserved reflex, right gaze sustained nystagmus -> 09/29/2018 bilateral proptosis, minimal extraocular movement bilaterally, pupil equal sluggish to light, bilateral facial weakness, dysphagia  MRI right temporal pole edema with cerebral peduncle compression  CT repeat 09/29/2018- Increasing regional mass effect and edema with associated 4 mm right-to-left shift.   MRI repeat increased MLS to 6mm, brainstem compression stable  MRI repeat 10/07/2018 shows increased size of right hemispheric infarct with persistent cytotoxic edema and midline shift   Off 3% saline now  Na 153->150->148->148  free water for gradual lowering Na  Continue to monitor  Cardiac Arrest 10/02/18  While in CT 4/14 for decreased LOC and respiratory compromise  epinephine & compressions  stabilized  Acute on Chronic Respiratory Failure w/ hypoxia and hypercapnia  OSA  Initial intubation in ED for IR  Extubated 4/10  reintubated 4/11 d/t increased WOB  Extubated 4/14  reintubated 4/15 d/t diminished cough and gag, snoring  CCM onboard  Trach procedure canceled  Atrial Fibrillation w/ RVR & VT  Home anticoagulation:  Xarelto (rivaroxaban) daily  . Home meds: amiodarone 200 . On ASA 325 - not an AC candidate given large stroke . On home amiodarone 200 daily   Chronic combined systolic and diastolic CHF   Home meds: Coreg 37.5 bid, lasix 60 bid, hydralazine 50 bid, isosorbide 30, entresto bid,  spironolactone 25  CXR 10/10/2018 lungs clear  Now on Coreg 6.25 bid, hydralazine 100 bid, isosorbide 10 tid  Fever and leukocytosis  Tmax 103.2  WBC 9.3->12.9->12.6  Blood culture no growth    urine culture no growth   resp culture normal respiratory flora  CXR  Improving airspace R lung base. Resolved right pleural effusion  abx course completed, off abx  CCM on board  Dysphagia  Secondary to stroke  On bolus tube feeds via Cortrak  SLP following   PEG placement procedure canceled  Hypertension  Stable   Home meds:  Coreg 37.5 bid, lasix 60 bid, hydralazine 50  bid, isosorbide 30, entresto bid, spironolactone 25  BP goal < 180  Off cleviprex  On hydralazine 100 bid, isosorbide 10 tid, coreg 6.25 bid . Long-term BP goal normotensive  Hyperlipidemia  Home meds:  No statin  LDL 73, goal < 70  on lipitor 20  Diabetes type II uncontrolled  Home meds:  Metformin 500 bid, insulin degludec 8u  HgbA1c 8.9, goal < 7.0  Hyperglycemia improved  CCM on board  Lantus 25u q hs  SSI novolog 0-15 q4h  Novolog 6u with TF    Hold off metformin due to elevated Cre  CBGs q4h  Other Stroke Risk Factors  Obesity, Body mass index is 30.71 kg/m., recommend weight loss, diet and exercise as appropriate   Obstructive sleep apnea  Nonischemic CM  Other Active Problems  AKI on CKD stage II Cr 1.79->2.33->1.75  Normocytic Anemia 12.5->10.2->9.0>7.9->7.2->7.9, MCV 105.5 - VB 12 and folate normal  Thrombocytopenia 241->151->153->249->234  Hyperkalemia, K 5.4  Dorsal RUE hand and forearm w/ bulbous lesion - phlebitis/cellulitis - likely d/t med infiltration during code blue - tender - WOC RN recommended xeroform to area and change qod - Hand surgeon Janee Morn) does not feel c/w compartment syndrome, recommended elevation and warmth. No surgical intervention.  new blisters developed post consult - warm compresses and vascular checks to hand - ongoing  conservative tx - to check back this week  Hospital day # 13  Palliative care discussed with wife and plan for one-way extubation tomorrow.  Marvel Plan, MD PhD Stroke Neurology 10/10/2018 10:21 AM    To contact Stroke Continuity provider, please refer to [email or web address]. After hours, contact General Neurology

## 2018-10-10 NOTE — Progress Notes (Signed)
Daily Progress Note   Patient Name: Johnathan Archer       Date: 10/10/2018 DOB: 1971-02-10  Age: 48 y.o. MRN#: [id number] Attending Physician: Rosalin Hawking, MD Primary Care Physician: Sherilyn Cooter, NP Admit Date: 10/10/2018  Reason for Consultation/Follow-up: Establishing goals of care  Subjective: Unresponsive.  He has gag reflex and weak cough, he doesn't open eyes, doesn't follow commands, he has ongoing posturing movements, overall, appears neurologically the same as prior documented exams.   Length of Stay: 13  Current Medications: Scheduled Meds:   amiodarone  200 mg Per Tube Daily   aspirin  325 mg Per Tube Daily   atorvastatin  20 mg Per Tube q1800   bethanechol  10 mg Oral TID   carvedilol  6.25 mg Per Tube BID   chlorhexidine gluconate (MEDLINE KIT)  15 mL Mouth Rinse BID   feeding supplement (GLUCERNA 1.5 CAL)  237 mL Per Tube 5 X Daily   feeding supplement (PRO-STAT SUGAR FREE 64)  30 mL Per Tube TID   free water  350 mL Per Tube Q4H   hydrALAZINE  100 mg Per Tube BID   insulin aspart  0-15 Units Subcutaneous Q4H   insulin aspart  6 Units Subcutaneous 5 X Daily   insulin glargine  25 Units Subcutaneous QHS   isosorbide dinitrate  10 mg Per Tube TID   mouth rinse  15 mL Mouth Rinse 10 times per day   pantoprazole  40 mg Oral Daily   Or   pantoprazole sodium  40 mg Per Tube Daily   vitamin B-12  2,000 mcg Oral Daily    Continuous Infusions:  sodium chloride 10 mL/hr at 10/10/18 1000    PRN Meds: acetaminophen **OR** acetaminophen (TYLENOL) oral liquid 160 mg/5 mL **OR** acetaminophen, albuterol, bisacodyl, fentaNYL (SUBLIMAZE) injection, hydrALAZINE, labetalol, midazolam, sennosides  Physical Exam Abdominal:     Palpations: Mass:               Generalized edema is noted, worse in UE than LE Abdomen some what distended He has ETT, he has OG tube, on bolus tube feeds Appears chronically ill Regular Vent assisted breaths regular  Vital Signs: BP (!) 150/73    Pulse 86    Temp 98.4 F (36.9 C)    Resp (!) 24  Ht 6' (1.829 m)    Wt 102.7 kg    SpO2 99%    BMI 30.71 kg/m  SpO2: SpO2: 99 % O2 Device: O2 Device: Ventilator O2 Flow Rate: O2 Flow Rate (L/min): 2 L/min  Intake/output summary:   Intake/Output Summary (Last 24 hours) at 10/10/2018 1053 Last data filed at 10/10/2018 1000 Gross per 24 hour  Intake 2586.05 ml  Output 2135 ml  Net 451.05 ml   LBM: Last BM Date: 10/09/18 Baseline Weight: Weight: 105 kg Most recent weight: Weight: 102.7 kg       Palliative Assessment/Data:    Flowsheet Rows     Most Recent Value  Intake Tab  Referral Department  Neurology  Unit at Time of Referral  ICU  Palliative Care Primary Diagnosis  Neurology  Date Notified  10/07/18  Palliative Care Type  New Palliative care  Reason for referral  Clarify Goals of Care  Date of Admission  10/12/2018  Date first seen by Palliative Care  10/07/18  # of days Palliative referral response time  0 Day(s)  # of days IP prior to Palliative referral  10  Clinical Assessment  Psychosocial & Spiritual Assessment  Palliative Care Outcomes      Patient Active Problem List   Diagnosis Date Noted   Palliative care by specialist    Goals of care, counseling/discussion    Acute on chronic respiratory failure with hypoxia and hypercapnia ([place]) 10/03/2018   Aspiration into respiratory tract    Endotracheal tube present    HCAP (healthcare-associated pneumonia) 10/02/2018   Acute hypernatremia 10/02/2018   Essential hypertension 10/02/2018   Ischemic stroke ([place]) 10/03/2018   Middle cerebral artery embolism, right 09/19/2018   Hypertensive heart disease with heart failure ([place]) 03/28/2016   Normocytic anemia 03/15/2016    Hyperthyroidism 03/15/2016   Chronic combined systolic and diastolic CHF (congestive heart failure) ([place])    History of noncompliance with medical treatment 03/06/2015   Chest pain, atypical 03/06/2015   Obesity (BMI 30-39.9) 01/21/2014   Atrial fibrillation with rapid ventricular response ([place]) 07/05/2013   Long term current use of anticoagulant therapy 01/14/2013   Nonischemic cardiomyopathy ([place]) 12/27/2012   Paroxysmal atrial fibrillation ([place]) 10/19/2011   ED (erectile dysfunction)    Type II diabetes mellitus, uncontrolled (HCC)    OSA (obstructive sleep apnea)     Palliative Care Assessment & Plan   Patient Profile:  48 year old gentleman with a past medical history significant for atrial fibrillation, chronic combined systolic and diastolic heart failure, diabetes, hypertension, nonischemic cardiomyopathy and obstructive sleep apnea.  Patient was at work when he was found by his coworkers unresponsive in the bathroom. Patient was brought into the hospital and treated as a code stroke with left-sided paralysis and with hypertensive emergency. Initial CT scan of the brain showed acute right MCA occlusion. Patient underwent intubation and mechanical ventilation for airway protection. Patient underwent thrombectomy and successful revascularization.  Patient has remained in the intensive care unit on the ventilator. He is suspected to have had a cardiac arrest event. Repeat imaging of the brain has shown worsening of his recent infarct. Brain imaging also shows increased edema and midline shift.  Initially, plans were in place for proceeding with tracheostomy and PEG tube placement and continued aggressive measures aimed towards stabilization/recovery.   However, patient's clinical course over the past 24-48 hours has only shown ongoing neurological decompensation. In addition, patient has had fluctuating blood pressures, has had a fever today. Patient has worsening  peripheral edema.  Patient was found to be posturing and left upper extremity to stimulation earlier today.  In light of the patient's ongoing neurological decline, in spite of aggressive life prolonging measures, a palliative consultation has been requested for goals of care discussions.  Assessment:  vent dependent resp failure Neurological decline Recent arrest History of A fib.  History of CHF DM HTN  Recommendations/Plan: Call placed and discussed again with wife Mrs Hadden.  We discussed clinical course of her husband this admission as well as wishes moving forward in regard to care plan this hospitalization.  She has been discussing with family, and she and his family understand the severity of his condition and she would like to plan to liberate him from the vent tomorrow.  I discussed with with his wife regarding heroic interventions in the event of cardiac arrest and she agree this would not be in his best interest in light of his current condition and plan to extubate tomorrow. She is in agreement with changing CODE STATUS to DO NOT RESUSCITATE.  - Continue current care for now.   - Plan for one way extubation tomorrow.  Family to arrive at [place]. - DNR in the event of cardiac arrest  Questions and concerns addressed.   PMT will continue to support holistically.  Code Status:  DNR  Prognosis:   guarded   Discharge Planning:  To Be Determined  Care plan was discussed with  Patient's RN, patient's wife on phone.   Thank you for allowing the Palliative Medicine Team to assist in the care of this patient.   Time In: 0900 Time Out: 0945 Total Time 45 Prolonged Time Billed  no       Greater than 50%  of this time was spent counseling and coordinating care related to the above assessment and plan.  Micheline Rough, MD  Please contact Palliative Medicine Team phone at [phone] for questions and concerns.

## 2018-10-10 NOTE — Progress Notes (Signed)
Nutrition Follow-up RD working remotely.  DOCUMENTATION CODES:   Not applicable  INTERVENTION:   Continue:  Glucerna 1.5 five times daily via Cortrak  30 ml Prostat TID  Provides: 2080 kcal (96% of needs), 143 grams protein, and 900 ml free water.  Total free water: 3000 ml   NUTRITION DIAGNOSIS:   Inadequate oral intake related to inability to eat as evidenced by NPO status. Ongoing  GOAL:   Patient will meet greater than or equal to 90% of their needs Met.   MONITOR:   TF tolerance, Diet advancement  REASON FOR ASSESSMENT:   Consult Enteral/tube feeding initiation and management  ASSESSMENT:   Pt with PMH of Afib on xarelto, chronic systolic/dystolic HF, DM, HTN, MR, anemia, obesity admitted with R MCA s/p IR for revascularization.    4/10 Cortrak placed 4/11 intubated 4/13 extubated  4/15 pt with aspiration event (secretions), arrested in CT, now re-intubated 4/22 pt with worsening neuro with increased swelling. Palliative and CCM working with family for one-way extubation with plans for comfort +fevers   Patient is currently intubated on ventilator support MV: 10.1 L/min Temp (24hrs), Avg:99.6 F (37.6 C), Min:97.5 F (36.4 C), Max:103.2 F (39.6 C)   Medications reviewed and include:  350 ml free water every 4 hours = 2100 ml  Moderate sliding scale  6 units novolog five times per day 25 units Lantus HS Vitamin B 12  Labs reviewed: Na 148 (H) Lab Results  Component Value Date   HGBA1C 8.9 (H) 09/28/2018  CBGs: 169-140 MAP 94  NUTRITION - FOCUSED PHYSICAL EXAM:  Deferred   Diet Order:   Diet Order            Diet NPO time specified  Diet effective now              EDUCATION NEEDS:   Not appropriate for education at this time  Skin:  Skin Assessment: (R groin incision)  Last BM:  4/20 small  Height:   Ht Readings from Last 1 Encounters:  09/30/18 6' (1.829 m)    Weight:   Wt Readings from Last 1 Encounters:   10/09/18 102.7 kg    Ideal Body Weight:     BMI:  Body mass index is 30.71 kg/m.  Estimated Nutritional Needs:   Kcal:  2186  Protein:  120-140 grams  Fluid:  >1.7 L/day  Heather Pitts RD, LDN, CNSC [phone] Pager [phone] After Hours Pager  

## 2018-10-10 NOTE — Progress Notes (Signed)
NAME:  Johnathan Archer, MRN:  [id number], DOB:  [birth year], LOS: 13 ADMISSION DATE:  09/23/2018, CONSULTATION DATE:  09/25/2018 REFERRING MD:  Dr. Loni Beckwith, CHIEF COMPLAINT:  CVA  Brief History   2 yoM, LSW 1000 am 4/9, presented with left sided paralysis and right gaze found to have acute right MCA M1 occlusion.  Difficult intubation for airway protection.  Taken for EVR with successful revascularization.    History of present illness   HPI obtained from medical chart review as patient is intubated/ sedated   48 year old male with history of Afib on xarelto, chronic systolic / dystolic HF, DMT2, HTN, MR, NICM, anemia, obesity, and OSA, last seen well around 1000 by coworkers found wedged between sink and bathroom stall at work.  Presented as code stroke with left sided paralysis and right gaze.  Hypertensive at 221/ 145.  Found on CT to have acute right MCA M1 occlusion.  Intubated 4/9  for airway protection, was a difficult intubation.  Taken to IR with successful revascularization of right MCA M1 occlusion achieving TICI 2b revascularization.  Returns to ICU, PCCM to consult for ventilator management.   Called back am 4/15 due to arrest in CT with ? Aspiration   Past Medical History  Afib on xarelto, chronic systolic / dystolic HF, DMT2, HTN, MR, NICM, anemia, obesity, OSA, ED   Significant Hospital Events   4/9 Admitted / EVR 4/10 Extubated, started on 3% saline 4/11 Remains on Cleviprex, Failed swallowing evaluation 4/10, left n.p.o. with gastric tube in place, fevers 4/12 Intubated early am for airway compromise/ repeat MRI, rising Na, 3% held, pan-cultured and started on abx  4/13  Re-extubated  4/15 reintubated p ? Asp event in Ct > for peg/trach 4/22 neurological deterioration, tolerating pressure support, ongoing discussion regarding goals of care  Consults:  Neurology Palliative care Procedures:  4/9 ETT >>4/10; 4/12 >> 4/13  4/9 R radial aline >> 4/9 cerebral  angiogram  Re et  4/14  Significant Diagnostic Tests:  4/9 CTH >> 1. Hyperdense right MCA compatible with acute embolus. Early infarct in the right basal ganglia and insula 2. 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 Poor flow in right MCA branches. Small amount of clot in right A1 segment. Findings compatible with acute embolus. No significant atherosclerotic disease in the neck or head.  4/9 CTH post intervention >> Hyperdensity within the right basal ganglia may indicate contrast staining or petechial hemorrhage. No significant mass effect.  4/10 MRI brain >> evolving right MCA M1 stroke, culprit vessel is patent.  Some petechial changes without overt hemorrhagic conversion, no shift  Echocardiogram 4/10 >> normal LV function 60-65%, mildly increased LV wall thickness without diastolic dysfunction, normal RV function, no evidence of shunt by bubble study,  4/12 MRI Brain >> continued interval evolution large right MCA CVA, scattered petechial hemorrhage slightly increased, increased gyral swelling with edema and regional mass-effect.  Right to left shift increased from 4 mm to 6 mm.  No hydrocephalus.  A few new subcentimeter acute left cerebral infarcts  4/19 MRA brain IMPRESSION: 1. Large subacute right MCA infarct, increased from 09/30/2018 MRI but similar in appearance to yesterday's CT. Associated petechial hemorrhage without malignant hemorrhagic transformation. 2. 10 mm leftward midline shift, unchanged from yesterday's CT. 3. Multiple new small acute infarcts in the left cerebral hemisphere, corpus  callosum, medulla, and right cerebellum. 4. Motion degraded head MRA without large vessel occlusion. Questionable stenoses as above which may be artifactual.  Micro Data:  4/9 MRSA PCR >> neg  4/12 BC x 2 >> no growth 4/12 UC >> neg  4/12 trach  asp >> mod gpc, few gnr >>> nl flora  4/15 Trach asp >>> mod wbc, few gpc >> 4/15 Resp panel by PCR >  Neg  4/15 Covid-19  Neg  4/21 blood culture x2-negative to date  Antimicrobials:  4/9 ancef preop Unaysn 4/12-4/16  Interim history/subjective:  Febrile TMax 103.2   tolerating pressure support ventilation without distress  Objective   Blood pressure (!) 147/68, pulse 73, temperature 98.2 F (36.8 C), resp. rate 18, height 6' (1.829 m), weight 102.7 kg, SpO2 99 %.  RA    Vent Mode: PRVC FiO2 (%):  [30 %] 30 % Set Rate:  [18 bmp] 18 bmp Vt Set:  [620 mL] 620 mL PEEP:  [5 cmH20] 5 cmH20 Pressure Support:  [12 cmH20] 12 cmH20 Plateau Pressure:  [18 cmH20-23 cmH20] 18 cmH20   Intake/Output Summary (Last 24 hours) at 10/10/2018 7048 Last data filed at 10/10/2018 0800 Gross per 24 hour  Intake 2866.05 ml  Output 1935 ml  Net 931.05 ml   Filed Weights   10/07/18 0417 10/08/18 0500 10/09/18 0500  Weight: 102.1 kg 102.4 kg 102.7 kg   Physical Exam: Critically ill, orally intubated, not following commands S1-S2 appreciated Clear breath sounds bilaterally Abdomen is soft, bowel sounds appreciated No edema, no clubbing Not following commands, localizes to pain  Resolved Hospital Problem list    Assessment & Plan:   Right MCA M1 occlusion status post EVAR with successful revascularization Deteriorating neurological status, MRI noted Palliative care consult noted CVA management per neurology Continue current measures Ongoing discussions with family regarding goals of care  Acute respiratory failure, requiring reintubation Inability to protect airway Aspiration pneumonia Completed Unasyn Continue weaning as tolerated Mental status precludes extubation Continue pulmonary hygiene ABG ordered  Spoke with representative from [place] donor [facility]  With ongoing discussions, patient's best interest may be one-way extubation as neurological recovery is unlikely  Acute  kidney injury Urine output adequate Creatinine improving Trend BMP Avoid nephrotoxic's  Hypernatremia Continue to trend electrolytes Continue free water  Hyperkalemia Trend electrolytes  RUE edema  Phlebitis? edema and skin sloughing S/p course of abx for ?cellulitis  Plan: Per ortho/hand, continue conservative care  Keep elevated Warm compresses Vascular checks to hand Wound care   Atrial fibrillation Xarelto on hold at present Continue amiodarone Continue telemetry monitoring  History of chronic systolic and diastolic heart failure with last ejection fraction of 55% Hold Entresto for acute kidney injury  Hypertension Continue Coreg, Isordil, hydralazine PRN labetalol  DM2  Home medications on hold P:  Monitor blood sugars SSI Lantus 25 units daily  Fever May be central fever Follow cultures Continue to hold antibiotics at present  Ongoing goals of care discussions Await outcome of further discussions Appreciate palliative care medicines assistance  Prognosis currently is grim  Best practice:  Diet: Enteral nutrition Pain/Anxiety/Delirium protocol (if indicated): PRN fentanyl, Versed VAP protocol (if indicated): yes   DVT prophylaxis: SCDs, subcu heparin GI prophylaxis: PPI Glucose control: SSI/Lantus Mobility: BR Code Status: Full Family Communication: Family communication per [facility] and Neurology  Disposition: ICU   Labs   CBC: Recent Labs  Lab 10/05/18 0626 10/06/18 0649 10/06/18 1827 10/08/18 0551 10/09/18 0547  WBC 8.6 8.4 9.3 12.9* 12.6*  NEUTROABS  --   --  7.0  --   --   HGB 9.0* 7.9* 7.2* 7.9* 7.9*  HCT 31.8* 27.0* 25.2* 27.5* 26.0*  MCV 106.7* 107.6* 106.8* 105.4* 104.0*  PLT 153 143* 187 249 234    Basic Metabolic Panel: Recent Labs  Lab 10/04/18 1104  10/05/18 0626  10/06/18 0649  10/06/18 1827 10/07/18 0029 10/08/18 0551 10/08/18 1821 10/09/18 0547  NA 157*   < > 155*   < > 152*   < > 150* 148* 148* 147* 148*  K  3.8  --  4.0  --  4.4  --   --   --  4.5  --  5.4*  CL 123*  --  120*  --  117*  --   --   --  110  --  111  CO2 28  --  25  --  23  --   --   --  26  --  25  GLUCOSE 230*  --  172*  --  145*  --   --   --  142*  --  94  BUN 36*  --  28*  --  36*  --   --   --  54*  --  50*  CREATININE 1.86*  --  1.62*  --  1.79*  --   --   --  2.33*  --  1.75*  CALCIUM 7.9*  --  8.3*  --  8.3*  --   --   --  8.3*  --  8.5*  MG  --   --   --   --  3.0*  --   --   --   --   --   --    < > = values in this interval not displayed.   GFR: Estimated Creatinine Clearance: 64 mL/min (A) (by C-G formula based on SCr of 1.75 mg/dL (H)). Recent Labs  Lab 10/06/18 0649 10/06/18 1827 10/08/18 0551 10/09/18 0547  WBC 8.4 9.3 12.9* 12.6*    Liver Function Tests: No results for input(s): AST, ALT, ALKPHOS, BILITOT, PROT, ALBUMIN in the last 168 hours. No results for input(s): LIPASE, AMYLASE in the last 168 hours. No results for input(s): AMMONIA in the last 168 hours.  ABG    Component Value Date/Time   PHART 7.441 10/03/2018 2014   PCO2ART 41.6 10/03/2018 2014   PO2ART 164.0 (H) 10/03/2018 2014   HCO3 28.2 (H) 10/03/2018 2014   TCO2 29 10/03/2018 2014   ACIDBASEDEF 2.0 09/30/2018 0624   O2SAT 100.0 10/03/2018 2014     Coagulation Profile: Recent Labs  Lab 10/04/18 1104 10/05/18 0626 10/06/18 0649 10/08/18 0551  INR 1.2 1.2 1.2 1.2    Cardiac Enzymes: No results for input(s): CKTOTAL, CKMB, CKMBINDEX, TROPONINI in the last 168 hours.  HbA1C: Hgb A1c MFr Bld  Date/Time Value Ref Range Status  09/28/2018 02:24 AM 8.9 (H) 4.8 - 5.6 % Final    Comment:    (NOTE) Pre diabetes:          5.7%-6.4% Diabetes:              >6.4% Glycemic control for   <7.0% adults with diabetes   03/11/2017 07:41 PM 8.0 (H) 4.8 - 5.6 % Final    Comment:    (NOTE) Pre diabetes:          5.7%-6.4% Diabetes:              >6.4%  Glycemic control for   <7.0% adults with diabetes     CBG: Recent Labs  Lab  10/09/18 1539 10/09/18 2012 10/09/18 2353 10/10/18 0347 10/10/18 0728  GLUCAP 152* 144* 153* 140* 169*    Critical care time spent evaluating patient, reviewing records, formulating plan of care is 30 minutes  Virl DiamondAdewale Elvis Boot, MD

## 2018-10-11 LAB — GLUCOSE, CAPILLARY
Glucose-Capillary: 130 mg/dL — ABNORMAL HIGH (ref 70–99)
Glucose-Capillary: 141 mg/dL — ABNORMAL HIGH (ref 70–99)

## 2018-10-11 MED ORDER — GLYCOPYRROLATE 0.2 MG/ML IJ SOLN: 0.4000 mg | INTRAMUSCULAR | Status: DC | PRN

## 2018-10-11 MED ORDER — DIPHENHYDRAMINE HCL 50 MG/ML IJ SOLN: 25.0000 mg | INTRAMUSCULAR | Status: DC | PRN

## 2018-10-11 MED ORDER — MIDAZOLAM HCL 2 MG/2ML IJ SOLN
2.0000 mg | INTRAMUSCULAR | Status: DC | PRN
  Administered 2018-10-11: 2 mg via INTRAVENOUS
  Filled 2018-10-11: qty 2

## 2018-10-11 MED ORDER — HYDROMORPHONE BOLUS VIA INFUSION
1.0000 mg | INTRAVENOUS | Status: DC | PRN
  Filled 2018-10-11: qty 1

## 2018-10-11 MED ORDER — POLYVINYL ALCOHOL 1.4 % OP SOLN
1.0000 [drp] | Freq: Four times a day (QID) | OPHTHALMIC | Status: DC | PRN
  Filled 2018-10-11: qty 15

## 2018-10-11 MED ORDER — DEXTROSE 5 % IV SOLN: INTRAVENOUS | Status: DC

## 2018-10-11 MED ORDER — SODIUM CHLORIDE 0.9 % IV SOLN
0.3000 mg/h | INTRAVENOUS | Status: DC
  Administered 2018-10-11: 12:00:00 0.3 mg/h via INTRAVENOUS
  Filled 2018-10-11: qty 2.5

## 2018-10-14 LAB — CULTURE, BLOOD (ROUTINE X 2)
Culture: NO GROWTH
Culture: NO GROWTH
Special Requests: ADEQUATE
Special Requests: ADEQUATE

## 2018-10-19 NOTE — Procedures (Signed)
Extubation Procedure Note  Patient Details:   Name: Johnathan Archer DOB: Nov 01, 1970 MRN: [id number]   Airway Documentation:    Vent end date: 10/07/2018 Vent end time: 1320   Evaluation  O2 sats: transiently fell during during procedure Complications: No apparent complications Patient did not tolerate procedure well. Bilateral Breath Sounds: Diminished, Rhonchi   No.  Pt extubated to comfort care per physician order and family request. Pt was suctioned orally and via ETT and tolerated fairly well. Pt extubated to 4L Nasal Cannula for comfort. The patients family then returned and remained at bedside.   Derinda Late 10/14/2018, 1:31 PM

## 2018-10-19 NOTE — Progress Notes (Signed)
NAME:  Johnathan Archer, MRN:  [id number], DOB:  03/12/1971, LOS: 14 ADMISSION DATE:  10-04-18, CONSULTATION DATE:  2018/10/04 REFERRING MD:  Dr. Loni Beckwith, CHIEF COMPLAINT:  CVA  Brief History   48 yoM, LSW 1000 am 4/9, presented with left sided paralysis and right gaze found to have acute right MCA M1 occlusion.  Difficult intubation for airway protection.  Taken for EVR with successful revascularization.    History of present illness   HPI obtained from medical chart review as patient is intubated/ sedated   48 year old male with history of Afib on xarelto, chronic systolic / dystolic HF, DMT2, HTN, MR, NICM, anemia, obesity, and OSA, last seen well around 1000 by coworkers found wedged between sink and bathroom stall at work.  Presented as code stroke with left sided paralysis and right gaze.  Hypertensive at 221/ 145.  Found on CT to have acute right MCA M1 occlusion.  Intubated 4/9  for airway protection, was a difficult intubation.  Taken to IR with successful revascularization of right MCA M1 occlusion achieving TICI 2b revascularization.  Returns to ICU, PCCM to consult for ventilator management.   Called back am 4/15 due to arrest in CT with ? Aspiration   Past Medical History  Afib on xarelto, chronic systolic / dystolic HF, DMT2, HTN, MR, NICM, anemia, obesity, OSA, ED   Significant Hospital Events   4/9 Admitted / EVR 4/10 Extubated, started on 3% saline 4/11 Remains on Cleviprex, Failed swallowing evaluation 4/10, left n.p.o. with gastric tube in place, fevers 4/12 Intubated early am for airway compromise/ repeat MRI, rising Na, 3% held, pan-cultured and started on abx  4/13  Re-extubated  4/15 reintubated p ? Asp event in Ct > for peg/trach 4/22 neurological deterioration, tolerating pressure support, ongoing discussion regarding goals of care  Consults:  Neurology Palliative care  Procedures:  4/9 ETT >>4/10; 4/12 >> 4/13  4/9 R radial aline >> 4/9 cerebral  angiogram  Re et  4/14  Significant Diagnostic Tests:  4/9 CTH >> 1. Hyperdense right MCA compatible with acute embolus. Early infarct in the right basal ganglia and insula 2. 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 Poor flow in right MCA branches. Small amount of clot in right A1 segment. Findings compatible with acute embolus. No significant atherosclerotic disease in the neck or head.  4/9 CTH post intervention >> Hyperdensity within the right basal ganglia may indicate contrast staining or petechial hemorrhage. No significant mass effect.  4/10 MRI brain >> evolving right MCA M1 stroke, culprit vessel is patent.  Some petechial changes without overt hemorrhagic conversion, no shift  Echocardiogram 4/10 >> normal LV function 60-65%, mildly increased LV wall thickness without diastolic dysfunction, normal RV function, no evidence of shunt by bubble study,  4/12 MRI Brain >> continued interval evolution large right MCA CVA, scattered petechial hemorrhage slightly increased, increased gyral swelling with edema and regional mass-effect.  Right to left shift increased from 4 mm to 6 mm.  No hydrocephalus.  A few new subcentimeter acute left cerebral infarcts  4/19 MRA brain IMPRESSION: 1. Large subacute right MCA infarct, increased from 09/30/2018 MRI but similar in appearance to yesterday's CT. Associated petechial hemorrhage without malignant hemorrhagic transformation. 2. 10 mm leftward midline shift, unchanged from yesterday's CT. 3. Multiple new small acute infarcts in the left cerebral hemisphere,  corpus callosum, medulla, and right cerebellum. 4. Motion degraded head MRA without large vessel occlusion. Questionable stenoses as above which may be artifactual.  Micro Data:  4/9 MRSA PCR >> neg  4/12 BC x 2 >> no growth 4/12 UC >> neg  4/12 trach  asp >> mod gpc, few gnr >>> nl flora  4/15 Trach asp >>> mod wbc, few gpc >> 4/15 Resp panel by PCR >  Neg  4/15 Covid-19  Neg  4/21 blood culture x2-negative to date  Antimicrobials:  4/9 ancef preop Unaysn 4/12-4/16  Interim history/subjective:  Febrile TMax 100.2   on pressure support without distress  Objective   Blood pressure (!) 157/78, pulse 87, temperature 99 F (37.2 C), resp. rate (!) 22, height 6' (1.829 m), weight 108.9 kg, SpO2 100 %.  RA    Vent Mode: PRVC FiO2 (%):  [30 %] 30 % Set Rate:  [18 bmp] 18 bmp Vt Set:  [161[620 mL] 620 mL PEEP:  [5 cmH20] 5 cmH20 Plateau Pressure:  [18 cmH20-23 cmH20] 20 cmH20   Intake/Output Summary (Last 24 hours) at 11-Jul-2018 0836 Last data filed at 11-Jul-2018 0800 Gross per 24 hour  Intake 649.92 ml  Output 2160 ml  Net -1510.08 ml   Filed Weights   10/08/18 0500 10/09/18 0500 09/25/2018 0458  Weight: 102.4 kg 102.7 kg 108.9 kg   Physical Exam: Chronically ill, orally intubated, following commands Clear breath sounds bilaterally S1-S2 appreciated  abdomen soft, bowel sounds appreciated No edema, no clubbing Not following commands   Resolved Hospital Problem list    Assessment & Plan:   Right MCA M1 occlusion status post EVAR with successful revascularization Deteriorating neurological status, MRI noted Palliative care assisting with goals of care CVA management per neurology  Acute respiratory failure, requiring reintubation Inability to protect airway Aspiration pneumonia Completed Unasyn Continue pulmonary toileting ABG noted  Acute kidney injury Creatinine improving Trend BMP Avoid nephrotoxic's  Hypernatremia Continue to trend electrolytes Continue free water  RUE edema  Phlebitis? edema and skin sloughing S/p course of abx for ?cellulitis  Plan: Wound care Warm compress Vascular checks  Atrial fibrillation Xarelto on hold at present Continue amiodarone Continue telemetry monitoring   History of chronic systolic and diastolic heart failure with last ejection fraction of 55% Hold Entresto for acute kidney injury  Hypertension Continue Coreg, Isordil, hydralazine PRN labetalol  DM2  Home medications on hold P:  Monitor blood sugars SSI Lantus 25 units daily  Fever May be central fever Follow cultures Continue to hold antibiotics at present  Ongoing goals of care discussions Family coming in today at 10 AM One-way extubation after family comes in to spend time with him  Prognosis currently is grim  Best practice:  Diet: Hold enteral nutrition Pain/Anxiety/Delirium protocol (if indicated): PRN fentanyl, Versed VAP protocol (if indicated): yes   DVT prophylaxis: SCDs, subcu heparin GI prophylaxis: PPI Glucose control: SSI/Lantus Mobility: BR Code Status: Full Family Communication: Family communication per Neurology, palliative care services Disposition: ICU   Labs   CBC: Recent Labs  Lab 10/05/18 0626 10/06/18 0649 10/06/18 1827 10/08/18 0551 10/09/18 0547 10/10/18 1201  WBC 8.6 8.4 9.3 12.9* 12.6*  --   NEUTROABS  --   --  7.0  --   --   --   HGB 9.0* 7.9* 7.2* 7.9* 7.9* 8.5*  HCT 31.8* 27.0* 25.2* 27.5* 26.0* 25.0*  MCV 106.7* 107.6* 106.8* 105.4* 104.0*  --   PLT 153 143* 187 249 234  --  Basic Metabolic Panel: Recent Labs  Lab 10/04/18 1104  10/05/18 0626  10/06/18 0649  10/07/18 0029 10/08/18 0551 10/08/18 1821 10/09/18 0547 10/10/18 1201  NA 157*   < > 155*   < > 152*   < > 148* 148* 147* 148* 145  K 3.8  --  4.0  --  4.4  --   --  4.5  --  5.4* 5.2*  CL 123*  --  120*  --  117*  --   --  110  --  111  --   CO2 28  --  25  --  23  --   --  26  --  25  --   GLUCOSE 230*  --  172*  --  145*  --   --  142*  --  94  --   BUN 36*  --  28*  --  36*  --   --  54*  --  50*  --   CREATININE 1.86*  --  1.62*  --  1.79*  --   --  2.33*  --  1.75*  --   CALCIUM 7.9*  --  8.3*  --  8.3*  --   --  8.3*  --  8.5*  --   MG  --   --    --   --  3.0*  --   --   --   --   --   --    < > = values in this interval not displayed.   GFR: Estimated Creatinine Clearance: 65.8 mL/min (A) (by C-G formula based on SCr of 1.75 mg/dL (H)). Recent Labs  Lab 10/06/18 0649 10/06/18 1827 10/08/18 0551 10/09/18 0547  WBC 8.4 9.3 12.9* 12.6*    Liver Function Tests: No results for input(s): AST, ALT, ALKPHOS, BILITOT, PROT, ALBUMIN in the last 168 hours. No results for input(s): LIPASE, AMYLASE in the last 168 hours. No results for input(s): AMMONIA in the last 168 hours.  ABG    Component Value Date/Time   PHART 7.461 (H) 10/10/2018 1201   PCO2ART 41.3 10/10/2018 1201   PO2ART 81.0 (L) 10/10/2018 1201   HCO3 29.9 (H) 10/10/2018 1201   TCO2 31 10/10/2018 1201   ACIDBASEDEF 2.0 09/30/2018 0624   O2SAT 97.0 10/10/2018 1201     Fatin Bachicha, MD

## 2018-10-19 NOTE — Progress Notes (Signed)
STROKE TEAM PROGRESS NOTE   INTERVAL HISTORY Pt wife, sister, daughter and uncle are at bedside. Pt still unresponsive, still has fever overnight 100.2. answered all family questions and reviewed neuro images with them. Family is ready for comfort care. Discussed with Dr. Neale Burly in the hallway. Appreciate Dr. Onnie Boer help.   Vitals:   10/08/2018 0600 09/30/2018 0700 10/16/2018 0800 10/09/2018 0818  BP: 114/61 134/68 (!) 157/78 (!) 157/78  Pulse: 83 82 87   Resp: 18 18 20  (!) 22  Temp: 99.5 F (37.5 C) 99 F (37.2 C) 99 F (37.2 C)   TempSrc:      SpO2: 99% 100% 100% 100%  Weight:      Height:        CBC:  Recent Labs  Lab 10/06/18 1827 10/08/18 0551 10/09/18 0547 10/10/18 1201  WBC 9.3 12.9* 12.6*  --   NEUTROABS 7.0  --   --   --   HGB 7.2* 7.9* 7.9* 8.5*  HCT 25.2* 27.5* 26.0* 25.0*  MCV 106.8* 105.4* 104.0*  --   PLT 187 249 234  --     Basic Metabolic Panel:  Recent Labs  Lab 10/06/18 0649  10/08/18 0551  10/09/18 0547 10/10/18 1201  NA 152*   < > 148*   < > 148* 145  K 4.4  --  4.5  --  5.4* 5.2*  CL 117*  --  110  --  111  --   CO2 23  --  26  --  25  --   GLUCOSE 145*  --  142*  --  94  --   BUN 36*  --  54*  --  50*  --   CREATININE 1.79*  --  2.33*  --  1.75*  --   CALCIUM 8.3*  --  8.3*  --  8.5*  --   MG 3.0*  --   --   --   --   --    < > = values in this interval not displayed.    IMAGING past 24h No results found.   PHYSICAL EXAM: General -obese middle-aged African-American male who is intubated but not on sedation. Right forearm and hand lesions with hand swelling and bandage.   Cardiovascular - Regular rate and rhythm, not in afib. Neurological Exam - Intubated on ventilation, not on sedation.  Comatose unresponsive. Eyes mid position, disconjugate eyes with spontaneous rolling, pupil bilateral 2.5 mm, reactive to light. No blinking to visual threat bilaterally. Corneal reflex weak bilaterally, gag and cough present. Facial symmetry not able to  test due to ET tube.  Tongue midline in mouth.  No spontaneous extremity movements.  Extensor posturing of the left > right upper extremities to sternal rub.  No movement in bilateral lower extremities. DTR diminished and  bilateral babinski negative. Sensation and coordination not cooperative and gait not tested.   ASSESSMENT/PLAN Mr. DEWITTE KROGSTAD is a 48 y.o. 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 Early infarct R BG and insula.    CTA head & neck ELO R ICA, R M1, R M2.  R A1 clot.   Cerebral angio R M1 TICI2b reperfusion  CT head hyperdensity R BG, contrast vs hmg. No mass effect  MRI  Large R MCA infarct, associated edema and mass effect w/o shift. prob small R cerebellar infarct. Small vessel disease.   MRA  Normal, previous R M1 open  2D Echo EF 60-65%. No source of embolus   Neurologic worsening 10/02/18, decreased LOC and sonorous breathing w/ aphasia likely due to inability to protect airway and slight increase in cytotoxic edema.    CT head - R MCA R BG, perisylvian frontal and temporal lobes stable. Stable petechial hemorrhage. No new stroke. Increased edema and mass effect 10 mm R to L shift and effacement R lat ventricle (was 6 mm)  CTA head - no LVO, patent circ  CTA neck - no LVO or sign stenosis. Increased ground-glass opacification upper R lung  Further neurological worsening 10/06/2018 following temperature spike and blood pressure elevation.  Follow-up CT shows increased right frontal and parietal densities likely extension recent infarct  CT head - large R MCA infarct increased in size w/  extension into R parietal and frontal love. Stable mass effect w/ 10 mm shift and effacement R ventricle.   MRI 4/20 large subacute R MCA infarct w/ petechial HT. 10 mm L shift. Mult new small infarcts L cerebral, corpus callosum, medulla and R cerebellum  MRA - motion decraded w/o LVO  EEG no sz  LDL 73  HgbA1c 8.9  Poor neuro prognosis. I had long discussion with wife, daughter and uncle at bedside, updated pt current condition, reviewed images and discussed poor prognosis. Answered all their questions and they are ready for comfort care measures.    Code status DNR  Palliative care on board - help appreciated  Cerebral edema  Induced hypernatremia  09/28/2018 pt with right ptosis, right pupil dilation with reserved reflex, right gaze sustained nystagmus -> 09/29/2018 bilateral proptosis, minimal extraocular movement bilaterally, pupil equal sluggish to light, bilateral facial weakness, dysphagia  MRI right temporal pole edema with cerebral peduncle compression  CT repeat 09/29/2018- Increasing regional mass effect and edema with associated 4 mm right-to-left shift.   MRI repeat increased MLS to 23mm, brainstem compression stable  MRI repeat 10/07/2018 shows increased size of right hemispheric infarct with persistent cytotoxic edema and midline shift   Off 3% saline now  Na 153->150->148->148  Cardiac Arrest 10/02/18  While in CT 4/14 for decreased LOC and respiratory compromise  epinephine & compressions  stabilized  Acute on Chronic Respiratory Failure w/ hypoxia and hypercapnia  OSA  Initial intubation in ED for IR  Extubated 4/10  reintubated 4/11 d/t increased WOB  Extubated 4/14  reintubated 4/15 d/t diminished cough and gag, snoring  CCM onboard  Trach procedure canceled  Atrial Fibrillation w/ RVR & VT  Home anticoagulation:  Xarelto (rivaroxaban) daily  . Home meds: amiodarone 200  Chronic combined systolic and diastolic CHF   Home meds: Coreg  37.5 bid, lasix 60 bid, hydralazine 50 bid, isosorbide 30, entresto bid, spironolactone 25  CXR September 28, 2018 lungs clear  Fever and leukocytosis  Tmax 103.2->100.2  WBC 9.3->12.9->12.6  Blood culture no growth    urine culture no growth   resp culture normal respiratory flora  CXR  Improving airspace R lung base. Resolved right pleural effusion  abx course completed, off abx  CCM on board  Dysphagia  Secondary to stroke  PEG placement procedure canceled  Hypertension  Stable   Home meds:  Coreg  37.5 bid, lasix 60 bid, hydralazine 50 bid, isosorbide 30, entresto bid, spironolactone 25  BP goal < 180  Off cleviprex . Long-term BP goal normotensive  Hyperlipidemia  Home meds:  No statin  LDL 73, goal < 70  Diabetes type II uncontrolled  Home meds:  Metformin 500 bid, insulin degludec 8u  HgbA1c 8.9, goal < 7.0  Hyperglycemia improved  CCM on board  Other Stroke Risk Factors  Obesity, Body mass index is 32.56 kg/m., recommend weight loss, diet and exercise as appropriate   Obstructive sleep apnea  Nonischemic CM  Other Active Problems  AKI on CKD stage II Cr 1.79->2.33->1.75  Normocytic Anemia 12.5->10.2->9.0>7.9->7.2->7.9, MCV 105.5 - VB 12 and folate normal  Thrombocytopenia 241->151->153->249->234  Hyperkalemia, K 5.4  Dorsal RUE hand and forearm w/ bulbous lesion  Hospital day # 14  I spent  35 minutes in total face-to-face time with the patient, more than 50% of which was spent in counseling and coordination of care, reviewing test results, images and medication, and discussing the diagnosis of right MCA large infarct, cerebral edema, afib, unresponsive, pneumonia, poor prognosis. This patient's care requiresreview of multiple databases, neurological assessment, discussion with family, other specialists and medical decision making of high complexity. Answered all family questions and reviewed neuro images with them. Family is ready for comfort  care.   Marvel PlanJindong Tita Terhaar, MD PhD Stroke Neurology 02-Apr-2019 9:51 AM    To contact Stroke Continuity provider, please refer to [email or web address]. After hours, contact General Neurology

## 2018-10-19 NOTE — Plan of Care (Signed)
Pt passed away at 1329. Death certificate signed and gave to RN.   Marvel Plan, MD PhD Stroke Neurology 09/23/2018 2:52 PM

## 2018-10-19 NOTE — Progress Notes (Signed)
Chaplain received page from nurse. Patient deceased. Family bedside.  Chaplain offered prayer and words of comfort and ministry of presence. Rev. Lynnell Chad Pager [phone]

## 2018-10-19 NOTE — Death Summary Note (Signed)
Stroke Discharge Summary  Patient ID: Johnathan Archer   MRN: [id number]      DOB: 03-13-1971  Date of Admission: 2018/10/25 Date of Death: 2018-11-08 at 1329  Attending Physician:  Marvel Plan, MD, Stroke MD Consultant(s):   Cyril Mourning, MD (pulmonary/intensive care), Harriette Bouillon, MD (trauma for trach/PEG), Melody Eliberto Ivory, RN (WOC), Mack Hook, MD (orthopedic surgery), Rosalin Hawking, MD (palliative care) Patient's PCP:  Caroll Rancher, NP  DISCHARGE DIAGNOSIS:  Principal Problem:   Ischemic stroke [facility]) Active Problems:   Type II diabetes mellitus, uncontrolled (HCC)   OSA (obstructive sleep apnea)   Nonischemic cardiomyopathy (HCC)   Long term current use of anticoagulant therapy   Atrial fibrillation with rapid ventricular response (HCC)   Obesity (BMI 30-39.9)   History of noncompliance with medical treatment   Chronic combined systolic and diastolic CHF (congestive heart failure) (HCC)   Normocytic anemia   Middle cerebral artery embolism, right   HCAP (healthcare-associated pneumonia)   Acute hypernatremia   Essential hypertension   Acute on chronic respiratory failure with hypoxia and hypercapnia (HCC)   Aspiration into respiratory tract   Endotracheal tube present   Palliative care by specialist   Goals of care, counseling/discussion   Past Medical History:  Diagnosis Date  . Atrial fibrillation with RVR (HCC) 10/2011   lone episode, converted with IV dilt, on coumadin given elevated CHADS2  . Chest pain 03/06/2015   Myoview 07/2018: EF 33, no ischemia (EF normal by recent echo)  . Chronic combined systolic and diastolic CHF (congestive heart failure) (HCC)    Echo 2014: EF 20-25 // Echo 07/2018: mild LVE, EF 55, normal RVSF, mod LAE, mod MR, mild PI, mild aortic root dilation (38 mm)  . ED (erectile dysfunction)   . History of chicken pox   . History of noncompliance with medical treatment 03/06/2015  . Hypertension   . Hypertensive heart disease with  heart failure (HCC) 03/28/2016  . Hyperthyroidism 03/15/2016  . Long term current use of anticoagulant therapy 01/14/2013  . Mitral regurgitation    Moderate by echo 07/2011  . Nonischemic cardiomyopathy (HCC) 12/27/2012  . Normocytic anemia 03/15/2016  . Obesity (BMI 30-39.9) 01/21/2014  . OSA (obstructive sleep apnea)   . Paroxysmal atrial fibrillation (HCC) 10/19/2011   lone episode, converted with IV dilt, on coumadin given elevated CHADS2   . 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  cath 2004 negative for obstruction, last echo 07/2011 EF 35%. b) Normal coronary arteries by cath in 2004 (when EF was noted at 15%). c) Last EF 20% by echo 12/2012  . Type II diabetes mellitus, uncontrolled (HCC)   . Type II or unspecified type diabetes mellitus without mention of complication, uncontrolled   . Warfarin anticoagulation    Past Surgical History:  Procedure Laterality Date  . CARDIAC CATHETERIZATION  2004   no blockages  . HERNIA REPAIR  2002  . IR ANGIO VERTEBRAL SEL SUBCLAVIAN INNOMINATE UNI R MOD SED  Oct 25, 2018  . IR CT HEAD LTD  2018-10-25  . IR PERCUTANEOUS ART THROMBECTOMY/INFUSION INTRACRANIAL INC DIAG ANGIO  10-25-2018  . KNEE SURGERY     L knee in HS  . RADIOLOGY WITH ANESTHESIA [name] 10/25/2018   Procedure: IR WITH ANESTHESIA;  Surgeon: Julieanne Cotton, MD;  Location: [facility] OR;  Service: Radiology;  Laterality: [name];  . US ECHOCARDIOGRAPHY  07/2011   mod LVH, EF 35%, diffuse  hypokinesis of entire myocardium, irreversible restrictive pattern (grade 4 diastolic dysfunction), mod MR, mod dilated LA/RA, decreased RV systolic fxn, no PFO  . US ECHOCARDIOGRAPHY  12/2012   severe LVH, EF 20-25%, mod MR, reduced RV systolic function   No Known Allergies  LABORATORY STUDIES CBC    Component Value Date/Time   WBC 12.6 (H) 10/09/2018 0547   RBC 2.50 (L) 10/09/2018 0547   HGB 8.5 (L) 10/10/2018 1201   HCT 25.0 (L)  10/10/2018 1201   HCT 31.5 (L) 10/03/2018 0630   PLT 234 10/09/2018 0547   MCV 104.0 (H) 10/09/2018 0547   MCH 31.6 10/09/2018 0547   MCHC 30.4 10/09/2018 0547   RDW 13.5 10/09/2018 0547   LYMPHSABS 1.3 10/06/2018 1827   MONOABS 0.9 10/06/2018 1827   EOSABS 0.1 10/06/2018 1827   BASOSABS 0.0 10/06/2018 1827   CMP    Component Value Date/Time   NA 145 10/10/2018 1201   NA 140 08/17/2018 1117   K 5.2 (H) 10/10/2018 1201   CL 111 10/09/2018 0547   CO2 25 10/09/2018 0547   GLUCOSE 94 10/09/2018 0547   BUN 50 (H) 10/09/2018 0547   BUN 16 08/17/2018 1117   CREATININE 1.75 (H) 10/09/2018 0547   CREATININE 1.20 05/18/2016 1012   CALCIUM 8.5 (L) 10/09/2018 0547   PROT 7.3 09/24/2018 1248   ALBUMIN 3.3 (L) 09/26/2018 1248   AST 19 09/26/2018 1248   ALT 22 10/02/2018 1248   ALKPHOS 50 10/07/2018 1248   BILITOT 1.7 (H) 10/13/2018 1248   GFRNONAA 45 (L) 10/09/2018 0547   GFRAA 52 (L) 10/09/2018 0547   COAGS Lab Results  Component Value Date   INR 1.2 10/08/2018   INR 1.2 10/06/2018   INR 1.2 10/05/2018   Lipid Panel    Component Value Date/Time   CHOL 134 09/28/2018 0224   TRIG 91 09/30/2018 0428   HDL 53 09/28/2018 0224   CHOLHDL 2.5 09/28/2018 0224   VLDL 8 09/28/2018 0224   LDLCALC 73 09/28/2018 0224   HgbA1C  Lab Results  Component Value Date   HGBA1C 8.9 (H) 09/28/2018   Urinalysis    Component Value Date/Time   COLORURINE YELLOW 10/09/2018 1100   APPEARANCEUR CLOUDY (A) 10/09/2018 1100   LABSPEC 1.021 10/09/2018 1100   PHURINE 5.0 10/09/2018 1100   GLUCOSEU NEGATIVE 10/09/2018 1100   HGBUR SMALL (A) 10/09/2018 1100   BILIRUBINUR NEGATIVE 10/09/2018 1100   BILIRUBINUR neg 10/04/2013 1635   KETONESUR NEGATIVE 10/09/2018 1100   PROTEINUR 30 (A) 10/09/2018 1100   UROBILINOGEN 0.2 10/04/2013 1635   UROBILINOGEN 0.2 07/06/2013 0947   NITRITE NEGATIVE 10/09/2018 1100   LEUKOCYTESUR TRACE (A) 10/09/2018 1100   Urine Drug Screen     Component Value  Date/Time   LABOPIA NONE DETECTED 09/28/2018 0230   COCAINSCRNUR NONE DETECTED 09/28/2018 0230   COCAINSCRNUR NEGATIVE 11/10/2011 0739   LABBENZ NONE DETECTED 09/28/2018 0230   LABBENZ NEGATIVE 11/10/2011 0739   AMPHETMU NONE DETECTED 09/28/2018 0230   THCU NONE DETECTED 09/28/2018 0230   LABBARB NONE DETECTED 09/28/2018 0230    Alcohol Level    Component Value Date/Time   ETH <10 10/02/2018 1248    SIGNIFICANT DIAGNOSTIC STUDIES Ct Angio Head W Or Wo Contrast  Result Date: 10/02/2018 CLINICAL DATA:  48 y/o M; follow-up of stroke post intra-arterial revascularization. EXAM: CTA HEAD WITHOUT CONTRAST CT ANGIOGRAPHY HEAD AND NECK TECHNIQUE: Multidetector CT imaging of the head and neck was performed using the standard protocol during bolus  administration of intravenous contrast. Multiplanar CT image reconstructions and MIPs were obtained to evaluate the vascular anatomy. Carotid stenosis measurements (when applicable) are obtained utilizing NASCET criteria, using the distal internal carotid diameter as the denominator. CONTRAST:  OMNIPAQUE IOHEXOL 350 MG/ML SOLN COMPARISON:  09/30/2018 MRI head. 10/02/2018 CTA head and neck. 10/16/2018 cerebral angiogram. 09/28/2018 MRA head. FINDINGS: CT HEAD FINDINGS Brain: Large right MCA distribution infarction involving the right basal ganglia, right insula, right lateral frontal lobe, and right temporal lobe with additional small foci of infarction throughout the right superior frontal and parietal cortices. The distribution of infarction is stable from prior MRI of the head given differences in technique. Edema and mass effect results in effacement of the right lateral ventricle and 10 mm of right-to-left midline shift. There are faint densities throughout the region of infarction corresponding to petechial hemorrhage on the prior MRI of the head without interval change given differences in technique. No new acute stroke, mass effect, or downward  herniation at this time. Additional very small infarctions scattered throughout the cerebral hemispheres seen on the prior MRI are not appreciable on CT. Vascular: As below. Skull: Normal. Negative for fracture or focal lesion. Sinuses: Mild mucosal thickening of ethmoid, sphenoid, and maxillary sinuses. Normal aeration of the mastoid air cells. Orbits are unremarkable. Orbits: No acute finding. Review of the MIP images confirms the above findings CTA NECK FINDINGS Aortic arch: Standard branching. Imaged portion shows no evidence of aneurysm or dissection. No significant stenosis of the major arch vessel origins. Right carotid system: Motion artifact obscures the carotid bifurcation. No evidence of dissection, stenosis (50% or greater) or occlusion in the visible carotid system. Left carotid system: Motion artifact obscures the carotid bifurcation. No evidence of dissection, stenosis (50% or greater) or occlusion in the visible carotid system. Vertebral arteries: Codominant. Segments of the vertebral arteries are obscured by motion artifact. No evidence of dissection, stenosis (50% or greater) or occlusion of the visible vertebral artery systems. Skeleton: Negative. Other neck: Negative. Upper chest: Increased ground-glass opacities within the right upper lung. Review of the MIP images confirms the above findings CTA HEAD FINDINGS Anterior circulation: No significant stenosis, proximal occlusion, aneurysm, or vascular malformation. Posterior circulation: No significant stenosis, proximal occlusion, aneurysm, or vascular malformation. Venous sinuses: As permitted by contrast timing, patent. Anatomic variants: None significant. Delayed phase: No abnormal intracranial enhancement. Review of the MIP images confirms the above findings IMPRESSION: CT head: 1. Stroke in the right MCA distribution predominantly within the right basal ganglia and perisylvian frontal and temporal lobes is stable in distribution in comparison  with the prior MRI given differences in technique. Stable petechial hemorrhage. No new stroke or hemorrhage identified. 2. Increased edema and mass effect with 10 mm right-to-left midline shift and effacement of right lateral ventricle, previously 6 mm right-to-left midline shift. No downward herniation at this time. CTA neck: 1. Patent carotid and vertebral arteries. No dissection, aneurysm, or hemodynamically significant stenosis utilizing NASCET criteria. 2. Increased ground-glass opacification in the upper right lung, suspected neurogenic edema or possibly pneumonitis. CTA head: 1. Patent anterior and posterior intracranial circulation. No new large vessel occlusion, aneurysm, or significant stenosis. Electronically Signed   By: Mitzi Hansen M.D.   On: 10/02/2018 23:41   Ct Angio Head W Or Wo Contrast  Result Date: 10/18/2018 CLINICAL DATA:  Stroke.  Left-sided weakness EXAM: CT ANGIOGRAPHY HEAD AND NECK TECHNIQUE: Multidetector CT imaging of the head and neck was performed using the standard protocol during bolus administration  of intravenous contrast. Multiplanar CT image reconstructions and MIPs were obtained to evaluate the vascular anatomy. Carotid stenosis measurements (when applicable) are obtained utilizing NASCET criteria, using the distal internal carotid diameter as the denominator. CONTRAST:  75mL OMNIPAQUE IOHEXOL 350 MG/ML SOLN COMPARISON:  CT head 10-02-2018 FINDINGS: CTA NECK FINDINGS Aortic arch: Standard branching. Imaged portion shows no evidence of aneurysm or dissection. No significant stenosis of the major arch vessel origins. Right carotid system: Right carotid system widely patent. No stenosis or atherosclerotic disease Left carotid system: Left carotid system widely patent. No stenosis or atherosclerotic disease Vertebral arteries: Both vertebral arteries are widely patent without significant stenosis or atherosclerotic disease. Skeleton: No acute skeletal abnormality.  Other neck: Negative for mass or soft tissue swelling in the neck. Upper chest: Lung apices clear bilaterally. Review of the MIP images confirms the above findings CTA HEAD FINDINGS Anterior circulation: Right cavernous carotid patent. There is occlusion of the supraclinoid internal carotid artery just proximal to the bifurcation. Clot extends into the right middle cerebral artery M1 which is occluded. There is little flow in right MCA branches. Small amount of clot in the proximal right A1 segment which shows good flow possibly from the left side. Both anterior cerebral arteries are patent Left cavernous carotid widely patent. Left anterior and middle cerebral arteries widely patent without stenosis. Posterior circulation: No significant stenosis, proximal occlusion, aneurysm, or vascular malformation. 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 Poor flow in right MCA branches. Small amount of clot in right A1 segment. Findings compatible with acute embolus. No significant atherosclerotic disease in the neck or head. These results were called by telephone at the time of interpretation on 02-Oct-2018 at 1:19 pm to Dr. Arther Dames , who verbally acknowledged these results. Electronically Signed   By: Marlan Palau M.D.   On: 02-Oct-2018 13:21   Dg Abd 1 View  Result Date: 2018-10-02 CLINICAL DATA:  Orogastric tube placement EXAM: ABDOMEN - 1 VIEW COMPARISON:  None. FINDINGS: Tip and side port of the orogastric tube project over the stomach. The tip is likely at the gastroduodenal junction. Nonobstructive bowel gas pattern. IMPRESSION: Orogastric tube tip and side port project over the stomach. Electronically Signed   By: Deatra Robinson M.D.   On: 02-Oct-2018 19:23   Ct Head Wo  Contrast  Result Date: 10/06/2018 CLINICAL DATA:  48 y/o  M; stroke for follow-up. EXAM: CT HEAD WITHOUT CONTRAST TECHNIQUE: Contiguous axial images were obtained from the base of the skull through the vertex without intravenous contrast. COMPARISON:  10/02/2018 CT head and CTA head. 09/30/2018 MRI of the head. FINDINGS: Brain: Large multifocal infarction in the right MCA distribution appears increased in distribution when compared with region of diffusion signal abnormality on the prior MRI of the head. There is greater and more confluent extension of infarct inferiorly into the parietal lobe and anteriorly into the frontal lobe. Faint petechial hemorrhage within the right basal ganglia is stable, no gross hemorrhage. Mass effect is stable with 10 mm of right-to-left midline shift and effacement of the right lateral ventricle. No extra-axial collection, hydrocephalus, or herniation. Vascular: No hyperdense vessel or unexpected calcification. Skull: Normal. Negative for fracture or focal lesion. Sinuses/Orbits: Moderate paranasal sinus mucosal thickening, probably due to nasogastric tube. Other: None. IMPRESSION: 1. Large  right MCA distribution infarction appears increased in distribution when compared with the prior MRI of the head diffusion signal abnormality with greater and more confluent extension of infarct inferiorly into the right parietal lobe and anteriorly into the frontal lobe. 2. Stable mass effect with 10 mm right-to-left midline shift and effacement of right lateral ventricle. 3. Faint petechial hemorrhage within the right basal ganglia is stable, no gross hemorrhage. These results were called by telephone at the time of interpretation on 10/06/2018 at 6:02 am to Dr. Caryl Pina , who verbally acknowledged these results. Electronically Signed   By: Mitzi Hansen M.D.   On: 10/06/2018 06:05   Ct Head Wo Contrast  Result Date: 10/02/2018 CLINICAL DATA:  48 y/o M; follow-up of stroke  post intra-arterial revascularization. EXAM: CTA HEAD WITHOUT CONTRAST CT ANGIOGRAPHY HEAD AND NECK TECHNIQUE: Multidetector CT imaging of the head and neck was performed using the standard protocol during bolus administration of intravenous contrast. Multiplanar CT image reconstructions and MIPs were obtained to evaluate the vascular anatomy. Carotid stenosis measurements (when applicable) are obtained utilizing NASCET criteria, using the distal internal carotid diameter as the denominator. CONTRAST:  OMNIPAQUE IOHEXOL 350 MG/ML SOLN COMPARISON:  09/30/2018 MRI head. 10/05/2018 CTA head and neck. 10/01/2018 cerebral angiogram. 09/28/2018 MRA head. FINDINGS: CT HEAD FINDINGS Brain: Large right MCA distribution infarction involving the right basal ganglia, right insula, right lateral frontal lobe, and right temporal lobe with additional small foci of infarction throughout the right superior frontal and parietal cortices. The distribution of infarction is stable from prior MRI of the head given differences in technique. Edema and mass effect results in effacement of the right lateral ventricle and 10 mm of right-to-left midline shift. There are faint densities throughout the region of infarction corresponding to petechial hemorrhage on the prior MRI of the head without interval change given differences in technique. No new acute stroke, mass effect, or downward herniation at this time. Additional very small infarctions scattered throughout the cerebral hemispheres seen on the prior MRI are not appreciable on CT. Vascular: As below. Skull: Normal. Negative for fracture or focal lesion. Sinuses: Mild mucosal thickening of ethmoid, sphenoid, and maxillary sinuses. Normal aeration of the mastoid air cells. Orbits are unremarkable. Orbits: No acute finding. Review of the MIP images confirms the above findings CTA NECK FINDINGS Aortic arch: Standard branching. Imaged portion shows no evidence of aneurysm or  dissection. No significant stenosis of the major arch vessel origins. Right carotid system: Motion artifact obscures the carotid bifurcation. No evidence of dissection, stenosis (50% or greater) or occlusion in the visible carotid system. Left carotid system: Motion artifact obscures the carotid bifurcation. No evidence of dissection, stenosis (50% or greater) or occlusion in the visible carotid system. Vertebral arteries: Codominant. Segments of the vertebral arteries are obscured by motion artifact. No evidence of dissection, stenosis (50% or greater) or occlusion of the visible vertebral artery systems. Skeleton: Negative. Other neck: Negative. Upper chest: Increased ground-glass opacities within the right upper lung. Review of the MIP images confirms the above findings CTA HEAD FINDINGS Anterior circulation: No significant stenosis, proximal occlusion, aneurysm, or vascular malformation. Posterior circulation: No significant stenosis, proximal occlusion, aneurysm, or vascular malformation. Venous sinuses: As permitted by contrast timing, patent. Anatomic variants: None significant. Delayed phase: No abnormal intracranial enhancement. Review of the MIP images confirms the above findings IMPRESSION: CT head: 1. Stroke in the right MCA distribution predominantly within the right basal ganglia and perisylvian frontal and temporal lobes is stable in  distribution in comparison with the prior MRI given differences in technique. Stable petechial hemorrhage. No new stroke or hemorrhage identified. 2. Increased edema and mass effect with 10 mm right-to-left midline shift and effacement of right lateral ventricle, previously 6 mm right-to-left midline shift. No downward herniation at this time. CTA neck: 1. Patent carotid and vertebral arteries. No dissection, aneurysm, or hemodynamically significant stenosis utilizing NASCET criteria. 2. Increased ground-glass opacification in the upper right lung, suspected neurogenic  edema or possibly pneumonitis. CTA head: 1. Patent anterior and posterior intracranial circulation. No new large vessel occlusion, aneurysm, or significant stenosis. Electronically Signed   By: Mitzi Hansen M.D.   On: 10/02/2018 23:41   Ct Head Wo Contrast  Result Date: 09/29/2018 CLINICAL DATA:  Follow-up examination for acute stroke. EXAM: CT HEAD WITHOUT CONTRAST TECHNIQUE: Contiguous axial images were obtained from the base of the skull through the vertex without intravenous contrast. COMPARISON:  Prior MRI from 09/28/2018 as well as prior CT from 09/28/18. FINDINGS: Brain: Continued interval evolution of acute right MCA territory infarct involving the right basal ganglia, insula, and adjacent right temporal lobe. Patchy small volume right parietal involvement. Associated faint petechial hemorrhage at the right basal ganglia/insular region without hemorrhagic transformation, unchanged from previous MRI. Localized edema with regional mass effect. Right lateral ventricle is partially effaced. Associated 4 mm right-to-left shift at the level of the septum pellucidum. No hydrocephalus or ventricular trapping. Basilar cisterns remain patent. Additional previously noted punctate left frontal cortical infarct not seen by CT. Small chronic right occipital and probable cerebellar infarcts again noted. No new large vessel territory infarct. No hemorrhage elsewhere within the brain. No extra-axial fluid collection. Vascular: No hyperdense vessel. Skull: Scalp soft tissues and calvarium within normal limits. Sinuses/Orbits: Globes and orbital soft tissues normal. Scattered mucosal thickening throughout the ethmoidal air cells, right sphenoid sinus, and right maxillary sinus. Superimposed air-fluid levels noted within the right sphenoid and maxillary sinuses. Nasogastric tube in place within the right nasal cavity. Mastoid air cells are clear. Other: None. IMPRESSION: 1. Continued normal expected interval  evolution of moderate right MCA territory infarct, stable in size and distribution as compared to previous MRI. Associated small volume petechial hemorrhage without hemorrhagic transformation, stable. 2. Increasing regional mass effect and edema with associated 4 mm right-to-left shift. No hydrocephalus or ventricular trapping. 3. Otherwise stable head CT, with no other new acute intracranial abnormality. Electronically Signed   By: Rise Mu M.D.   On: 09/29/2018 02:32   Ct Head Wo Contrast  Result Date: 2018/09/28 CLINICAL DATA:  Stroke follow-up EXAM: CT HEAD WITHOUT CONTRAST TECHNIQUE: Contiguous axial images were obtained from the base of the skull through the vertex without intravenous contrast. COMPARISON:  CTA head neck 09/28/18 FINDINGS: Brain: There is hyperdensity within the right basal ganglia. No midline shift. Mild mass effect on the right lateral ventricle. Left hemisphere is normal. Vascular: Intravascular enhancement secondary to recent contrast administration. Skull: The visualized skull base, calvarium and extracranial soft tissues are normal. Sinuses/Orbits: No fluid levels or advanced mucosal thickening of the visualized paranasal sinuses. No mastoid or middle ear effusion. The orbits are normal. IMPRESSION: Hyperdensity within the right basal ganglia may indicate contrast staining or petechial hemorrhage. No significant mass effect. Electronically Signed   By: Deatra Robinson M.D.   On: 2018-09-28 16:33   Ct Angio Neck W Or Wo Contrast  Result Date: 10/02/2018 CLINICAL DATA:  48 y/o M; follow-up of stroke post intra-arterial revascularization. EXAM: CTA HEAD WITHOUT CONTRAST CT  ANGIOGRAPHY HEAD AND NECK TECHNIQUE: Multidetector CT imaging of the head and neck was performed using the standard protocol during bolus administration of intravenous contrast. Multiplanar CT image reconstructions and MIPs were obtained to evaluate the vascular anatomy. Carotid stenosis measurements  (when applicable) are obtained utilizing NASCET criteria, using the distal internal carotid diameter as the denominator. CONTRAST:  OMNIPAQUE IOHEXOL 350 MG/ML SOLN COMPARISON:  09/30/2018 MRI head. 10/07/2018 CTA head and neck. 09/26/2018 cerebral angiogram. 09/28/2018 MRA head. FINDINGS: CT HEAD FINDINGS Brain: Large right MCA distribution infarction involving the right basal ganglia, right insula, right lateral frontal lobe, and right temporal lobe with additional small foci of infarction throughout the right superior frontal and parietal cortices. The distribution of infarction is stable from prior MRI of the head given differences in technique. Edema and mass effect results in effacement of the right lateral ventricle and 10 mm of right-to-left midline shift. There are faint densities throughout the region of infarction corresponding to petechial hemorrhage on the prior MRI of the head without interval change given differences in technique. No new acute stroke, mass effect, or downward herniation at this time. Additional very small infarctions scattered throughout the cerebral hemispheres seen on the prior MRI are not appreciable on CT. Vascular: As below. Skull: Normal. Negative for fracture or focal lesion. Sinuses: Mild mucosal thickening of ethmoid, sphenoid, and maxillary sinuses. Normal aeration of the mastoid air cells. Orbits are unremarkable. Orbits: No acute finding. Review of the MIP images confirms the above findings CTA NECK FINDINGS Aortic arch: Standard branching. Imaged portion shows no evidence of aneurysm or dissection. No significant stenosis of the major arch vessel origins. Right carotid system: Motion artifact obscures the carotid bifurcation. No evidence of dissection, stenosis (50% or greater) or occlusion in the visible carotid system. Left carotid system: Motion artifact obscures the carotid bifurcation. No evidence of dissection, stenosis (50% or greater) or occlusion in the  visible carotid system. Vertebral arteries: Codominant. Segments of the vertebral arteries are obscured by motion artifact. No evidence of dissection, stenosis (50% or greater) or occlusion of the visible vertebral artery systems. Skeleton: Negative. Other neck: Negative. Upper chest: Increased ground-glass opacities within the right upper lung. Review of the MIP images confirms the above findings CTA HEAD FINDINGS Anterior circulation: No significant stenosis, proximal occlusion, aneurysm, or vascular malformation. Posterior circulation: No significant stenosis, proximal occlusion, aneurysm, or vascular malformation. Venous sinuses: As permitted by contrast timing, patent. Anatomic variants: None significant. Delayed phase: No abnormal intracranial enhancement. Review of the MIP images confirms the above findings IMPRESSION: CT head: 1. 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 Stable petechial hemorrhage. No new stroke or hemorrhage identified. 2. Increased edema and mass effect with 10 mm right-to-left midline shift and effacement of right lateral ventricle, previously 6 mm right-to-left midline shift. No downward herniation at this time. CTA neck: 1. Patent carotid and vertebral arteries. No dissection, aneurysm, or hemodynamically significant stenosis utilizing NASCET criteria. 2. Increased ground-glass opacification in the upper right lung, suspected neurogenic edema or possibly pneumonitis. CTA head: 1. Patent anterior and posterior intracranial circulation. No new large vessel occlusion, aneurysm, or significant stenosis. Electronically Signed   By: Mitzi Hansen M.D.   On: 10/02/2018 23:41   Ct Angio Neck W Or Wo Contrast  Result Date: 10/07/2018 CLINICAL DATA:  Stroke.  Left-sided weakness EXAM: CT ANGIOGRAPHY  HEAD AND  NECK TECHNIQUE: Multidetector CT imaging of the head and neck was performed using the standard protocol during bolus administration of intravenous contrast. Multiplanar CT image reconstructions and MIPs were obtained to evaluate the vascular anatomy. Carotid stenosis measurements (when applicable) are obtained utilizing NASCET criteria, using the distal internal carotid diameter as the denominator. CONTRAST:  75mL OMNIPAQUE IOHEXOL 350 MG/ML SOLN COMPARISON:  CT head 10/12/2018 FINDINGS: CTA NECK FINDINGS Aortic arch: Standard branching. Imaged portion shows no evidence of aneurysm or dissection. No significant stenosis of the major arch vessel origins. Right carotid system: Right carotid system widely patent. No stenosis or atherosclerotic disease Left carotid system: Left carotid system widely patent. No stenosis or atherosclerotic disease Vertebral arteries: Both vertebral arteries are widely patent without significant stenosis or atherosclerotic disease. Skeleton: No acute skeletal abnormality. Other neck: Negative for mass or soft tissue swelling in the neck. Upper chest: Lung apices clear bilaterally. Review of the MIP images confirms the above findings CTA HEAD FINDINGS Anterior circulation: Right cavernous carotid patent. There is occlusion of the supraclinoid internal carotid artery just proximal to the bifurcation. Clot extends into the right middle cerebral artery M1 which is occluded. There is little flow in right MCA branches. Small amount of clot in the proximal right A1 segment which shows good flow possibly from the left side. Both anterior cerebral arteries are patent Left cavernous carotid widely patent. Left anterior and middle cerebral arteries widely patent without stenosis. Posterior circulation: No significant stenosis, proximal occlusion, aneurysm, or vascular malformation. 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 Poor flow in right MCA branches. Small amount of clot in right A1 segment. Findings compatible with acute embolus. No significant atherosclerotic disease in the neck or head. These results were called by telephone at the time of interpretation on 10/04/2018 at 1:19 pm to Dr. Arther Dames , who verbally acknowledged these results. Electronically Signed   By: Marlan Palau M.D.   On: 10/07/2018 13:21   Mr Maxine Glenn Head Wo Contrast  Result Date: 10/07/2018 CLINICAL DATA:  Follow-up right MCA infarct. Right M1 occlusion status post endovascular revascularization. EXAM: MRI HEAD WITHOUT CONTRAST MRA HEAD WITHOUT CONTRAST TECHNIQUE: Multiplanar, multiecho pulse sequences of the brain and surrounding structures were obtained without intravenous contrast. Angiographic images of the head were obtained using MRA technique without contrast. COMPARISON:  Head CT 10/06/2018, CTA 10/02/2018, MRI 09/30/2018, and MRA 09/28/2018 FINDINGS: MRI HEAD FINDINGS Brain: A large early subacute right MCA infarct has progressed from the prior MRI, particularly in the posterior frontal lobe, parietal lobe, and superior and lateral occipital lobe, but it is similar in appearance to yesterday's CT. There is associated petechial hemorrhage throughout the areas of infarction, confluent in the right basal ganglia and right temporal lobes though without evidence of malignant hemorrhagic transformation. There is associated edema with partial effacement of the right lateral ventricle and 9 mm of leftward midline shift, progressed from the prior MRI but unchanged from the more recent CT. Additionally, compared to the prior MRI there are multiple new small acute infarcts in the left cerebral hemisphere with many in a parasagittal orientation in the centrum semiovale and corona radiata as well as  other cortical and subcortical infarcts in the frontal, parietal, and temporal lobes potentially reflecting watershed infarcts or emboli.  There are also new small acute infarcts involving the genu of the corpus callosum, right cerebellar hemisphere, and anterior left medulla. Signal abnormality in the right cerebral peduncle may reflect acute wallerian degeneration. Small chronic infarcts are again noted in the right occipital lobe and right cerebellum. There is no extra-axial fluid collection. Vascular: Major intracranial vascular flow voids are preserved. Skull and upper cervical spine: No suspicious calvarial lesion. Diffusely diminished bone marrow T1 signal intensity in the cervical spine likely related to known anemia. Sinuses/Orbits: Unremarkable orbits. Extensive mucosal thickening in the paranasal sinuses with multiple scattered fluid levels as well as large mastoid effusions in the setting of intubation. Other: None. MRA HEAD FINDINGS The study is motion degraded. The visualized distal vertebral arteries are widely patent to the basilar and codominant. Patent left PICA, bilateral AICAs, and bilateral SCAs are visualized. There is suggestion of a severe proximal left AICA stenosis which was not present on the prior MRA and may instead be artifactual. The basilar artery is widely patent. There are patent posterior communicating arteries bilaterally. The PCAs are patent with suggestion of a new severe stenosis near the right P1-P2 junction, however there is motion artifact in this region which may be contributing to the appearance. The internal carotid arteries are patent from skull base to carotid termini without evidence of significant stenosis allowing for motion artifact which is most notable through the right supraclinoid ICA. ACAs and MCAs are patent without evidence of proximal branch occlusion or significant A1 stenosis. There is no significant proximal M1 stenosis, however assessment of the distal M1  and proximal M2 segments is limited by motion artifact. No sizable intracranial aneurysm is identified. IMPRESSION: 1. Large subacute right MCA infarct, increased from 09/30/2018 MRI but similar in appearance to yesterday's CT. Associated petechial hemorrhage without malignant hemorrhagic transformation. 2. 10 mm leftward midline shift, unchanged from yesterday's CT. 3. Multiple new small acute infarcts in the left cerebral hemisphere, corpus callosum, medulla, and right cerebellum. 4. Motion degraded head MRA without large vessel occlusion. Questionable stenoses as above which may be artifactual. Electronically Signed   By: Sebastian Ache M.D.   On: 10/07/2018 14:02   Mr Maxine Glenn Head Wo Contrast  Result Date: 09/28/2018 CLINICAL DATA:  Initial evaluation for acute stroke, found to have right M1 occlusion, status post catheter directed revascularization. EXAM: MRI HEAD WITHOUT CONTRAST MRA HEAD WITHOUT CONTRAST TECHNIQUE: Multiplanar, multiecho pulse sequences of the brain and surrounding structures were obtained without intravenous contrast. Angiographic images of the head were obtained using MRA technique without contrast. COMPARISON:  Prior CTs from 10-20-2018 FINDINGS: MRI HEAD FINDINGS Brain: Cerebral volume within normal limits for age. Mild for age chronic microvascular ischemic changes present within the periventricular white matter. Small focus of encephalomalacia at the lateral right cerebellum likely related to prior ischemia. Restricted diffusion seen involving the right frontal, temporal, and parietal lobes, compatible with acute right MCA territory infarct. Involvement of the right basal ganglia and internal capsule, with patchy involvement of the right centrum semi ovale. Associated regional mass effect with mild attenuation of the right lateral ventricle. No significant midline shift at this time. No hydrocephalus or ventricular trapping. Basilar cisterns are patent. Scattered petechial hemorrhage within  the area of infarction, primarily involving the basal ganglia and insular/subinsular region. No frank hemorrhagic transformation. Additional punctate acute ischemic nonhemorrhagic cortical infarct noted at the anterior left frontal lobe (series 5, image 88). No other evidence for acute or subacute ischemia. Gray-white matter differentiation otherwise maintained. No other evidence  for acute or chronic intracranial hemorrhage. No mass lesion, midline shift or mass effect. No extra-axial fluid collection. Pituitary gland suprasellar region normal. Midline structures intact. Vascular: Major intracranial vascular flow voids are maintained. Skull and upper cervical spine: Craniocervical junction normal. Upper cervical spine within normal limits. Bone marrow signal intensity normal. No scalp soft tissue abnormality. Sinuses/Orbits: Globes and orbital soft tissues within normal limits. Moderate mucosal thickening throughout the ethmoidal air cells, right sphenoid sinus, and right greater than left maxillary sinuses. Fluid seen within the nasopharynx. Patient likely intubated. Trace opacity noted within the bilateral mastoid air cells. Inner ear structures normal. Other: None. MRA HEAD FINDINGS ANTERIOR CIRCULATION: Distal cervical segments of the internal carotid arteries are patent with symmetric antegrade flow. Petrous, cavernous, and supraclinoid segments patent without flow-limiting stenosis. A1 segments patent bilaterally. Normal anterior communicating artery. Anterior cerebral arteries patent to their distal aspects without stenosis. M1 segments widely patent bilaterally. Previously seen right M1 occlusion has been revascularized. Normal MCA bifurcations. Symmetric and robust perfusion seen within the MCA branches bilaterally. POSTERIOR CIRCULATION: Vertebral arteries patent to the vertebrobasilar junction without stenosis. Patent left PICA. Right PICA not seen. Basilar patent to its distal aspect without stenosis.  Superior cerebral arteries patent bilaterally. Both of the posterior cerebral arteries supplied via the basilar as well small posterior communicating arteries. PCAs widely patent to their distal aspects. No intracranial aneurysm. IMPRESSION: MRI HEAD IMPRESSION: 1. Large acute right MCA territory infarct as above. Associated edema and mild regional mass effect without significant midline shift. Scattered small volume petechial hemorrhage without associated hemorrhagic transformation. 2. 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 3. Underlying mild chronic small vessel ischemic disease. MRA HEAD IMPRESSION: Normal intracranial MRA. Previously seen right M1 occlusion has been cleared, with wide patency and robust perfusion now seen throughout the right MCA and its branches. Electronically Signed   By: Rise Mu M.D.   On: 09/28/2018 01:33   Mr Brain Wo Contrast  Result Date: 10/07/2018 CLINICAL DATA:  Follow-up right MCA infarct. Right M1 occlusion status post endovascular revascularization. EXAM: MRI HEAD WITHOUT CONTRAST MRA HEAD WITHOUT CONTRAST TECHNIQUE: Multiplanar, multiecho pulse sequences of the brain and surrounding structures were obtained without intravenous contrast. Angiographic images of the head were obtained using MRA technique without contrast. COMPARISON:  Head CT 10/06/2018, CTA 10/02/2018, MRI 09/30/2018, and MRA 09/28/2018 FINDINGS: MRI HEAD FINDINGS Brain: A large early subacute right MCA infarct has progressed from the prior MRI, particularly in the posterior frontal lobe, parietal lobe, and superior and lateral occipital lobe, but it is similar in appearance to yesterday's CT. There is associated petechial hemorrhage throughout the areas of infarction, confluent in the right basal ganglia and right temporal lobes though without evidence of malignant hemorrhagic transformation. There is associated edema with partial effacement of the right lateral ventricle and 9 mm of  leftward midline shift, progressed from the prior MRI but unchanged from the more recent CT. Additionally, compared to the prior MRI there are multiple new small acute infarcts in the left cerebral hemisphere with many in a parasagittal orientation in the centrum semiovale and corona radiata as well as other cortical and subcortical infarcts in the frontal, parietal, and temporal lobes potentially reflecting watershed infarcts or emboli. There are also new small acute infarcts involving the genu of the corpus callosum, right cerebellar hemisphere, and anterior left medulla. Signal abnormality in the right cerebral peduncle may reflect acute wallerian degeneration. Small chronic infarcts are again noted in the right occipital lobe and right  cerebellum. There is no extra-axial fluid collection. Vascular: Major intracranial vascular flow voids are preserved. Skull and upper cervical spine: No suspicious calvarial lesion. Diffusely diminished bone marrow T1 signal intensity in the cervical spine likely related to known anemia. Sinuses/Orbits: Unremarkable orbits. Extensive mucosal thickening in the paranasal sinuses with multiple scattered fluid levels as well as large mastoid effusions in the setting of intubation. Other: None. MRA HEAD FINDINGS The study is motion degraded. The visualized distal vertebral arteries are widely patent to the basilar and codominant. Patent left PICA, bilateral AICAs, and bilateral SCAs are visualized. There is suggestion of a severe proximal left AICA stenosis which was not present on the prior MRA and may instead be artifactual. The basilar artery is widely patent. There are patent posterior communicating arteries bilaterally. The PCAs are patent with suggestion of a new severe stenosis near the right P1-P2 junction, however there is motion artifact in this region which may be contributing to the appearance. The internal carotid arteries are patent from skull base to carotid termini  without evidence of significant stenosis allowing for motion artifact which is most notable through the right supraclinoid ICA. ACAs and MCAs are patent without evidence of proximal branch occlusion or significant A1 stenosis. There is no significant proximal M1 stenosis, however assessment of the distal M1 and proximal M2 segments is limited by motion artifact. No sizable intracranial aneurysm is identified. IMPRESSION: 1. Large subacute right MCA infarct, increased from 09/30/2018 MRI but similar in appearance to yesterday's CT. Associated petechial hemorrhage without malignant hemorrhagic transformation. 2. 10 mm leftward midline shift, unchanged from yesterday's CT. 3. Multiple new small acute infarcts in the left cerebral hemisphere, corpus callosum, medulla, and right cerebellum. 4. Motion degraded head MRA without large vessel occlusion. Questionable stenoses as above which may be artifactual. Electronically Signed   By: Sebastian Ache M.D.   On: 10/07/2018 14:02   Mr Brain Wo Contrast  Result Date: 09/30/2018 CLINICAL DATA:  Follow-up examination for acute stroke. EXAM: MRI HEAD WITHOUT CONTRAST TECHNIQUE: Multiplanar, multiecho pulse sequences of the brain and surrounding structures were obtained without intravenous contrast. COMPARISON:  Comparison made with prior CT from 09/29/2018 and previous MRI from 09/28/2018. FINDINGS: Brain: Continued interval evolution of large right MCA territory infarct involving the right frontal, temporal, and occipital lobes, with prominent involvement of the right basal ganglia. Overall, distribution relatively stable from previous. Associated scattered petechial hemorrhage throughout the area of infarction has increased from previous. Additionally, associated gyral swelling and edema with regional mass effect also continues to increase, with increased mass effect on the right lateral ventricle as compared to previous MRI. Right-to-left shift now measures 6 mm, previously  4 mm on prior head CT. No hydrocephalus or ventricular trapping. Mild basilar cistern crowding without effacement. Previously noted punctate left frontal cortical infarct again noted. Few additional scattered subcentimeter acute ischemic infarcts now seen within the left frontal lobe (series 5, images 79, 78, 81, 87), not definitely seen on previous. Few additional small punctate infarcts noted along the anterior genu of the corpus callosum (series 5, image 75). No significant mass effect or hemorrhage. Small chronic right occipital and right cerebellar infarcts again noted. Remainder the brain is otherwise stable in appearance with no new finding. Vascular: Major intracranial vascular flow voids maintained. Skull and upper cervical spine: Craniocervical junction normal. Upper cervical spine normal. No focal marrow replacing lesion. Scalp soft tissues unremarkable. Sinuses/Orbits: Globes and orbital soft tissues within normal limits. Moderate mucosal thickening throughout the paranasal sinuses,  overall worse on the right. Nasogastric tube partially visualized. Small right greater than left mastoid effusions. Other: None. IMPRESSION: 1. Continued interval evolution of large right MCA territory infarct, overall stable in distribution from previous MRI. Associated petechial hemorrhage has increased from previous without frank hemorrhagic transformation. 2. Increasing regional mass effect with slightly worsened 6 mm of right-to-left midline shift. No hydrocephalus or ventricular trapping. 3. Few additional new subcentimeter acute ischemic nonhemorrhagic infarcts involving the left cerebral hemisphere as above. 4. Otherwise stable brain MRI with small chronic right occipital and cerebellar infarcts. No other new acute intracranial abnormality. Electronically Signed   By: Rise Mu M.D.   On: 09/30/2018 06:53   Mr Brain Wo Contrast  Result Date: 09/28/2018 CLINICAL DATA:  Initial evaluation for acute  stroke, found to have right M1 occlusion, status post catheter directed revascularization. EXAM: MRI HEAD WITHOUT CONTRAST MRA HEAD WITHOUT CONTRAST TECHNIQUE: Multiplanar, multiecho pulse sequences of the brain and surrounding structures were obtained without intravenous contrast. Angiographic images of the head were obtained using MRA technique without contrast. COMPARISON:  Prior CTs from 10-12-18 FINDINGS: MRI HEAD FINDINGS Brain: Cerebral volume within normal limits for age. Mild for age chronic microvascular ischemic changes present within the periventricular white matter. Small focus of encephalomalacia at the lateral right cerebellum likely related to prior ischemia. Restricted diffusion seen involving the right frontal, temporal, and parietal lobes, compatible with acute right MCA territory infarct. Involvement of the right basal ganglia and internal capsule, with patchy involvement of the right centrum semi ovale. Associated regional mass effect with mild attenuation of the right lateral ventricle. No significant midline shift at this time. No hydrocephalus or ventricular trapping. Basilar cisterns are patent. Scattered petechial hemorrhage within the area of infarction, primarily involving the basal ganglia and insular/subinsular region. No frank hemorrhagic transformation. Additional punctate acute ischemic nonhemorrhagic cortical infarct noted at the anterior left frontal lobe (series 5, image 88). No other evidence for acute or subacute ischemia. Gray-white matter differentiation otherwise maintained. No other evidence for acute or chronic intracranial hemorrhage. No mass lesion, midline shift or mass effect. No extra-axial fluid collection. Pituitary gland suprasellar region normal. Midline structures intact. Vascular: Major intracranial vascular flow voids are maintained. Skull and upper cervical spine: Craniocervical junction normal. Upper cervical spine within normal limits. Bone marrow signal  intensity normal. No scalp soft tissue abnormality. Sinuses/Orbits: Globes and orbital soft tissues within normal limits. Moderate mucosal thickening throughout the ethmoidal air cells, right sphenoid sinus, and right greater than left maxillary sinuses. Fluid seen within the nasopharynx. Patient likely intubated. Trace opacity noted within the bilateral mastoid air cells. Inner ear structures normal. Other: None. MRA HEAD FINDINGS ANTERIOR CIRCULATION: Distal cervical segments of the internal carotid arteries are patent with symmetric antegrade flow. Petrous, cavernous, and supraclinoid segments patent without flow-limiting stenosis. A1 segments patent bilaterally. Normal anterior communicating artery. Anterior cerebral arteries patent to their distal aspects without stenosis. M1 segments widely patent bilaterally. Previously seen right M1 occlusion has been revascularized. Normal MCA bifurcations. Symmetric and robust perfusion seen within the MCA branches bilaterally. POSTERIOR CIRCULATION: Vertebral arteries patent to the vertebrobasilar junction without stenosis. Patent left PICA. Right PICA not seen. Basilar patent to its distal aspect without stenosis. Superior cerebral arteries patent bilaterally. Both of the posterior cerebral arteries supplied via the basilar as well small posterior communicating arteries. PCAs widely patent to their distal aspects. No intracranial aneurysm. IMPRESSION: MRI HEAD IMPRESSION: 1. Large acute right MCA territory infarct as above. Associated edema and mild  regional mass effect without significant midline shift. Scattered small volume petechial hemorrhage without associated hemorrhagic transformation. 2. 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 3. Underlying mild chronic small vessel ischemic disease. MRA HEAD IMPRESSION: Normal intracranial MRA. Previously seen right M1 occlusion has been cleared, with wide patency and robust perfusion now seen throughout the right MCA  and its branches. Electronically Signed   By: Rise Mu M.D.   On: 09/28/2018 01:33   Ir Ct Head Ltd  Result Date: 10/01/2018 INDICATION: Acute onset of right gaze deviation, left sided hemiplegia. 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 EXAM: 1. EMERGENT LARGE VESSEL OCCLUSION THROMBOLYSIS (anterior CIRCULATION) COMPARISON:  CT angiogram of the head and neck of 2018-09-29. MEDICATIONS: Ancef 2 g IV antibiotic was administered within 1 hour of the procedure. ANESTHESIA/SEDATION: General anesthesia. CONTRAST:  Isovue 300 approximately 80 mL. FLUOROSCOPY TIME:  Fluoroscopy Time: 50 minutes 12 seconds (2968 mGy). COMPLICATIONS: None immediate. TECHNIQUE: Following a full explanation of the procedure along with the potential associated complications, an informed witnessed consent was obtained from the patient's spouse. The risks of intracranial hemorrhage of 10%, worsening neurological deficit, ventilator dependency, death and inability to revascularize were all reviewed in detail with the patient's wife. The patient was then put under general anesthesia by the Department of Anesthesiology at [facility]. The right groin was prepped and draped in the usual sterile fashion. Thereafter using modified Seldinger technique, transfemoral access into the right common femoral artery was obtained without difficulty. Over a 0.035 inch guidewire a 5 French Pinnacle sheath was inserted. Through this, and also over a 0.035 inch guidewire a 5 [place] JB 1 catheter was advanced to the aortic arch region and selectively positioned in the innominate artery and the right common carotid artery. FINDINGS: Innominate artery arteriogram demonstrates the origins of the right subclavian artery and the right common carotid artery to be widely patent. The right vertebral artery origin is patent. The vessel is seen to opacify to the cranial skull base to opacify the right vertebrobasilar  junction. The opacified portions of the basilar artery on the lateral projection, and of the superior cerebellar arteries and the posterior cerebral arteries demonstrates wide patency. The right common carotid arteriogram demonstrates the right external carotid artery and its major branches to be widely patent. The right internal carotid artery at the bulb has a mild stenosis without evidence of intraluminal filling defects or of ulcerations. More distally, the right internal carotid artery opacifies to the cranial skull base. The petrous, cavernous and the supraclinoid segments are widely patent. The right posterior communicating artery is seen opacifying the right posterior cerebral artery distributions. The right anterior cerebral artery opacifies into the capillary and venous phases. Cross-filling via the anterior communicating artery of the left anterior cerebral artery A2 segment is seen transiently. The right middle cerebral artery demonstrates complete occlusion just distal to its origin. Presence of faint filling of the lateral lenticulostriate arteries is noticeable. PROCEDURE: The diagnostic JB 1 catheter in right common carotid artery was exchanged over a 300 cm J-tip Rosen exchange guidewire for an 8 French 55 cm Brite tip neurovascular sheath using biplane roadmap technique and constant fluoroscopic guidance. Good aspiration was obtained from the hub of the neurovascular sheath. This was then connected to continuous heparinized saline infusion. Over the [employer] guidewire, an 8 [place] 85 cm FlowGate guide catheter which had been prepped with 50% contrast and 50% heparinized saline infusion was advanced and positioned in  the right common carotid artery bifurcation. The guidewire was removed. Good aspiration was obtained from the hub of the [facility] guide catheter. Over a 0.035 inch Roadrunner guidewire, using biplane roadmap technique, the FlowGate guide catheter was advanced to the right ICA  cervical segment. The guidewire was removed. Good aspiration was obtained from the hub of the [facility] guide catheter. A gentle control arteriogram demonstrated no evidence of vasospasm, or of intraluminal filling defects. No change was seen in the intracranial circulation. Over a 0.014 inch Softip Synchro micro guidewire, a combination of a 6 French 132 cm Catalyst guide catheter inside of which was a 150 cm 021 Trevo ProVue microcatheter was advanced to the supraclinoid right ICA. The micro guidewire was then gently manipulated with a torque device and advanced into the M2 M3 region of the inferior division followed by the microcatheter without any resistance. The guidewire was removed. Good aspiration obtained from the hub of the microcatheter. Gentle control arteriogram performed through the microcatheter demonstrated safe position of tip of the microcatheter. A 5 mm x 33 mm Embotrap retrieval device was then advanced to the distal end of the microcatheter. The proximal and landing zones were defined. The O ring on the delivery microcatheter was loosened. With slight forward gentle traction with the right hand on the delivery micro guidewire, with left hand the delivery microcatheter was retrieved unsheathing the distal and then the proximal retrieval device. A gentle control arteriogram performed through the Catalyst guide catheter which was then advanced to the right MCA M1 segment demonstrates no opacification of the right MCA distal to this. Proximal flow arrest was then initiated in the distal right internal carotid artery by inflating the balloon of the [facility] guide catheter. Thereafter, the combination of the retrieval device, the microcatheter, and the 6 [place] Catalyst guide catheter which was now advanced into the proximal portion of the clot to engage it was retrieved and removed. Aspiration was performed with a 60 mL syringe at the hub of the [facility] guide catheter, and with the Penumbra  aspiration device at the hub of the 6 [place] Catalyst guide catheter. The retrieval device following removal demonstrated a few specks of clot entangled in the retrieval device. Aspirate also contained a few specks of clot. Flow arrest was then reversed as constant aspiration was applied with a 60 mL syringe at the hub of the [facility] guide catheter. A control arteriogram performed through the [facility] guide catheter in the right internal carotid artery demonstrated no change in the intracranial circulation. At this time, a second pass was made again using a combination of a 6 [place] Catalyst guide catheter inside of which was an 021 Trevo ProVue microcatheter over a 0.014 inch Softip Synchro micro guidewire. Access into the occluded right middle cerebral artery was obtained into the M2 M3 region of the inferior division. Again after having verified safe position of the tip of the microcatheter as mentioned above, the 5 mm x 33 mm Embotrap retrieval device was deployed as above. Again with proximal flow arrest, aspiration was then performed after a wait of 2 minutes of aspiration with a 60 mL syringe at the hub of the [facility] guide catheter, and a Penumbra aspiration device at the hub of the 6 [place] Catalyst guide catheter. The retrieval device upon removal again demonstrated a few specks of the larger clots now in the aspirate and also in the retrieval device. Flow reverse was then relieved as mentioned above. Control arteriogram performed through the [facility] guide catheter in  the right internal carotid artery again demonstrated no significant improvement in the right MCA occlusion. This was then followed by 2 additional passes using the same combination as above. After having verified safe position of the tip of the microcatheter this time in the superior division, this time using a 4 mm x 40 mm X Solitaire retrieval device, aspirations were performed with proximal flow arrest in the 60 mL syringe, and also the  Penumbra aspiration device. After the 2 minute aspiration, the combination was retrieved and removed. With the third and the fourth passes, complete revascularization of the occluded right middle cerebral artery was noted following the procedure as mentioned above. Spasm was seen in the supraclinoid right ICA, which responded to 2 aliquots of 25 mcg of nitroglycerin. A TICI 2b revascularization was achieved. Complete patency of the right anterior cerebral artery, and the right posterior communicating artery was maintained during this time. Also the patient's hemodynamic status, and the neurologic status remained stable. No angiographic evidence of intraluminal filling defects, or of extravasation was seen. No mass effect or midline shift was seen. The [facility] guide catheter, and the 8 French neurovascular sheath were then retrieved into the abdominal aorta over a 0.035 inch guidewire and exchanged for an 8 [place] Pinnacle sheath. This in turn was then removed with the successful application of an 8 French Angio-Seal closure device with hemostasis. No evidence of bleeding or hematoma was seen. Distal pulses remained palpable in the dorsalis pedis, and the posterior tibial regions bilaterally at the end of the procedure. An immediate post procedural CT scan of the brain demonstrated right basal ganglia contrast stasis. No mass-effect or midline shift was seen. The patient was then transported to the neuro ICU intubated for further workup. IMPRESSION: Status post endovascular complete revascularization of occluded proximal right middle cerebral artery at its origin with 2 passes with the 5 mm x 33 mm Embotrap retrieval device, and 2 passes with the Solitaire FR 4 mm x 40 mm X retrieval device achieving a TICI 2b revascularization. PLAN: Follow-up in the clinic 4 weeks post discharge. Electronically Signed   By: Julieanne Cotton M.D.   On: 09/28/2018 11:23   Dg Chest Port 1 View  Result Date: 10/09/2018 CLINICAL  DATA:  Respiratory failure. EXAM: PORTABLE CHEST 1 VIEW COMPARISON:  Radiograph of October 06, 2018. FINDINGS: The heart size and mediastinal contours are within normal limits. Both lungs are clear. No pneumothorax or pleural effusion is noted. Endotracheal and feeding tubes are in good position. The visualized skeletal structures are unremarkable. IMPRESSION: Stable support apparatus. No acute cardiopulmonary abnormality seen. Electronically Signed   By: Lupita Raider M.D.   On: 10/09/2018 08:10   Dg Chest Port 1 View  Result Date: 10/06/2018 CLINICAL DATA:  Respiratory failure. EXAM: PORTABLE CHEST 1 VIEW COMPARISON:  10/02/2018 FINDINGS: The patient is rotated to the right. Endotracheal tube terminates 6 cm above the carina. Feeding tube courses into the abdomen with tip not imaged. The cardiac silhouette remains enlarged. Asymmetric interstitial type densities in the right lung have improved. No sizable pleural effusion or pneumothorax is identified. IMPRESSION: Improved lung aeration suggesting improved edema. Electronically Signed   By: Sebastian Ache M.D.   On: 10/06/2018 08:36   Dg Chest Port 1 View  Result Date: 10/03/2018 CLINICAL DATA:  Check tube placement EXAM: PORTABLE CHEST 1 VIEW COMPARISON:  10/02/2018 FINDINGS: Feeding catheter is again noted extending into the stomach. Endotracheal tube is noted in satisfactory position. Cardiac shadow is stable.  Lungs are well aerated. Mild asymmetric edema is noted on the right. IMPRESSION: Endotracheal tube in satisfactory position. Increasing asymmetric edema within the right lung. Electronically Signed   By: Alcide Clever M.D.   On: 10/03/2018 00:46   Dg Chest Port 1 View  Result Date: 10/02/2018 CLINICAL DATA:  Increase shortness of breath. Altered mental status. EXAM: PORTABLE CHEST 1 VIEW COMPARISON:  Earlier same day FINDINGS: 2131 hours. Low lung volumes. The cardio pericardial silhouette is enlarged. There is pulmonary vascular congestion  without overt pulmonary edema. No focal airspace consolidation or substantial pleural effusion. A feeding tube passes into the stomach although the distal tip position is not included on the film. Telemetry leads overlie the chest. IMPRESSION: Low volumes with cardiomegaly and vascular congestion. Electronically Signed   By: Kennith Center M.D.   On: 10/02/2018 21:38   Dg Chest Port 1 View  Result Date: 10/02/2018 CLINICAL DATA:  Respiratory failure. EXAM: PORTABLE CHEST 1 VIEW COMPARISON:  Chest x-ray dated September 30, 2018. FINDINGS: Interval removal of the endotracheal tube. Unchanged feeding tube entering the stomach with the tip below the field of view. Stable cardiomediastinal silhouette. Normal pulmonary vascularity. Improving opacity at the right lung base. No pleural effusion or pneumothorax. No acute osseous abnormality. IMPRESSION: 1. Improving airspace disease at the right lung base. Resolved right pleural effusion. Electronically Signed   By: Obie Dredge M.D.   On: 10/02/2018 07:52   Dg Chest Port 1 View  Result Date: 09/30/2018 CLINICAL DATA:  Initial evaluation for endotracheal tube placement. EXAM: PORTABLE CHEST 1 VIEW COMPARISON:  Prior radiograph from 09/30/2018. FINDINGS: Endotracheal tube in place with tip positioned 8.7 cm above the carina at the superior-mid margin of the clavicles. Feeding tube courses into the abdomen. Stable cardiomegaly. Mediastinal silhouette normal. Lungs hypoinflated. Veiling opacity overlying the right hemidiaphragm compatible with pleural effusion. Associated right basilar opacity, similar to previous. Linear atelectatic changes at the left lung base. Perihilar vascular congestion without overt pulmonary edema. No visible pneumothorax. Osseous structures unchanged. IMPRESSION: 1. Tip of the endotracheal tube 8.7 cm above the carina at the superior-mid margin of the clavicles. 2. Veiling opacity overlying the right hemidiaphragm compatible with pleural  effusion. Associated right basilar opacity could reflect sequelae of infectious or aspiration pneumonitis and/or atelectasis. Electronically Signed   By: Rise Mu M.D.   On: 09/30/2018 04:25   Dg Chest Port 1 View  Result Date: 09/30/2018 CLINICAL DATA:  Respiratory distress. EXAM: PORTABLE CHEST 1 VIEW COMPARISON:  Chest radiograph 10/08/2018 FINDINGS: Endotracheal tube is been removed. Weighted enteric tube tip below the diaphragm not included in the field of view. Lower lung volumes from prior exam. Development of patchy bibasilar opacities, as well as right midlung opacity. Stable cardiomegaly allowing for differences in technique. No large pleural effusion or pneumothorax. IMPRESSION: 1. Lower lung volumes from prior exam. Patchy bibasilar opacities, right greater than left, may be atelectasis, aspiration or pneumonia. 2. Stable cardiomegaly. Electronically Signed   By: Narda Rutherford M.D.   On: 09/30/2018 03:41   Dg Chest Port 1 View  Result Date: 10/09/2018 CLINICAL DATA:  Endotracheal tube EXAM: PORTABLE CHEST 1 VIEW COMPARISON:  10/04/2018 FINDINGS: Endotracheal tube in good position. Interval improvement in bilateral edema. Heart size enlarged. No significant effusion. IMPRESSION: Endotracheal tube in good position Significant improvement in pulmonary edema.  Lungs are now clear. Electronically Signed   By: Marlan Palau M.D.   On: 09/26/2018 18:08   Dg Chest Portable 1 View  Result  Date: 10/12/2018 CLINICAL DATA:  Intubation. History of CHF. Acute intracranial vascular occlusion. EXAM: PORTABLE CHEST 1 VIEW COMPARISON:  03/11/2017. FINDINGS: Endotracheal tube has been inserted, lies 2.2 cm above carina. The heart is markedly enlarged. BILATERAL pulmonary opacities could represent early pneumonia or pulmonary edema. No visible effusion or pneumothorax. Worsening aeration from priors. IMPRESSION: ET tube slightly low, 2.2 cm above carina. BILATERAL pulmonary opacities could  represent early pneumonia or pulmonary edema. Electronically Signed   By: Elsie Stain M.D.   On: 10-12-2018 13:53   Ir Percutaneous Art Thrombectomy/infusion Intracranial Inc Diag Angio  Result Date: 10/01/2018 INDICATION: Acute onset of right gaze deviation, left sided hemiplegia. 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 EXAM: 1. EMERGENT LARGE VESSEL OCCLUSION THROMBOLYSIS (anterior CIRCULATION) COMPARISON:  CT angiogram of the head and neck of Oct 12, 2018. MEDICATIONS: Ancef 2 g IV antibiotic was administered within 1 hour of the procedure. ANESTHESIA/SEDATION: General anesthesia. CONTRAST:  Isovue 300 approximately 80 mL. FLUOROSCOPY TIME:  Fluoroscopy Time: 50 minutes 12 seconds (2968 mGy). COMPLICATIONS: None immediate. TECHNIQUE: Following a full explanation of the procedure along with the potential associated complications, an informed witnessed consent was obtained from the patient's spouse. The risks of intracranial hemorrhage of 10%, worsening neurological deficit, ventilator dependency, death and inability to revascularize were all reviewed in detail with the patient's wife. The patient was then put under general anesthesia by the Department of Anesthesiology at [facility]. The right groin was prepped and draped in the usual sterile fashion. Thereafter using modified Seldinger technique, transfemoral access into the right common femoral artery was obtained without difficulty. Over a 0.035 inch guidewire a 5 French Pinnacle sheath was inserted. Through this, and also over a 0.035 inch guidewire a 5 [place] JB 1 catheter was advanced to the aortic arch region and selectively positioned in the innominate artery and the right common carotid artery. FINDINGS: Innominate artery arteriogram demonstrates the origins of the right subclavian artery and the right common carotid artery to be widely patent. The right vertebral artery origin is patent. The vessel is seen  to opacify to the cranial skull base to opacify the right vertebrobasilar junction. The opacified portions of the basilar artery on the lateral projection, and of the superior cerebellar arteries and the posterior cerebral arteries demonstrates wide patency. The right common carotid arteriogram demonstrates the right external carotid artery and its major branches to be widely patent. The right internal carotid artery at the bulb has a mild stenosis without evidence of intraluminal filling defects or of ulcerations. More distally, the right internal carotid artery opacifies to the cranial skull base. The petrous, cavernous and the supraclinoid segments are widely patent. The right posterior communicating artery is seen opacifying the right posterior cerebral artery distributions. The right anterior cerebral artery opacifies into the capillary and venous phases. Cross-filling via the anterior communicating artery of the left anterior cerebral artery A2 segment is seen transiently. The right middle cerebral artery demonstrates complete occlusion just distal to its origin. Presence of faint filling of the lateral lenticulostriate arteries is noticeable. PROCEDURE: The diagnostic JB 1 catheter in right common carotid artery was exchanged over a 300 cm J-tip Rosen exchange guidewire for an 8 French 55 cm Brite tip neurovascular sheath using biplane roadmap technique and constant fluoroscopic guidance. Good aspiration was obtained from the hub of the neurovascular sheath. This was then connected to continuous heparinized saline infusion. Over the [employer] guidewire, an 8 [place] 85 cm FlowGate  guide catheter which had been prepped with 50% contrast and 50% heparinized saline infusion was advanced and positioned in the right common carotid artery bifurcation. The guidewire was removed. Good aspiration was obtained from the hub of the [facility] guide catheter. Over a 0.035 inch Roadrunner guidewire, using biplane roadmap  technique, the FlowGate guide catheter was advanced to the right ICA cervical segment. The guidewire was removed. Good aspiration was obtained from the hub of the [facility] guide catheter. A gentle control arteriogram demonstrated no evidence of vasospasm, or of intraluminal filling defects. No change was seen in the intracranial circulation. Over a 0.014 inch Softip Synchro micro guidewire, a combination of a 6 French 132 cm Catalyst guide catheter inside of which was a 150 cm 021 Trevo ProVue microcatheter was advanced to the supraclinoid right ICA. The micro guidewire was then gently manipulated with a torque device and advanced into the M2 M3 region of the inferior division followed by the microcatheter without any resistance. The guidewire was removed. Good aspiration obtained from the hub of the microcatheter. Gentle control arteriogram performed through the microcatheter demonstrated safe position of tip of the microcatheter. A 5 mm x 33 mm Embotrap retrieval device was then advanced to the distal end of the microcatheter. The proximal and landing zones were defined. The O ring on the delivery microcatheter was loosened. With slight forward gentle traction with the right hand on the delivery micro guidewire, with left hand the delivery microcatheter was retrieved unsheathing the distal and then the proximal retrieval device. A gentle control arteriogram performed through the Catalyst guide catheter which was then advanced to the right MCA M1 segment demonstrates no opacification of the right MCA distal to this. Proximal flow arrest was then initiated in the distal right internal carotid artery by inflating the balloon of the [facility] guide catheter. Thereafter, the combination of the retrieval device, the microcatheter, and the 6 [place] Catalyst guide catheter which was now advanced into the proximal portion of the clot to engage it was retrieved and removed. Aspiration was performed with a 60 mL syringe at  the hub of the [facility] guide catheter, and with the Penumbra aspiration device at the hub of the 6 [place] Catalyst guide catheter. The retrieval device following removal demonstrated a few specks of clot entangled in the retrieval device. Aspirate also contained a few specks of clot. Flow arrest was then reversed as constant aspiration was applied with a 60 mL syringe at the hub of the [facility] guide catheter. A control arteriogram performed through the [facility] guide catheter in the right internal carotid artery demonstrated no change in the intracranial circulation. At this time, a second pass was made again using a combination of a 6 [place] Catalyst guide catheter inside of which was an 021 Trevo ProVue microcatheter over a 0.014 inch Softip Synchro micro guidewire. Access into the occluded right middle cerebral artery was obtained into the M2 M3 region of the inferior division. Again after having verified safe position of the tip of the microcatheter as mentioned above, the 5 mm x 33 mm Embotrap retrieval device was deployed as above. Again with proximal flow arrest, aspiration was then performed after a wait of 2 minutes of aspiration with a 60 mL syringe at the hub of the [facility] guide catheter, and a Penumbra aspiration device at the hub of the 6 [place] Catalyst guide catheter. The retrieval device upon removal again demonstrated a few specks of the larger clots now in the aspirate and also in the  retrieval device. Flow reverse was then relieved as mentioned above. Control arteriogram performed through the [facility] guide catheter in the right internal carotid artery again demonstrated no significant improvement in the right MCA occlusion. This was then followed by 2 additional passes using the same combination as above. After having verified safe position of the tip of the microcatheter this time in the superior division, this time using a 4 mm x 40 mm X Solitaire retrieval device, aspirations were  performed with proximal flow arrest in the 60 mL syringe, and also the Penumbra aspiration device. After the 2 minute aspiration, the combination was retrieved and removed. With the third and the fourth passes, complete revascularization of the occluded right middle cerebral artery was noted following the procedure as mentioned above. Spasm was seen in the supraclinoid right ICA, which responded to 2 aliquots of 25 mcg of nitroglycerin. A TICI 2b revascularization was achieved. Complete patency of the right anterior cerebral artery, and the right posterior communicating artery was maintained during this time. Also the patient's hemodynamic status, and the neurologic status remained stable. No angiographic evidence of intraluminal filling defects, or of extravasation was seen. No mass effect or midline shift was seen. The [facility] guide catheter, and the 8 French neurovascular sheath were then retrieved into the abdominal aorta over a 0.035 inch guidewire and exchanged for an 8 [place] Pinnacle sheath. This in turn was then removed with the successful application of an 8 French Angio-Seal closure device with hemostasis. No evidence of bleeding or hematoma was seen. Distal pulses remained palpable in the dorsalis pedis, and the posterior tibial regions bilaterally at the end of the procedure. An immediate post procedural CT scan of the brain demonstrated right basal ganglia contrast stasis. No mass-effect or midline shift was seen. The patient was then transported to the neuro ICU intubated for further workup. IMPRESSION: Status post endovascular complete revascularization of occluded proximal right middle cerebral artery at its origin with 2 passes with the 5 mm x 33 mm Embotrap retrieval device, and 2 passes with the Solitaire FR 4 mm x 40 mm X retrieval device achieving a TICI 2b revascularization. PLAN: Follow-up in the clinic 4 weeks post discharge. Electronically Signed   By: Julieanne Cotton M.D.   On:  09/28/2018 11:23   Ct Head Code Stroke Wo Contrast  Result Date: 09/26/2018 CLINICAL DATA:  Code stroke. Left-sided weakness. On Xarelto for atrial fibrillation EXAM: CT HEAD WITHOUT CONTRAST TECHNIQUE: Contiguous axial images were obtained from the base of the skull through the vertex without intravenous contrast. COMPARISON:  CT head 11/14/2017 FINDINGS: Brain: Ill-defined hypodensity right basal ganglia compatible with early infarction involving the inferior basal ganglia, including the putamen and external capsule and possibly the inferior insula. Negative for hemorrhage or mass effect. No midline shift. Ventricle size normal. Vascular: Extensive hyperdense right MCA involving the entire right M1 extending into the right M2. Other vessels negative Skull: Negative Sinuses/Orbits: Mild mucosal edema right maxillary sinus. Normal orbit Multiple dental caries Other: None ASPECTS (Alberta Stroke Program Early CT Score) - Ganglionic level infarction (caudate, lentiform nuclei, internal capsule, insula, M1-M3 cortex): 5 - Supraganglionic infarction (M4-M6 cortex): 3 Total score (0-10 with 10 being normal): 8 IMPRESSION: 1. Hyperdense right MCA compatible with acute embolus. Early infarct in the right basal ganglia and insula 2. ASPECTS is 8 3. These results were called by telephone at the time of interpretation on 10/09/2018 at 1:08 pm to Dr. Laurence Slate , who verbally acknowledged these results. Electronically Signed  By: Marlan Palau M.D.   On: 10/14/2018 13:10   Ir Angio Vertebral Sel Subclavian Innominate Uni R Mod Sed  Result Date: 10/01/2018 INDICATION: Acute onset of right gaze deviation, left sided hemiplegia. 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 EXAM: 1. EMERGENT LARGE VESSEL OCCLUSION THROMBOLYSIS (anterior CIRCULATION) COMPARISON:  CT angiogram of the head and neck of 10/05/2018. MEDICATIONS: Ancef 2 g IV antibiotic was administered within 1 hour of the procedure.  ANESTHESIA/SEDATION: General anesthesia. CONTRAST:  Isovue 300 approximately 80 mL. FLUOROSCOPY TIME:  Fluoroscopy Time: 50 minutes 12 seconds (2968 mGy). COMPLICATIONS: None immediate. TECHNIQUE: Following a full explanation of the procedure along with the potential associated complications, an informed witnessed consent was obtained from the patient's spouse. The risks of intracranial hemorrhage of 10%, worsening neurological deficit, ventilator dependency, death and inability to revascularize were all reviewed in detail with the patient's wife. The patient was then put under general anesthesia by the Department of Anesthesiology at [facility]. The right groin was prepped and draped in the usual sterile fashion. Thereafter using modified Seldinger technique, transfemoral access into the right common femoral artery was obtained without difficulty. Over a 0.035 inch guidewire a 5 French Pinnacle sheath was inserted. Through this, and also over a 0.035 inch guidewire a 5 [place] JB 1 catheter was advanced to the aortic arch region and selectively positioned in the innominate artery and the right common carotid artery. FINDINGS: Innominate artery arteriogram demonstrates the origins of the right subclavian artery and the right common carotid artery to be widely patent. The right vertebral artery origin is patent. The vessel is seen to opacify to the cranial skull base to opacify the right vertebrobasilar junction. The opacified portions of the basilar artery on the lateral projection, and of the superior cerebellar arteries and the posterior cerebral arteries demonstrates wide patency. The right common carotid arteriogram demonstrates the right external carotid artery and its major branches to be widely patent. The right internal carotid artery at the bulb has a mild stenosis without evidence of intraluminal filling defects or of ulcerations. More distally, the right internal carotid artery opacifies to the  cranial skull base. The petrous, cavernous and the supraclinoid segments are widely patent. The right posterior communicating artery is seen opacifying the right posterior cerebral artery distributions. The right anterior cerebral artery opacifies into the capillary and venous phases. Cross-filling via the anterior communicating artery of the left anterior cerebral artery A2 segment is seen transiently. The right middle cerebral artery demonstrates complete occlusion just distal to its origin. Presence of faint filling of the lateral lenticulostriate arteries is noticeable. PROCEDURE: The diagnostic JB 1 catheter in right common carotid artery was exchanged over a 300 cm J-tip Rosen exchange guidewire for an 8 French 55 cm Brite tip neurovascular sheath using biplane roadmap technique and constant fluoroscopic guidance. Good aspiration was obtained from the hub of the neurovascular sheath. This was then connected to continuous heparinized saline infusion. Over the [employer] guidewire, an 8 [place] 85 cm FlowGate guide catheter which had been prepped with 50% contrast and 50% heparinized saline infusion was advanced and positioned in the right common carotid artery bifurcation. The guidewire was removed. Good aspiration was obtained from the hub of the [facility] guide catheter. Over a 0.035 inch Roadrunner guidewire, using biplane roadmap technique, the FlowGate guide catheter was advanced to the right ICA cervical segment. The guidewire was removed. Good aspiration was obtained from the hub of the [facility]  guide catheter. A gentle control arteriogram demonstrated no evidence of vasospasm, or of intraluminal filling defects. No change was seen in the intracranial circulation. Over a 0.014 inch Softip Synchro micro guidewire, a combination of a 6 French 132 cm Catalyst guide catheter inside of which was a 150 cm 021 Trevo ProVue microcatheter was advanced to the supraclinoid right ICA. The micro guidewire was then  gently manipulated with a torque device and advanced into the M2 M3 region of the inferior division followed by the microcatheter without any resistance. The guidewire was removed. Good aspiration obtained from the hub of the microcatheter. Gentle control arteriogram performed through the microcatheter demonstrated safe position of tip of the microcatheter. A 5 mm x 33 mm Embotrap retrieval device was then advanced to the distal end of the microcatheter. The proximal and landing zones were defined. The O ring on the delivery microcatheter was loosened. With slight forward gentle traction with the right hand on the delivery micro guidewire, with left hand the delivery microcatheter was retrieved unsheathing the distal and then the proximal retrieval device. A gentle control arteriogram performed through the Catalyst guide catheter which was then advanced to the right MCA M1 segment demonstrates no opacification of the right MCA distal to this. Proximal flow arrest was then initiated in the distal right internal carotid artery by inflating the balloon of the [facility] guide catheter. Thereafter, the combination of the retrieval device, the microcatheter, and the 6 [place] Catalyst guide catheter which was now advanced into the proximal portion of the clot to engage it was retrieved and removed. Aspiration was performed with a 60 mL syringe at the hub of the [facility] guide catheter, and with the Penumbra aspiration device at the hub of the 6 [place] Catalyst guide catheter. The retrieval device following removal demonstrated a few specks of clot entangled in the retrieval device. Aspirate also contained a few specks of clot. Flow arrest was then reversed as constant aspiration was applied with a 60 mL syringe at the hub of the [facility] guide catheter. A control arteriogram performed through the [facility] guide catheter in the right internal carotid artery demonstrated no change in the intracranial circulation. At this  time, a second pass was made again using a combination of a 6 [place] Catalyst guide catheter inside of which was an 021 Trevo ProVue microcatheter over a 0.014 inch Softip Synchro micro guidewire. Access into the occluded right middle cerebral artery was obtained into the M2 M3 region of the inferior division. Again after having verified safe position of the tip of the microcatheter as mentioned above, the 5 mm x 33 mm Embotrap retrieval device was deployed as above. Again with proximal flow arrest, aspiration was then performed after a wait of 2 minutes of aspiration with a 60 mL syringe at the hub of the [facility] guide catheter, and a Penumbra aspiration device at the hub of the 6 [place] Catalyst guide catheter. The retrieval device upon removal again demonstrated a few specks of the larger clots now in the aspirate and also in the retrieval device. Flow reverse was then relieved as mentioned above. Control arteriogram performed through the [facility] guide catheter in the right internal carotid artery again demonstrated no significant improvement in the right MCA occlusion. This was then followed by 2 additional passes using the same combination as above. After having verified safe position of the tip of the microcatheter this time in the superior division, this time using a 4 mm x 40 mm X Solitaire retrieval  device, aspirations were performed with proximal flow arrest in the 60 mL syringe, and also the Penumbra aspiration device. After the 2 minute aspiration, the combination was retrieved and removed. With the third and the fourth passes, complete revascularization of the occluded right middle cerebral artery was noted following the procedure as mentioned above. Spasm was seen in the supraclinoid right ICA, which responded to 2 aliquots of 25 mcg of nitroglycerin. A TICI 2b revascularization was achieved. Complete patency of the right anterior cerebral artery, and the right posterior communicating artery was  maintained during this time. Also the patient's hemodynamic status, and the neurologic status remained stable. No angiographic evidence of intraluminal filling defects, or of extravasation was seen. No mass effect or midline shift was seen. The [facility] guide catheter, and the 8 French neurovascular sheath were then retrieved into the abdominal aorta over a 0.035 inch guidewire and exchanged for an 8 [place] Pinnacle sheath. This in turn was then removed with the successful application of an 8 French Angio-Seal closure device with hemostasis. No evidence of bleeding or hematoma was seen. Distal pulses remained palpable in the dorsalis pedis, and the posterior tibial regions bilaterally at the end of the procedure. An immediate post procedural CT scan of the brain demonstrated right basal ganglia contrast stasis. No mass-effect or midline shift was seen. The patient was then transported to the neuro ICU intubated for further workup. IMPRESSION: Status post endovascular complete revascularization of occluded proximal right middle cerebral artery at its origin with 2 passes with the 5 mm x 33 mm Embotrap retrieval device, and 2 passes with the Solitaire FR 4 mm x 40 mm X retrieval device achieving a TICI 2b revascularization. PLAN: Follow-up in the clinic 4 weeks post discharge. Electronically Signed   By: Julieanne Cotton M.D.   On: 09/28/2018 11:23    2D Echocardiogram  1. The left ventricle has normal systolic function with an ejection fraction of 60-65%. The cavity size was normal. There is mildly increased left ventricular wall thickness. Left ventricular diastolic parameters were normal. 2. The right ventricle has normal systolic function. The cavity was normal. There is no increase in right ventricular wall thickness. 3. Negative bubble study for right to left shunt. 4. Trivial pericardial effusion is present. 5. Mild thickening of the mitral valve leaflet. 6. The tricuspid valve is grossly  normal. 7. The aortic valve is tricuspid. Mild thickening of the aortic valve. Sclerosis without any evidence of stenosis of the aortic valve.   HISTORY OF PRESENT ILLNESS DOREN KASPAR is a 48 y.o. male with PMHx significant for hypertension, diabetes, CHF, and A. fib was brought to ED via EMS from his work when his coworkers found him on the floor in the restroom with left hemiparesis and right gaze deviation.  He was seen at his baseline 10/12/2018 around 10AM ([facility]). Patient denies any recent illnesses or sick contact.  Is compliant with his medications. In the ED he was allert and following simple command.  Unable to communicate. CT head without any hemorrhage. He was not a tPA candidate as he was on xarelto with last dose morning of admission. CTA showed a R M1 occlusion and was taken to IR for mechanical thrombectomy.    HOSPITAL COURSE Mr. ALANDIS BLUEMEL is a 48 y.o. male with history of HTN, DB, CHF and AF on Xarelto found down at work, presenting with L hemiparesis and R gaze. CTA showed a R M1 occlusion. Taken to IR for mechanical thrombectomy. Large  R brain stroke. At risk for cerebral edema, placed on hypertonic saline. Developed respiratory distress with change in LOC. Taken for stat imaging, during which he had respiratory/cardiac arrest. Treated by EDP in CT. approx 10 days post stroke, developed neuro worsening. Imaging now with new infarcts, embolic off AC. Ongoing progressive neuro decline complicated by ongoing fever, CHF, AF w/ RVR, respiratory failure with recurrent intubations, CKD, HTN, HLD and DB. Neuro prognosis for meaningful recovery poor. After ongoing discussions with wife, she and family decided for one-way extubation and comfort care. They were present at time of death.  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 Early infarct R BG and insula.    CTA head & neck ELO R ICA, R M1, R M2. R A1 clot.   Cerebral angio R M1 TICI2b reperfusion  CT head hyperdensity R BG, contrast vs hmg. No mass effect  MRI  Large R MCA infarct, associated edema and mass effect w/o shift. prob small R cerebellar infarct. Small vessel disease.   MRA  Normal, previous R M1 open  2D Echo EF 60-65%. No source of embolus  Neurologic worsening 10/02/18, decreased LOC and sonorous breathing w/ aphasia likely due to inability to protect airway and slight increase in cytotoxic edema.    CT head - R MCA R BG, perisylvian frontal and temporal lobes stable. Stable petechial hemorrhage. No new stroke. Increased edema and mass effect 10 mm R to L shift and effacement R lat ventricle (was 6 mm)  CTA head - no LVO, patent circ  CTA neck - no LVO or sign stenosis. Increased ground-glass opacification upper R lung Further neurological worsening 10/06/2018 following temperature spike and blood pressure elevation.  Follow-up CT shows increased right frontal and parietal densities likely extension recent infarct  CT head - large R MCA infarct increased in size w/ extension into R parietal and frontal love. Stable mass effect w/ 10 mm shift and effacement R ventricle.   MRI 4/20 large subacute R MCA infarct w/ petechial HT. 10 mm L shift. Mult new small infarcts L cerebral, corpus callosum, medulla and R cerebellum  MRA - motion decraded w/o LVO  EEG no sz  LDL 73  HgbA1c 8.9  Poor neuro prognosis. ongoing discussions between stroke team and palliative care with wife. Wife, after discussion with family opted for one-way extubation.   Day of death:  Wife, daughter and uncle at bedside, MD updated on pt current condition, reviewed images and discussed poor prognosis. Answered all their  questions and they were ready for comfort care measures.    Code status DNR  Patient extubated and died shortly thereafter.   Palliative care supported pt, family and care team. - help appreciated  Cerebral edema  Induced hypernatremia  09/28/2018 pt with right ptosis, right pupil dilation with reserved reflex, right gaze sustained nystagmus -> 09/29/2018 bilateral proptosis, minimal extraocular movement bilaterally, pupil equal sluggish to light, bilateral facial weakness, dysphagia  MRI right temporal pole edema with cerebral peduncle compression  CT repeat 09/29/2018- Increasing regional mass effect and edema with associated 4 mm right-to-left shift.   MRI repeat increased MLS to 6mm, brainstem compression stable  MRI repeat 10/07/2018 shows increased size of right  hemispheric infarct with persistent cytotoxic edema and midline shift   Treated with hypertonic saline protocol  Cardiac Arrest 10/02/18  While in CT 4/14 for decreased LOC and respiratory compromise  epinephine & compressions  stabilized  Acute on Chronic Respiratory Failure w/ hypoxia and hypercapnia  OSA  Initial intubation in ED for IR  Extubated 4/10  reintubated 4/11 d/t increased WOB  Extubated 4/14  reintubated 4/15 d/t diminished cough and gag, snoring  Discussed Trach procedure but canceled d/t progressive worsening  CCM consulted  Atrial Fibrillation w/ RVR & VT  Home anticoagulation:  Xarelto (rivaroxaban) daily   Home meds: amiodarone 200  Chronic combined systolic and diastolic CHF   Home meds: Coreg 37.5 bid, lasix 60 bid, hydralazine 50 bid, isosorbide 30, entresto bid, spironolactone 25  CXR 10/01/2018 lungs clear  Fever and leukocytosis  Tmax 103.2  Blood culture no growth    urine culture no growth   resp culture normal respiratory flora  CXR  Improving airspace R lung base. Resolved right pleural effusion  abx course completed  CCM  consulted  Dysphagia  Secondary to stroke  PEG placement procedure canceled d/t progressive worsening  Hypertension  Home meds:  Coreg 37.5 bid, lasix 60 bid, hydralazine 50 bid, isosorbide 30, entresto bid, spironolactone 25  Treated with cleviprex  Hyperlipidemia  Home meds:  No statin  LDL 73, goal < 70  Diabetes type II uncontrolled  Home meds:  Metformin 500 bid, insulin degludec 8u  HgbA1c 8.9, goal < 7.0  Other Stroke Risk Factors  Obesity, Body mass index is 32.56 kg/m., recommend weight loss, diet and exercise as appropriate   Obstructive sleep apnea  Nonischemic CM  Other Active Problems  AKI on CKD stage II   Normocytic Anemia - VB 12 and folate normal  Thrombocytopenia   Hyperkalemia  Dorsal RUE hand and forearm w/ bulbous lesion  Malnutrition d/t inability to eat from stroke. On tube feedings   DISCHARGE EXAM No heart beat, no respirations.   DISCHARGE PLAN  Disposition:  Transported to morgue by nursing staff  35 minutes were spent preparing discharge.  Annie Main, MSN, APRN, ANVP-BC, AGPCNP-BC Advanced Practice Stroke Nurse [facility] See Amion for Schedule & Pager information 19-Oct-2018 5:21 PM

## 2018-10-19 NOTE — Progress Notes (Signed)
                                                                                                                                                                                                         Daily Progress Note   Patient Name: Johnathan Archer       Date: 10/09/2018 DOB: 04/22/1971  Age: 48 y.o. MRN#: [id number] Attending Physician: Xu, Jindong, MD Primary Care Physician: Gray, Rebecca, NP Admit Date: 10/17/2018  Reason for Consultation/Follow-up: Establishing goals of care  Subjective: Unresponsive.  He has gag reflex and weak cough, he doesn't open eyes, doesn't follow commands, he has ongoing posturing movements, overall, appears neurologically the same as prior documented exams.   Family arrived at bedside and discussed his clinical course as well as plan for comfort and one way extubation.  Family met with Dr. Xu who answered questions and family desires to move forward with comfort care and one way extubation.  Length of Stay: 14  Current Medications: Scheduled Meds:  . mouth rinse  15 mL Mouth Rinse 10 times per day    Continuous Infusions: . sodium chloride Stopped (09/25/2018 1206)  . HYDROmorphone 0.3 mg/hr (09/20/2018 1228)    PRN Meds: acetaminophen **OR** acetaminophen (TYLENOL) oral liquid 160 mg/5 mL **OR** acetaminophen, albuterol, bisacodyl, diphenhydrAMINE, fentaNYL (SUBLIMAZE) injection, glycopyrrolate, hydrALAZINE, HYDROmorphone, labetalol, midazolam, polyvinyl alcohol  Physical Exam Abdominal:     Palpations: Mass:      Generalized edema is noted, worse in UE than LE Abdomen some what distended He has ETT, he has OG tube, on bolus tube feeds Appears chronically ill Regular Vent assisted breaths regular  Vital Signs: BP (!) 171/82   Pulse 91   Temp 99 F (37.2 C)   Resp 18   Ht 6' (1.829 m)   Wt 108.9 kg   SpO2 99%   BMI 32.56 kg/m  SpO2: SpO2: 99 % O2 Device: O2 Device: Ventilator O2 Flow Rate: O2 Flow Rate (L/min): 2 L/min   Intake/output summary:   Intake/Output Summary (Last 24 hours) at 09/30/2018 1349 Last data filed at 10/12/2018 1228 Gross per 24 hour  Intake 643.17 ml  Output 1785 ml  Net -1141.83 ml   LBM: Last BM Date: 10/10/18 Baseline Weight: Weight: 105 kg Most recent weight: Weight: 108.9 kg       Palliative Assessment/Data:    Flowsheet Rows     Most Recent Value  Intake Tab  Referral Department  Neurology  Unit at Time of Referral  ICU  Palliative Care Primary Diagnosis  Neurology  Date   Notified  10/07/18  Palliative Care Type  New Palliative care  Reason for referral  Clarify Goals of Care  Date of Admission  09/19/2018  Date first seen by Palliative Care  10/07/18  # of days Palliative referral response time  0 Day(s)  # of days IP prior to Palliative referral  10  Clinical Assessment  Psychosocial & Spiritual Assessment  Palliative Care Outcomes      Patient Active Problem List   Diagnosis Date Noted  . Palliative care by specialist   . Goals of care, counseling/discussion   . Acute on chronic respiratory failure with hypoxia and hypercapnia (HCC) 10/03/2018  . Aspiration into respiratory tract   . Endotracheal tube present   . HCAP (healthcare-associated pneumonia) 10/02/2018  . Acute hypernatremia 10/02/2018  . Essential hypertension 10/02/2018  . Ischemic stroke (HCC) 10/12/2018  . Middle cerebral artery embolism, right 10/14/2018  . Hypertensive heart disease with heart failure (HCC) 03/28/2016  . Normocytic anemia 03/15/2016  . Hyperthyroidism 03/15/2016  . Chronic combined systolic and diastolic CHF (congestive heart failure) (HCC)   . History of noncompliance with medical treatment 03/06/2015  . Chest pain, atypical 03/06/2015  . Obesity (BMI 30-39.9) 01/21/2014  . Atrial fibrillation with rapid ventricular response (HCC) 07/05/2013  . Long term current use of anticoagulant therapy 01/14/2013  . Nonischemic cardiomyopathy (HCC) 12/27/2012  . Paroxysmal  atrial fibrillation (HCC) 10/19/2011  . ED (erectile dysfunction)   . Type II diabetes mellitus, uncontrolled (HCC)   . OSA (obstructive sleep apnea)     Palliative Care Assessment & Plan   Patient Profile:  48-year-old gentleman with a past medical history significant for atrial fibrillation, chronic combined systolic and diastolic heart failure, diabetes, hypertension, nonischemic cardiomyopathy and obstructive sleep apnea.  Patient was at work when he was found by his coworkers unresponsive in the bathroom. Patient was brought into the hospital and treated as a code stroke with left-sided paralysis and with hypertensive emergency. Initial CT scan of the brain showed acute right MCA occlusion. Patient underwent intubation and mechanical ventilation for airway protection. Patient underwent thrombectomy and successful revascularization.  Patient has remained in the intensive care unit on the ventilator. He is suspected to have had a cardiac arrest event. Repeat imaging of the brain has shown worsening of his recent infarct. Brain imaging also shows increased edema and midline shift.  Initially, plans were in place for proceeding with tracheostomy and PEG tube placement and continued aggressive measures aimed towards stabilization/recovery.   However, patient's clinical course over the past 24-48 hours has only shown ongoing neurological decompensation. In addition, patient has had fluctuating blood pressures, has had a fever today. Patient has worsening peripheral edema. Patient was found to be posturing and left upper extremity to stimulation earlier today.  In light of the patient's ongoing neurological decline, in spite of aggressive life prolonging measures, a palliative consultation has been requested for goals of care discussions.  Assessment:  vent dependent resp failure Neurological decline Recent arrest History of A fib.  History of CHF DM HTN  Recommendations/Plan: Met  with family at bedside, including wife, daughter, sister, and uncle.  We discussed clinical course and plan for extubation and comfort care.  Supported family and answered questions to the best of my ability.  - Plan for comfort with one way extubation.  Discussed with family and orders placed per order set.  Questions and concerns addressed.   PMT will continue to support holistically.  Code Status:  DNR  Prognosis:    Grim   Discharge Planning:  Anticipated Hospital Death  Care plan was discussed with  Patient's RN, patient's wife on phone.   Thank you for allowing the Palliative Medicine Team to assist in the care of this patient.   Time In: 1020 Time Out: 1100 Total Time 40 Prolonged Time Billed  no       Greater than 50%  of this time was spent counseling and coordinating care related to the above assessment and plan.  Micheline Rough, MD  Please contact Palliative Medicine Team phone at [phone] for questions and concerns.

## 2018-10-19 NOTE — Progress Notes (Signed)
   October 13, 2018 1329  Attending Physican Contact  Attending Physician Notified Y  Attending Physician (First and Last Name) Marvel Plan, MD  Will the above attending physician sign death certificate? Yes  Post Mortem Checklist  Date of Death 10-13-2018  Time of Death 1329  Pronounced By Kathaleen Maser, RN & Londell Moh, RN  Next of kin notified Yes  Name of next of kin notified of death Johnathan Archer, wife  Contact Person's Relationship to Patient Spouse  Contact Person's Phone Number [phone]  Contact Person's address [address] [place] [facility] [address]  Family Communication Notes family at bedside for terminal extubation  Was the patient a No Code Blue or a Limited Code Blue? Yes  Did the patient die unattended? No  Patient restrained? Not applicable  Height 6' (1.829 m)  Weight 108.9 kg  Body preparation complete Y  [facility]  Notification Date 10-13-2018  Notification Time 1440  [place] [facility] Number [phone]  Is patient a potential donor? Y  Donation Type Tissue  Eye prep completed Yes  Autopsy  Autopsy requested by [name]  Patient Belongings/Medications Returned  Patient belongings from bedside/safe/pharmacy returned  Yes  Valuables returned to? wife Johnathan Archer valuables returned wedding ring and other ring  Dead on Arrival (Emergency Department)  Patient dead on arrival? No  Notifications  Patient Placement notified that Post Mortem checklist is complete Yes  Patient Placement notified body transferred Transported to [email or web address]  Other Notifications Consulting civil engineer) n/a  Medical Examiner  Is this a medical examiner's case? N  Funeral Home  Funeral home name/address/phone # [facility] not listed  Name/Address/Phone # of [facility] [address] [place] [address] [phone]   Planned location of pickup [place]

## 2018-10-19 DEATH — deceased
